# Patient Record
Sex: Female | Born: 1945 | Race: White | Hispanic: No | Marital: Married | Smoking: Never smoker
Health system: Southern US, Community
[De-identification: ages and names within clinical notes are randomized; demographics above are authoritative.]

## PROBLEM LIST (undated history)

## (undated) DIAGNOSIS — G8929 Other chronic pain: Secondary | ICD-10-CM

## (undated) DIAGNOSIS — E785 Hyperlipidemia, unspecified: Secondary | ICD-10-CM

## (undated) DIAGNOSIS — M549 Dorsalgia, unspecified: Secondary | ICD-10-CM

## (undated) DIAGNOSIS — E119 Type 2 diabetes mellitus without complications: Secondary | ICD-10-CM

## (undated) DIAGNOSIS — F319 Bipolar disorder, unspecified: Secondary | ICD-10-CM

## (undated) DIAGNOSIS — I10 Essential (primary) hypertension: Secondary | ICD-10-CM

## (undated) DIAGNOSIS — H269 Unspecified cataract: Secondary | ICD-10-CM

## (undated) DIAGNOSIS — M810 Age-related osteoporosis without current pathological fracture: Secondary | ICD-10-CM

## (undated) DIAGNOSIS — H409 Unspecified glaucoma: Secondary | ICD-10-CM

## (undated) DIAGNOSIS — E039 Hypothyroidism, unspecified: Secondary | ICD-10-CM

## (undated) DIAGNOSIS — D649 Anemia, unspecified: Secondary | ICD-10-CM

## (undated) HISTORY — DX: Essential (primary) hypertension: I10

## (undated) HISTORY — DX: Anemia, unspecified: D64.9

## (undated) HISTORY — DX: Type 2 diabetes mellitus without complications: E11.9

## (undated) HISTORY — DX: Unspecified cataract: H26.9

## (undated) HISTORY — DX: Hypothyroidism, unspecified: E03.9

## (undated) HISTORY — DX: Other chronic pain: G89.29

## (undated) HISTORY — DX: Hyperlipidemia, unspecified: E78.5

## (undated) HISTORY — DX: Dorsalgia, unspecified: M54.9

## (undated) HISTORY — PX: DILATION AND CURETTAGE OF UTERUS: SHX78

## (undated) HISTORY — PX: BREAST SURGERY: SHX581

## (undated) HISTORY — DX: Age-related osteoporosis without current pathological fracture: M81.0

## (undated) HISTORY — DX: Unspecified glaucoma: H40.9

## (undated) HISTORY — PX: KNEE ARTHROSCOPY: SUR90

## (undated) HISTORY — DX: Bipolar disorder, unspecified: F31.9

---

## 1997-12-30 ENCOUNTER — Other Ambulatory Visit: Admission: RE | Admit: 1997-12-30 | Discharge: 1997-12-30 | Payer: Self-pay

## 1998-07-18 ENCOUNTER — Inpatient Hospital Stay (HOSPITAL_COMMUNITY): Admission: AD | Admit: 1998-07-18 | Discharge: 1998-07-20 | Payer: Self-pay | Admitting: Internal Medicine

## 1998-07-19 ENCOUNTER — Encounter (HOSPITAL_BASED_OUTPATIENT_CLINIC_OR_DEPARTMENT_OTHER): Payer: Self-pay | Admitting: Internal Medicine

## 1998-07-20 ENCOUNTER — Encounter (HOSPITAL_BASED_OUTPATIENT_CLINIC_OR_DEPARTMENT_OTHER): Payer: Self-pay | Admitting: Internal Medicine

## 2000-06-18 ENCOUNTER — Other Ambulatory Visit: Admission: RE | Admit: 2000-06-18 | Discharge: 2000-06-18 | Payer: Self-pay | Admitting: *Deleted

## 2002-01-19 ENCOUNTER — Encounter: Payer: Self-pay | Admitting: Internal Medicine

## 2002-01-19 ENCOUNTER — Ambulatory Visit (HOSPITAL_COMMUNITY): Admission: RE | Admit: 2002-01-19 | Discharge: 2002-01-19 | Payer: Self-pay | Admitting: Internal Medicine

## 2002-01-26 ENCOUNTER — Ambulatory Visit (HOSPITAL_COMMUNITY): Admission: RE | Admit: 2002-01-26 | Discharge: 2002-01-26 | Payer: Self-pay | Admitting: Internal Medicine

## 2002-01-26 ENCOUNTER — Encounter: Payer: Self-pay | Admitting: Internal Medicine

## 2002-07-07 ENCOUNTER — Other Ambulatory Visit: Admission: RE | Admit: 2002-07-07 | Discharge: 2002-07-07 | Payer: Self-pay | Admitting: Pulmonary Disease

## 2002-07-07 ENCOUNTER — Ambulatory Visit (HOSPITAL_COMMUNITY): Admission: RE | Admit: 2002-07-07 | Discharge: 2002-07-07 | Payer: Self-pay | Admitting: Internal Medicine

## 2002-07-07 ENCOUNTER — Encounter: Payer: Self-pay | Admitting: Internal Medicine

## 2004-04-29 ENCOUNTER — Ambulatory Visit (HOSPITAL_COMMUNITY): Admission: RE | Admit: 2004-04-29 | Discharge: 2004-04-29 | Payer: Self-pay | Admitting: Orthopedic Surgery

## 2004-07-20 ENCOUNTER — Other Ambulatory Visit: Admission: RE | Admit: 2004-07-20 | Discharge: 2004-07-20 | Payer: Self-pay | Admitting: Internal Medicine

## 2005-07-23 HISTORY — PX: BACK SURGERY: SHX140

## 2005-07-31 ENCOUNTER — Other Ambulatory Visit: Admission: RE | Admit: 2005-07-31 | Discharge: 2005-07-31 | Payer: Self-pay | Admitting: Internal Medicine

## 2006-03-18 ENCOUNTER — Encounter: Admission: RE | Admit: 2006-03-18 | Discharge: 2006-03-18 | Payer: Self-pay | Admitting: Orthopaedic Surgery

## 2006-03-29 ENCOUNTER — Ambulatory Visit (HOSPITAL_COMMUNITY): Admission: RE | Admit: 2006-03-29 | Discharge: 2006-03-30 | Payer: Self-pay | Admitting: Orthopaedic Surgery

## 2006-11-13 ENCOUNTER — Encounter: Admission: RE | Admit: 2006-11-13 | Discharge: 2006-11-13 | Payer: Self-pay | Admitting: Orthopaedic Surgery

## 2006-11-20 ENCOUNTER — Encounter: Admission: RE | Admit: 2006-11-20 | Discharge: 2006-11-20 | Payer: Self-pay | Admitting: Orthopaedic Surgery

## 2006-12-17 ENCOUNTER — Encounter: Admission: RE | Admit: 2006-12-17 | Discharge: 2006-12-17 | Payer: Self-pay | Admitting: Orthopaedic Surgery

## 2007-01-29 ENCOUNTER — Encounter: Admission: RE | Admit: 2007-01-29 | Discharge: 2007-01-29 | Payer: Self-pay | Admitting: Orthopaedic Surgery

## 2007-07-24 HISTORY — PX: LUMBAR FUSION: SHX111

## 2007-08-11 ENCOUNTER — Encounter: Admission: RE | Admit: 2007-08-11 | Discharge: 2007-08-11 | Payer: Self-pay | Admitting: Internal Medicine

## 2007-11-25 ENCOUNTER — Ambulatory Visit: Payer: Self-pay

## 2007-11-26 ENCOUNTER — Ambulatory Visit: Payer: Self-pay

## 2007-12-05 ENCOUNTER — Inpatient Hospital Stay (HOSPITAL_COMMUNITY): Admission: RE | Admit: 2007-12-05 | Discharge: 2007-12-11 | Payer: Self-pay | Admitting: Neurosurgery

## 2010-08-13 ENCOUNTER — Encounter: Payer: Self-pay | Admitting: Orthopaedic Surgery

## 2010-12-05 NOTE — Discharge Summary (Signed)
[name], [name]                ACCOUNT NO.:  [id number]   MEDICAL RECORD NO.:  [id number]          PATIENT TYPE:  INP   LOCATION:  3011                         FACILITY:  [facility]   PHYSICIAN:  Hewitt Shorts, M.D.DATE OF BIRTH:  05/11/1946   DATE OF ADMISSION:  12/05/2007  DATE OF DISCHARGE:  12/11/2007                               DISCHARGE SUMMARY   ADMISSION HISTORY AND PHYSICAL EXAMINATION:  The patient is a 65-year-  old woman, patient of Dr. Trey Sailors, who had a right L4-L5 diskectomy in  September 2007.  She has had ongoing difficulty with pain and  discomfort, which is treated extensively nonsurgically.  He tried  epidural steroids which did not give her relief.  An MRI showed a  recurrent L4-L5 disk herniation and he admitted her for a lumbar fusion.  He notes a history of diabetes, hypertension, hypercholesteremia and  further details of her admission history are included in his admission  note.  Examination showed clear lungs.  Heart with a regular rate and  rhythm.  Neurological examination showed 5/5 strength.   HOSPITAL COURSE:  The patient was admitted by Dr. Channing Mutters.  He performed a  L4-L5 laminectomy, diskectomy, posterior lumbar interbody fusion with  fusion cages and posterolateral arthrodesis.   Postoperatively, she initially had quite a bit of pain and discomfort  with significant immobility.  Physical therapy and occupational therapy  consultations were obtained and the patient at times had excessive  sedation from the morphine TCA and we eventually changed her to Percocet  and she has done better with that and she has made steady progress with  the physical therapies and occupational therapies in terms of mobility  and ambulation as well as transfers.  The wound has had some mild serous  drainage and has required dressing changes at least every day and  occasionally more than once a day.  However, she has had no fever and  the drainage is nonpurulent and the  wound shows no erythema.  Her  sutures are intact.   The patient's husband had some concerns about her discharge to home, and  we discussed this strongly with the patient and her husband and  discussed the alternatives of a skilled nursing facility versus  discharge to home with home health PT/OT and nursing and the patient  clearly wishes to return home.  Therefore, we will request home health  PT/OT and nursing care for dressing changes.  The patient's husband has  been instructed in dressing changes and the patient and her husband have  been given instructions about wound care and activities.  We do not want  her ambulating outside in the fresh air each day.   We have arranged for an extra-wide rolling walker as well as a three-in-  one.  We have instructed them regarding showers and she is to return in  5-6 days for suture removal with Dr. Channing Mutters.   She has been given prescriptions for Percocet 1-2 tablets p.o. q.4-6 h.  p.r.n. pain, #60 tablets, no refills and gabapentin 300 mg capsules 2  b.i.d.  We have  prescribed 120 capsules and 3 refills.   DISCHARGE DIAGNOSES:  1. Recurrent lumbar disk herniation.  2. Lumbar radiculopathy.      Hewitt Shorts, M.D.  Electronically Signed     RWN/MEDQ  D:  12/11/2007  T:  12/12/2007  Job:  [id number]

## 2010-12-05 NOTE — H&P (Signed)
[name], Alyssa [name]                ACCOUNT NO.:  [id number]   MEDICAL RECORD NO.:  [id number]          PATIENT TYPE:  INP   LOCATION:  NA                           FACILITY:  [facility]   PHYSICIAN:  Payton Doughty, M.D.      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   BODY:  This is a 65 year old right-hand white girl underwent a  discectomy on the right side of L4-L5 in September 2007.  Postoperatively, she said she had pain very shortly thereafter had been  dealing with that past 18 months.  She takes hydrocodone, oxycodone,  Ultram, Lidoderm patches, Darvocet, fentanyl patches, or oral release  morphine.  She has very  poor tolerance to these and did not feel they  are helpful.  Epidural steroids did not help.  Her MRI she had a disk  recurrence at L4-L5 and she is admitted for lumbar fusion.   PAST MEDICAL HISTORY:  Remarkable for diabetes.   MEDICATIONS:  She takes insulin 60 units at night, Benicar 40 mg a day,  Norvasc 5 mg in the morning, Lasix, Crestor, Actonel, Neurontin,  potassium, multivitamins, and Tylenol Arthritis.   ALLERGIES:  None.   PAST SURGICAL HISTORY:  She has had knee operation, Dr. Thurston Hole in the  past, colonoscopy Dr. Juanda Chance.  She has had a back operation in the past  as noted.   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  She is 5 feet 5 and weighs 290 pounds.   REVIEW OF SYSTEMS:  Remarkable for hypertension, swelling of her hands  and feet.  Difficult with urination, arm weakness, back pain, arm pain,  leg pain, and joint pain.  Difficulty with memory, inability to  concentrate, and diabetes.   FAMILY HISTORY:  Unavailable.   PHYSICAL EXAMINATION:  HEENT:  Within normal limit.  NECK:  She has good range of motion in the neck.  CHEST:  Clear.  CARDIAC:  Regular rate  and rhythm.  ABDOMEN:  Large, but nontender with no hepatosplenomegaly.  EXTREMITIES:  Without clubbing or cyanosis.  GU:  Deferred.  Peripheral pulses are good.  NEUROLOGIC:  She is awake, alert, and oriented.  Cranial nerves are  intact.  Motor exam she has 5/5 strength throughout the upper and lower  extremities.  She has positive straight leg raise on the right.  She is  able to flex in lower extremities.   MRI shows laminectomy defect at L4-L5 with disk recurrence on the right  and effacement of the neuroforamen.   CLINICAL IMPRESSION:  Right L5 radiculopathy related disk.   PLAN:  Plan is for discectomy and fusion.  The risks and benefits have  been discussed with her and she wish to proceed.           ______________________________  Payton Doughty, M.D.     MWR/MEDQ  D:  12/05/2007  T:  12/05/2007  Job:  [id number]

## 2010-12-05 NOTE — Op Note (Signed)
NAMEMATTINGLY, FOUNTAINE                ACCOUNT NO.:  [id number]   MEDICAL RECORD NO.:  [id number]          PATIENT TYPE:  INP   LOCATION:  3011                         FACILITY:  [facility]   PHYSICIAN:  Payton Doughty, M.D.      DATE OF BIRTH:  [birth year]   DATE OF PROCEDURE:  12/05/2007  DATE OF DISCHARGE:  12/05/2007                               OPERATIVE REPORT   PREOPERATIVE DIAGNOSIS:  Recurrent herniated disk on the right side, L4-  L5.   POSTOPERATIVE DIAGNOSIS:  Recurrent herniated disk on the right side, L4-  L5.   OPERATIVE PROCEDURES:  1. L4-L5 laminectomy.  2. Discectomy.  3. Posterior lumbar fusion, right-sided fusion cages.  4. Posterolateral arthrodesis at L4.   SURGEON:  Payton Doughty, MD   ANESTHESIA:  General endotracheal.   PREPARATION:  Prepped and draped with alcohol wipe.   COMPLICATIONS:  None.   NURSE ASSISTANT:  [place].   DOCTOR ASSISTANT:  Hewitt Shorts, MD   BODY OF TEXT:  This 65 year old girl with recurrent disk on the right  side at L4-L5, taken to the operating room, smoothly anesthetized and  intubated, placed prone on the operating table.  Following shave, prep,  and drape in the usual sterile fashion, the skin was incised from the  bottom of L3 to the middle of L5 and the lamina of L4 and the transverse  process of L4-L5 were exposed bilaterally in subperiosteal plane.  Intraoperative x-ray confirmed correctness of the level.  The old  operative site was dissected free and there was found to be very little  laminectomy defect.  The pars reticularis, remaining lamina inferior  facet of L4 and the superior facet of L5 were removed using the high-  speed drill.  The nerve roots were dissected free.  On the right side,  there was some scarring and a fairly prominent herniated disk underneath  the right L5 root.  This was dissected without difficulty and disk  removed.  The left side was more normal anatomy.  Following complete  decompression  and __________ on both sides, right-sided fusion cages 14  x 21 mm were placed.  They were positioned to fit on the posterior  cortex of the vertebral bodies.  They were packed with bone graft  harvested from the facet joints.  Good purchase was obtained.  It was  not felt pedicle screws were necessary, and did not want to contribute  to development of adjacent segment disease.  The transverse processes of  L4-L5 were decorticated with the high-speed drill and packed with BMP on  the extender  matrix.  Intraoperative x-ray showed good placement of cages.  Successive layers of 0 Vicryl, 2-0 Vicryl, and 3-0 nylon were used to  close.  Betadine and Telfa dressing was applied and made occlusive with  OpSite.  The patient returned to the recovery room in good condition.           ______________________________  Payton Doughty, M.D.     MWR/MEDQ  D:  12/05/2007  T:  12/06/2007  Job:  [id number]

## 2010-12-08 NOTE — Op Note (Signed)
Alyssa [name], Alyssa [name]                ACCOUNT NO.:  [id number]   MEDICAL RECORD NO.:  [id number]          PATIENT TYPE:  OIB   LOCATION:  2550                         FACILITY:  [facility]   PHYSICIAN:  Mark C. Ophelia Charter, M.D.    DATE OF BIRTH:  May 18, 1946   DATE OF PROCEDURE:  03/29/2006  DATE OF DISCHARGE:                                 OPERATIVE REPORT   PREOPERATIVE DIAGNOSIS:  Right L4-5 microdiskectomy.   POSTOPERATIVE DIAGNOSIS:  Right L4-5 microdiskectomy.   PROCEDURES:  Right L4-5 microdiskectomy; L3-4 foraminotomy, right.   SURGEON:  Mark C. Ophelia Charter, M.D.   ASSISTANT:  Wende Neighbors, P.A.-C.   ANESTHESIA:  GOT.   ESTIMATED BLOOD LOSS:  Minimal.   SKIN CLOSURE:  Subcuticular.   INDICATIONS FOR PROCEDURE:  This 65 year old female, who weighs greater than  340 pounds, has had right paracentral L4-5 HNP with radiculopathy,  excruciating pain and difficulty ambulating.   DESCRIPTION OF PROCEDURE:  Because of her extremely large size, she was  placed on chest rolls, and there was difficulty keeping her on one single  table, since she tended to roll either to the right or to the left,  depending on where the panniculus was located.  After she was secured as  best as possible with extra straps, the back was prepped with DuraPrep and  the patient was reverse trendelenburged some to help with pulmonary  insufflation.  Under general anesthesia, preoperative antibiotics had been  given, and the back was prepped with DuraPrep, the area squared with towels,  and Betadine and Vi-Drape applied.  Laminectomy sheet.  Needle localization  was not used due to the patient's large size.  An incision was made at the  expected 4-5 level, and a Kocher was placed down on the lamina and confirmed  with a cross-table lateral x-ray.  The incision depth was somewhat between 8  to 12 inches, and the extra-extra-deep Taylor retractor was used.  The  Kocher appeared to be just above the disk space, and  the expected level was  exposed.  Laminotomy was performed.  Transverse ligament was removed.  Nerve  root was gently retracted on the right side and the disk did not look as  expected.  A stab incision was made.  Some passes were made.  Minimal disk  material.  A #4 Penfield was placed into the disk.  A cross-table lateral x-  ray was taken and showed this was the 3-4 level.  The Taylor retractor was  moved down 1 level.  Laminotomy was performed at the 4-5 level on the right.  The pedicle was identified with the nerve root sweeping out underneath the  pedicle, and then the central disk protrusion slightly eccentric to the  right was identified.  Annulus was incised.  Chunks of disk were removed and  fragments had to be pushed with an Epstein back down, and then grasped and  removed centrally.  The patient had narrow intra-articular distance, and  there was some hypertrophic ligament in the gutter due to the patient's  large size and degeneration of the disk.  Once the fragment was  decompressed, a hockey stick could be swept anteriorly 180 degrees in front  of the dura with no areas of compression, with the foramina enlarged and  some trimming back of the overhanging facets back out to the level of the  pedicle.  The disk space was irrigated.  The other level was irrigated as  well.  The expected operative findings at 4-5 were seen, as expected.  The  fascia was closed with 0 Vicryl, 2-0 Vicryl in the subcutaneous tissue, and  then skin closure, postoperative dressing.  The patient was transferred to  the recovery room, and due to her large size, once she arrived in the  recovery room, she had a brief anoxic period, or better described as a short  few seconds where she stopped breathing.  CRNA at the bedside, with  appropriate rapid treatment.  The patient was in stable condition.  Pulse ox  levels were good, and instrument count and needle count were correct.      Mark C. Ophelia Charter,  M.D.  Electronically Signed     MCY/MEDQ  D:  03/29/2006  T:  03/29/2006  Job:  [id number]

## 2011-04-18 LAB — GLUCOSE, RANDOM: Glucose, Bld: 359 — ABNORMAL HIGH

## 2011-04-18 LAB — POCT I-STAT 4, (NA,K, GLUC, HGB,HCT)
HCT: 31 — ABNORMAL LOW
Operator id: 147011
Potassium: 3.4 — ABNORMAL LOW
Sodium: 142

## 2011-04-18 LAB — BASIC METABOLIC PANEL
BUN: 31 — ABNORMAL HIGH
Calcium: 8.5
Calcium: 8.6
Chloride: 99
Creatinine, Ser: 0.99
Creatinine, Ser: 1.49 — ABNORMAL HIGH
GFR calc Af Amer: >60
GFR calc Af Amer: >60
GFR calc non Af Amer: 57 — ABNORMAL LOW
GFR calc non Af Amer: >60
Glucose, Bld: 146 — ABNORMAL HIGH
Glucose, Bld: 176 — ABNORMAL HIGH
Potassium: 3.9
Sodium: 138
Sodium: 141

## 2011-04-18 LAB — CBC
HCT: 29.7 — ABNORMAL LOW
MCV: 87.3
Platelets: 154
RDW: 14.2

## 2011-04-18 LAB — DIFFERENTIAL
Basophils Absolute: 0
Basophils Relative: 1
Eosinophils Absolute: 0.1
Eosinophils Relative: 1
Neutrophils Relative %: 72

## 2012-02-01 ENCOUNTER — Other Ambulatory Visit (HOSPITAL_COMMUNITY)
Admission: RE | Admit: 2012-02-01 | Discharge: 2012-02-01 | Disposition: A | Discharge status: Home / Self Care | Payer: Medicare Other | Source: Ambulatory Visit | Attending: Internal Medicine | Admitting: Internal Medicine

## 2012-02-01 DIAGNOSIS — Z124 Encounter for screening for malignant neoplasm of cervix: Secondary | ICD-10-CM | POA: Insufficient documentation

## 2012-05-07 ENCOUNTER — Other Ambulatory Visit: Payer: Self-pay | Admitting: Internal Medicine

## 2012-05-07 DIAGNOSIS — IMO0001 Reserved for inherently not codable concepts without codable children: Secondary | ICD-10-CM

## 2012-05-15 ENCOUNTER — Other Ambulatory Visit: Payer: Medicare Other

## 2012-06-10 ENCOUNTER — Ambulatory Visit
Admission: RE | Admit: 2012-06-10 | Discharge: 2012-06-10 | Disposition: A | Discharge status: Home / Self Care | Payer: Medicare Other | Source: Ambulatory Visit | Attending: Internal Medicine | Admitting: Internal Medicine

## 2012-09-25 ENCOUNTER — Encounter: Payer: Self-pay | Admitting: Internal Medicine

## 2012-11-26 ENCOUNTER — Other Ambulatory Visit: Payer: Self-pay | Admitting: Neurosurgery

## 2012-11-26 DIAGNOSIS — M47817 Spondylosis without myelopathy or radiculopathy, lumbosacral region: Secondary | ICD-10-CM

## 2012-11-27 ENCOUNTER — Ambulatory Visit
Admission: RE | Admit: 2012-11-27 | Discharge: 2012-11-27 | Disposition: A | Discharge status: Home / Self Care | Payer: Medicare Other | Source: Ambulatory Visit | Attending: Neurosurgery | Admitting: Neurosurgery

## 2012-11-27 DIAGNOSIS — M47817 Spondylosis without myelopathy or radiculopathy, lumbosacral region: Secondary | ICD-10-CM

## 2012-12-03 ENCOUNTER — Other Ambulatory Visit: Payer: Self-pay | Admitting: Neurosurgery

## 2012-12-03 DIAGNOSIS — M545 Low back pain: Secondary | ICD-10-CM

## 2012-12-03 DIAGNOSIS — M47816 Spondylosis without myelopathy or radiculopathy, lumbar region: Secondary | ICD-10-CM

## 2012-12-04 ENCOUNTER — Ambulatory Visit
Admission: RE | Admit: 2012-12-04 | Discharge: 2012-12-04 | Disposition: A | Discharge status: Home / Self Care | Payer: Medicare Other | Source: Ambulatory Visit | Attending: Neurosurgery | Admitting: Neurosurgery

## 2012-12-04 VITALS — BP 180/79 | HR 67

## 2012-12-04 DIAGNOSIS — M545 Low back pain: Secondary | ICD-10-CM

## 2012-12-04 DIAGNOSIS — M47816 Spondylosis without myelopathy or radiculopathy, lumbar region: Secondary | ICD-10-CM

## 2012-12-04 MED ORDER — IOHEXOL 180 MG/ML  SOLN
1.0000 mL | Freq: Once | INTRAMUSCULAR | Status: AC | PRN
  Administered 2012-12-04: 1 mL via EPIDURAL

## 2012-12-04 MED ORDER — METHYLPREDNISOLONE ACETATE 40 MG/ML INJ SUSP (RADIOLOG
120.0000 mg | Freq: Once | INTRAMUSCULAR | Status: AC
  Administered 2012-12-04: 120 mg via EPIDURAL

## 2012-12-10 ENCOUNTER — Other Ambulatory Visit: Payer: Self-pay | Admitting: Neurosurgery

## 2012-12-10 DIAGNOSIS — M545 Low back pain: Secondary | ICD-10-CM

## 2012-12-10 DIAGNOSIS — M47816 Spondylosis without myelopathy or radiculopathy, lumbar region: Secondary | ICD-10-CM

## 2012-12-19 ENCOUNTER — Ambulatory Visit
Admission: RE | Admit: 2012-12-19 | Discharge: 2012-12-19 | Disposition: A | Discharge status: Home / Self Care | Payer: Medicare Other | Source: Ambulatory Visit | Attending: Neurosurgery | Admitting: Neurosurgery

## 2012-12-19 VITALS — BP 180/77 | HR 64

## 2012-12-19 DIAGNOSIS — M47816 Spondylosis without myelopathy or radiculopathy, lumbar region: Secondary | ICD-10-CM

## 2012-12-19 DIAGNOSIS — M545 Low back pain: Secondary | ICD-10-CM

## 2012-12-19 MED ORDER — METHYLPREDNISOLONE ACETATE 40 MG/ML INJ SUSP (RADIOLOG
120.0000 mg | Freq: Once | INTRAMUSCULAR | Status: AC
  Administered 2012-12-19: 120 mg via EPIDURAL

## 2012-12-19 MED ORDER — IOHEXOL 180 MG/ML  SOLN
1.0000 mL | Freq: Once | INTRAMUSCULAR | Status: AC | PRN
  Administered 2012-12-19: 1 mL via EPIDURAL

## 2013-05-12 ENCOUNTER — Other Ambulatory Visit: Payer: Self-pay | Admitting: Internal Medicine

## 2013-05-12 DIAGNOSIS — R52 Pain, unspecified: Secondary | ICD-10-CM

## 2013-05-12 DIAGNOSIS — R109 Unspecified abdominal pain: Secondary | ICD-10-CM

## 2013-05-15 ENCOUNTER — Ambulatory Visit
Admission: RE | Admit: 2013-05-15 | Discharge: 2013-05-15 | Disposition: A | Discharge status: Home / Self Care | Payer: Medicare Other | Source: Ambulatory Visit | Attending: Internal Medicine | Admitting: Internal Medicine

## 2013-05-15 ENCOUNTER — Other Ambulatory Visit: Payer: Medicare Other

## 2013-05-15 DIAGNOSIS — R52 Pain, unspecified: Secondary | ICD-10-CM

## 2013-05-15 DIAGNOSIS — R109 Unspecified abdominal pain: Secondary | ICD-10-CM

## 2013-05-15 MED ORDER — IOHEXOL 300 MG/ML  SOLN
125.0000 mL | Freq: Once | INTRAMUSCULAR | Status: AC | PRN
  Administered 2013-05-15: 125 mL via INTRAVENOUS

## 2013-05-19 ENCOUNTER — Other Ambulatory Visit: Payer: Self-pay | Admitting: Internal Medicine

## 2013-05-19 DIAGNOSIS — R935 Abnormal findings on diagnostic imaging of other abdominal regions, including retroperitoneum: Secondary | ICD-10-CM

## 2013-05-27 ENCOUNTER — Other Ambulatory Visit: Payer: Medicare Other

## 2013-05-30 ENCOUNTER — Ambulatory Visit
Admission: RE | Admit: 2013-05-30 | Discharge: 2013-05-30 | Disposition: A | Discharge status: Home / Self Care | Payer: Medicare Other | Source: Ambulatory Visit | Attending: Internal Medicine | Admitting: Internal Medicine

## 2013-05-30 DIAGNOSIS — R935 Abnormal findings on diagnostic imaging of other abdominal regions, including retroperitoneum: Secondary | ICD-10-CM

## 2013-05-30 MED ORDER — GADOBENATE DIMEGLUMINE 529 MG/ML IV SOLN
20.0000 mL | Freq: Once | INTRAVENOUS | Status: AC | PRN
  Administered 2013-05-30: 20 mL via INTRAVENOUS

## 2013-06-16 ENCOUNTER — Other Ambulatory Visit: Payer: Self-pay | Admitting: Neurosurgery

## 2013-06-16 DIAGNOSIS — M47817 Spondylosis without myelopathy or radiculopathy, lumbosacral region: Secondary | ICD-10-CM

## 2013-06-19 ENCOUNTER — Encounter: Payer: Self-pay | Admitting: Internal Medicine

## 2013-06-19 DIAGNOSIS — F319 Bipolar disorder, unspecified: Secondary | ICD-10-CM | POA: Insufficient documentation

## 2013-06-19 DIAGNOSIS — M810 Age-related osteoporosis without current pathological fracture: Secondary | ICD-10-CM | POA: Insufficient documentation

## 2013-06-19 DIAGNOSIS — E1129 Type 2 diabetes mellitus with other diabetic kidney complication: Secondary | ICD-10-CM | POA: Insufficient documentation

## 2013-06-19 DIAGNOSIS — I10 Essential (primary) hypertension: Secondary | ICD-10-CM | POA: Insufficient documentation

## 2013-06-19 DIAGNOSIS — E785 Hyperlipidemia, unspecified: Secondary | ICD-10-CM | POA: Insufficient documentation

## 2013-06-23 ENCOUNTER — Ambulatory Visit
Admission: RE | Admit: 2013-06-23 | Discharge: 2013-06-23 | Disposition: A | Discharge status: Home / Self Care | Payer: Medicare Other | Source: Ambulatory Visit | Attending: Neurosurgery | Admitting: Neurosurgery

## 2013-06-23 ENCOUNTER — Encounter: Payer: Self-pay | Admitting: Emergency Medicine

## 2013-06-23 ENCOUNTER — Ambulatory Visit (INDEPENDENT_AMBULATORY_CARE_PROVIDER_SITE_OTHER): Payer: Medicare Other | Admitting: Emergency Medicine

## 2013-06-23 VITALS — BP 182/108 | HR 88 | Temp 97.8°F | Resp 16 | Ht 63.25 in | Wt 217.0 lb

## 2013-06-23 DIAGNOSIS — R4586 Emotional lability: Secondary | ICD-10-CM

## 2013-06-23 DIAGNOSIS — Z0000 Encounter for general adult medical examination without abnormal findings: Secondary | ICD-10-CM

## 2013-06-23 DIAGNOSIS — E559 Vitamin D deficiency, unspecified: Secondary | ICD-10-CM

## 2013-06-23 DIAGNOSIS — R5381 Other malaise: Secondary | ICD-10-CM

## 2013-06-23 DIAGNOSIS — I10 Essential (primary) hypertension: Secondary | ICD-10-CM

## 2013-06-23 DIAGNOSIS — R350 Frequency of micturition: Secondary | ICD-10-CM

## 2013-06-23 DIAGNOSIS — M47817 Spondylosis without myelopathy or radiculopathy, lumbosacral region: Secondary | ICD-10-CM

## 2013-06-23 DIAGNOSIS — E669 Obesity, unspecified: Secondary | ICD-10-CM

## 2013-06-23 DIAGNOSIS — E039 Hypothyroidism, unspecified: Secondary | ICD-10-CM

## 2013-06-23 DIAGNOSIS — E782 Mixed hyperlipidemia: Secondary | ICD-10-CM

## 2013-06-23 DIAGNOSIS — R413 Other amnesia: Secondary | ICD-10-CM

## 2013-06-23 DIAGNOSIS — E538 Deficiency of other specified B group vitamins: Secondary | ICD-10-CM

## 2013-06-23 DIAGNOSIS — E119 Type 2 diabetes mellitus without complications: Secondary | ICD-10-CM

## 2013-06-23 LAB — CBC WITH DIFFERENTIAL/PLATELET
Basophils Absolute: 0 10*3/uL (ref 0.0–0.1)
Eosinophils Absolute: 0.1 10*3/uL (ref 0.0–0.7)
Eosinophils Relative: 1 % (ref 0–5)
Lymphocytes Relative: 31 % (ref 12–46)
MCH: 27.2 pg (ref 26.0–34.0)
MCV: 84.9 fL (ref 78.0–100.0)
Neutrophils Relative %: 65 % (ref 43–77)
Platelets: 234 10*3/uL (ref 150–400)
RBC: 4.64 MIL/uL (ref 3.87–5.11)
RDW: 14.1 % (ref 11.5–15.5)
WBC: 6.3 10*3/uL (ref 4.0–10.5)

## 2013-06-23 LAB — HEPATIC FUNCTION PANEL
ALT: 11 U/L (ref 0–35)
AST: 16 U/L (ref 0–37)
Alkaline Phosphatase: 75 U/L (ref 39–117)
Bilirubin, Direct: 0.1 mg/dL (ref 0.0–0.3)
Indirect Bilirubin: 0.3 mg/dL (ref 0.0–0.9)
Total Protein: 6.4 g/dL (ref 6.0–8.3)

## 2013-06-23 LAB — BASIC METABOLIC PANEL WITH GFR
CO2: 29 mEq/L (ref 19–32)
Chloride: 101 mEq/L (ref 96–112)
Creat: 1 mg/dL (ref 0.50–1.10)
GFR, Est Non African American: 58 mL/min — ABNORMAL LOW
Sodium: 138 mEq/L (ref 135–145)

## 2013-06-23 LAB — LIPID PANEL
LDL Cholesterol: 57 mg/dL (ref 0–99)
Triglycerides: 218 mg/dL — ABNORMAL HIGH (ref <150)
VLDL: 44 mg/dL — ABNORMAL HIGH (ref 0–40)

## 2013-06-23 LAB — HEMOGLOBIN A1C: Hgb A1c MFr Bld: 10.9 % — ABNORMAL HIGH (ref <5.7)

## 2013-06-23 LAB — TSH: TSH: 1.749 u[IU]/mL (ref 0.350–4.500)

## 2013-06-23 NOTE — Patient Instructions (Signed)
Depression, Adult Depression is feeling sad, low, down in the dumps, blue, gloomy, or empty. In general, there are two kinds of depression:  Normal sadness or grief. This can happen after something upsetting. It often goes away on its own within 2 weeks. After losing a loved one (bereavement), normal sadness and grief may last longer than two weeks. It usually gets better with time.  Clinical depression. This kind lasts longer than normal sadness or grief. It keeps you from doing the things you normally do in life. It is often hard to function at home, work, or at school. It may affect your relationships with others. Treatment is often needed. GET HELP RIGHT AWAY IF:  You have thoughts about hurting yourself or others.  You lose touch with reality (psychotic symptoms). You may:  See or hear things that are not real.  Have untrue beliefs about your life or people around you.  Your medicine is giving you problems. MAKE SURE YOU:  Understand these instructions.  Will watch your condition.  Will get help right away if you are not doing well or get worse. Document Released: 08/11/2010 Document Revised: 04/02/2012 Document Reviewed: 11/08/2011 [facility] Patient Information 2014 [place], [place]. Diabetes and Exercise Exercising regularly is important. It is not just about losing weight. It has many health benefits, such as:  Improving your overall fitness, flexibility, and endurance.  Increasing your bone density.  Helping with weight control.  Decreasing your body fat.  Increasing your muscle strength.  Reducing stress and tension.  Improving your overall health. People with diabetes who exercise gain additional benefits because exercise:  Reduces appetite.  Improves the body's use of blood sugar (glucose).  Helps lower or control blood glucose.  Decreases blood pressure.  Helps control blood lipids (such as cholesterol and triglycerides).  Improves the body's use of  the hormone insulin by:  Increasing the body's insulin sensitivity.  Reducing the body's insulin needs.  Decreases the risk for heart disease because exercising:  Lowers cholesterol and triglycerides levels.  Increases the levels of good cholesterol (such as high-density lipoproteins [HDL]) in the body.  Lowers blood glucose levels. YOUR ACTIVITY PLAN  Choose an activity that you enjoy and set realistic goals. Your health care provider or diabetes educator can help you make an activity plan that works for you. You can break activities into 2 or 3 sessions throughout the day. Doing so is as good as one long session. Exercise ideas include:  Taking the dog for a walk.  Taking the stairs instead of the elevator.  Dancing to your favorite song.  Doing your favorite exercise with a friend. RECOMMENDATIONS FOR EXERCISING WITH TYPE 1 OR TYPE 2 DIABETES   Check your blood glucose before exercising. If blood glucose levels are greater than 240 mg/dL, check for urine ketones. Do not exercise if ketones are present.  Avoid injecting insulin into areas of the body that are going to be exercised. For example, avoid injecting insulin into:  The arms when playing tennis.  The legs when jogging.  Keep a record of:  Food intake before and after you exercise.  Expected peak times of insulin action.  Blood glucose levels before and after you exercise.  The type and amount of exercise you have done.  Review your records with your health care provider. Your health care provider will help you to develop guidelines for adjusting food intake and insulin amounts before and after exercising.  If you take insulin or oral hypoglycemic agents, watch  for signs and symptoms of hypoglycemia. They include:  Dizziness.  Shaking.  Sweating.  Chills.  Confusion.  Drink plenty of water while you exercise to prevent dehydration or heat stroke. Body water is lost during exercise and must be  replaced.  Talk to your health care provider before starting an exercise program to make sure it is safe for you. Remember, almost any type of activity is better than none. Document Released: 09/29/2003 Document Revised: 03/11/2013 Document Reviewed: 12/16/2012 [facility] Patient Information 2014 [place], [place]. Hypothyroidism The thyroid is a large gland located in the lower front of your neck. The thyroid gland helps control metabolism. Metabolism is how your body handles food. It controls metabolism with the hormone thyroxine. When this gland is underactive (hypothyroid), it produces too little hormone.  CAUSES These include:   Absence or destruction of thyroid tissue.  Goiter due to iodine deficiency.  Goiter due to medications.  Congenital defects (since birth).  Problems with the pituitary. This causes a lack of TSH (thyroid stimulating hormone). This hormone tells the thyroid to turn out more hormone. SYMPTOMS  Lethargy (feeling as though you have no energy)  Cold intolerance  Weight gain (in spite of normal food intake)  Dry skin  Coarse hair  Menstrual irregularity (if severe, may lead to infertility)  Slowing of thought processes Cardiac problems are also caused by insufficient amounts of thyroid hormone. Hypothyroidism in the newborn is cretinism, and is an extreme form. It is important that this form be treated adequately and immediately or it will lead rapidly to retarded physical and mental development. DIAGNOSIS  To prove hypothyroidism, your caregiver may do blood tests and ultrasound tests. Sometimes the signs are hidden. It may be necessary for your caregiver to watch this illness with blood tests either before or after diagnosis and treatment. TREATMENT  Low levels of thyroid hormone are increased by using synthetic thyroid hormone. This is a safe, effective treatment. It usually takes about four weeks to gain the full effects of the medication. After you have  the full effect of the medication, it will generally take another four weeks for problems to leave. Your caregiver may start you on low doses. If you have had heart problems the dose may be gradually increased. It is generally not an emergency to get rapidly to normal. HOME CARE INSTRUCTIONS   Take your medications as your caregiver suggests. Let your caregiver know of any medications you are taking or start taking. Your caregiver will help you with dosage schedules.  As your condition improves, your dosage needs may increase. It will be necessary to have continuing blood tests as suggested by your caregiver.  Report all suspected medication side effects to your caregiver. SEEK MEDICAL CARE IF: Seek medical care if you develop:  Sweating.  Tremulousness (tremors).  Anxiety.  Rapid weight loss.  Heat intolerance.  Emotional swings.  Diarrhea.  Weakness. SEEK IMMEDIATE MEDICAL CARE IF:  You develop chest pain, an irregular heart beat (palpitations), or a rapid heart beat. MAKE SURE YOU:   Understand these instructions.  Will watch your condition.  Will get help right away if you are not doing well or get worse. Document Released: 07/09/2005 Document Revised: 10/01/2011 Document Reviewed: 02/27/2008 [facility] Patient Information 2014 [place], [place].

## 2013-06-24 ENCOUNTER — Other Ambulatory Visit: Payer: Self-pay | Admitting: Internal Medicine

## 2013-06-24 ENCOUNTER — Other Ambulatory Visit: Payer: Self-pay

## 2013-06-24 ENCOUNTER — Encounter: Payer: Self-pay | Admitting: Internal Medicine

## 2013-06-24 DIAGNOSIS — N390 Urinary tract infection, site not specified: Secondary | ICD-10-CM

## 2013-06-24 DIAGNOSIS — I10 Essential (primary) hypertension: Secondary | ICD-10-CM

## 2013-06-24 LAB — INSULIN, FASTING: Insulin fasting, serum: 8 u[IU]/mL (ref 3–28)

## 2013-06-25 ENCOUNTER — Encounter: Payer: Self-pay | Admitting: Emergency Medicine

## 2013-06-25 ENCOUNTER — Other Ambulatory Visit: Payer: Self-pay | Admitting: Emergency Medicine

## 2013-06-25 ENCOUNTER — Telehealth: Payer: Self-pay | Admitting: Internal Medicine

## 2013-06-25 DIAGNOSIS — E559 Vitamin D deficiency, unspecified: Secondary | ICD-10-CM | POA: Insufficient documentation

## 2013-06-25 DIAGNOSIS — Z0000 Encounter for general adult medical examination without abnormal findings: Secondary | ICD-10-CM

## 2013-06-25 LAB — URINE CULTURE: Organism ID, Bacteria: NO GROWTH

## 2013-06-25 LAB — URINALYSIS, ROUTINE W REFLEX MICROSCOPIC
Bilirubin Urine: NEGATIVE
Glucose, UA: NEGATIVE mg/dL
Hgb urine dipstick: NEGATIVE
Specific Gravity, Urine: 1.011 (ref 1.005–1.030)
pH: 7.5 (ref 5.0–8.0)

## 2013-06-25 LAB — MICROALBUMIN / CREATININE URINE RATIO
Creatinine, Urine: 35.4 mg/dL
Microalb Creat Ratio: 3306.5 mg/g — ABNORMAL HIGH (ref 0.0–30.0)
Microalb, Ur: 117.05 mg/dL — ABNORMAL HIGH (ref 0.00–1.89)

## 2013-06-25 LAB — URINALYSIS, MICROSCOPIC ONLY: Squamous Epithelial / LPF: NONE SEEN

## 2013-06-25 NOTE — Telephone Encounter (Signed)
PT HAVING ISSUES ON THESE MEDS GIVEN AT CPE WITH YOU. PT NEEDS DIRECTIONS ON TAKING MEDS.  SYNTHROID   BRINTELLIX  PLEASE CALL CELL [phone]

## 2013-06-25 NOTE — Telephone Encounter (Signed)
Please call patient and tell her  Synthroid was supposed to remain the same. Glipizide 10 mg 1/2 BID Losartan 100 mg 1/2 BID Brintellix samples 1 QD Those were the only changes.

## 2013-06-25 NOTE — Progress Notes (Signed)
Subjective:    Patient ID: Alyssa [name], female    DOB: 06/21/1946, 67 y.o.   MRN: [id number]  HPI Comments: 67 yo obese WF CPE and presents for 3 month F/U for HTN, Cholesterol, Dm, D. Deficient. We had made significant changes to medications over the last several months due to low BP/ BS and fatigue. She has noticed her BP/ BS have both been higher over last month. She notes BS 200s and BPs 160-200/80-100. She denies CV symptoms except chronic right lower extremity edema since back surgery. She is overdue for her yearly eye evaluation. She is not eating healthy nor exercising due to her back.  She has noticed increased fatigue, agitation, and increased mental fogginess. She notes she is easily distracted, loses thought and is afraid she is getting dementia. She notes + stress with sick husband and chronic back pain issues. She also notes her hair is falling out excessively over last couple weeks. She denies any new exposures or stressors.  She has follow up scheduled with Dr.Roy to re-evaluate back/ leg pain. She had to discontinue PT per Dr. Channing Mutters in hopes the pain would improve with rest. She noted PT helped with flexibilty but did not improve strength or pain.    Current Outpatient Prescriptions on File Prior to Visit  Medication Sig Dispense Refill  . aspirin 81 MG tablet Take 81 mg by mouth daily.      . furosemide (LASIX) 40 MG tablet Take 40 mg by mouth. 1/2 daily      . gabapentin (NEURONTIN) 800 MG tablet Take 800 mg by mouth 2 (two) times daily.      Marland Kitchen levothyroxine (SYNTHROID, LEVOTHROID) 50 MCG tablet Take 50 mcg by mouth daily before breakfast.      . losartan (COZAAR) 100 MG tablet Take 100 mg by mouth daily. 1/2 daily = 50 mg      . metFORMIN (GLUCOPHAGE) 500 MG tablet Take 1,000 mg by mouth 2 (two) times daily with a meal.       . Probiotic Product (PROBIOTIC DAILY PO) Take by mouth daily.      . rosuvastatin (CRESTOR) 5 MG tablet Take 5 mg by mouth daily.      . traZODone  (DESYREL) 150 MG tablet Take by mouth at bedtime.      . meloxicam (MOBIC) 15 MG tablet Take 15 mg by mouth daily.       No current facility-administered medications on file prior to visit.   ALLERGIES Ciprofloxacin hcl; Codeine; Cymbalta; Lortab; and Oxycodone  Past Medical History  Diagnosis Date  . Osteopenia   . Osteoporosis   . Diabetes mellitus without complication   . Hypertension   . Bipolar 1 disorder   . Hyperlipidemia   . Anemia   . Cataract    Past Surgical History  Procedure Laterality Date  . Spine surgery    . Knee arthroscopy Bilateral   . Dilation and curettage of uterus    . Breast surgery      CYST REMOVAL    Patient is Adopted no [facility] available.  Review of Systems  Constitutional: Positive for fatigue.  Cardiovascular: Positive for leg swelling.  Endocrine:       Hair loss  Musculoskeletal: Positive for arthralgias and back pain.  Psychiatric/Behavioral: Positive for confusion and agitation. Negative for suicidal ideas.  All other systems reviewed and are negative.    BP 182/108  Pulse 88  Temp(Src) 97.8 F (36.6 C) (Temporal)  Resp 16  Ht 5' 3.25" (1.607 m)  Wt 217 lb (98.431 kg)  BMI 38.12 kg/m2  Recheck 179/103 155/98    Objective:   Physical Exam  Nursing note and vitals reviewed. Constitutional: She is oriented to person, place, and time. She appears well-developed and well-nourished. No distress.  Obese  HENT:  Head: Normocephalic and atraumatic.  Right Ear: External ear normal.  Left Ear: External ear normal.  Nose: Nose normal.  Mouth/Throat: Oropharynx is clear and moist.  Eyes: Conjunctivae and EOM are normal. Pupils are equal, round, and reactive to light. No scleral icterus.  Neck: Normal range of motion. Neck supple. No JVD present. No tracheal deviation present. No thyromegaly present.  Cardiovascular: Normal rate, regular rhythm, normal heart sounds and intact distal pulses.   Pulmonary/Chest: Effort normal and breath  sounds normal.  Abdominal: Soft. Bowel sounds are normal. She exhibits no distension and no mass. There is no tenderness. There is no rebound and no guarding.  Genitourinary:  Def to GYN  Musculoskeletal: Normal range of motion. She exhibits no edema and no tenderness.  Lymphadenopathy:    She has no cervical adenopathy.  Neurological: She is alert and oriented to person, place, and time. She has normal reflexes. No cranial nerve deficit. She exhibits normal muscle tone. Coordination normal.  Skin: Skin is warm and dry. No rash noted. No erythema. No pallor.  Psychiatric: She has a normal mood and affect. Her behavior is normal. Judgment and thought content normal.      EKG Unable to obtain proper tracing due to inability to lie flat and body habitus. Appears to have irbbb V1 V2    Assessment & Plan:  1. CPE and presents for 3 month F/U for HTN, Cholesterol, DM, D. Deficient, Obesity. Check labs. Needs better diet, cardio QD, and weight loss. Restart Glipizide 10 mg 1/2 BID. Increase losartan to 1/2 BID. Check BS and BP call with results 1 week if any CV complaints ER. Due for her female/ gyn eval with her size will send to GYN. Advised needs Eye eval yearly. 2. Fatigue/ mood changes/ memory decline vs ADD- Trial of Brintellix 10 md AD sx #28 given, Take with food, w/c if symptoms increase. If SI/ HI ER 3. Hair loss check labs, increase protein, decrease stress 4. Chronic back pain and Right LE Edema- keep Ortho/ Neuro follow up, try chair exercises or water walking. Elevate legs 10 min every 2-3 hours.

## 2013-06-26 ENCOUNTER — Telehealth: Payer: Self-pay | Admitting: *Deleted

## 2013-06-26 ENCOUNTER — Encounter: Payer: Self-pay | Admitting: *Deleted

## 2013-06-26 NOTE — Telephone Encounter (Signed)
Needs results asap & directions on how to start the new medication.

## 2013-06-26 NOTE — Telephone Encounter (Signed)
Spoke with patient about recent Rx instructions.. Advised patient I emailed her instructions as well.   Please call patient and tell her  Synthroid was supposed to remain the same.  Glipizide 10 mg 1/2 BID  Losartan 100 mg 1/2 BID  Brintellix samples 1 QD  Those were the only changes.

## 2013-06-28 ENCOUNTER — Other Ambulatory Visit: Payer: Self-pay | Admitting: Internal Medicine

## 2013-06-30 ENCOUNTER — Encounter: Payer: Self-pay | Admitting: Emergency Medicine

## 2013-06-30 ENCOUNTER — Encounter: Payer: Self-pay | Admitting: Internal Medicine

## 2013-07-03 ENCOUNTER — Encounter: Payer: Self-pay | Admitting: Emergency Medicine

## 2013-07-08 ENCOUNTER — Encounter: Payer: Self-pay | Admitting: Emergency Medicine

## 2013-07-13 ENCOUNTER — Other Ambulatory Visit: Payer: Self-pay | Admitting: Internal Medicine

## 2013-07-14 ENCOUNTER — Other Ambulatory Visit: Payer: Self-pay | Admitting: Emergency Medicine

## 2013-07-15 ENCOUNTER — Ambulatory Visit (INDEPENDENT_AMBULATORY_CARE_PROVIDER_SITE_OTHER): Payer: Medicare Other | Admitting: Internal Medicine

## 2013-07-15 ENCOUNTER — Ambulatory Visit: Payer: Self-pay | Admitting: Internal Medicine

## 2013-07-15 ENCOUNTER — Encounter: Payer: Self-pay | Admitting: Internal Medicine

## 2013-07-15 VITALS — BP 166/92 | HR 60 | Temp 97.9°F | Resp 16 | Wt 210.0 lb

## 2013-07-15 DIAGNOSIS — E039 Hypothyroidism, unspecified: Secondary | ICD-10-CM

## 2013-07-15 DIAGNOSIS — I10 Essential (primary) hypertension: Secondary | ICD-10-CM

## 2013-07-15 DIAGNOSIS — F329 Major depressive disorder, single episode, unspecified: Secondary | ICD-10-CM

## 2013-07-15 DIAGNOSIS — F324 Major depressive disorder, single episode, in partial remission: Secondary | ICD-10-CM | POA: Insufficient documentation

## 2013-07-15 MED ORDER — PHENTERMINE HCL 37.5 MG PO TABS: ORAL_TABLET | ORAL | Status: DC

## 2013-07-15 MED ORDER — LEVOTHYROXINE SODIUM 50 MCG PO TABS: 50.0000 ug | ORAL_TABLET | Freq: Every day | ORAL | Status: DC

## 2013-07-15 MED ORDER — MINOXIDIL 10 MG PO TABS: 10.0000 mg | ORAL_TABLET | Freq: Every day | ORAL | Status: DC

## 2013-07-15 NOTE — Patient Instructions (Signed)
Start Minoxidil 10 mg tablets at 1/4 tablet twice daily   If after 5 days Systolic BPs still over   Then  Increase to 1/2 tablet twice daily    Hypertension As your heart beats, it forces blood through your arteries. This force is your blood pressure. If the pressure is too high, it is called hypertension (HTN) or high blood pressure. HTN is dangerous because you may have it and not know it. High blood pressure may mean that your heart has to work harder to pump blood. Your arteries may be narrow or stiff. The extra work puts you at risk for heart disease, stroke, and other problems.  Blood pressure consists of two numbers, a higher number over a lower, 110/72, for example. It is stated as "110 over 72." The ideal is below 120 for the top number (systolic) and under 80 for the bottom (diastolic). Write down your blood pressure today. You should pay close attention to your blood pressure if you have certain conditions such as:  Heart failure.  Prior heart attack.  Diabetes  Chronic kidney disease.  Prior stroke.  Multiple risk factors for heart disease. To see if you have HTN, your blood pressure should be measured while you are seated with your arm held at the level of the heart. It should be measured at least twice. A one-time elevated blood pressure reading (especially in the Emergency Department) does not mean that you need treatment. There may be conditions in which the blood pressure is different between your right and left arms. It is important to see your caregiver soon for a recheck. Most people have essential hypertension which means that there is not a specific cause. This type of high blood pressure may be lowered by changing lifestyle factors such as:  Stress.  Smoking.  Lack of exercise.  Excessive weight.  Drug/tobacco/alcohol use.  Eating less salt. Most people do not have symptoms from high blood pressure until it has caused damage to the body. Effective  treatment can often prevent, delay or reduce that damage. TREATMENT  When a cause has been identified, treatment for high blood pressure is directed at the cause. There are a large number of medications to treat HTN. These fall into several categories, and your caregiver will help you select the medicines that are best for you. Medications may have side effects. You should review side effects with your caregiver. If your blood pressure stays high after you have made lifestyle changes or started on medicines,   Your medication(s) may need to be changed.  Other problems may need to be addressed.  Be certain you understand your prescriptions, and know how and when to take your medicine.  Be sure to follow up with your caregiver within the time frame advised (usually within two weeks) to have your blood pressure rechecked and to review your medications.  If you are taking more than one medicine to lower your blood pressure, make sure you know how and at what times they should be taken. Taking two medicines at the same time can result in blood pressure that is too low. SEEK IMMEDIATE MEDICAL CARE IF:  You develop a severe headache, blurred or changing vision, or confusion.  You have unusual weakness or numbness, or a faint feeling.  You have severe chest or abdominal pain, vomiting, or breathing problems. MAKE SURE YOU:   Understand these instructions.  Will watch your condition.  Will get help right away if you are not doing well or  get worse. Document Released: 07/09/2005 Document Revised: 10/01/2011 Document Reviewed: 02/27/2008 [facility] Patient Information 2014 [place], [place].  Type 1 Diabetes Mellitus, Adult Type 1 diabetes mellitus, often simply referred to as diabetes, is a long-term (chronic) disease. It occurs when the islet cells in the pancreas that make insulin (a hormone) are destroyed and can no longer make insulin. Insulin is needed to move sugars from food into the tissue  cells. The tissue cells use the sugars for energy. In people with type 1 diabetes, the sugars build up in the blood instead of going into the tissue cells. As a result, high blood sugar (hyperglycemia) develops. Without insulin, the body breaks down fat cells for the needed energy. This breakdown of fat cells produces acid chemicals (ketones), which increases the acid levels in the body. The effect of either high ketone or sugar (glucose) levels can be life-threatening.  Type 1 diabetes was also previously called juvenile diabetes. It most often occurs before the age of 58, but it can occur at any age. RISK FACTORS A person is predisposed to developing type 1 diabetes if someone in his or her family has the disease and is exposed to certain additional environmental triggers.  SYMPTOMS  Symptoms of type 1 diabetes may develop gradually over days to weeks or suddenly. The symptoms occur due to hyperglycemia. The symptoms can include:   Increased thirst (polydipsia).  Increased urination (polyuria).  Increased urination during the night (nocturia).  Weight loss. This weight loss may be rapid.  Frequent, recurring infections.  Tiredness (fatigue).  Weakness.  Vision changes, such as blurred vision.  Fruity smell to your breath.  Abdominal pain.  Nausea or vomiting. DIAGNOSIS  Type 1 diabetes is diagnosed when symptoms of diabetes are present and when blood glucose levels are increased. Your blood glucose level may be checked by one or more of the following blood tests:  A fasting blood glucose test. You will not be allowed to eat for at least 8 hours before a blood sample is taken.  A random blood glucose test. Your blood glucose is checked at any time of the day regardless of when you ate.  A hemoglobin A1c blood glucose test. A hemoglobin A1c test provides information about blood glucose control over the previous 3 months. TREATMENT  Although type 1 diabetes cannot be prevented, it  can be managed with insulin, diet, and exercise.  You will need to take insulin daily to keep blood glucose in the desired range.  You will need to match insulin dosing with exercise and healthy food choices. The treatment goal is to maintain the before-meal blood sugar (preprandial glucose) level at 70 130 mg/dL.  HOME CARE INSTRUCTIONS   Have your hemoglobin A1c level checked twice a year.  Perform daily blood glucose monitoring as directed by your caregiver.  Monitor urine ketones when you are ill and as directed by your caregiver.  Take your insulin as directed by your caregiver to maintain your blood glucose level in the desired range.  Never run out of insulin. It is needed every day.  Adjust insulin based on your intake of carbohydrates. Carbohydrates can raise blood glucose levels but need to be included in your diet. Carbohydrates provide vitamins, minerals, and fiber, which are an essential part of a healthy diet. Carbohydrates are found in fruits, vegetables, whole grains, dairy products, legumes, and foods containing added sugars.    Eat healthy foods. Alternate 3 meals with 3 snacks.  Maintain a healthy weight.  Carry  a medical alert card or wear your medical alert jewelry.  Carry a 15 gram carbohydrate snack with you at all times to treat low blood glucose (hypoglycemia). Some examples of 15 gram carbohydrate snacks include:  Glucose tablets, 3 or 4.   Glucose gel, 15 gram tube.  Raisins, 2 tablespoons (24 grams).  Jelly beans, 6.  Animal crackers, 8.  Fruit juice, regular soda, or low-fat milk, 4 ounces (120 mL).  Gummy treats, 9.    Recognize hypoglycemia. Hypoglycemia occurs with blood glucose levels of 70 mg/dL and below. The risk for hypoglycemia increases when fasting or skipping meals, during or after intense exercise, and during sleep. Hypoglycemia symptoms can include:  Tremors or shakes.  Decreased ability to  concentrate.  Sweating.  Increased heart rate.  Headache.  Dry mouth.  Hunger.  Irritability.  Anxiety.  Restless sleep.  Altered speech or coordination.  Confusion.  Treat hypoglycemia promptly. If you are alert and able to safely swallow, follow the 15:15 rule:  Take 15 20 grams of rapid-acting glucose or carbohydrate. Rapid-acting options include glucose gel, glucose tablets, or 4 ounces (120 mL) of fruit juice, regular soda, or low-fat milk.  Check your blood glucose level 15 minutes after taking the glucose.   Take 15 20 grams more of glucose if the repeat blood glucose level is still 70 mg/dL or below.  Eat a meal or snack within 1 hour once blood glucose levels return to normal.  Be alert to polyuria and polydipsia, which are early signs of hyperglycemia. An early awareness of hyperglycemia allows for prompt treatment. Treat hyperglycemia as directed by your caregiver.  Engage in at least 150 minutes of moderate-intensity physical activity a week, spread over at least 3 days of the week or as directed by your caregiver.  Adjust your insulin dosing and food intake as needed if you start a new exercise or sport.  Follow your sick day plan at any time you are unable to eat or drink as usual.   Avoid tobacco use.  Limit alcohol intake to no more than 1 drink per day for nonpregnant women and 2 drinks per day for men. You should drink alcohol only when you are also eating food. Talk with your caregiver about whether alcohol is safe for you. Tell your caregiver if you drink alcohol several times a week.  Follow up with your caregiver regularly.  Schedule an eye exam within 5 years of diagnosis and then annually.  Perform daily skin and foot care. Examine your skin and feet daily for cuts, bruises, redness, nail problems, bleeding, blisters, or sores. A foot exam by a caregiver should be done annually.  Brush your teeth and gums at least twice a day and floss at  least once a day. Follow up with your dentist regularly.  Share your diabetes management plan with your workplace or school.  Stay up-to-date with immunizations.  Learn to manage stress.  Obtain ongoing diabetes education and support as needed.  Participate or seek rehabilitation as needed to maintain or improve independence and quality of life. Request a physical or occupational therapy referral if you are having foot or hand numbness or difficulties with grooming, dressing, eating, or physical activity. SEEK MEDICAL CARE IF:   You are unable to eat food or drink fluids for more than 6 hours.  You have nausea and vomiting for more than 6 hours.  Your blood glucose level is over 240 mg/dL.  There is a change in mental status.  You develop an additional serious illness.  You have diarrhea for more than 6 hours.  You have been sick or have had a fever for a couple of days and are not getting better.  You have pain during any physical activity. SEEK IMMEDIATE MEDICAL CARE IF:  You have difficulty breathing.  You have moderate to large ketone levels. MAKE SURE YOU:  Understand these instructions.  Will watch your condition.  Will get help right away if you are not doing well or get worse. Document Released: 07/06/2000 Document Revised: 04/02/2012 Document Reviewed: 02/05/2012 [facility] Patient Information 2014 [place], [place].

## 2013-07-15 NOTE — Progress Notes (Signed)
Patient ID: Alyssa [name], female   DOB: February 18, 1946, 67 y.o.   MRN: [id number]   This very nice 67 y.o. MWF with Hypertension, Hyperlipidemia, T2 Diabetes and Vitamin D Deficiency who presents for 1 month F/u of poorly controlled Diabetes   And labile HTN and re-institution of glipizide.   She also had her Losartin dose increased at recent OV.Today's BP is BP: 166/92 mmHg and review of list of home BP's ranging 140-170's/70-90's. Patient denies any cardiac type chest pain, palpitations, dyspnea/orthopnea/PND, dizziness, claudication, or dependent edema.   Hyperlipidemia is controlled with diet & meds. Last Cholesterol was 214, Triglycerides were 148, HDL 56 and LDL 128 in Aug.. Patient denies myalgias or other med SE's.    Also, the patient has history of T2 NIDDM with last with recent A1c 10.9% in Dec. Patient has been covering glucoses wit glipizide 10 mg at 1/2 - 1 tab by sliding scale with am glucosse in the 100-140+ range and evening glucoses Post-prandial in the mid 200's range (patient was advised only to check 3 hour fasting glucoses!).   Patient denies any symptoms of reactive hypoglycemia, diabetic polys, paresthesias or visual blurring.   Further, Patient has history of Vitamin D Deficiency with last vitamin D of 81 in July. Patient supplements vitamin D without any suspected side-effects.    Medication List       amLODipine 5 MG tablet  Commonly known as:  NORVASC  Take 5 mg by mouth 2 (two) times daily.     aspirin 81 MG tablet  Take 81 mg by mouth daily.     atenolol 50 MG tablet  Commonly known as:  TENORMIN  Take 50 mg by mouth 2 (two) times daily.     BRINTELLIX 10 MG Tabs  Generic drug:  Vortioxetine HBr  Take 10 mg by mouth daily.     Fish Oil 1000 MG Caps  Take by mouth. Takes in pm     furosemide 40 MG tablet  Commonly known as:  LASIX  Take 40 mg by mouth. 1/2 daily     gabapentin 800 MG tablet  Commonly known as:  NEURONTIN  TAKE 1 TABLET BY MOUTH FOUR  TIMES DAILY FOR FIBROMYALGIA PAIN     glipiZIDE 10 MG tablet  Commonly known as:  GLUCOTROL  Takes 1/2 to 1 tab bid/tid prn sliding scale     HYDROcodone-acetaminophen 5-325 MG per tablet  Commonly known as:  NORCO/VICODIN  Take 1 tablet by mouth every 6 (six) hours as needed for moderate pain. States she takes rarely     levothyroxine 50 MCG tablet                                          (Refilled Generic)  Commonly known as:  SYNTHROID  Take 1 tablet (50 mcg total) by mouth daily before breakfast.     losartan 100 MG tablet  Commonly known as:  COZAAR  TAKE ONE TABLET BY MOUTH DAILY     magnesium oxide 400 MG tablet  Commonly known as:  MAG-OX  Take 400 mg by mouth 2 (two) times daily.     metFORMIN 500 MG tablet  Commonly known as:  GLUCOPHAGE  Take 1,000 mg by mouth 2 (two) times daily with a meal.     minocycline 100 MG capsule  Commonly known as:  MINOCIN,DYNACIN  Take 100 mg by  mouth daily.     minoxidil 10 MG tablet                                                           (NEW)  Commonly known as:  LONITEN  Take 1 tablet (10 mg total) by mouth daily.     multivitamin tablet  Take 1 tablet by mouth daily.     phentermine 37.5 MG tablet                                                 (Refilled)  Commonly known as:  ADIPEX-P  1/2 to 1 tablet every morning for weight loss     PROBIOTIC DAILY PO  Take by mouth daily.     rosuvastatin 5 MG tablet  Commonly known as:  CRESTOR  Take 5 mg by mouth daily.     traZODone 150 MG tablet  Commonly known as:  DESYREL  Take by mouth at bedtime.     vitamin B-12 1000 MCG tablet  Commonly known as:  CYANOCOBALAMIN  Take 1,000 mcg by mouth daily.     VITAMIN D-3 PO  Take 5,000 Units by mouth. Takes in PM          Allergies  Allergen Reactions  . Ciprofloxacin Hcl   . Codeine   . Cymbalta [Duloxetine Hcl]   . Lortab [Hydrocodone-Acetaminophen]   . Oxycodone     PMHx:   Past Medical History  Diagnosis  Date  . Osteopenia   . Osteoporosis   . Diabetes mellitus without complication   . Hypertension   . Bipolar 1 disorder   . Hyperlipidemia   . Anemia   . Cataract   . Unspecified vitamin D deficiency     FHx:    Reviewed / unchanged  SHx:    Reviewed / unchanged  Systems Review: Constitutional: Denies fever, chills, wt changes, headaches, insomnia, fatigue, night sweats, change in appetite. Eyes: Denies redness, blurred vision, diplopia, discharge, itchy, watery eyes.  ENT: Denies discharge, congestion, post nasal drip, epistaxis, sore throat, earache, hearing loss, dental pain, tinnitus, vertigo, sinus pain, snoring.  CV: Denies chest pain, palpitations, irregular heartbeat, syncope, dyspnea, diaphoresis, orthopnea, PND, claudication, edema. Respiratory: denies cough, dyspnea, DOE, pleurisy, hoarseness, laryngitis, wheezing.  Gastrointestinal: Denies dysphagia, odynophagia, heartburn, reflux, water brash, abdominal pain or cramps, nausea, vomiting, bloating, diarrhea, constipation, hematemesis, melena, hematochezia,  or hemorrhoids. Genitourinary: Denies dysuria, frequency, urgency, nocturia, hesitancy, discharge, hematuria, flank pain. Musculoskeletal: Denies arthralgias, myalgias, stiffness, jt. swelling, pain, limp, strain/sprain.  Skin: Denies pruritus, rash, hives, warts, acne, eczema, change in skin lesion(s). Neuro: No weakness, tremor, incoordination, spasms, paresthesia, or pain. Psychiatric: Denies confusion, memory loss, or sensory loss. Endo: Denies change in weight, skin, hair change.  Heme/Lymph: No excessive bleeding, bruising, orenlarged lymph nodes.  BP: 166/92  Pulse: 60  Temp: 97.9 F (36.6 C)  Resp: 16    Estimated body mass index is 36.89 kg/(m^2) as calculated from the following:   Height as of 06/23/13: 5' 3.25" (1.607 m).   Weight as of this encounter: 210 lb (95.255 kg).  On Exam: Appears well nourished - in no distress. Eyes:  PERRLA, EOMs,  conjunctiva no swelling or erythema. Sinuses: No frontal/maxillary tenderness ENT/Mouth: EAC's clear, TM's nl w/o erythema, bulging. Nares clear w/o erythema, swelling, exudates. Oropharynx clear without erythema or exudates. Oral hygiene is good. Tongue normal, non obstructing. Hearing intact.  Neck: Supple. Thyroid nl. Car 2+/2+ without bruits, nodes or JVD. Chest: Respirations nl with BS clear & equal w/o rales, rhonchi, wheezing or stridor.  Cor: Heart sounds normal w/ regular rate and rhythm without sig. murmurs, gallops, clicks, or rubs. Peripheral pulses normal and equal  With 1 + ankle  edema.  Abdomen: Soft & bowel sounds normal. Non-tender w/o guarding, rebound, hernias, masses, or organomegaly.  Lymphatics: Unremarkable.  Musculoskeletal: Full ROM  and walks with a walker.  Skin: Warm, dry without exposed rashes, lesions, ecchymosis apparent.  Neuro: Cranial nerves intact, reflexes equal bilaterally. Sensory-motor testing grossly intact. Tendon reflexes grossly intact.  Pysch: Alert & oriented x 3. Insight and judgement nl & appropriate. No ideations.  Assessment and Plan:  1. Hypertension - Continue monitor blood pressure at home.  Add Rx Minoxidil 10 mg at 1/4 tab bid and may increase to 1/2 tab bid if needed and hopefully will be able to taper other BP meds as time progresses.  2. Hyperlipidemia - Continue diet/meds, exercise,& lifestyle modifications. Continue monitor periodic cholesterol/liver & renal functions   3. T2 NIDDM - Continue same prn schedule for glipizide 10 mg, ie 1/2 tab for glu betw 140-200 and 1 whole tab>200 mg%. Otherwise, continue recommend prudent low glycemic diet, weight control (refilled phentermine as patient is losing weight), regular exercise(chair and stretching), diabetic monitoring and periodic eye exams. Dispensed Freestyle Promise lite glucometer and instructed in use and stat glucose was 50 mg% and patient was given fruit juice and a pack of cheese  crackers.  4. Vitamin D Deficiency - Continue supplementation.  5. Depression - disp 5 wk sx's Brintellix 10 mg as pt's ins plan will not cover it  6. Morbid Obesity  7. Hypothyroidism - continue with generic l-thyroxine 50 mcg qd (rx refilled) and recheck TSH at 1 mo follow to determine clinical status  approx 45 minutes face to face time spent with patient and her daughter in examination, counseling and answering questions  Recommended regular exercise, BP monitoring, weight control, and discussed med and SE's. Recommended labs to assess and monitor clinical status. Further disposition pending results of labs.

## 2013-07-29 ENCOUNTER — Other Ambulatory Visit: Payer: Self-pay | Admitting: Emergency Medicine

## 2013-08-03 ENCOUNTER — Ambulatory Visit: Payer: Self-pay | Admitting: Gynecology

## 2013-08-04 ENCOUNTER — Encounter: Payer: Self-pay | Admitting: Internal Medicine

## 2013-08-10 ENCOUNTER — Encounter: Payer: Self-pay | Admitting: Internal Medicine

## 2013-08-11 ENCOUNTER — Other Ambulatory Visit: Payer: Self-pay | Admitting: Emergency Medicine

## 2013-08-15 ENCOUNTER — Other Ambulatory Visit: Payer: Self-pay | Admitting: Internal Medicine

## 2013-08-20 ENCOUNTER — Ambulatory Visit (INDEPENDENT_AMBULATORY_CARE_PROVIDER_SITE_OTHER): Payer: Medicare Other | Admitting: Internal Medicine

## 2013-08-20 VITALS — BP 144/76 | HR 54 | Temp 97.7°F | Resp 18 | Wt 218.6 lb

## 2013-08-20 DIAGNOSIS — I10 Essential (primary) hypertension: Secondary | ICD-10-CM

## 2013-08-20 DIAGNOSIS — E1165 Type 2 diabetes mellitus with hyperglycemia: Secondary | ICD-10-CM

## 2013-08-20 DIAGNOSIS — IMO0001 Reserved for inherently not codable concepts without codable children: Secondary | ICD-10-CM

## 2013-08-20 MED ORDER — MINOXIDIL 10 MG PO TABS: ORAL_TABLET | ORAL | Status: DC

## 2013-08-20 MED ORDER — GLIPIZIDE 10 MG PO TABS: ORAL_TABLET | ORAL | Status: DC

## 2013-08-21 ENCOUNTER — Encounter: Payer: Self-pay | Admitting: Internal Medicine

## 2013-08-21 NOTE — Progress Notes (Signed)
Patient ID: Alyssa [name], female   DOB: Nov 07, 1945, 68 y.o.   MRN: [id number]   This very nice 68 y.o. MWF presents with her daughter for med review and for r 3 month follow up with Hypertension, Hyperlipidemia, Pre-Diabetes and Vitamin D Deficiency.    BP has been controlled at home. Today's BP: 144/76 mmHg . Patient denies any cardiac type chest pain, palpitations, dyspnea/orthopnea/PND, dizziness, claudication, or dependent edema.   Hyperlipidemia is controlled with diet & meds. Also, the patient has history  NIDDM and reports her  CBG's are ranging betw 100-200 mg% and she is covering with SS Glipizide at 1/2 tab prn. Patient denies any symptoms of reactive hypoglycemia, diabetic polys, paresthesias or visual blurring.   Also, she continues to report improvement in her depression on Brintellix Samples.    Medication List         aspirin 81 MG tablet  Take 81 mg by mouth daily.     atenolol 50 MG tablet  Commonly known as:  TENORMIN  Take 50 mg by mouth 2 (two) times daily.     BRINTELLIX 10 MG Tabs  Generic drug:  Vortioxetine HBr  Take 10 mg by mouth daily.     Fish Oil 1000 MG Caps  Take by mouth. Takes in pm     furosemide 40 MG tablet  Commonly known as:  LASIX  TAKE 1 TABLET BY MOUTH DAILY     gabapentin 800 MG tablet  Commonly known as:  NEURONTIN  TAKE 1 TABLET BY MOUTH FOUR TIMES DAILY FOR FIBROMYALGIA PAIN     glipiZIDE 10 MG tablet  Commonly known as:  GLUCOTROL  Take 1/2 tablet daily if glucose greater 200 mg %     HYDROcodone-acetaminophen 5-325 MG per tablet  Commonly known as:  NORCO/VICODIN  Take 1 tablet by mouth every 6 (six) hours as needed for moderate pain. States she takes rarely     levothyroxine 50 MCG tablet  Commonly known as:  SYNTHROID  Take 1 tablet (50 mcg total) by mouth daily before breakfast.     losartan 100 MG tablet  Commonly known as:  COZAAR  TAKE ONE TABLET BY MOUTH DAILY     magnesium oxide 400 MG tablet  Commonly known  as:  MAG-OX  Take 400 mg by mouth 2 (two) times daily.     metFORMIN 500 MG 24 hr tablet  Commonly known as:  GLUCOPHAGE-XR  TAKE 2 TABLETS BY MOUTH TWICE DAILY FOR DIABETES     minocycline 100 MG capsule  Commonly known as:  MINOCIN,DYNACIN  Take 100 mg by mouth daily.     minoxidil 10 MG tablet  Commonly known as:  LONITEN  1/2 to 1 tablet daily as directed for BP     multivitamin tablet  Take 1 tablet by mouth daily.     phentermine 37.5 MG tablet  Commonly known as:  ADIPEX-P  1/2 to 1 tablet every morning for weight loss     PROBIOTIC DAILY PO  Take by mouth daily.     rosuvastatin 5 MG tablet  Commonly known as:  CRESTOR  Take 5 mg by mouth daily.     traZODone 150 MG tablet  Commonly known as:  DESYREL  Take by mouth at bedtime.     vitamin B-12 1000 MCG tablet  Commonly known as:  CYANOCOBALAMIN  Take 1,000 mcg by mouth daily.     VITAMIN D-3 PO  Take 5,000 Units by mouth. Takes in  PM         Allergies  Allergen Reactions  . Ciprofloxacin Hcl   . Codeine   . Cymbalta [Duloxetine Hcl]   . Lortab [Hydrocodone-Acetaminophen]   . Oxycodone     PMHx:   Past Medical History  Diagnosis Date  . Osteopenia   . Osteoporosis   . Diabetes mellitus without complication   . Hypertension   . Bipolar 1 disorder   . Hyperlipidemia   . Anemia   . Cataract   . Unspecified vitamin D deficiency     FHx:    Reviewed / unchanged  SHx:    Reviewed / unchanged  Systems Review: Constitutional: Denies fever, chills, wt changes, headaches, insomnia, fatigue, night sweats, change in appetite. Eyes: Denies redness, blurred vision, diplopia, discharge, itchy, watery eyes.  ENT: Denies discharge, congestion, post nasal drip, epistaxis, sore throat, earache, hearing loss, dental pain, tinnitus, vertigo, sinus pain, snoring.  CV: Denies chest pain, palpitations, irregular heartbeat, syncope, dyspnea, diaphoresis, orthopnea, PND, claudication, edema. Respiratory:  denies cough, dyspnea, DOE, pleurisy, hoarseness, laryngitis, wheezing.  Gastrointestinal: Denies dysphagia, odynophagia, heartburn, reflux, water brash, abdominal pain or cramps, nausea, vomiting, bloating, diarrhea, constipation, hematemesis, melena, hematochezia,  or hemorrhoids. Genitourinary: Denies dysuria, frequency, urgency, nocturia, hesitancy, discharge, hematuria, flank pain. Musculoskeletal: Denies arthralgias, myalgias, stiffness, jt. swelling, pain, limp, strain/sprain.  Skin: Denies pruritus, rash, hives, warts, acne, eczema, change in skin lesion(s). Neuro: No weakness, tremor, incoordination, spasms, paresthesia, or pain. Psychiatric: Denies confusion, memory loss, or sensory loss. Endo: Denies change in weight, skin, hair change.  Heme/Lymph: No excessive bleeding, bruising, orenlarged lymph nodes.  BP: 144/76  Pulse: 54  Temp: 97.7 F (36.5 C)  Resp: 18    Estimated body mass index is 38.4 kg/(m^2) as calculated from the following:   Height as of 06/23/13: 5' 3.25" (1.607 m).   Weight as of this encounter: 218 lb 9.6 oz (99.156 kg).  On Exam: Appears well nourished - in no distress. HEENT - Neg. Neck: Supple. Thyroid nl. Car 2+/2+ without bruits, nodes or JVD. Chest: Respirations nl with BS clear & equal w/o rales, rhonchi, wheezing or stridor.  Cor: Heart sounds normal w/ regular rate and rhythm without sig. murmurs, gallops, clicks, or rubs. Peripheral pulses normal and equal  without edema.  Musculoskeletal: Full ROM all peripheral extremities, joint stability, 5/5 strength, and normal gait.  Skin: Warm, dry without exposed rashes, lesions, ecchymosis apparent.  Neuro: Cranial nerves intact, reflexes equal bilaterally. Sensory-motor testing grossly intact. Tendon reflexes grossly intact.  Pysch: Alert & oriented x 3. Insight and judgement nl & appropriate. No ideations.  Assessment and Plan:  1. Hypertension - Continue monitor blood pressure at home. Continue  diet/meds same.  2. Hyperlipidemia - Continue diet/meds, exercise,& lifestyle modifications. Continue monitor periodic cholesterol/liver & renal functions   3. T2 Diabetes - continue recommend prudent low glycemic diet, weight loss, regular exercise, diabetic monitoring and periodic eye exams.  4. Vitamin D Deficiency - Continue supplementation.  5. Morbid Obesity  6. Depression - Brintellix  Sx's today  Recommended regular exercise, BP monitoring, weight control, and discussed med and SE's. F/U in 3 months of prn

## 2013-09-04 ENCOUNTER — Other Ambulatory Visit: Payer: Self-pay | Admitting: *Deleted

## 2013-09-04 MED ORDER — GLIPIZIDE 10 MG PO TABS: ORAL_TABLET | ORAL | Status: DC

## 2013-09-17 ENCOUNTER — Other Ambulatory Visit: Payer: Self-pay | Admitting: Emergency Medicine

## 2013-09-17 ENCOUNTER — Encounter: Payer: Self-pay | Admitting: Emergency Medicine

## 2013-09-17 DIAGNOSIS — I10 Essential (primary) hypertension: Secondary | ICD-10-CM

## 2013-09-17 MED ORDER — MINOXIDIL 10 MG PO TABS: ORAL_TABLET | ORAL | Status: DC

## 2013-09-17 MED ORDER — MAGNESIUM OXIDE 400 MG PO TABS: 400.0000 mg | ORAL_TABLET | Freq: Two times a day (BID) | ORAL | Status: DC

## 2013-09-17 MED ORDER — ATENOLOL 50 MG PO TABS: 50.0000 mg | ORAL_TABLET | Freq: Two times a day (BID) | ORAL | Status: DC

## 2013-09-17 MED ORDER — LOSARTAN POTASSIUM 100 MG PO TABS: ORAL_TABLET | ORAL | Status: DC

## 2013-09-23 ENCOUNTER — Ambulatory Visit (AMBULATORY_SURGERY_CENTER): Payer: Medicare Other | Admitting: *Deleted

## 2013-09-23 VITALS — Ht 64.0 in | Wt 224.0 lb

## 2013-09-23 DIAGNOSIS — Z1211 Encounter for screening for malignant neoplasm of colon: Secondary | ICD-10-CM

## 2013-09-23 MED ORDER — MOVIPREP 100 G PO SOLR: ORAL | Status: DC

## 2013-09-23 NOTE — Progress Notes (Signed)
No allergies to eggs or soy. No problems with anesthesia.  

## 2013-09-25 ENCOUNTER — Ambulatory Visit: Payer: Self-pay | Admitting: Emergency Medicine

## 2013-09-28 ENCOUNTER — Encounter: Payer: Self-pay | Admitting: Internal Medicine

## 2013-09-29 ENCOUNTER — Ambulatory Visit: Payer: Self-pay | Admitting: Emergency Medicine

## 2013-09-30 ENCOUNTER — Telehealth: Payer: Self-pay | Admitting: Internal Medicine

## 2013-09-30 NOTE — Telephone Encounter (Signed)
Trazadone is noted on pt's med list

## 2013-10-01 ENCOUNTER — Ambulatory Visit (INDEPENDENT_AMBULATORY_CARE_PROVIDER_SITE_OTHER): Payer: Medicare Other | Admitting: Emergency Medicine

## 2013-10-01 ENCOUNTER — Encounter: Payer: Self-pay | Admitting: Emergency Medicine

## 2013-10-01 VITALS — BP 140/80 | HR 78 | Temp 98.6°F | Resp 16 | Ht 63.25 in | Wt 224.0 lb

## 2013-10-01 DIAGNOSIS — E1159 Type 2 diabetes mellitus with other circulatory complications: Secondary | ICD-10-CM

## 2013-10-01 DIAGNOSIS — E119 Type 2 diabetes mellitus without complications: Secondary | ICD-10-CM

## 2013-10-01 DIAGNOSIS — E039 Hypothyroidism, unspecified: Secondary | ICD-10-CM

## 2013-10-01 DIAGNOSIS — R350 Frequency of micturition: Secondary | ICD-10-CM

## 2013-10-01 DIAGNOSIS — F329 Major depressive disorder, single episode, unspecified: Secondary | ICD-10-CM

## 2013-10-01 DIAGNOSIS — F3289 Other specified depressive episodes: Secondary | ICD-10-CM

## 2013-10-01 DIAGNOSIS — F32A Depression, unspecified: Secondary | ICD-10-CM

## 2013-10-01 DIAGNOSIS — I10 Essential (primary) hypertension: Secondary | ICD-10-CM

## 2013-10-01 DIAGNOSIS — E782 Mixed hyperlipidemia: Secondary | ICD-10-CM

## 2013-10-01 DIAGNOSIS — E538 Deficiency of other specified B group vitamins: Secondary | ICD-10-CM

## 2013-10-01 NOTE — Progress Notes (Signed)
   Subjective:    Patient ID: Alyssa [name], female    DOB: 27-Sep-1945, 68 y.o.   MRN: [id number]  HPI Comments: 68 yo obese female presents for 3 month F/U for HTN, Cholesterol, DM, D. Deficient. She restarted water exercises and has seen some improvement with energy and mood.She has not been eating healthy, but is trying to improve diet and stop skipping meals. She notes BS and BP has been good at home. She checks feet routinely and denies skin break down or neuropathy increase. She has noticed mild edema increase in both feet without increase in sodium.  She has increased h2o but has also noticed increased urination frequency. She denies any other UTI symptoms.  She has been on Brintellix for several months now and has noticed improved mood, energy, clear thinking, and even improved ability to write. She notes her writing was illegible before starting medication. Daughter is concerned she is still edgy and occasionally more depressed. Patient notes that overall she feels improved.         Review of Systems  Constitutional: Positive for fatigue.  Cardiovascular: Positive for leg swelling.  Genitourinary: Positive for frequency.  All other systems reviewed and are negative.   BP 140/80  Pulse 78  Temp(Src) 98.6 F (37 C) (Temporal)  Resp 16  Ht 5' 3.25" (1.607 m)  Wt 224 lb (101.606 kg)  BMI 39.34 kg/m2     Objective:   Physical Exam  Nursing note and vitals reviewed. Constitutional: She is oriented to person, place, and time. She appears well-developed and well-nourished. No distress.  Obese  HENT:  Head: Normocephalic and atraumatic.  Right Ear: External ear normal.  Left Ear: External ear normal.  Nose: Nose normal.  Mouth/Throat: Oropharynx is clear and moist.  Eyes: Conjunctivae and EOM are normal.  Neck: Normal range of motion. Neck supple. No JVD present. No thyromegaly present.  Cardiovascular: Normal rate, regular rhythm, normal heart sounds and intact distal  pulses.   1 + LE edema   Pulmonary/Chest: Effort normal and breath sounds normal.  Abdominal: Soft. Bowel sounds are normal. She exhibits no distension and no mass. There is no tenderness. There is no rebound and no guarding.  Musculoskeletal: Normal range of motion. She exhibits no edema and no tenderness.  Slight fallen arch of both feet  Lymphadenopathy:    She has no cervical adenopathy.  Neurological: She is alert and oriented to person, place, and time. No cranial nerve deficit.  Skin: Skin is warm and dry. No rash noted. No erythema. No pallor.  No Breakdown of skin on feet.  Psychiatric: She has a normal mood and affect. Her behavior is normal. Judgment and thought content normal.  + improvement with depression but mildly sarcastic with responses     Fall/ Depression screen completed. Mild fall risk. + depression Screen     Assessment & Plan:  1.  3 month F/U for Obesity, HTN, Cholesterol,DM, D. Deficient. Needs healthy diet, cardio QD and obtain healthy weight. Check Labs, Check BP if >130/80 call office, Check BS if >200 call office  2. Edema-Atenolol decrease to 1/2 am 1 in pm to see if any improvement with edema.Elevate legs TID, increase activity, increase H2o, decrease sodium intake, Wear compression socks more routinely if available.  3 Frequency- Check labs, increase H20, bland diet  4.Depression- Advised counseling, increase activity, continue prescriptions AD. Brintellix 20 mg MWF 10 mg rest SX #[id number]

## 2013-10-01 NOTE — Patient Instructions (Signed)
We want weight loss that will last so you should lose 1-2 pounds a week.  THAT IS IT! Please pick THREE things a month to change. Once it is a habit check off the item. Then pick another three items off the list to become habits.  If you are already doing a habit on the list GREAT!  Cross that item off! o Don't drink your calories. Ie, alcohol, soda, fruit juice, and sweet tea.  o Drink more water. Drink a glass when you feel hungry or before each meal.  o Eat breakfast - Complex carb and protein (likeDannon light and fit yogurt, oatmeal, fruit, eggs, [place] bacon). o Measure your cereal.  Eat no more than one cup a day. (ie [place]) o Eat an apple a day. o Add a vegetable a day. o Try a new vegetable a month. o Use Pam! Stop using oil or butter to cook. o Don't finish your plate or use smaller plates. o Share your dessert. o Eat sugar free Jello for dessert or frozen grapes. o Don't eat 2-3 hours before bed. o Switch to whole wheat bread, pasta, and brown rice. o Make healthier choices when you eat out. No fries! o Pick baked chicken, NOT fried. o Don't forget to SLOW DOWN when you eat. It is not going anywhere.  o Take the stairs. o Park far away in the parking lot o [employer] (or weights) for 10 minutes while watching TV. o Walk at work for 10 minutes during break. o Walk outside 1 time a week with your friend, kids, dog, or significant other. o Start a walking group at [place] the mall as much as you can tolerate.  o Keep a food diary. o Weigh yourself daily. o Walk for 15 minutes 3 days per week. o Cook at home more often and eat out less.  If life happens and you go back to old habits, it is okay.  Just start over. You can do it!   If you experience chest pain, get short of breath, or tired during the exercise, please stop immediately and inform your doctor.    Bad carbs also include fruit juice, alcohol, and sweet tea. These are empty calories that do not signal to  your brain that you are full.   Please remember the good carbs are still carbs which convert into sugar. So please measure them out no more than 1/2-1 cup of rice, oatmeal, pasta, and beans.  Veggies are however free foods! Pile them on.   I like lean protein at every meal such as chicken, [place], pork chops, cottage cheese, etc. Just do not fry these meats and please center your meal around vegetable, the meats should be a side dish.   No all fruit is created equal. Please see the list below, the fruit at the bottom is higher in sugars than the fruit at the top   Depression, Adult Depression is feeling sad, low, down in the dumps, blue, gloomy, or empty. In general, there are two kinds of depression:  Normal sadness or grief. This can happen after something upsetting. It often goes away on its own within 2 weeks. After losing a loved one (bereavement), normal sadness and grief may last longer than two weeks. It usually gets better with time.  Clinical depression. This kind lasts longer than normal sadness or grief. It keeps you from doing the things you normally do in life. It is often hard to function at home,  work, or at school. It may affect your relationships with others. Treatment is often needed. GET HELP RIGHT AWAY IF:  You have thoughts about hurting yourself or others.  You lose touch with reality (psychotic symptoms). You may:  See or hear things that are not real.  Have untrue beliefs about your life or people around you.  Your medicine is giving you problems. MAKE SURE YOU:  Understand these instructions.  Will watch your condition.  Will get help right away if you are not doing well or get worse. Document Released: 08/11/2010 Document Revised: 04/02/2012 Document Reviewed: 11/08/2011 [facility] Patient Information 2014 [place], [place].

## 2013-10-02 LAB — CBC WITH DIFFERENTIAL/PLATELET
Basophils Absolute: 0.1 10*3/uL (ref 0.0–0.1)
Basophils Relative: 1 % (ref 0–1)
EOS ABS: 0.1 10*3/uL (ref 0.0–0.7)
EOS PCT: 2 % (ref 0–5)
HEMATOCRIT: 33.8 % — AB (ref 36.0–46.0)
Hemoglobin: 10.8 g/dL — ABNORMAL LOW (ref 12.0–15.0)
LYMPHS ABS: 1.8 10*3/uL (ref 0.7–4.0)
Lymphocytes Relative: 27 % (ref 12–46)
MCH: 28.1 pg (ref 26.0–34.0)
MCHC: 32 g/dL (ref 30.0–36.0)
MCV: 88 fL (ref 78.0–100.0)
MONOS PCT: 5 % (ref 3–12)
Monocytes Absolute: 0.3 10*3/uL (ref 0.1–1.0)
NEUTROS PCT: 65 % (ref 43–77)
Neutro Abs: 4.3 10*3/uL (ref 1.7–7.7)
Platelets: 288 10*3/uL (ref 150–400)
RBC: 3.84 MIL/uL — AB (ref 3.87–5.11)
RDW: 14.9 % (ref 11.5–15.5)
WBC: 6.6 10*3/uL (ref 4.0–10.5)

## 2013-10-02 LAB — URINALYSIS, MICROSCOPIC ONLY
Bacteria, UA: NONE SEEN
CRYSTALS: NONE SEEN
Casts: NONE SEEN
Squamous Epithelial / LPF: NONE SEEN

## 2013-10-02 LAB — HEMOGLOBIN A1C
Hgb A1c MFr Bld: 6.9 % — ABNORMAL HIGH (ref <5.7)
Mean Plasma Glucose: 151 mg/dL — ABNORMAL HIGH (ref <117)

## 2013-10-02 LAB — TSH: TSH: 4.004 u[IU]/mL (ref 0.350–4.500)

## 2013-10-02 LAB — HEPATIC FUNCTION PANEL
ALBUMIN: 3.7 g/dL (ref 3.5–5.2)
ALT: 15 U/L (ref 0–35)
AST: 17 U/L (ref 0–37)
Alkaline Phosphatase: 56 U/L (ref 39–117)
BILIRUBIN INDIRECT: 0.2 mg/dL (ref 0.2–1.2)
BILIRUBIN TOTAL: 0.3 mg/dL (ref 0.2–1.2)
Bilirubin, Direct: 0.1 mg/dL (ref 0.0–0.3)
Total Protein: 6 g/dL (ref 6.0–8.3)

## 2013-10-02 LAB — LIPID PANEL
Cholesterol: 160 mg/dL (ref 0–200)
HDL: 48 mg/dL (ref >39)
LDL Cholesterol: 78 mg/dL (ref 0–99)
TRIGLYCERIDES: 170 mg/dL — AB (ref <150)
Total CHOL/HDL Ratio: 3.3 Ratio
VLDL: 34 mg/dL (ref 0–40)

## 2013-10-02 LAB — BASIC METABOLIC PANEL WITH GFR
BUN: 25 mg/dL — AB (ref 6–23)
CO2: 32 mEq/L (ref 19–32)
CREATININE: 1.54 mg/dL — AB (ref 0.50–1.10)
Calcium: 9.7 mg/dL (ref 8.4–10.5)
Chloride: 101 mEq/L (ref 96–112)
GFR, Est African American: 40 mL/min — ABNORMAL LOW
GFR, Est Non African American: 34 mL/min — ABNORMAL LOW
GLUCOSE: 187 mg/dL — AB (ref 70–99)
Potassium: 3.9 mEq/L (ref 3.5–5.3)
Sodium: 144 mEq/L (ref 135–145)

## 2013-10-02 LAB — URINALYSIS, ROUTINE W REFLEX MICROSCOPIC
Bilirubin Urine: NEGATIVE
Glucose, UA: 100 mg/dL — AB
Hgb urine dipstick: NEGATIVE
Nitrite: NEGATIVE
Protein, ur: >300 mg/dL — AB
Specific Gravity, Urine: 1.019 (ref 1.005–1.030)
Urobilinogen, UA: 0.2 mg/dL (ref 0.0–1.0)
pH: 6 (ref 5.0–8.0)

## 2013-10-02 LAB — INSULIN, FASTING: Insulin fasting, serum: 29 u[IU]/mL — ABNORMAL HIGH (ref 3–28)

## 2013-10-02 LAB — VITAMIN B12: VITAMIN B 12: 981 pg/mL — AB (ref 211–911)

## 2013-10-03 LAB — URINE CULTURE
Colony Count: NO GROWTH
Organism ID, Bacteria: NO GROWTH

## 2013-10-07 ENCOUNTER — Encounter: Payer: Self-pay | Admitting: Internal Medicine

## 2013-10-07 ENCOUNTER — Ambulatory Visit (AMBULATORY_SURGERY_CENTER): Payer: Medicare Other | Admitting: Internal Medicine

## 2013-10-07 VITALS — BP 135/80 | HR 58 | Temp 97.1°F | Resp 13 | Ht 64.0 in | Wt 224.0 lb

## 2013-10-07 DIAGNOSIS — D126 Benign neoplasm of colon, unspecified: Secondary | ICD-10-CM

## 2013-10-07 DIAGNOSIS — Z1211 Encounter for screening for malignant neoplasm of colon: Secondary | ICD-10-CM

## 2013-10-07 LAB — GLUCOSE, CAPILLARY
Glucose-Capillary: 81 mg/dL (ref 70–99)
Glucose-Capillary: 140 mg/dL — ABNORMAL HIGH (ref 70–99)

## 2013-10-07 MED ORDER — SODIUM CHLORIDE 0.9 % IV SOLN: 500.0000 mL | INTRAVENOUS | Status: DC

## 2013-10-07 NOTE — Progress Notes (Signed)
Called to room to assist during endoscopic procedure.  Patient ID and intended procedure confirmed with present staff. Received instructions for my participation in the procedure from the performing physician.  

## 2013-10-07 NOTE — Progress Notes (Signed)
Patients blood sugar 81 upon arrival.  500cc of D5 started IV.

## 2013-10-07 NOTE — Op Note (Signed)
[name] [name], [address]   COLONOSCOPY PROCEDURE REPORT  PATIENT: Alyssa, [name].  MR#: [id number] BIRTHDATE: [birth year] , 67  yrs. old GENDER: Female ENDOSCOPIST: Lafayette Dragon, MD REFERRED RX:[id number] Melford Aase, M.D. PROCEDURE DATE:  10/07/2013 PROCEDURE:   Colonoscopy with cold biopsy polypectomy and Colonoscopy with snare polypectomy First Screening Colonoscopy - Avg.  risk and is 50 yrs.  old or older - No.  Prior Negative Screening - Now for repeat screening. 10 or more years since last screening  History of Adenoma - Now for follow-up colonoscopy & has been > or = to 3 yrs.  N/A  Polyps Removed Today? Yes. ASA CLASS:   Class III INDICATIONS:Average risk patient for colon cancer and last colonoscopy March 2004 showed diverticulosis. MEDICATIONS: MAC sedation, administered by CRNA and propofol (Diprivan) 250mg  IV  DESCRIPTION OF PROCEDURE:   After the risks benefits and alternatives of the procedure were thoroughly explained, informed consent was obtained.  A digital rectal exam revealed no abnormalities of the rectum.   The LB [id number]  endoscope was introduced through the anus and advanced to the cecum, which was identified by both the appendix and ileocecal valve. No adverse events experienced.   The quality of the prep was good, using MoviPrep  The instrument was then slowly withdrawn as the colon was fully examined.      COLON FINDINGS: Three smooth sessile polyps ranging between 5-50mm in size were found in the descending colon at 90 cm, 80 cm  and sigmoid colon.at 30 cm  A polypectomy was performed with cold forceps at 80 cm,x1 and with a cold snare.x2  The resection was complete and the polyp tissue was completely retrieved.   Moderate diverticulosis was noted in the sigmoid colon.  Retroflexed views revealed no abnormalities. The time to cecum=7 minutes 40 seconds. Withdrawal time=8 minutes 50 seconds.  The scope  was withdrawn and the procedure completed. COMPLICATIONS: There were no complications.  ENDOSCOPIC IMPRESSION: 1.   Three sessile polyps ranging between 5-107mm in size were found in the descending colon and sigmoid colon; polypectomy was performed with cold forceps and with a cold snare 2.   Moderate diverticulosis was noted in the sigmoid colon 3. small internal hemorrhoids  RECOMMENDATIONS: 1.  Await pathology results 2.  high fiber diet No aspirin or anti-inflammatory agents for 2 weeks Recall colonoscopy pending biopsies   eSigned:  Lafayette Dragon, MD 10/07/2013 10:26 AM   cc:   PATIENT NAME:  Alyssa [name]. MR#: [id number]

## 2013-10-07 NOTE — Progress Notes (Signed)
Lidocaine-40mg IV prior to Propofol InductionPropofol given over incremental dosages 

## 2013-10-07 NOTE — Patient Instructions (Signed)
YOU HAD AN ENDOSCOPIC PROCEDURE TODAY AT THE [facility]: Refer to the procedure report that was given to you for any specific questions about what was found during the examination.  If the procedure report does not answer your questions, please call your gastroenterologist to clarify.  If you requested that your care partner not be given the details of your procedure findings, then the procedure report has been included in a sealed envelope for you to review at your convenience later.  YOU SHOULD EXPECT: Some feelings of bloating in the abdomen. Passage of more gas than usual.  Walking can help get rid of the air that was put into your GI tract during the procedure and reduce the bloating. If you had a lower endoscopy (such as a colonoscopy or flexible sigmoidoscopy) you may notice spotting of blood in your stool or on the toilet paper. If you underwent a bowel prep for your procedure, then you may not have a normal bowel movement for a few days.  DIET: Your first meal following the procedure should be a light meal and then it is ok to progress to your normal diet.  A half-sandwich or bowl of soup is an example of a good first meal.  Heavy or fried foods are harder to digest and may make you feel nauseous or bloated.  Likewise meals heavy in dairy and vegetables can cause extra gas to form and this can also increase the bloating.  Drink plenty of fluids but you should avoid alcoholic beverages for 24 hours.  ACTIVITY: Your care partner should take you home directly after the procedure.  You should plan to take it easy, moving slowly for the rest of the day.  You can resume normal activity the day after the procedure however you should NOT DRIVE or use heavy machinery for 24 hours (because of the sedation medicines used during the test).    SYMPTOMS TO REPORT IMMEDIATELY: A gastroenterologist can be reached at any hour.  During normal business hours, 8:30 AM to 5:00 PM Monday through Friday,  call [phone].  After hours and on weekends, please call the GI answering service at [phone] who will take a message and have the physician on call contact you.   Following lower endoscopy (colonoscopy or flexible sigmoidoscopy):  Excessive amounts of blood in the stool  Significant tenderness or worsening of abdominal pains  Swelling of the abdomen that is new, acute  Fever of 100F or higher  FOLLOW UP: If any biopsies were taken you will be contacted by phone or by letter within the next 1-3 weeks.  Call your gastroenterologist if you have not heard about the biopsies in 3 weeks.  Our staff will call the home number listed on your records the next business day following your procedure to check on you and address any questions or concerns that you may have at that time regarding the information given to you following your procedure. This is a courtesy call and so if there is no answer at the home number and we have not heard from you through the emergency physician on call, we will assume that you have returned to your regular daily activities without incident.  SIGNATURES/CONFIDENTIALITY: You and/or your care partner have signed paperwork which will be entered into your electronic medical record.  These signatures attest to the fact that that the information above on your After Visit Summary has been reviewed and is understood.  Full responsibility of the confidentiality of this   discharge information lies with you and/or your care-partner.  NO ASPIRIN, ASPIRIN CONTAINING PRODUCTS OR NSAIDS (ADVIL, MOTRIN, IBUPROFEN, ALEVE) FOR 2 WEEKS  CONTINUE YOUR OTHER MEDICATIONS  AWAIT PATHOLOGY  PLEASE READ HANDOUTS ABOUT POLYPS, DIVERTICULOSIS, HEMORRHOIDS AND HIGH FIBER DIETS

## 2013-10-08 ENCOUNTER — Telehealth: Payer: Self-pay | Admitting: *Deleted

## 2013-10-08 NOTE — Telephone Encounter (Signed)
  Follow up Call-  Call back number 10/07/2013  Post procedure Call Back phone  # [phone]  Permission to leave phone message Yes    [facility]

## 2013-10-09 ENCOUNTER — Ambulatory Visit (INDEPENDENT_AMBULATORY_CARE_PROVIDER_SITE_OTHER): Payer: Medicare Other | Admitting: Physician Assistant

## 2013-10-09 ENCOUNTER — Ambulatory Visit (HOSPITAL_COMMUNITY)
Admission: RE | Admit: 2013-10-09 | Discharge: 2013-10-09 | Disposition: A | Discharge status: Home / Self Care | Payer: Medicare Other | Source: Ambulatory Visit | Attending: Physician Assistant | Admitting: Physician Assistant

## 2013-10-09 ENCOUNTER — Encounter: Payer: Self-pay | Admitting: Physician Assistant

## 2013-10-09 ENCOUNTER — Encounter: Payer: Self-pay | Admitting: Emergency Medicine

## 2013-10-09 VITALS — BP 142/80 | HR 64 | Temp 97.9°F | Resp 16 | Ht 63.0 in | Wt 236.0 lb

## 2013-10-09 DIAGNOSIS — J9600 Acute respiratory failure, unspecified whether with hypoxia or hypercapnia: Secondary | ICD-10-CM | POA: Diagnosis not present

## 2013-10-09 DIAGNOSIS — R0600 Dyspnea, unspecified: Secondary | ICD-10-CM

## 2013-10-09 DIAGNOSIS — R635 Abnormal weight gain: Secondary | ICD-10-CM

## 2013-10-09 DIAGNOSIS — N3000 Acute cystitis without hematuria: Secondary | ICD-10-CM

## 2013-10-09 DIAGNOSIS — R0989 Other specified symptoms and signs involving the circulatory and respiratory systems: Secondary | ICD-10-CM

## 2013-10-09 DIAGNOSIS — R0609 Other forms of dyspnea: Secondary | ICD-10-CM

## 2013-10-09 DIAGNOSIS — E669 Obesity, unspecified: Secondary | ICD-10-CM

## 2013-10-09 DIAGNOSIS — E119 Type 2 diabetes mellitus without complications: Secondary | ICD-10-CM | POA: Insufficient documentation

## 2013-10-09 DIAGNOSIS — J4489 Other specified chronic obstructive pulmonary disease: Secondary | ICD-10-CM | POA: Insufficient documentation

## 2013-10-09 DIAGNOSIS — J449 Chronic obstructive pulmonary disease, unspecified: Secondary | ICD-10-CM | POA: Insufficient documentation

## 2013-10-09 DIAGNOSIS — I10 Essential (primary) hypertension: Secondary | ICD-10-CM

## 2013-10-09 LAB — HEPATIC FUNCTION PANEL
ALBUMIN: 3.9 g/dL (ref 3.5–5.2)
ALT: 42 U/L — ABNORMAL HIGH (ref 0–35)
AST: 49 U/L — ABNORMAL HIGH (ref 0–37)
Alkaline Phosphatase: 65 U/L (ref 39–117)
BILIRUBIN INDIRECT: 0.4 mg/dL (ref 0.2–1.2)
Bilirubin, Direct: 0.1 mg/dL (ref 0.0–0.3)
Total Bilirubin: 0.5 mg/dL (ref 0.2–1.2)
Total Protein: 6.3 g/dL (ref 6.0–8.3)

## 2013-10-09 LAB — CBC WITH DIFFERENTIAL/PLATELET
BASOS ABS: 0 10*3/uL (ref 0.0–0.1)
Basophils Relative: 0 % (ref 0–1)
Eosinophils Absolute: 0.1 10*3/uL (ref 0.0–0.7)
Eosinophils Relative: 1 % (ref 0–5)
HEMATOCRIT: 32.8 % — AB (ref 36.0–46.0)
Hemoglobin: 10.7 g/dL — ABNORMAL LOW (ref 12.0–15.0)
Lymphocytes Relative: 24 % (ref 12–46)
Lymphs Abs: 1.7 10*3/uL (ref 0.7–4.0)
MCH: 28.2 pg (ref 26.0–34.0)
MCHC: 32.6 g/dL (ref 30.0–36.0)
MCV: 86.5 fL (ref 78.0–100.0)
MONO ABS: 0.3 10*3/uL (ref 0.1–1.0)
Monocytes Relative: 4 % (ref 3–12)
NEUTROS ABS: 5 10*3/uL (ref 1.7–7.7)
NEUTROS PCT: 71 % (ref 43–77)
Platelets: 311 10*3/uL (ref 150–400)
RBC: 3.79 MIL/uL — ABNORMAL LOW (ref 3.87–5.11)
RDW: 15 % (ref 11.5–15.5)
WBC: 7.1 10*3/uL (ref 4.0–10.5)

## 2013-10-09 LAB — BASIC METABOLIC PANEL WITH GFR
BUN: 19 mg/dL (ref 6–23)
CO2: 32 mEq/L (ref 19–32)
Calcium: 10 mg/dL (ref 8.4–10.5)
Chloride: 100 mEq/L (ref 96–112)
Creat: 1.28 mg/dL — ABNORMAL HIGH (ref 0.50–1.10)
GFR, Est African American: 50 mL/min — ABNORMAL LOW
GFR, Est Non African American: 43 mL/min — ABNORMAL LOW
Glucose, Bld: 145 mg/dL — ABNORMAL HIGH (ref 70–99)
POTASSIUM: 4.4 meq/L (ref 3.5–5.3)
Sodium: 142 mEq/L (ref 135–145)

## 2013-10-09 MED ORDER — AZITHROMYCIN 250 MG PO TABS: ORAL_TABLET | ORAL | Status: DC

## 2013-10-09 NOTE — Progress Notes (Addendum)
68 y.o. morbidly obese female with history of HTN, poorly controlled DM, CKD, hyperlipidemia presents with SOB for 2 days s/p colonoscopy.  Her blood pressure has not been controlled at home, today their BP is BP: 142/80 mmHg She does not workout. She denies chest pain, shortness of breath, dizziness.  She states she had a colonoscopy Wednesday, 3 polyps removed and she has not been feeling well since then with a headache and shortness of breath, states she can not get a deep breath. Last night she woke up with right ear/headache pain, and body aches, she also noticed she had SOB worse with lying down, trouble getting out of bed due to back. SOB worse with lying down. She also had one episode of incontinence yesterday.  She complains of bilateral edema, her abdomen feels swollen to her, and she has had a 12 lbs weight. Her O2 is 90%. She states she did not take a fluid pill Wednesday and did not take one yesterday either.   Wt Readings from Last 3 Encounters:  10/09/13 236 lb (107.049 kg)  10/07/13 224 lb (101.606 kg)  10/01/13 224 lb (101.606 kg)   Current Outpatient Prescriptions on File Prior to Visit  Medication Sig Dispense Refill  . aspirin 81 MG tablet Take 81 mg by mouth daily.      Marland Kitchen atenolol (TENORMIN) 50 MG tablet Take 1 tablet (50 mg total) by mouth 2 (two) times daily.  180 tablet  1  . brimonidine (ALPHAGAN P) 0.1 % SOLN 1 drop 2 (two) times daily.      . Cholecalciferol (VITAMIN D-3 PO) Take 5,000 Units by mouth. Takes in PM      . dorzolamide (TRUSOPT) 2 % ophthalmic solution Place 1 drop into both eyes 2 (two) times daily.      . furosemide (LASIX) 40 MG tablet TAKE 1 TABLET BY MOUTH DAILY  90 tablet  1  . gabapentin (NEURONTIN) 800 MG tablet take 1 tablet twice a day      . glipiZIDE (GLUCOTROL) 10 MG tablet Take 1/2 to 1 tab bid if glucose greater than 200 mg %  180 tablet  1  . latanoprost (XALATAN) 0.005 % ophthalmic solution Place 1 drop into the right eye at bedtime.       Marland Kitchen levothyroxine (SYNTHROID) 50 MCG tablet Take 1 tablet (50 mcg total) by mouth daily before breakfast.  90 tablet  99  . losartan (COZAAR) 100 MG tablet TAKE ONE TABLET BY MOUTH DAILY  90 tablet  1  . magnesium oxide (MAG-OX) 400 MG tablet Take 1 tablet (400 mg total) by mouth 2 (two) times daily.  180 tablet  1  . metFORMIN (GLUCOPHAGE-XR) 500 MG 24 hr tablet TAKE 2 TABLETS BY MOUTH TWICE DAILY FOR DIABETES  360 tablet  0  . minoxidil (LONITEN) 10 MG tablet Take 10 mg by mouth daily.      . Multiple Vitamin (MULTIVITAMIN) tablet Take 1 tablet by mouth daily.      . Omega-3 Fatty Acids (FISH OIL) 1000 MG CAPS Take by mouth. Takes in pm      . Probiotic Product (PROBIOTIC DAILY PO) Take by mouth daily.      . rosuvastatin (CRESTOR) 5 MG tablet Take 5 mg by mouth daily.      . traZODone (DESYREL) 150 MG tablet Take by mouth at bedtime.      . vitamin B-12 (CYANOCOBALAMIN) 1000 MCG tablet Take 1,000 mcg by mouth daily.      Marland Kitchen  Vortioxetine HBr (BRINTELLIX) 10 MG TABS Take 10 mg by mouth daily.       No current facility-administered medications on file prior to visit.   Past Medical History  Diagnosis Date  . Osteopenia   . Osteoporosis   . Diabetes mellitus without complication   . Hypertension   . Bipolar 1 disorder   . Hyperlipidemia   . Anemia   . Cataract   . Unspecified vitamin D deficiency   . Glaucoma   . Chronic back pain   . Hypothyroidism    BP 142/80  Pulse 64  Temp(Src) 97.9 F (36.6 C)  Resp 16  Ht 5\' 3"  (1.6 m)  Wt 236 lb (107.049 kg)  BMI 41.82 kg/m2 General appearance: cooperative, no distress and morbidly obese Lungs: diminished breath sounds bibasilar and rales base - right Heart: regular rate and rhythm, S1, S2 normal, S3 present and systolic murmur: late systolic 2/6, decrescendo at 2nd left intercostal space Abdomen: normal findings: bowel sounds normal, no masses palpable and soft, non-tender and abnormal findings:  distended and obese Extremities: edema  2+, varicose veins noted and venous stasis dermatitis noted Neurologic: Grossly normal  Assessment and Plan: Dyspnea: Weight gain, SOB, decrease breath sounds, O2 at 90% RA, last EF 52% in 2009, concern for CHF, MI, infection Denies CP,will get EKG rule out MI, get CXR, CBC,BMP, LFTs, Urine since incontinence and get BNP.  Long discussion about going to the ER with patient and her husband and she is refusing to go, understands the risk of death, will try to treat outpatient.  Currently she seems to be in no distress at 90% O2.  will go to ER if symptoms worsen.  Will increase lasix to 2 pills daily of the lasix and will come in on Monday Decrease salt/water.  EKG shows PRWP but no ST changes or Q waves.  Will schedule for echo and possible stress echo. Last cardiolite 2009 EF 52%,  normal EKG today, several risk factors for CHF/CAD including HTN, poorly controlled DM, morbidly obese, sedatary lifestyle, CKD, hyperlipidemia, unknown family history adopted.  Incontinence-  Dropped into toilet will take home and bring back monday  Obesity- weight loss advised  Hypertension- get on lasix  OVER 40 minutes of exam, counseling, chart review, referral performed   CXR shows mild CHF superimposed on COPD, sent in [place] also COPD exacerbation, will continue lasix BID and follow up Monday.

## 2013-10-09 NOTE — Addendum Note (Signed)
Addended by: Vicie Mutters R on: 10/09/2013 04:46 PM   Modules accepted: Orders

## 2013-10-09 NOTE — Patient Instructions (Signed)
Do the following things EVERYDAY: 1) Weigh yourself in the morning before breakfast. Write it down and keep it in a log. 2) Take your medicines as prescribed 3) Eat low salt foods-Limit salt (sodium) to 2000 mg per day.  4) Stay as active as you can everyday 5) Limit all fluids for the day to less than 2 liters   Heart Failure Heart failure means your heart has trouble pumping blood. This makes it hard for your body to work well. Heart failure is usually a long-term (chronic) condition. You must take good care of yourself and follow your doctor's treatment plan. HOME CARE  Take your heart medicine as told by your doctor.  Do not stop taking medicine unless your doctor tells you to.  Do not skip any dose of medicine.  Refill your medicines before they run out.  Take other medicines only as told by your doctor or pharmacist.  Stay active if told by your doctor. The elderly and people with severe heart failure should talk with a doctor about physical activity.  Eat heart healthy foods. Choose foods that are without trans fat and are low in saturated fat, cholesterol, and salt (sodium). This includes fresh or frozen fruits and vegetables, fish, lean meats, fat-free or low-fat dairy foods, whole grains, and high-fiber foods. Lentils and dried peas and beans (legumes) are also good choices.  Limit salt if told by your doctor.  Cook in a healthy way. Roast, grill, broil, bake, poach, steam, or stir-fry foods.  Limit fluids as told by your doctor.  Weigh yourself every morning. Do this after you pee (urinate) and before you eat breakfast. Write down your weight to give to your doctor.  Take your blood pressure and write it down if your doctor tell you to.  Ask your doctor how to check your pulse. Check your pulse as told.  Lose weight if told by your doctor.  Stop smoking or chewing tobacco. Do not use gum or patches that help you quit without your doctor's approval.  Schedule and  go to doctor visits as told.  Nonpregnant women should have no more than 1 drink a day. Men should have no more than 2 drinks a day. Talk to your doctor about drinking alcohol.  Stop illegal drug use.  Stay current with shots (immunizations).  Manage your health conditions as told by your doctor.  Learn to manage your stress.  Rest when you are tired.  If it is really hot outside:  Avoid intense activities.  Use air conditioning or fans, or get in a cooler place.  Avoid caffeine and alcohol.  Wear loose-fitting, lightweight, and light-colored clothing.  If it is really cold outside:  Avoid intense activities.  Layer your clothing.  Wear mittens or gloves, a hat, and a scarf when going outside.  Avoid alcohol.  Learn about heart failure and get support as needed.  Get help to maintain or improve your quality of life and your ability to care for yourself as needed. GET HELP IF:   You gain 03 lb/1.4 kg or more in 1 day or 05 lb/2.3 kg in a week.  You are more short of breath than usual.  You cannot do your normal activities.  You tire easily.  You cough more than normal, especially with activity.  You have any or more puffiness (swelling) in areas such as your hands, feet, ankles, or belly (abdomen).  You cannot sleep because it is hard to breathe.  You feel like  your heart is beating fast (palpitations).  You get dizzy or lightheaded when you stand up. GET HELP RIGHT AWAY IF:   You have trouble breathing.  There is a change in mental status, such as becoming less alert or not being able to focus.  You have chest pain or discomfort.  You faint. MAKE SURE YOU:   Understand these instructions.  Will watch your condition.  Will get help right away if you are not doing well or get worse. Document Released: 04/17/2008 Document Revised: 11/03/2012 Document Reviewed: 02/07/2012 [facility] Patient Information 2014 [place], [place].

## 2013-10-10 LAB — BRAIN NATRIURETIC PEPTIDE: Brain Natriuretic Peptide: 211.6 pg/mL — ABNORMAL HIGH (ref 0.0–100.0)

## 2013-10-12 ENCOUNTER — Emergency Department (HOSPITAL_COMMUNITY): Payer: Medicare Other

## 2013-10-12 ENCOUNTER — Encounter (HOSPITAL_COMMUNITY): Payer: Self-pay | Admitting: Emergency Medicine

## 2013-10-12 ENCOUNTER — Encounter: Payer: Self-pay | Admitting: Internal Medicine

## 2013-10-12 ENCOUNTER — Inpatient Hospital Stay (HOSPITAL_COMMUNITY): Payer: Medicare Other

## 2013-10-12 ENCOUNTER — Inpatient Hospital Stay (HOSPITAL_COMMUNITY)
Admission: EM | Admit: 2013-10-12 | Discharge: 2013-10-16 | DRG: 208 | Disposition: A | Discharge status: Home / Self Care | Payer: Medicare Other | Attending: Internal Medicine | Admitting: Internal Medicine

## 2013-10-12 ENCOUNTER — Encounter: Payer: Self-pay | Admitting: Physician Assistant

## 2013-10-12 ENCOUNTER — Ambulatory Visit (INDEPENDENT_AMBULATORY_CARE_PROVIDER_SITE_OTHER): Payer: Medicare Other | Admitting: Physician Assistant

## 2013-10-12 VITALS — BP 132/70 | HR 64 | Temp 97.9°F | Resp 16 | Wt 234.0 lb

## 2013-10-12 DIAGNOSIS — R031 Nonspecific low blood-pressure reading: Secondary | ICD-10-CM | POA: Diagnosis present

## 2013-10-12 DIAGNOSIS — Z6841 Body Mass Index (BMI) 40.0 and over, adult: Secondary | ICD-10-CM | POA: Diagnosis not present

## 2013-10-12 DIAGNOSIS — H5316 Psychophysical visual disturbances: Secondary | ICD-10-CM | POA: Diagnosis present

## 2013-10-12 DIAGNOSIS — M8580 Other specified disorders of bone density and structure, unspecified site: Secondary | ICD-10-CM

## 2013-10-12 DIAGNOSIS — R4182 Altered mental status, unspecified: Secondary | ICD-10-CM

## 2013-10-12 DIAGNOSIS — G9341 Metabolic encephalopathy: Secondary | ICD-10-CM | POA: Diagnosis present

## 2013-10-12 DIAGNOSIS — H269 Unspecified cataract: Secondary | ICD-10-CM

## 2013-10-12 DIAGNOSIS — M949 Disorder of cartilage, unspecified: Secondary | ICD-10-CM

## 2013-10-12 DIAGNOSIS — E559 Vitamin D deficiency, unspecified: Secondary | ICD-10-CM | POA: Diagnosis present

## 2013-10-12 DIAGNOSIS — M899 Disorder of bone, unspecified: Secondary | ICD-10-CM | POA: Diagnosis present

## 2013-10-12 DIAGNOSIS — M549 Dorsalgia, unspecified: Secondary | ICD-10-CM | POA: Diagnosis present

## 2013-10-12 DIAGNOSIS — D72829 Elevated white blood cell count, unspecified: Secondary | ICD-10-CM | POA: Diagnosis present

## 2013-10-12 DIAGNOSIS — M810 Age-related osteoporosis without current pathological fracture: Secondary | ICD-10-CM

## 2013-10-12 DIAGNOSIS — D649 Anemia, unspecified: Secondary | ICD-10-CM

## 2013-10-12 DIAGNOSIS — Z981 Arthrodesis status: Secondary | ICD-10-CM

## 2013-10-12 DIAGNOSIS — Z888 Allergy status to other drugs, medicaments and biological substances status: Secondary | ICD-10-CM | POA: Diagnosis not present

## 2013-10-12 DIAGNOSIS — R509 Fever, unspecified: Secondary | ICD-10-CM | POA: Diagnosis present

## 2013-10-12 DIAGNOSIS — E119 Type 2 diabetes mellitus without complications: Secondary | ICD-10-CM

## 2013-10-12 DIAGNOSIS — F32A Depression, unspecified: Secondary | ICD-10-CM

## 2013-10-12 DIAGNOSIS — E039 Hypothyroidism, unspecified: Secondary | ICD-10-CM | POA: Diagnosis present

## 2013-10-12 DIAGNOSIS — Z885 Allergy status to narcotic agent status: Secondary | ICD-10-CM | POA: Diagnosis not present

## 2013-10-12 DIAGNOSIS — Z7982 Long term (current) use of aspirin: Secondary | ICD-10-CM | POA: Diagnosis not present

## 2013-10-12 DIAGNOSIS — G894 Chronic pain syndrome: Secondary | ICD-10-CM | POA: Diagnosis present

## 2013-10-12 DIAGNOSIS — R9431 Abnormal electrocardiogram [ECG] [EKG]: Secondary | ICD-10-CM

## 2013-10-12 DIAGNOSIS — I499 Cardiac arrhythmia, unspecified: Secondary | ICD-10-CM | POA: Diagnosis present

## 2013-10-12 DIAGNOSIS — E662 Morbid (severe) obesity with alveolar hypoventilation: Secondary | ICD-10-CM | POA: Diagnosis present

## 2013-10-12 DIAGNOSIS — I509 Heart failure, unspecified: Secondary | ICD-10-CM | POA: Diagnosis present

## 2013-10-12 DIAGNOSIS — N179 Acute kidney failure, unspecified: Secondary | ICD-10-CM | POA: Diagnosis present

## 2013-10-12 DIAGNOSIS — J9601 Acute respiratory failure with hypoxia: Secondary | ICD-10-CM

## 2013-10-12 DIAGNOSIS — I2789 Other specified pulmonary heart diseases: Secondary | ICD-10-CM | POA: Diagnosis present

## 2013-10-12 DIAGNOSIS — J9600 Acute respiratory failure, unspecified whether with hypoxia or hypercapnia: Secondary | ICD-10-CM | POA: Diagnosis present

## 2013-10-12 DIAGNOSIS — E785 Hyperlipidemia, unspecified: Secondary | ICD-10-CM

## 2013-10-12 DIAGNOSIS — Z79899 Other long term (current) drug therapy: Secondary | ICD-10-CM | POA: Diagnosis not present

## 2013-10-12 DIAGNOSIS — F319 Bipolar disorder, unspecified: Secondary | ICD-10-CM

## 2013-10-12 DIAGNOSIS — J189 Pneumonia, unspecified organism: Secondary | ICD-10-CM

## 2013-10-12 DIAGNOSIS — J90 Pleural effusion, not elsewhere classified: Secondary | ICD-10-CM

## 2013-10-12 DIAGNOSIS — G9340 Encephalopathy, unspecified: Secondary | ICD-10-CM

## 2013-10-12 DIAGNOSIS — I10 Essential (primary) hypertension: Secondary | ICD-10-CM

## 2013-10-12 DIAGNOSIS — E1129 Type 2 diabetes mellitus with other diabetic kidney complication: Secondary | ICD-10-CM | POA: Diagnosis present

## 2013-10-12 DIAGNOSIS — I5031 Acute diastolic (congestive) heart failure: Secondary | ICD-10-CM | POA: Diagnosis present

## 2013-10-12 DIAGNOSIS — J811 Chronic pulmonary edema: Secondary | ICD-10-CM

## 2013-10-12 DIAGNOSIS — F329 Major depressive disorder, single episode, unspecified: Secondary | ICD-10-CM

## 2013-10-12 LAB — BLOOD GAS, ARTERIAL
Acid-Base Excess: 4.6 mmol/L — ABNORMAL HIGH (ref 0.0–2.0)
Acid-Base Excess: 5 mmol/L — ABNORMAL HIGH (ref 0.0–2.0)
Bicarbonate: 31.9 mEq/L — ABNORMAL HIGH (ref 20.0–24.0)
Bicarbonate: 31.5 mEq/L — ABNORMAL HIGH (ref 20.0–24.0)
Drawn by: 312761
Drawn by: 31101
Expiratory PAP: 6
FIO2: 0.5 %
Inspiratory PAP: 12
Mode: POSITIVE
O2 Content: 4 L/min
O2 Saturation: 95.5 %
O2 Saturation: 95.3 %
Patient temperature: 98.6
Patient temperature: 98.6
RATE: 15 resp/min
TCO2: 34.4 mmol/L (ref 0–100)
TCO2: 33.7 mmol/L (ref 0–100)
pCO2 arterial: 81 mmHg (ref 35.0–45.0)
pCO2 arterial: 71 mmHg (ref 35.0–45.0)
pH, Arterial: 7.22 — ABNORMAL LOW (ref 7.350–7.450)
pH, Arterial: 7.269 — ABNORMAL LOW (ref 7.350–7.450)
pO2, Arterial: 90.6 mmHg (ref 80.0–100.0)
pO2, Arterial: 84.7 mmHg (ref 80.0–100.0)

## 2013-10-12 LAB — CBC
HEMATOCRIT: 31.8 % — AB (ref 36.0–46.0)
Hemoglobin: 10.2 g/dL — ABNORMAL LOW (ref 12.0–15.0)
MCH: 29.6 pg (ref 26.0–34.0)
MCHC: 32.1 g/dL (ref 30.0–36.0)
MCV: 92.2 fL (ref 78.0–100.0)
Platelets: 283 10*3/uL (ref 150–400)
RBC: 3.45 MIL/uL — AB (ref 3.87–5.11)
RDW: 14.5 % (ref 11.5–15.5)
WBC: 4.9 10*3/uL (ref 4.0–10.5)

## 2013-10-12 LAB — CBC WITH DIFFERENTIAL/PLATELET
Basophils Absolute: 0 10*3/uL (ref 0.0–0.1)
Basophils Relative: 0 % (ref 0–1)
EOS ABS: 0 10*3/uL (ref 0.0–0.7)
Eosinophils Relative: 1 % (ref 0–5)
HCT: 33.6 % — ABNORMAL LOW (ref 36.0–46.0)
HEMOGLOBIN: 10.6 g/dL — AB (ref 12.0–15.0)
Lymphocytes Relative: 24 % (ref 12–46)
Lymphs Abs: 1.4 10*3/uL (ref 0.7–4.0)
MCH: 28.7 pg (ref 26.0–34.0)
MCHC: 31.5 g/dL (ref 30.0–36.0)
MCV: 91.1 fL (ref 78.0–100.0)
MONOS PCT: 7 % (ref 3–12)
Monocytes Absolute: 0.4 10*3/uL (ref 0.1–1.0)
Neutro Abs: 4.1 10*3/uL (ref 1.7–7.7)
Neutrophils Relative %: 68 % (ref 43–77)
PLATELETS: 297 10*3/uL (ref 150–400)
RBC: 3.69 MIL/uL — ABNORMAL LOW (ref 3.87–5.11)
RDW: 14.4 % (ref 11.5–15.5)
WBC: 5.9 10*3/uL (ref 4.0–10.5)

## 2013-10-12 LAB — COMPREHENSIVE METABOLIC PANEL
ALT: 53 U/L — ABNORMAL HIGH (ref 0–35)
AST: 53 U/L — AB (ref 0–37)
Albumin: 3.5 g/dL (ref 3.5–5.2)
Alkaline Phosphatase: 78 U/L (ref 39–117)
BUN: 32 mg/dL — AB (ref 6–23)
CO2: 32 mEq/L (ref 19–32)
CREATININE: 1.54 mg/dL — AB (ref 0.50–1.10)
Calcium: 10.2 mg/dL (ref 8.4–10.5)
Chloride: 100 mEq/L (ref 96–112)
GFR calc Af Amer: 39 mL/min — ABNORMAL LOW (ref >90)
GFR calc non Af Amer: 34 mL/min — ABNORMAL LOW (ref >90)
Glucose, Bld: 247 mg/dL — ABNORMAL HIGH (ref 70–99)
POTASSIUM: 4.4 meq/L (ref 3.7–5.3)
Sodium: 142 mEq/L (ref 137–147)
TOTAL PROTEIN: 6.6 g/dL (ref 6.0–8.3)
Total Bilirubin: 0.3 mg/dL (ref 0.3–1.2)

## 2013-10-12 LAB — PROTIME-INR
INR: 1.05 (ref 0.00–1.49)
PROTHROMBIN TIME: 13.5 s (ref 11.6–15.2)

## 2013-10-12 LAB — I-STAT CG4 LACTIC ACID, ED: LACTIC ACID, VENOUS: 1.88 mmol/L (ref 0.5–2.2)

## 2013-10-12 LAB — URINE MICROSCOPIC-ADD ON

## 2013-10-12 LAB — TROPONIN I: Troponin I: <0.3 ng/mL (ref <0.30)

## 2013-10-12 LAB — GRAM STAIN

## 2013-10-12 LAB — URINALYSIS, ROUTINE W REFLEX MICROSCOPIC
Bilirubin Urine: NEGATIVE
GLUCOSE, UA: NEGATIVE mg/dL
Hgb urine dipstick: NEGATIVE
Ketones, ur: NEGATIVE mg/dL
LEUKOCYTES UA: NEGATIVE
NITRITE: NEGATIVE
PH: 5 (ref 5.0–8.0)
Protein, ur: >300 mg/dL — AB
SPECIFIC GRAVITY, URINE: 1.021 (ref 1.005–1.030)
Urobilinogen, UA: 0.2 mg/dL (ref 0.0–1.0)

## 2013-10-12 LAB — CREATININE, SERUM
Creatinine, Ser: 1.6 mg/dL — ABNORMAL HIGH (ref 0.50–1.10)
GFR calc Af Amer: 37 mL/min — ABNORMAL LOW (ref >90)
GFR calc non Af Amer: 32 mL/min — ABNORMAL LOW (ref >90)

## 2013-10-12 LAB — GLUCOSE, CAPILLARY
GLUCOSE-CAPILLARY: 236 mg/dL — AB (ref 70–99)
Glucose-Capillary: 230 mg/dL — ABNORMAL HIGH (ref 70–99)
Glucose-Capillary: 187 mg/dL — ABNORMAL HIGH (ref 70–99)

## 2013-10-12 LAB — PRO B NATRIURETIC PEPTIDE: PRO B NATRI PEPTIDE: 9728 pg/mL — AB (ref 0–125)

## 2013-10-12 LAB — CBG MONITORING, ED: GLUCOSE-CAPILLARY: 235 mg/dL — AB (ref 70–99)

## 2013-10-12 MED ORDER — DEXTROSE 5 % IV SOLN
1.0000 g | Freq: Once | INTRAVENOUS | Status: AC
  Administered 2013-10-12: 1 g via INTRAVENOUS
  Filled 2013-10-12: qty 10

## 2013-10-12 MED ORDER — MIDAZOLAM HCL 2 MG/2ML IJ SOLN
INTRAMUSCULAR | Status: AC
  Administered 2013-10-12: 2 mg via INTRAVENOUS
  Filled 2013-10-12: qty 4

## 2013-10-12 MED ORDER — SODIUM CHLORIDE 0.9 % IJ SOLN
3.0000 mL | INTRAMUSCULAR | Status: DC | PRN
  Administered 2013-10-14: 3 mL via INTRAVENOUS

## 2013-10-12 MED ORDER — FENTANYL CITRATE 0.05 MG/ML IJ SOLN
INTRAMUSCULAR | Status: AC
  Filled 2013-10-12: qty 4

## 2013-10-12 MED ORDER — LATANOPROST 0.005 % OP SOLN
1.0000 [drp] | Freq: Every day | OPHTHALMIC | Status: DC
  Administered 2013-10-15: 1 [drp] via OPHTHALMIC
  Filled 2013-10-12 (×3): qty 2.5

## 2013-10-12 MED ORDER — PROPOFOL 10 MG/ML IV EMUL: 0.0000 ug/kg/min | INTRAVENOUS | Status: DC

## 2013-10-12 MED ORDER — MIDAZOLAM HCL 2 MG/2ML IJ SOLN
1.0000 mg | INTRAMUSCULAR | Status: DC | PRN
Start: 2013-10-12 — End: 2013-10-13
  Administered 2013-10-13: 1 mg via INTRAVENOUS
  Filled 2013-10-12: qty 2

## 2013-10-12 MED ORDER — FENTANYL CITRATE 0.05 MG/ML IJ SOLN: 50.0000 ug | INTRAMUSCULAR | Status: DC | PRN

## 2013-10-12 MED ORDER — ETOMIDATE 2 MG/ML IV SOLN
0.3000 mg/kg | Freq: Once | INTRAVENOUS | Status: AC
  Administered 2013-10-12: 20 mg via INTRAVENOUS

## 2013-10-12 MED ORDER — HEPARIN SODIUM (PORCINE) 5000 UNIT/ML IJ SOLN
5000.0000 [IU] | Freq: Three times a day (TID) | INTRAMUSCULAR | Status: DC
  Administered 2013-10-13: 5000 [IU] via SUBCUTANEOUS
  Filled 2013-10-12 (×6): qty 1

## 2013-10-12 MED ORDER — FENTANYL CITRATE 0.05 MG/ML IJ SOLN
100.0000 ug | Freq: Once | INTRAMUSCULAR | Status: AC
  Administered 2013-10-12: 100 ug via INTRAVENOUS

## 2013-10-12 MED ORDER — CARVEDILOL 3.125 MG PO TABS
3.1250 mg | ORAL_TABLET | Freq: Two times a day (BID) | ORAL | Status: DC
  Filled 2013-10-12 (×3): qty 1

## 2013-10-12 MED ORDER — ASPIRIN 81 MG PO CHEW
81.0000 mg | CHEWABLE_TABLET | Freq: Every day | ORAL | Status: DC
  Administered 2013-10-13 – 2013-10-16 (×4): 81 mg via ORAL
  Filled 2013-10-12 (×5): qty 1

## 2013-10-12 MED ORDER — VORTIOXETINE HBR 10 MG PO TABS
10.0000 mg | ORAL_TABLET | Freq: Every day | ORAL | Status: DC
  Administered 2013-10-13 – 2013-10-16 (×4): 10 mg via ORAL
  Filled 2013-10-12 (×5): qty 1

## 2013-10-12 MED ORDER — DORZOLAMIDE HCL 2 % OP SOLN
1.0000 [drp] | Freq: Two times a day (BID) | OPHTHALMIC | Status: DC
  Administered 2013-10-13 – 2013-10-16 (×7): 1 [drp] via OPHTHALMIC
  Filled 2013-10-12 (×2): qty 10

## 2013-10-12 MED ORDER — INSULIN ASPART 100 UNIT/ML ~~LOC~~ SOLN
0.0000 [IU] | SUBCUTANEOUS | Status: DC
  Administered 2013-10-12: 3 [IU] via SUBCUTANEOUS
  Administered 2013-10-13: 2 [IU] via SUBCUTANEOUS
  Administered 2013-10-13: 1 [IU] via SUBCUTANEOUS

## 2013-10-12 MED ORDER — LOSARTAN POTASSIUM 50 MG PO TABS
100.0000 mg | ORAL_TABLET | Freq: Every day | ORAL | Status: DC
  Filled 2013-10-12: qty 2

## 2013-10-12 MED ORDER — MIDAZOLAM HCL 2 MG/2ML IJ SOLN: 1.0000 mg | INTRAMUSCULAR | Status: DC | PRN

## 2013-10-12 MED ORDER — FUROSEMIDE 10 MG/ML IJ SOLN
20.0000 mg | Freq: Once | INTRAMUSCULAR | Status: AC
  Administered 2013-10-12: 20 mg via INTRAVENOUS
  Filled 2013-10-12: qty 2

## 2013-10-12 MED ORDER — MIDAZOLAM HCL 2 MG/2ML IJ SOLN
2.0000 mg | Freq: Once | INTRAMUSCULAR | Status: AC
  Administered 2013-10-12: 2 mg via INTRAVENOUS

## 2013-10-12 MED ORDER — FUROSEMIDE 10 MG/ML IJ SOLN
4.0000 mg/h | INTRAVENOUS | Status: DC
  Administered 2013-10-12: 4 mg/h via INTRAVENOUS
  Filled 2013-10-12: qty 25

## 2013-10-12 MED ORDER — PANTOPRAZOLE SODIUM 40 MG IV SOLR
40.0000 mg | INTRAVENOUS | Status: DC
  Administered 2013-10-13 – 2013-10-14 (×2): 40 mg via INTRAVENOUS
  Filled 2013-10-12 (×3): qty 40

## 2013-10-12 MED ORDER — FUROSEMIDE 10 MG/ML IJ SOLN: 60.0000 mg | Freq: Once | INTRAMUSCULAR | Status: DC

## 2013-10-12 MED ORDER — SODIUM CHLORIDE 0.9 % IJ SOLN
3.0000 mL | Freq: Two times a day (BID) | INTRAMUSCULAR | Status: DC
  Administered 2013-10-12 – 2013-10-16 (×8): 3 mL via INTRAVENOUS

## 2013-10-12 MED ORDER — AZITHROMYCIN 500 MG IV SOLR
500.0000 mg | Freq: Once | INTRAVENOUS | Status: AC
  Administered 2013-10-12: 500 mg via INTRAVENOUS

## 2013-10-12 MED ORDER — LEVOTHYROXINE SODIUM 50 MCG PO TABS
50.0000 ug | ORAL_TABLET | Freq: Every day | ORAL | Status: DC
  Administered 2013-10-13 – 2013-10-16 (×4): 50 ug via ORAL
  Filled 2013-10-12 (×5): qty 1

## 2013-10-12 MED ORDER — NOREPINEPHRINE BITARTRATE 1 MG/ML IJ SOLN
2.0000 ug/min | INTRAMUSCULAR | Status: DC
  Administered 2013-10-12: 5 ug/min via INTRAVENOUS
  Filled 2013-10-12: qty 16

## 2013-10-12 MED ORDER — SODIUM CHLORIDE 0.9 % IV SOLN: 250.0000 mL | INTRAVENOUS | Status: DC | PRN

## 2013-10-12 MED ORDER — BRIMONIDINE TARTRATE 0.2 % OP SOLN
1.0000 [drp] | Freq: Three times a day (TID) | OPHTHALMIC | Status: DC
  Administered 2013-10-13 – 2013-10-16 (×9): 1 [drp] via OPHTHALMIC
  Filled 2013-10-12 (×2): qty 5

## 2013-10-12 MED ORDER — MAGNESIUM OXIDE 400 MG PO TABS
400.0000 mg | ORAL_TABLET | Freq: Two times a day (BID) | ORAL | Status: DC
  Administered 2013-10-13 – 2013-10-16 (×7): 400 mg via ORAL
  Filled 2013-10-12 (×11): qty 1

## 2013-10-12 MED ORDER — PROPOFOL 10 MG/ML IV EMUL
INTRAVENOUS | Status: AC
  Administered 2013-10-13: 10 ug/kg/min via INTRAVENOUS
  Filled 2013-10-12: qty 100

## 2013-10-12 MED ORDER — ATROPINE SULFATE 0.1 MG/ML IJ SOLN
INTRAMUSCULAR | Status: AC
  Filled 2013-10-12: qty 10

## 2013-10-12 MED ORDER — ATORVASTATIN CALCIUM 10 MG PO TABS
10.0000 mg | ORAL_TABLET | Freq: Every day | ORAL | Status: DC
  Administered 2013-10-13 – 2013-10-15 (×3): 10 mg via ORAL
  Filled 2013-10-12 (×4): qty 1

## 2013-10-12 NOTE — Procedures (Signed)
Intubation Procedure Note Alyssa [name] [id number] 05-27-1946  Procedure: Intubation Indications: Respiratory insufficiency  Procedure Details Consent: Risks of procedure as well as the alternatives and risks of each were explained to the (patient/caregiver).  Consent for procedure obtained. Time Out: Verified patient identification, verified procedure, site/side was marked, verified correct patient position, special equipment/implants available, medications/allergies/relevent history reviewed, required imaging and test results available.  Performed  Maximum sterile technique was used including antiseptics, cap, gloves, gown, hand hygiene, mask and sheet.  MAC and 3    Evaluation Hemodynamic Status: Transient hypotension treated with fluid; O2 sats: stable throughout Patient's Current Condition: stable Complications: No apparent complications Patient did tolerate procedure well. Chest X-ray ordered to verify placement.  CXR: pending.   Procedure performed under direct supervision of Dr. Lake Bells.  Noe Gens, NP-C [facility] Pgr: [phone] or [phone]    10/12/2013  Attending:   Simonne Maffucci [facility] Pager: [phone] Cell: [phone] If no response, call [phone]

## 2013-10-12 NOTE — Progress Notes (Signed)
Called by bedside RN at [facility] regarding patient's respiratory status, MD had already been notified and orders received for ABG and MD would be to bedside shortly. Arrived at bedside, while RT obtaining ABG, patient arouses to name, but quickly falls back asleep. Fine crackles noted in all lobes, skin warm and dry. Sats 99% on 4L. ABG results called to nurse and NP. Assisted in obtaining SDU bed for patient, patient to transfer to [facility] for BIPAP.

## 2013-10-12 NOTE — Progress Notes (Signed)
Paged CCMD for pt's tele strip.

## 2013-10-12 NOTE — Consult Note (Signed)
PULMONARY / CRITICAL CARE MEDICINE   Name: Alyssa [name] MRN: [id number] DOB: 07/18/1946    ADMISSION DATE:  10/12/2013 CONSULTATION DATE:  10/12/13  REFERRING MD :  [facility] / Dr. Aileen Fass PRIMARY SERVICE: [facility] -->PCCM  CHIEF COMPLAINT:  AMS  BRIEF PATIENT DESCRIPTION:  68 y/o F admitted on 3/23 per [facility] with AMS & acute hypoxic respiratory failure thought related to CHF.  Patient progressively became more lethargic with pCO2 >80 & tx to ICU requiring intubation.    SIGNIFICANT EVENTS / STUDIES:  3/23 - admit with AMS & acute hypoxic respiratory failure thought related to CHF.  Patient progressively became more lethargic with pCO2 >80 & tx to ICU requiring intubation.     LINES / TUBES: OETT 3/23>>>  CULTURES: UA 3/23>>>neg nitrate, rare bacteria UC 3/23>>>  ANTIBIOTICS: Rocephin 3/23 x1 Azithro 3/23 x1   HISTORY OF PRESENT ILLNESS:  68 y/o F with PMH of morbid obesity, bipolar disorder, anemia, chronic back pain s/p fusion, DM, hypothyroidism, HTN, HLD who was seen by her PCP on 3/20 for increasing SOB & elevated blood pressure after recent colonoscopy (3/18 with 3 polyps removed).  Pt reported at that time that she was having difficulty with increasing SOB, especially with lying flat and swelling in her lower extremities up to her abdomen.  Office notes reflect a 12 lb weight gain.  She was treated with a zpak for possible COPD exacerbation,  an increase in lasix dosing and instructed to return on 3/23.  She returned to PCP on 3/23 and was found to be hypoxic with saturations of 81% on RA with decreased breath sounds, lethargy and confusion.  She was transported to [facility] ER and admitted per [facility].     ER evaluation noted the patient to be afebrile, negative WBC, cr 1.5, lactate of 1.8 and BNP of 9700.  She was treated with rocephin & azithromycin in ER.  Patient had progressive decline in respiratory status and was transferred to ICU per RRT.  Initial ABG noted hypercarbic respiratory  failure.  PCCM consulted.  Patient declined in ICU requiring intubation.    PAST MEDICAL HISTORY :  Past Medical History  Diagnosis Date  . Osteopenia   . Osteoporosis   . Diabetes mellitus without complication   . Hypertension   . Bipolar 1 disorder   . Hyperlipidemia   . Anemia   . Cataract   . Unspecified vitamin D deficiency   . Glaucoma   . Chronic back pain   . Hypothyroidism    Past Surgical History  Procedure Laterality Date  . Back surgery  2007  . Knee arthroscopy Bilateral   . Dilation and curettage of uterus    . Breast surgery Left     CYST REMOVAL  . Lumbar fusion  2009   Prior to Admission medications   Medication Sig Start Date End Date Taking? Authorizing Provider  aspirin 81 MG tablet Take 81 mg by mouth daily.   Yes Historical Provider, MD  atenolol (TENORMIN) 50 MG tablet Take 1 tablet (50 mg total) by mouth 2 (two) times daily. 09/17/13  Yes Melissa R Smith, PA-C  azithromycin (ZITHROMAX) 250 MG tablet Take 250 mg by mouth daily. Started 10/12/13, for 4 days,ending 10/15/13   Yes Historical Provider, MD  brimonidine (ALPHAGAN P) 0.1 % SOLN Place 1 drop into both eyes 2 (two) times daily.    Yes Historical Provider, MD  Cholecalciferol (VITAMIN D-3 PO) Take 5,000 Units by mouth every evening.  Yes Historical Provider, MD  dorzolamide (TRUSOPT) 2 % ophthalmic solution Place 1 drop into both eyes 2 (two) times daily.   Yes Historical Provider, MD  furosemide (LASIX) 40 MG tablet Take 40 mg by mouth daily.   Yes Historical Provider, MD  gabapentin (NEURONTIN) 800 MG tablet Take 800mg  by mouth twice daily 06/28/13  Yes Melissa R Smith, PA-C  glipiZIDE (GLUCOTROL) 10 MG tablet Take 5-10 mg by mouth 2 (two) times daily as needed (only takes if needed, depending on glucose level >200).    Yes Historical Provider, MD  latanoprost (XALATAN) 0.005 % ophthalmic solution Place 1 drop into the right eye at bedtime.   Yes Historical Provider, MD  levothyroxine (SYNTHROID)  50 MCG tablet Take 1 tablet (50 mcg total) by mouth daily before breakfast. 07/15/13  Yes Unk Pinto, MD  losartan (COZAAR) 100 MG tablet Take 100 mg by mouth daily.   Yes Historical Provider, MD  magnesium oxide (MAG-OX) 400 MG tablet Take 1 tablet (400 mg total) by mouth 2 (two) times daily. 09/17/13  Yes Melissa R Smith, PA-C  metFORMIN (GLUCOPHAGE-XR) 500 MG 24 hr tablet Take 1,000 mg by mouth 2 (two) times daily.   Yes Historical Provider, MD  minoxidil (LONITEN) 10 MG tablet Take 10 mg by mouth daily. 09/17/13  Yes Melissa R Smith, PA-C  Multiple Vitamin (MULTIVITAMIN) tablet Take 1 tablet by mouth daily.   Yes Historical Provider, MD  Omega-3 Fatty Acids (FISH OIL) 1000 MG CAPS Take 1,000 mg by mouth every evening.    Yes Historical Provider, MD  Probiotic Product (PROBIOTIC DAILY PO) Take 1 tablet by mouth daily.    Yes Historical Provider, MD  rosuvastatin (CRESTOR) 5 MG tablet Take 5 mg by mouth daily.   Yes Historical Provider, MD  traZODone (DESYREL) 150 MG tablet Take 150 mg by mouth at bedtime.    Yes Historical Provider, MD  vitamin B-12 (CYANOCOBALAMIN) 1000 MCG tablet Take 1,000 mcg by mouth daily.   Yes Historical Provider, MD  Vortioxetine HBr (BRINTELLIX) 10 MG TABS Take 10 mg by mouth daily.   Yes Historical Provider, MD   Allergies  Allergen Reactions  . Ciprofloxacin Hcl Diarrhea  . Cymbalta [Duloxetine Hcl] Other (See Comments)    Per pt: it did not work  . Codeine Other (See Comments)    Per pt: unknown  . Lortab [Hydrocodone-Acetaminophen] Other (See Comments)    Per pt: unknown  . Oxycodone Other (See Comments)    Per pt: unknown    FAMILY HISTORY:  Family History  Problem Relation Age of Onset  . Adopted: Yes   SOCIAL HISTORY:  reports that she has never smoked. She has never used smokeless tobacco. She reports that she does not drink alcohol or use illicit drugs.  REVIEW OF SYSTEMS:  Unable to complete as pt is altered on vent  SUBJECTIVE:   VITAL  SIGNS: Temp:  [97.3 F (36.3 C)-98.4 F (36.9 C)] 97.5 F (36.4 C) (03/23 2026) Pulse Rate:  [42-67] 43 (03/23 2247) Resp:  [14-22] 17 (03/23 2247) BP: (87-165)/(41-80) 87/41 mmHg (03/23 2247) SpO2:  [91 %-100 %] 100 % (03/23 2247) FiO2 (%):  [50 %] 50 % (03/23 2247) Weight:  [224 lb (101.606 kg)-234 lb (106.142 kg)] 224 lb (101.606 kg) (03/23 1503)  HEMODYNAMICS:    VENTILATOR SETTINGS: Vent Mode:  [-] PRVC FiO2 (%):  [50 %] 50 % Set Rate:  [14 bmp-15 bmp] 14 bmp Vt Set:  [500 mL] 500 mL PEEP:  [  [place] Pressure:  [25 cmH20] 25 cmH20  INTAKE / OUTPUT: Intake/Output     03/23 0701 - 03/24 0700   Urine (mL/kg/hr) 50   Total Output 50   Net -50         PHYSICAL EXAMINATION: General:  Morbidly obese female Neuro:  Obtunded on bipap, responds to pain HEENT:  Mm pink/moist, poor dentition, short / thick neck Cardiovascular:  s1s2 distant, brady Lungs:  resp's even/non-labored, lungs bilaterally with crackles Abdomen:  Obese, soft, bsx4 active Musculoskeletal:  No acute deformities Skin:  Warm/dry, 2+ pitting edema in BLE  LABS:  CBC  Recent Labs Lab 10/09/13 1117 10/12/13 1527 10/12/13 1925  WBC 7.1 5.9 4.9  HGB 10.7* 10.6* 10.2*  HCT 32.8* 33.6* 31.8*  PLT 311 297 283   Coag's  Recent Labs Lab 10/12/13 1925  INR 1.05   BMET  Recent Labs Lab 10/09/13 1117 10/12/13 1527 10/12/13 1925  NA 142 142  --   K 4.4 4.4  --   CL 100 100  --   CO2 32 32  --   BUN 19 32*  --   CREATININE 1.28* 1.54* 1.60*  GLUCOSE 145* 247*  --    Electrolytes  Recent Labs Lab 10/09/13 1117 10/12/13 1527  CALCIUM 10.0 10.2   Sepsis Markers  Recent Labs Lab 10/12/13 1602  LATICACIDVEN 1.88   ABG  Recent Labs Lab 10/12/13 2015 10/12/13 2204  PHART 7.220* 7.269*  PCO2ART 81.0* 71.0*  PO2ART 90.6 84.7   Liver Enzymes  Recent Labs Lab 10/09/13 1117 10/12/13 1527  AST 49* 53*  ALT 42* 53*  ALKPHOS 65 78  BILITOT 0.5 0.3   ALBUMIN 3.9 3.5   Cardiac Enzymes  Recent Labs Lab 10/12/13 1528 10/12/13 1925  TROPONINI  --  <0.30  PROBNP 9728.0*  --    Glucose  Recent Labs Lab 10/07/13 0921 10/07/13 1027 10/12/13 1534 10/12/13 1829 10/12/13 1951 10/12/13 2121  GLUCAP 81 140* 235* 236* 230* 187*    Imaging Dg Chest 2 View  10/12/2013   CLINICAL DATA:  Shortness of Breath  EXAM: CHEST  2 VIEW  COMPARISON:  10/09/2013  FINDINGS: Study is limited by poor inspiration. Cardiomediastinal silhouette is stable. There is small right pleural effusion with right basilar atelectasis or infiltrate. Trace left basilar atelectasis or infiltrate. Central vascular congestion without convincing pulmonary edema.  IMPRESSION: Small right pleural effusion with right basilar atelectasis or infiltrate. No pulmonary edema. Trace left basilar atelectasis or infiltrate. Limited study by poor inspiration.   Electronically Signed   By: Lahoma Crocker M.D.   On: 10/12/2013 16:43   Ct Head Wo Contrast  10/12/2013   CLINICAL DATA:  Altered mental status, lethargy, confusion  EXAM: CT HEAD WITHOUT CONTRAST  TECHNIQUE: Contiguous axial images were obtained from the base of the skull through the vertex without intravenous contrast.  COMPARISON:  None.  FINDINGS: No skull fracture is noted. Paranasal sinuses and mastoid air cells are unremarkable.  Moderate cerebral atrophy. Periventricular and patchy subcortical white matter decreased attenuation probable due to chronic small vessel ischemic changes. No intracranial hemorrhage, mass effect or midline shift. No acute cortical infarction. No mass lesion is noted on this unenhanced scan.  IMPRESSION: No acute intracranial abnormality. Moderate cerebral atrophy. Periventricular and patchy subcortical white matter decreased attenuation probable due to chronic small vessel ischemic changes.   Electronically Signed   By: Lahoma Crocker M.D.   On: 10/12/2013 17:04    ASSESSMENT /  PLAN:  PULMONARY A: Acute  Hypercarbic Respiratory Failure L Pleural Effusion  P:   -full vent support, rate 18 with f/u abg -consider rate reduction as pt wakes -wean PEEP / FiO2 for sats > 93% -daily SBT / WUA -PRN fentanyl  -trend cxr  CARDIOVASCULAR A:  Decompensated CHF - ECHO pending Bradycardia HTN HLD P:  -eval ECHO  -continue lasix gtt -assess EKG -cycle troponin -cozaar 100 mg QD, coreg, ASA, Lipitor  RENAL A:   Acute Kidney Injury - baseline sr cr ~ 1 P:   -trend BMP -replace lytes as indicated  GASTROINTESTINAL A:   Morbid Obesity P:   -NPO -PPI  HEMATOLOGIC A:   Anemia - no evidence of acute bleeding P:  -trend CBC -Heparin for DVT proph  INFECTIOUS A:   LLL Effusion - low suspicion for CAP (no fever, wbc or subjective hx from family to support) P:   -monitor fever curve / leukocytosis -hold abx for now, Rx for CAP in ER with azithro / rocephin  ENDOCRINE A:   DM   Hypothyroidism - most recent TSH 10/01/13 4.004 P:   -SSI -continue synthroid  NEUROLOGIC A:   Acute Metabolic Encephalopathy  Chronic Pain  Bipolar Disorder P:   -PRN fentanyl  -minimize sedation as able    GLOBAL: Family updated at bedside.    Noe Gens, NP-C [facility] Pgr: [phone] or [phone]  Attending:  I have seen and examined the patient with nurse practitioner/resident and agree with the note above.   Overall picture seems most consistent with decompensated CHF, less likely an infectious etiology.  Need echo.  She definitely has obesity hypoventilation.  Continue diuresis for respiratory failure and pleural effusions.  I have personally obtained a history, examined the patient, evaluated laboratory and imaging results, formulated the assessment and plan and placed orders.  CRITICAL CARE: The patient is critically ill with multiple organ systems failure and requires high complexity decision making for assessment and support, frequent evaluation and  titration of therapies, application of advanced monitoring technologies and extensive interpretation of multiple databases. Critical Care Time devoted to patient care services described in this note is 45 minutes.   Jillyn Hidden PCCM Pager: [phone] Cell: [phone] If no response, call [phone]   10/12/2013, 10:55 PM

## 2013-10-12 NOTE — ED Notes (Signed)
Attempt to ambulate pt. Pt total assist x2 pt able to bear weight. Spo2 drops to 77 percent. Pt recovered to 94% after approximately 3-4 minutes of resting.

## 2013-10-12 NOTE — ED Notes (Signed)
Alyssa [name] on 5W updated on plan of care.

## 2013-10-12 NOTE — ED Provider Notes (Signed)
CSN: [id number]     Arrival date & time 10/12/13  1454 History   None    Chief Complaint  Patient presents with  . Shortness of Breath   HPI 68 yo F with history of DM and HTN presents complaining of shortness of breath.  She has been feeling bad for about a week.  She has had increasing dsypnea.  Moderate severity.  Worse with minimal exertion.  She has had a cough, non-productive.  She has had fatigue and generalized weakness.  She went to her PCP's office on Friday and was given azithromycin for bronchitis.  She followed up today, and in his office, sats were about 80% and she was transferred to the ED for further management.    She has also been having visual hallucinations.  This just started last week.  Her PCP started her on Brintellix for this.  She has a listed DX in the medical record of Bipolar 1 disorder.  However, the patient has never been told she is bipolar.  Her family also deny having ever been told of this diagnosis.  She has never seen a psychiatrist or psychologist.  She has no history of any known mental health disorder.  She has never before been on an antidepressant or antipsychotic.  She has only been having the hallucinations since she became ill 1 week ago.  The hallucinations include seeing obtjects in the room, such as "strings hanging down from the ceiling above your head" and seeing people that she knows are not there.  She has good insight with regard to the hallucinations.  They wax and wane and seem to be worse at night and early in the morning.  Her level of consciousness has been waxing and waning the last few days per family.    On arrival to the ED, she is afebrile, HR 61, RR 19, BP 157/73, sats 99% on 3 L [place].  No distress noted.  Ill appearing.  Somnolent.   Past Medical History  Diagnosis Date  . Osteopenia   . Osteoporosis   . Diabetes mellitus without complication   . Hypertension   . Bipolar 1 disorder   . Hyperlipidemia   . Anemia   . Cataract   .  Unspecified vitamin D deficiency   . Glaucoma   . Chronic back pain   . Hypothyroidism    Past Surgical History  Procedure Laterality Date  . Back surgery  2007  . Knee arthroscopy Bilateral   . Dilation and curettage of uterus    . Breast surgery Left     CYST REMOVAL  . Lumbar fusion  2009   Family History  Problem Relation Age of Onset  . Adopted: Yes   History  Substance Use Topics  . Smoking status: Never Smoker   . Smokeless tobacco: Never Used  . Alcohol Use: No   OB History   Grav Para Term Preterm Abortions TAB SAB Ect Mult Living                 Review of Systems  Constitutional: Negative for fever, chills and diaphoresis.  HENT: Negative for congestion and rhinorrhea.   Respiratory: Positive for cough and shortness of breath. Negative for wheezing.   Cardiovascular: Negative for chest pain and leg swelling.  Gastrointestinal: Negative for nausea, vomiting, abdominal pain and diarrhea.  Genitourinary: Negative for dysuria, urgency, frequency, flank pain, vaginal bleeding, vaginal discharge and difficulty urinating.  Musculoskeletal: Negative for neck pain and neck stiffness.  Skin: Negative for rash.  Neurological: Negative for weakness, numbness and headaches.  Psychiatric/Behavioral: Positive for hallucinations, confusion, sleep disturbance and decreased concentration.  All other systems reviewed and are negative.      Allergies  Ciprofloxacin hcl; Cymbalta; Codeine; Lortab; and Oxycodone  Home Medications   No current outpatient prescriptions on file. BP 100/43  Pulse 46  Temp(Src) 98.5 F (36.9 C) (Oral)  Resp 18  Ht 5\' 4"  (1.626 m)  Wt 224 lb (101.606 kg)  BMI 38.43 kg/m2  SpO2 99% Physical Exam  Nursing note and vitals reviewed. Constitutional: She is oriented to person, place, and time. She appears well-developed and well-nourished.  Ill appearing, somnolent, increased work of breathing, sats 99% on 3 L , actively having visual  hallucinations, but with good insight  HENT:  Head: Normocephalic and atraumatic.  Mouth/Throat: Oropharynx is clear and moist.  Eyes: Conjunctivae and EOM are normal. Pupils are equal, round, and reactive to light.  Neck: Normal range of motion. Neck supple. No JVD present. No tracheal deviation present.  Cardiovascular: Normal rate, regular rhythm, normal heart sounds and intact distal pulses.  Exam reveals no gallop and no friction rub.   No murmur heard. Pulmonary/Chest: Accessory muscle usage present. No stridor. She has decreased breath sounds. She has wheezes. She has rhonchi. She has rales.  Abdominal: Soft. She exhibits no distension. There is no tenderness. There is no rebound and no guarding.  Musculoskeletal: She exhibits edema (pitting, bilateral lower extremities).  Neurological: She is oriented to person, place, and time. No cranial nerve deficit. She exhibits normal muscle tone. Coordination normal. GCS eye subscore is 4. GCS verbal subscore is 5. GCS motor subscore is 6.  Somnolent; having visual hallucinations with good insight, but oriented X 4.  5/5 strength in bilateral UE and LE.  Normal sensation to light touch throughout.  Skin: Skin is warm and dry. No rash noted. She is not diaphoretic. There is pallor.    ED Course  Procedures (including critical care time) Labs Review Labs Reviewed  CBC WITH DIFFERENTIAL - Abnormal; Notable for the following:    RBC 3.69 (*)    Hemoglobin 10.6 (*)    HCT 33.6 (*)    All other components within normal limits  COMPREHENSIVE METABOLIC PANEL - Abnormal; Notable for the following:    Glucose, Bld 247 (*)    BUN 32 (*)    Creatinine, Ser 1.54 (*)    AST 53 (*)    ALT 53 (*)    GFR calc non Af Amer 34 (*)    GFR calc Af Amer 39 (*)    All other components within normal limits  URINALYSIS, ROUTINE W REFLEX MICROSCOPIC - Abnormal; Notable for the following:    Protein, ur >300 (*)    All other components within normal limits   PRO B NATRIURETIC PEPTIDE - Abnormal; Notable for the following:    Pro B Natriuretic peptide (BNP) 9728.0 (*)    All other components within normal limits  URINE MICROSCOPIC-ADD ON - Abnormal; Notable for the following:    Casts HYALINE CASTS (*)    All other components within normal limits   All other components within normal limits  GRAM STAIN  URINE CULTURE  TSH  T4, FREE  TROPONIN I  TROPONIN I  I-STAT CG4 LACTIC ACID, ED    DG Chest 2 View (Final result)  Result time: 10/12/13 16:43:39    Final result by Rad Results In Interface (10/12/13 16:43:39)  Narrative:   CLINICAL DATA: Shortness of Breath  EXAM: CHEST 2 VIEW  COMPARISON: 10/09/2013  FINDINGS: Study is limited by poor inspiration. Cardiomediastinal silhouette is stable. There is small right pleural effusion with right basilar atelectasis or infiltrate. Trace left basilar atelectasis or infiltrate. Central vascular congestion without convincing pulmonary edema.  IMPRESSION: Small right pleural effusion with right basilar atelectasis or infiltrate. No pulmonary edema. Trace left basilar atelectasis or infiltrate. Limited study by poor inspiration.     MDM   68 yo F presents with cough, new onset hypoxic respiratory failure (now stable on 3 L [place]), and visual hallucinations. Afebrile, mild to moderate HTN, sats 99% on 3 L [place] (new requirement), HR 60s.  Ill appearing.  Increased work of breathing.  Wheezes, rales, rhonci.  Pitting edema of LE.  CXR demonstrates R sided pleural effusion and diffuse infiltrates.  Multifocal PNA vs pulmonary edema.  I think this is largely CHF, but likely exacerbated in part by infectious process.  Covered with Rocephin and Azithromycin for CAP.  Gave 20 mg IV lasix.  She has no history of heart failure.  No chest pain.  EKG non-ischemic.  Troponin negative.  BNP 9000.  Creatinine 1.54.  All labs and imaging reviewed.  Her psychotic features are organic in nature, delirium in  the setting of acute illness.  I do not attribute this to bipolar disorder or other primary psychiatric process and would question this diagnosis, but will defer to the patient's PCP who is more familiar with patient's history.  Admitted to medicine for further management.  Final diagnoses:  Acute respiratory failure with hypoxia  Acute kidney injury  Diabetes mellitus without complication  Heart failure of unknown type  Hyperlipidemia  Hypertension  Acute encephalopathy  Acute respiratory failure      Wendall Papa, MD 10/13/13 1652

## 2013-10-12 NOTE — Progress Notes (Signed)
Report received from ED RN for admission to [phone]

## 2013-10-12 NOTE — ED Notes (Signed)
Admitting physician at bedside

## 2013-10-12 NOTE — ED Notes (Signed)
Pt brought in by EMS from [place] she was having difficulty breathing this am.  Has been ongoing for the last week.  sts had a colonoscopy last week and hasnt felt good since.  Was seen at pcp for same sent home with abx.

## 2013-10-12 NOTE — Progress Notes (Signed)
Event: Notified by RN at approx 1915  that pt lethargic. Will awaken and answer questions but will easily fall back asleep. VSS. 02 sats 96-100% on 2L [place]. No overt respiratory distress. Concern that pt may need a higher level of care. ABG requested. RN ask to call when ABG results return. Notified at 2023 that ABG has resulted. PH-7.22, pC02-81, p02-84.7 w/ bicarb of 31.9. NP to bedside. Subjective: Pt unable to provide information d/t decreased LOC. Family at bedside state pt has been "sleepy" since admission but states she would wake up and talk to them. They feel she is "worse". Objective: Alyssa [name] is a 68 y/o female w/ known past medical h/o HTN, DM II and bipolar disorder. Pt's daughter took pt to PCP Friday d/t increasing SOB. She was placed on po Lasix and sent home. She return to PCP today for f/u and was noted to have 02 sats of 80% on R/A.  Daughter reported her SOB had worsened and she was hallucinating and confused. The pt has had no c/o CP.  PCP sent pt to ED where she was admitted by [facility] for acute hypoxic respiratory failure w/ some concern for CHF. She was placed on a Lasix drip and admitted to telemetry bed. EKG on admission revealed SR w/ rate of 66, LAFB and prolonged QT interval. Ct head revealed no acute abnormality, CXR findings on admission c/w small (R) pleural effusion, no pulmonary edema w/ trace basilar atelectasis vs infiltrate. First troponin negative, BNP 9728. Pt has no leukocytosis or fever, [place] 1.8 (slightly up from baseline of 1.0) and lactic acid was 1.8. At bedside pt noted very lethargic. Will grimace and attempt to pull away from sternal rub but will not open eyes. There is no obvious facial droop. PEARRL. Skin cool and dry. BBS w/ crackles at bilateral bases. VS BP-117/76, HR-50-60-'s, R-14 w/ 02 sats of 92% on 2L [place]. Pt placed on NRB and prepared for transfer. Assessment/Plan: 1. Acute hypercarbic respiratory failure: Felt related to CHF, PNA felt less likely, (R)  pleural effusion. Pt placed on NRB and transferred to ICU. Once in ICU pt was placed on BiPAP and at least initially appears to be tolerating. Discussed pt w/ Dr Joya Gaskins w/ [facility] who has agreed to have pt evaluated by [facility] provider as pt is high risk for intubation. Will repeat ABG in and hour.  I spoke at length with pt's family regarding her change in status and current plan. At this time the family wishes for pt to be intubated if needed. They were very appreciative of my efforts to explain pt's status and understand that CCM will take over pt's care for now while in ICU though our service will follow.  Appreciate CCM input. Will follow.  Jeryl Columbia, NP-C [facility] Pager [phone]

## 2013-10-12 NOTE — ED Notes (Signed)
Pt. Was on 1L O2 when this nurse walked into patient room, O2 sat 89%. Placed patient on 3L O2 and pt O2 sat rised to 96%. Pt. States "I'm not having a hard time breathing, but it doesn't feel normal".

## 2013-10-12 NOTE — Progress Notes (Signed)
Pt to be transferred to another floor. Family [name] (daughter)  Kamela Blansett (husband) at bedside updated to pt's condition and need for transfer to another floor. MD to come up to bedside

## 2013-10-12 NOTE — Progress Notes (Signed)
NURSING PROGRESS NOTE  NURY NEBERGALL [id number] Admission Data: 10/12/2013 7:39 PM Attending Provider: Charlynne Cousins, MD XBD:[id number] DAVID, MD Code Status: Full   Alyssa [name] is a 68 y.o. female patient admitted from ED:  -No acute distress noted.  -No complaints of shortness of breath.  -No complaints of chest pain.   Cardiac Monitoring:  Box # 17 in place.  Blood pressure 132/74, pulse 63, temperature 97.3 F (36.3 C), temperature source Oral, resp. rate 18, height 5\' 4"  (1.626 m), weight 101.606 kg (224 lb), SpO2 96.00%. on 4L [place]. Desats when talking and using accessory muscles when breathing. Patient lethargic with decreased LOC. Charge RN called in to assess patient.   IV in left hand no sighs of phlebitis or infection.  Allergies:  Ciprofloxacin hcl; Cymbalta; Codeine; Lortab; and Oxycodone  Past Medical History:   has a past medical history of Osteopenia; Osteoporosis; Diabetes mellitus without complication; Hypertension; Bipolar 1 disorder; Hyperlipidemia; Anemia; Cataract; Unspecified vitamin D deficiency; Glaucoma; Chronic back pain; and Hypothyroidism.  Past Surgical History:   has past surgical history that includes Back surgery (2007); Knee arthroscopy (Bilateral); Dilation and curettage of uterus; Breast surgery (Left); and Lumbar fusion (2009).  Skin: Pt has reddened areas in skin folds. Buttocks reddened and blanchable. All other skin areas intact.  Patient and family orientated to room. Information packet given to patient. Admission inpatient armband information verified with patient to include name and date of birth and placed on patient arm. Side rails up x 2, fall assessment and education completed with patient. Call light within reach.  Will continue to evaluate and treat per MD orders.  Wallie Renshaw, RN

## 2013-10-12 NOTE — H&P (Signed)
[facility] History and Physical  Alyssa [name] [id number] DOB: [birth year] DOA: 10/12/2013  Referring physician: Dr. Cherlynn June PCP: Alesia Richards, MD   Chief Complaint: SOB  HPI: Alyssa [name] is a 68 y.o. female  Past medical history of hypertension, diabetes (having trouble taking her medication at home), and bipolar disorder, that went to see her doctor 3 days prior to admission she was given oral Lasix and told to come back on Monday. Today she was seen in the office and was sent directly to the ED. As per daughter as per patient is confused and cannot give a good history she has been more short of breath to the point she can even walk to the bathroom without getting short of breath. Her legs have been swollen. She is unable a flat. She denies any chest pain. Denies productive cough. Denies fevers, sick contacts or travel history.  The ED: She has remained afebrile she has no leukocytosis on CBC, her basic metabolic panel shows a creatinine of 1.5 (baseline creatinine 1.0) pro BNP was 9700 her lactic acid was 1.8, she got Rocephin and azithromycin in the ED and we were consulted for further evaluation.  Review of Systems:  Constitutional:  No weight loss, night sweats, Fevers, chills, fatigue.  HEENT:  No headaches, Difficulty swallowing,Tooth/dental problems,Sore throat,  No sneezing, itching, ear ache, nasal congestion, post nasal drip,  Cardio-vascular:  No chest pain, Orthopnea, PND, swelling in lower extremities, anasarca, dizziness, palpitations  GI:  No heartburn, indigestion, abdominal pain, nausea, vomiting, diarrhea, change in bowel habits, loss of appetite  Resp:  No shortness of breath with exertion or at rest. No excess mucus, no productive cough, No non-productive cough, No coughing up of blood.No change in color of mucus.No wheezing.No chest wall deformity  Skin:  no rash or lesions.  GU:  no dysuria, change in color of urine, no urgency or  frequency. No flank pain.  Musculoskeletal:  No joint pain or swelling. No decreased range of motion. No back pain.  Psych:  No change in mood or affect. No depression or anxiety. No memory loss.   Past Medical History  Diagnosis Date  . Osteopenia   . Osteoporosis   . Diabetes mellitus without complication   . Hypertension   . Bipolar 1 disorder   . Hyperlipidemia   . Anemia   . Cataract   . Unspecified vitamin D deficiency   . Glaucoma   . Chronic back pain   . Hypothyroidism    Past Surgical History  Procedure Laterality Date  . Back surgery  2007  . Knee arthroscopy Bilateral   . Dilation and curettage of uterus    . Breast surgery Left     CYST REMOVAL  . Lumbar fusion  2009   Social History:  reports that she has never smoked. She has never used smokeless tobacco. She reports that she does not drink alcohol or use illicit drugs.  Allergies  Allergen Reactions  . Ciprofloxacin Hcl Diarrhea  . Cymbalta [Duloxetine Hcl] Other (See Comments)    Per pt: it did not work  . Codeine Other (See Comments)    Per pt: unknown  . Lortab [Hydrocodone-Acetaminophen] Other (See Comments)    Per pt: unknown  . Oxycodone Other (See Comments)    Per pt: unknown    Family History  Problem Relation Age of Onset  . Adopted: Yes     Prior to Admission medications   Medication Sig Start Date End Date Taking?  Authorizing Provider  aspirin 81 MG tablet Take 81 mg by mouth daily.   Yes Historical Provider, MD  atenolol (TENORMIN) 50 MG tablet Take 1 tablet (50 mg total) by mouth 2 (two) times daily. 09/17/13  Yes Melissa R Smith, PA-C  azithromycin (ZITHROMAX) 250 MG tablet Take 250 mg by mouth daily. Started 10/12/13, for 4 days,ending 10/15/13   Yes Historical Provider, MD  brimonidine (ALPHAGAN P) 0.1 % SOLN Place 1 drop into both eyes 2 (two) times daily.    Yes Historical Provider, MD  Cholecalciferol (VITAMIN D-3 PO) Take 5,000 Units by mouth every evening.    Yes Historical  Provider, MD  dorzolamide (TRUSOPT) 2 % ophthalmic solution Place 1 drop into both eyes 2 (two) times daily.   Yes Historical Provider, MD  furosemide (LASIX) 40 MG tablet Take 40 mg by mouth daily.   Yes Historical Provider, MD  gabapentin (NEURONTIN) 800 MG tablet Take 800mg  by mouth twice daily 06/28/13  Yes Melissa R Smith, PA-C  glipiZIDE (GLUCOTROL) 10 MG tablet Take 5-10 mg by mouth 2 (two) times daily as needed (only takes if needed, depending on glucose level >200).    Yes Historical Provider, MD  latanoprost (XALATAN) 0.005 % ophthalmic solution Place 1 drop into the right eye at bedtime.   Yes Historical Provider, MD  levothyroxine (SYNTHROID) 50 MCG tablet Take 1 tablet (50 mcg total) by mouth daily before breakfast. 07/15/13  Yes Unk Pinto, MD  losartan (COZAAR) 100 MG tablet Take 100 mg by mouth daily.   Yes Historical Provider, MD  magnesium oxide (MAG-OX) 400 MG tablet Take 1 tablet (400 mg total) by mouth 2 (two) times daily. 09/17/13  Yes Melissa R Smith, PA-C  metFORMIN (GLUCOPHAGE-XR) 500 MG 24 hr tablet Take 1,000 mg by mouth 2 (two) times daily.   Yes Historical Provider, MD  minoxidil (LONITEN) 10 MG tablet Take 10 mg by mouth daily. 09/17/13  Yes Melissa R Smith, PA-C  Multiple Vitamin (MULTIVITAMIN) tablet Take 1 tablet by mouth daily.   Yes Historical Provider, MD  Omega-3 Fatty Acids (FISH OIL) 1000 MG CAPS Take 1,000 mg by mouth every evening.    Yes Historical Provider, MD  Probiotic Product (PROBIOTIC DAILY PO) Take 1 tablet by mouth daily.    Yes Historical Provider, MD  rosuvastatin (CRESTOR) 5 MG tablet Take 5 mg by mouth daily.   Yes Historical Provider, MD  traZODone (DESYREL) 150 MG tablet Take 150 mg by mouth at bedtime.    Yes Historical Provider, MD  vitamin B-12 (CYANOCOBALAMIN) 1000 MCG tablet Take 1,000 mcg by mouth daily.   Yes Historical Provider, MD  Vortioxetine HBr (BRINTELLIX) 10 MG TABS Take 10 mg by mouth daily.   Yes Historical Provider, MD    Physical Exam: Filed Vitals:   10/12/13 1636  BP: 158/76  Pulse: 66  Temp:   Resp: 16    BP 158/76  Pulse 66  Temp(Src) 98.4 F (36.9 C) (Oral)  Resp 16  Ht 5\' 4"  (1.626 m)  Wt 101.606 kg (224 lb)  BMI 38.43 kg/m2  SpO2 91%  General:  Appears calm and comfortable Eyes: PERRL, normal lids, irises & conjunctiva ENT: grossly normal hearing, lips & tongue Neck: no LAD, JVD [place] centimeters of water, and positive hepatojugular reflux Cardiovascular: RRR, no m/r/g. 3+ LE edema. Telemetry: SR, no arrhythmias  Respiratory: CTA bilaterally, no w/r/r. Normal respiratory effort. Abdomen: soft, ntnd Skin: no rash or induration seen on limited exam Psychiatric: grossly normal mood  and affect, speech fluent and appropriate Neurologic: grossly non-focal.          Labs on Admission:  Basic Metabolic Panel:  Recent Labs Lab 10/09/13 1117 10/12/13 1527  NA 142 142  K 4.4 4.4  CL 100 100  CO2 32 32  GLUCOSE 145* 247*  BUN 19 32*  CREATININE 1.28* 1.54*  CALCIUM 10.0 10.2   Liver Function Tests:  Recent Labs Lab 10/09/13 1117 10/12/13 1527  AST 49* 53*  ALT 42* 53*  ALKPHOS 65 78  BILITOT 0.5 0.3  PROT 6.3 6.6  ALBUMIN 3.9 3.5   No results found for this basename: LIPASE, AMYLASE,  in the last 168 hours No results found for this basename: AMMONIA,  in the last 168 hours CBC:  Recent Labs Lab 10/09/13 1117 10/12/13 1527  WBC 7.1 5.9  NEUTROABS 5.0 4.1  HGB 10.7* 10.6*  HCT 32.8* 33.6*  MCV 86.5 91.1  PLT 311 297   Cardiac Enzymes: No results found for this basename: CKTOTAL, CKMB, CKMBINDEX, TROPONINI,  in the last 168 hours  BNP (last 3 results)  Recent Labs  10/12/13 1528  PROBNP 9728.0*   CBG:  Recent Labs Lab 10/07/13 0921 10/07/13 1027 10/12/13 1534  GLUCAP 81 140* 235*    Radiological Exams on Admission: Dg Chest 2 View  10/12/2013   CLINICAL DATA:  Shortness of Breath  EXAM: CHEST  2 VIEW  COMPARISON:  10/09/2013  FINDINGS:  Study is limited by poor inspiration. Cardiomediastinal silhouette is stable. There is small right pleural effusion with right basilar atelectasis or infiltrate. Trace left basilar atelectasis or infiltrate. Central vascular congestion without convincing pulmonary edema.  IMPRESSION: Small right pleural effusion with right basilar atelectasis or infiltrate. No pulmonary edema. Trace left basilar atelectasis or infiltrate. Limited study by poor inspiration.   Electronically Signed   By: Lahoma Crocker M.D.   On: 10/12/2013 16:43   Ct Head Wo Contrast  10/12/2013   CLINICAL DATA:  Altered mental status, lethargy, confusion  EXAM: CT HEAD WITHOUT CONTRAST  TECHNIQUE: Contiguous axial images were obtained from the base of the skull through the vertex without intravenous contrast.  COMPARISON:  None.  FINDINGS: No skull fracture is noted. Paranasal sinuses and mastoid air cells are unremarkable.  Moderate cerebral atrophy. Periventricular and patchy subcortical white matter decreased attenuation probable due to chronic small vessel ischemic changes. No intracranial hemorrhage, mass effect or midline shift. No acute cortical infarction. No mass lesion is noted on this unenhanced scan.  IMPRESSION: No acute intracranial abnormality. Moderate cerebral atrophy. Periventricular and patchy subcortical white matter decreased attenuation probable due to chronic small vessel ischemic changes.   Electronically Signed   By: Lahoma Crocker M.D.   On: 10/12/2013 17:04    EKG: Independently reviewed. Sinus rhythm prolonged QT T wave inversions in V1 through V3  Assessment/Plan Acute respiratory failure with hypoxia due to   Heart failure of unknown type: - She desats while she is talking to you. She has lower extremity edema positive JVD crackles on the right side of her lungs and a chest x-ray that showed right pleural effusion with an elevated BNP. I am concerned for acute congestive heart failure. She has remained afebrile no  white count cough, fever or chills. Which makes pneumonia unlikely. Elbow and stop the antibiotics. - Start her on IV Lasix drip, place and IV therapy monitor strict I.'s and O.'s Place n.p.o. until she is more awake. - Patient and family denies any chest  pain. I will go ahead and get a 2-D echo we'll cycle her cardiac enzymes. Check a TSH and a B12. - DC atenolol as it did not offers any mortality benefit started tomorrow morning on low-dose Coreg.  Diabetes mellitus without complication - Patient is currently in acute kidney injury, we'll go ahead and hold the metformin and glipizide. We'll start her on sliding scale insulin. - Uses a sliding scale and she is in acute renal failure.  Acute kidney injury: - This is likely secondary to acute decompensated heart failure. We'll start her on it the Lasix place a Foley monitor strict I.'s and O.'s.  Hypertension: - Blood pressure is high it is actually due to the volume overload. We'll start an ACE inhibitor.   Hyperlipidemia: -Continue statins.  Obesity, morbid: - Counseling.    Code Status: full Family Communication: daughter Disposition Plan: inpatient  Time spent: 30 minutes  Charlynne Cousins [facility] Pager [phone]

## 2013-10-12 NOTE — ED Notes (Signed)
Report to [place] resting with family at bedside.

## 2013-10-12 NOTE — ED Notes (Signed)
Lactic Acid I Stat results shown to Dr. Lenna Sciara. Alyssa [name]

## 2013-10-12 NOTE — Progress Notes (Signed)
[facility] Progress Note Patient Name: Alyssa [name] DOB: [birth year] MRN: [id number]  Date of Service  10/12/2013   HPI/Events of Note  Call from nurse reporting marked SB with HR in the 30s following intubation and administration of 100 mcg fentanyl and 2 versed.  HD stable.  EKG shows marked SB.  Suspect SB is related to fentanyl bolus.   eICU Interventions  Plan: Hold on additional fentanyl Use propofol for sedation but monitor closely for SB PRN versed  Trop are currently being trended   Intervention Category Intermediate Interventions: Arrhythmia - evaluation and management  DETERDING,ELIZABETH 10/12/2013, 11:50 PM

## 2013-10-12 NOTE — Progress Notes (Addendum)
At bedside, pt disoriented, lethargic, crackles to lungs, &  not looking good. Pt only responding to painful stimuli.  Paged rapid response [place] to come to bedside. ABGs being collected by respiratory therapist Janett Billow & being tubed down STAT. Explained to pt's daughter and husband at the bedside what's going on with the patient and the plan to be transferred to another floor. Paged K. Schorr back who stated she will come to bedside once results from ABGS came in. Vital signs: temp of 97.5 axillary, r-18, o2-92 on 4L [place], bp-123/42. MD called back and to come to bedside.

## 2013-10-12 NOTE — Progress Notes (Signed)
RN and Agricultural consultant concerned that patient may need higher level of care for more closely monitoring. Patient is arousauble, but very lethargic with decreased LOC. Destats when talking. Patient is currently on 4L [place] and MD note concern for acute respiratory failure with hypoxia related to acute CHF and acute kidney injury. MD paged. Expressed concerns and orders received. MD to see patient.

## 2013-10-12 NOTE — Progress Notes (Signed)
Subjective:    Patient ID: Alyssa [name], female    DOB: May 06, 1946, 68 y.o.   MRN: [id number]  HPI Daughter is with the patient. This past Friday she had SOB, CXR shows increased fluid- she was on lasix 40mg  BID and zpak for possible COPD. She has only lost 2 lbs and her daughter states she is hallucinating, SOB, sleeping. Her daughter could not convince her to go to the Er. Her O2 is found to be 81% here in the office, decreased breath sounds, and lethargic/confusion. ER called.   Wt Readings from Last 3 Encounters:  10/12/13 234 lb (106.142 kg)  10/09/13 236 lb (107.049 kg)  10/07/13 224 lb (101.606 kg)   Current Outpatient Prescriptions on File Prior to Visit  Medication Sig Dispense Refill  . aspirin 81 MG tablet Take 81 mg by mouth daily.      Marland Kitchen atenolol (TENORMIN) 50 MG tablet Take 1 tablet (50 mg total) by mouth 2 (two) times daily.  180 tablet  1  . azithromycin (ZITHROMAX) 250 MG tablet 2 tablets by mouth today then one tablet daily for 4 days.  6 tablet  1  . brimonidine (ALPHAGAN P) 0.1 % SOLN 1 drop 2 (two) times daily.      . Cholecalciferol (VITAMIN D-3 PO) Take 5,000 Units by mouth. Takes in PM      . dorzolamide (TRUSOPT) 2 % ophthalmic solution Place 1 drop into both eyes 2 (two) times daily.      . furosemide (LASIX) 40 MG tablet TAKE 1 TABLET BY MOUTH DAILY  90 tablet  1  . gabapentin (NEURONTIN) 800 MG tablet take 1 tablet twice a day      . glipiZIDE (GLUCOTROL) 10 MG tablet Take 1/2 to 1 tab bid if glucose greater than 200 mg %  180 tablet  1  . latanoprost (XALATAN) 0.005 % ophthalmic solution Place 1 drop into the right eye at bedtime.      Marland Kitchen levothyroxine (SYNTHROID) 50 MCG tablet Take 1 tablet (50 mcg total) by mouth daily before breakfast.  90 tablet  99  . losartan (COZAAR) 100 MG tablet TAKE ONE TABLET BY MOUTH DAILY  90 tablet  1  . magnesium oxide (MAG-OX) 400 MG tablet Take 1 tablet (400 mg total) by mouth 2 (two) times daily.  180 tablet  1  . metFORMIN  (GLUCOPHAGE-XR) 500 MG 24 hr tablet TAKE 2 TABLETS BY MOUTH TWICE DAILY FOR DIABETES  360 tablet  0  . minoxidil (LONITEN) 10 MG tablet Take 10 mg by mouth daily.      . Multiple Vitamin (MULTIVITAMIN) tablet Take 1 tablet by mouth daily.      . Omega-3 Fatty Acids (FISH OIL) 1000 MG CAPS Take by mouth. Takes in pm      . Probiotic Product (PROBIOTIC DAILY PO) Take by mouth daily.      . rosuvastatin (CRESTOR) 5 MG tablet Take 5 mg by mouth daily.      . traZODone (DESYREL) 150 MG tablet Take by mouth at bedtime.      . vitamin B-12 (CYANOCOBALAMIN) 1000 MCG tablet Take 1,000 mcg by mouth daily.      . Vortioxetine HBr (BRINTELLIX) 10 MG TABS Take 10 mg by mouth daily.       No current facility-administered medications on file prior to visit.   Past Medical History  Diagnosis Date  . Osteopenia   . Osteoporosis   . Diabetes mellitus without complication   .  Hypertension   . Bipolar 1 disorder   . Hyperlipidemia   . Anemia   . Cataract   . Unspecified vitamin D deficiency   . Glaucoma   . Chronic back pain   . Hypothyroidism      Review of Systems See above    Objective:   Physical Exam Blood pressure 132/70, pulse 64, temperature 97.9 F (36.6 C), resp. rate 16, weight 234 lb (106.142 kg). General appearance: lethargic, and morbidly obese Lungs: diminished breath sounds bibasilar and rales base - right Heart: regular rate and rhythm, S1, S2 normal, S3 present and systolic murmur: late systolic 2/6, decrescendo at 2nd left intercostal space Abdomen: normal findings: bowel sounds normal, no masses palpable and soft, non-tender and abnormal findings:  distended and obese Extremities: edema 2+, varicose veins noted and venous stasis dermatitis noted Neurologic: A&O x 2     Assessment & Plan:  AMS/ O2 81%/ SOB- ambulance called and patient sent to ER- ? CHF,COPD, infection

## 2013-10-12 NOTE — Progress Notes (Signed)
CRITICAL VALUE ALERT  Critical value received:  co2    71  Date of notification:  10/12/13  Time of notification:  2215  Critical value read back: yes  Nurse who received alert:  km  Responding MD:  Joya Gaskins  Time MD responded:  2215

## 2013-10-12 NOTE — Progress Notes (Signed)
Called by Charge RN at 2053 that NP requesting ICU bed instead of SDU. Patient's clinical assessment is the same, advised RN to notify patient placement for higher level of care.

## 2013-10-12 NOTE — Progress Notes (Signed)
CRITICAL VALUE ALERT  Critical value received:  PH-7.22, O2-90, BICARB-31, & O2-81  Date of notification: 10/12/13  Time of notification:  2023  Critical value read back:yes  Nurse who received alert:  Larose Kells, RN  MD notified (1st page):  Raliegh Ip Schorr  Time of first page: 2023  MD notified (2nd page):  Time of second page:  Responding MD:  Lamar Blinks  Time MD responded:  2032

## 2013-10-13 ENCOUNTER — Encounter: Payer: Self-pay | Admitting: *Deleted

## 2013-10-13 DIAGNOSIS — J811 Chronic pulmonary edema: Secondary | ICD-10-CM

## 2013-10-13 DIAGNOSIS — I5031 Acute diastolic (congestive) heart failure: Secondary | ICD-10-CM | POA: Diagnosis present

## 2013-10-13 DIAGNOSIS — I369 Nonrheumatic tricuspid valve disorder, unspecified: Secondary | ICD-10-CM

## 2013-10-13 LAB — POCT I-STAT 3, ART BLOOD GAS (G3+)
Acid-Base Excess: 7 mmol/L — ABNORMAL HIGH (ref 0.0–2.0)
Acid-Base Excess: 8 mmol/L — ABNORMAL HIGH (ref 0.0–2.0)
Bicarbonate: 31.1 mEq/L — ABNORMAL HIGH (ref 20.0–24.0)
Bicarbonate: 33.6 mEq/L — ABNORMAL HIGH (ref 20.0–24.0)
O2 Saturation: 93 %
O2 Saturation: 96 %
PCO2 ART: 53.6 mmHg — AB (ref 35.0–45.0)
PH ART: 7.405 (ref 7.350–7.450)
PH ART: 7.509 — AB (ref 7.350–7.450)
PO2 ART: 71 mmHg — AB (ref 80.0–100.0)
Patient temperature: 98.5
TCO2: 32 mmol/L (ref 0–100)
TCO2: 35 mmol/L (ref 0–100)
pCO2 arterial: 39 mmHg (ref 35.0–45.0)
pO2, Arterial: 70 mmHg — ABNORMAL LOW (ref 80.0–100.0)

## 2013-10-13 LAB — TRIGLYCERIDES: TRIGLYCERIDES: 68 mg/dL (ref <150)

## 2013-10-13 LAB — TSH
TSH: 2.693 u[IU]/mL (ref 0.350–4.500)
TSH: 2.481 u[IU]/mL (ref 0.350–4.500)

## 2013-10-13 LAB — BASIC METABOLIC PANEL
BUN: 35 mg/dL — ABNORMAL HIGH (ref 6–23)
CO2: 33 mEq/L — ABNORMAL HIGH (ref 19–32)
Calcium: 9.7 mg/dL (ref 8.4–10.5)
Chloride: 103 mEq/L (ref 96–112)
Creatinine, Ser: 1.79 mg/dL — ABNORMAL HIGH (ref 0.50–1.10)
GFR calc Af Amer: 32 mL/min — ABNORMAL LOW (ref >90)
GFR calc non Af Amer: 28 mL/min — ABNORMAL LOW (ref >90)
Glucose, Bld: 121 mg/dL — ABNORMAL HIGH (ref 70–99)
Potassium: 3.9 mEq/L (ref 3.7–5.3)
Sodium: 145 mEq/L (ref 137–147)

## 2013-10-13 LAB — MRSA PCR SCREENING: MRSA by PCR: NEGATIVE

## 2013-10-13 LAB — GLUCOSE, CAPILLARY
GLUCOSE-CAPILLARY: 115 mg/dL — AB (ref 70–99)
GLUCOSE-CAPILLARY: 160 mg/dL — AB (ref 70–99)
GLUCOSE-CAPILLARY: 186 mg/dL — AB (ref 70–99)
Glucose-Capillary: 139 mg/dL — ABNORMAL HIGH (ref 70–99)
Glucose-Capillary: 98 mg/dL (ref 70–99)
Glucose-Capillary: 118 mg/dL — ABNORMAL HIGH (ref 70–99)

## 2013-10-13 LAB — TROPONIN I
Troponin I: <0.3 ng/mL (ref <0.30)
Troponin I: <0.3 ng/mL (ref <0.30)

## 2013-10-13 LAB — URINE CULTURE
Colony Count: NO GROWTH
Culture: NO GROWTH

## 2013-10-13 LAB — T4, FREE: FREE T4: 1.15 ng/dL (ref 0.80–1.80)

## 2013-10-13 LAB — HEMOGLOBIN A1C
HEMOGLOBIN A1C: 7.4 % — AB (ref <5.7)
Mean Plasma Glucose: 166 mg/dL — ABNORMAL HIGH (ref <117)

## 2013-10-13 MED ORDER — CHLORHEXIDINE GLUCONATE 0.12 % MT SOLN
15.0000 mL | Freq: Two times a day (BID) | OROMUCOSAL | Status: DC
  Administered 2013-10-13 – 2013-10-16 (×6): 15 mL via OROMUCOSAL
  Filled 2013-10-13 (×9): qty 15

## 2013-10-13 MED ORDER — INSULIN ASPART 100 UNIT/ML ~~LOC~~ SOLN: 0.0000 [IU] | Freq: Every day | SUBCUTANEOUS | Status: DC

## 2013-10-13 MED ORDER — ENOXAPARIN SODIUM 30 MG/0.3ML ~~LOC~~ SOLN
30.0000 mg | SUBCUTANEOUS | Status: DC
  Administered 2013-10-13 – 2013-10-15 (×3): 30 mg via SUBCUTANEOUS
  Filled 2013-10-13 (×4): qty 0.3

## 2013-10-13 MED ORDER — GLIPIZIDE 5 MG PO TABS
5.0000 mg | ORAL_TABLET | Freq: Two times a day (BID) | ORAL | Status: DC
  Administered 2013-10-14 – 2013-10-16 (×4): 5 mg via ORAL
  Filled 2013-10-13 (×7): qty 1

## 2013-10-13 MED ORDER — BIOTENE DRY MOUTH MT LIQD
15.0000 mL | Freq: Two times a day (BID) | OROMUCOSAL | Status: DC
  Administered 2013-10-13 – 2013-10-15 (×6): 15 mL via OROMUCOSAL

## 2013-10-13 MED ORDER — FENTANYL CITRATE 0.05 MG/ML IJ SOLN: 12.5000 ug | INTRAMUSCULAR | Status: DC | PRN

## 2013-10-13 MED ORDER — MIDAZOLAM HCL 2 MG/2ML IJ SOLN
1.0000 mg | INTRAMUSCULAR | Status: DC | PRN
  Administered 2013-10-13 (×5): 1 mg via INTRAVENOUS
  Filled 2013-10-13 (×4): qty 2

## 2013-10-13 MED ORDER — PROPOFOL 10 MG/ML IV EMUL
0.0000 ug/kg/min | INTRAVENOUS | Status: DC
  Administered 2013-10-13: 10 ug/kg/min via INTRAVENOUS

## 2013-10-13 MED ORDER — INSULIN ASPART 100 UNIT/ML ~~LOC~~ SOLN
0.0000 [IU] | Freq: Three times a day (TID) | SUBCUTANEOUS | Status: DC
  Administered 2013-10-14 (×2): 3 [IU] via SUBCUTANEOUS
  Administered 2013-10-15 – 2013-10-16 (×4): 2 [IU] via SUBCUTANEOUS

## 2013-10-13 NOTE — Progress Notes (Signed)
Unable to get pt's  Tele strip for shift there was a glitch in the system

## 2013-10-13 NOTE — Progress Notes (Signed)
UR Completed.  [name] Jane [phone] 10/13/2013  

## 2013-10-13 NOTE — Progress Notes (Signed)
Patient requesting a cardiology  consult.

## 2013-10-13 NOTE — ED Provider Notes (Signed)
I performed a history and physical examination of  Shelton Silvas and discussed her management with the Resident Physician. I agree with the history, physical, assessment, and plan of care, with the following exceptions: None I was present for the following procedures: None  Time Spent in Critical Care of the patient: None  Time spent in discussions with the patient and family: 10 minutes  Ivory Maduro  Pt comes in with cc of shortness of breath. Pt has hx of HTN, DM. Pt is noted to be hypoxic. Pt denies any chest pain - + cough. Denies any primary lung dz. Pt has been hallucinating as of late as well, denies psych hx.  CXR shows multifocal PNA. Lung exam shows rhonchi - diffuse.  Suspect AMS is due to her hypoxia, and is organic in nature and not psych related. She is aox3, and her neuro exam, outside of cerebellar exam is normal. Labs are otherwise unremarkable.  Pt to be admitted for CAP and Hypoxic respiratory failure, Severe sepsis.     Varney Biles, MD 10/14/13 0002

## 2013-10-13 NOTE — Progress Notes (Signed)
Pt passed SBT and is tolerating well at this time. Pt is sitting up in the chair and is much more alert and is following commands. No complications noted. RT will monitor.

## 2013-10-13 NOTE — Procedures (Signed)
Extubation Procedure Note  Patient Details:   Name: Alyssa [name] DOB: [birth year] MRN: [id number]   Airway Documentation:     Evaluation  O2 sats: stable throughout Complications: No apparent complications Patient did tolerate procedure well. Bilateral Breath Sounds: Rhonchi;Diminished Suctioning: Airway Yes Pt extubated per MD order to 4L [place]. No stridor or other complications noted. RT will monitor.   Tamala Julian Arlene 10/13/2013, 11:57 AM

## 2013-10-13 NOTE — Progress Notes (Signed)
PULMONARY / CRITICAL CARE MEDICINE   Name: Alyssa [name] MRN: [id number] DOB: 11-Jan-1946    ADMISSION DATE:  10/12/2013 CONSULTATION DATE:  10/12/13  REFERRING MD :  [facility] / Dr. Aileen Fass PRIMARY SERVICE: [facility] -->PCCM  CHIEF COMPLAINT:  AMS  BRIEF PATIENT DESCRIPTION:  68 y/o F admitted on 3/23 per [facility] with AMS & acute hypoxic respiratory failure thought related to CHF.  Patient progressively became more lethargic with pCO2 >80 & tx to ICU requiring intubation.    SIGNIFICANT EVENTS / STUDIES:  3/23 admit with AMS & acute hypercarbic respiratory failure thought related to CHF. Requiring intubation after failed NPPV trial. Furosemide infusion initiated.  3/23 CT head: NAICP 3/24 Passed SBT. Comfortable. Extubated successfully 3/24 Echo:    LINES / TUBES: ETT 3/23 >> 3/24  CULTURES: Urine 3/23 >>   ANTIBIOTICS: Rocephin 3/23 x1 Azithro 3/23 x1    SUBJECTIVE:  Passed SBT comfortably. Extubated and looks good on post extubation check  VITAL SIGNS: Temp:  [97.3 F (36.3 C)-99.1 F (37.3 C)] 98.9 F (37.2 C) (03/24 1627) Pulse Rate:  [42-67] 52 (03/24 1500) Resp:  [12-22] 18 (03/24 1500) BP: (66-170)/(34-80) 111/52 mmHg (03/24 1500) SpO2:  [91 %-100 %] 96 % (03/24 1500) FiO2 (%):  [40 %-50 %] 40 % (03/24 1100) Weight:  [107.8 kg (237 lb 10.5 oz)] 107.8 kg (237 lb 10.5 oz) (03/24 0500)  HEMODYNAMICS:    VENTILATOR SETTINGS: Vent Mode:  [-] PSV;CPAP FiO2 (%):  [40 %-50 %] 40 % Set Rate:  [14 bmp-18 bmp] 14 bmp Vt Set:  [450 mL-500 mL] 450 mL PEEP:  [5 cmH20] 5 cmH20 Pressure Support:  [5 cmH20] 5 cmH20 Plateau Pressure:  [20 cmH20-37 cmH20] 21 cmH20  INTAKE / OUTPUT: Intake/Output     03/23 0701 - 03/24 0700 03/24 0701 - 03/25 0700   I.V. (mL/kg) 246.3 (2.3) 80 (0.7)   Total Intake(mL/kg) 246.3 (2.3) 80 (0.7)   Urine (mL/kg/hr) 350 500 (0.5)   Total Output 350 500   Net -103.7 -420          PHYSICAL EXAMINATION: General: obese female, NAD Neuro: RASS 0.  + F/C. No focal defcits HEENT: WNL Cardiovascular: bradycardic, regular, no M noted Lungs: Dependent crackles, no wheezes Abdomen:  Obese, soft, bsx4 active Ext: 2+ symmetric pitting edema  LABS: I have reviewed all of today's lab results. Relevant abnormalities are discussed in the A/P section  EKG: sinus brady. Ant-lat T wave inversion  CXR: NNF  ASSESSMENT / PLAN:  PULMONARY A: Acute Hypercarbic Respiratory Failure Pulmonary edema B pleural effusions P:   Monitor in ICU post extubation Supplemental O2 carefully titrated Cont furosemide gtt  CARDIOVASCULAR A:  Decompensated CHF  Elevated pro BNP Bradycardia Abnormal EKG worrisome for ischemia  Cardiac biomarkers negative HTN HLD P:  Monitor rhythm and BP F/U Echocardiogram Consider Cards eval 3/25  RENAL A:   Acute Kidney Injury (Baseline Cr approx 1) P:   Monitor BMET intermittently Monitor I/Os Correct electrolytes as indicated  GASTROINTESTINAL A:   Morbid Obesity P:   SUP: IV pantoprazole NPO post extubation  HEMATOLOGIC A:   Anemia - no evidence of acute bleeding P:  DVT px: SQ heparin Monitor CBC intermittently Transfuse per usual ICU guidelines  INFECTIOUS A:   No evidence of acute bacterial infection P:   Micro and abx as above  ENDOCRINE A:   DM   Hypothyroidism, TSH normal on repletion therapy P:   Cont SSI continue L thyroxine  NEUROLOGIC  A:   Acute hypercarbic encephalopathy, resolved Chronic Pain syndrome Bipolar Disorder P:   minimize sedation as able    GLOBAL: Family updated at bedside.    CCM X 35 mins  Merton Border, MD ; [facility] [phone].  After 5:30 PM or weekends, call [phone]

## 2013-10-13 NOTE — Progress Notes (Signed)
Pt failed SBT/wean attempt due to desaturations and low VT, Ve and pt just not quite awake enough yet. RT will retry SBT later this AM. RT will monitor.

## 2013-10-13 NOTE — Progress Notes (Signed)
  Echocardiogram 2D Echocardiogram has been performed.  Alyssa [name] 10/13/2013, 4:31 PM

## 2013-10-14 ENCOUNTER — Inpatient Hospital Stay (HOSPITAL_COMMUNITY): Payer: Medicare Other

## 2013-10-14 DIAGNOSIS — I509 Heart failure, unspecified: Secondary | ICD-10-CM

## 2013-10-14 DIAGNOSIS — I5031 Acute diastolic (congestive) heart failure: Secondary | ICD-10-CM

## 2013-10-14 DIAGNOSIS — J9600 Acute respiratory failure, unspecified whether with hypoxia or hypercapnia: Secondary | ICD-10-CM

## 2013-10-14 DIAGNOSIS — J90 Pleural effusion, not elsewhere classified: Secondary | ICD-10-CM

## 2013-10-14 DIAGNOSIS — R9431 Abnormal electrocardiogram [ECG] [EKG]: Secondary | ICD-10-CM

## 2013-10-14 LAB — GLUCOSE, CAPILLARY
GLUCOSE-CAPILLARY: 208 mg/dL — AB (ref 70–99)
Glucose-Capillary: 186 mg/dL — ABNORMAL HIGH (ref 70–99)
Glucose-Capillary: 196 mg/dL — ABNORMAL HIGH (ref 70–99)
Glucose-Capillary: 177 mg/dL — ABNORMAL HIGH (ref 70–99)
Glucose-Capillary: 90 mg/dL (ref 70–99)
Glucose-Capillary: 111 mg/dL — ABNORMAL HIGH (ref 70–99)

## 2013-10-14 LAB — BASIC METABOLIC PANEL
BUN: 32 mg/dL — ABNORMAL HIGH (ref 6–23)
CO2: 33 mEq/L — ABNORMAL HIGH (ref 19–32)
Calcium: 9.8 mg/dL (ref 8.4–10.5)
Chloride: 99 mEq/L (ref 96–112)
Creatinine, Ser: 1.62 mg/dL — ABNORMAL HIGH (ref 0.50–1.10)
GFR calc Af Amer: 37 mL/min — ABNORMAL LOW (ref >90)
GFR calc non Af Amer: 32 mL/min — ABNORMAL LOW (ref >90)
Glucose, Bld: 217 mg/dL — ABNORMAL HIGH (ref 70–99)
Potassium: 3.8 mEq/L (ref 3.7–5.3)
Sodium: 143 mEq/L (ref 137–147)

## 2013-10-14 LAB — BODY FLUID CELL COUNT WITH DIFFERENTIAL
Eos, Fluid: 0 %
Lymphs, Fluid: 84 %
Monocyte-Macrophage-Serous Fluid: 9 % — ABNORMAL LOW (ref 50–90)
Neutrophil Count, Fluid: 7 % (ref 0–25)
Total Nucleated Cell Count, Fluid: 422 cu mm (ref 0–1000)

## 2013-10-14 LAB — PROTEIN, BODY FLUID: TOTAL PROTEIN, FLUID: 1.8 g/dL

## 2013-10-14 LAB — LACTATE DEHYDROGENASE, PLEURAL OR PERITONEAL FLUID: LD, Fluid: 43 U/L — ABNORMAL HIGH (ref 3–23)

## 2013-10-14 MED ORDER — PANTOPRAZOLE SODIUM 40 MG PO TBEC: 40.0000 mg | DELAYED_RELEASE_TABLET | Freq: Every day | ORAL | Status: DC

## 2013-10-14 MED ORDER — ACETAMINOPHEN 325 MG PO TABS
650.0000 mg | ORAL_TABLET | Freq: Four times a day (QID) | ORAL | Status: DC | PRN
  Administered 2013-10-14: 650 mg via ORAL
  Filled 2013-10-14: qty 2

## 2013-10-14 MED ORDER — FUROSEMIDE 40 MG PO TABS
40.0000 mg | ORAL_TABLET | Freq: Two times a day (BID) | ORAL | Status: DC
  Administered 2013-10-14 – 2013-10-16 (×4): 40 mg via ORAL
  Filled 2013-10-14 (×6): qty 1

## 2013-10-14 MED ORDER — HYDRALAZINE HCL 20 MG/ML IJ SOLN
10.0000 mg | INTRAMUSCULAR | Status: DC | PRN
  Administered 2013-10-14 – 2013-10-15 (×2): 10 mg via INTRAVENOUS
  Filled 2013-10-14 (×2): qty 1

## 2013-10-14 NOTE — Progress Notes (Signed)
  Alyssa [name] [id number]  Transfer Data: 10/14/2013 6:44 PM  Attending Provider: Juanito Doom, MD  GBT:[id number] DAVID, MD  Code Status: Full  Alyssa [name] is a 68 y.o. female patient transferred from 69M  -No acute distress noted.  -No complaints of shortness of breath.  -No complaints of chest pain.  Cardiac Monitoring:  Box # 09 in place.  Cardiac monitor yields:normal sinus rhythm.  Blood pressure 131/58, pulse 54, temperature 98.1 F (36.7 C), temperature source Oral, resp. rate 15, height 5\' 4"  (1.626 m), weight 104 kg (229 lb 4.5 oz), SpO2 100.00%.  ?  IV Fluids: IV in place, occlusive dsg intact without redness, IV cath forearm left, condition patent and no redness  none.  Allergies: Ciprofloxacin hcl; Cymbalta; Codeine; Lortab; and Oxycodone  Past Medical History:  has a past medical history of Osteopenia; Osteoporosis; Diabetes mellitus without complication; Hypertension; Bipolar 1 disorder; Hyperlipidemia; Anemia; Cataract; Unspecified vitamin D deficiency; Glaucoma; Chronic back pain; and Hypothyroidism.  Past Surgical History:  has past surgical history that includes Back surgery (2007); Knee arthroscopy (Bilateral); Dilation and curettage of uterus; Breast surgery (Left); and Lumbar fusion (2009).  Social History:  reports that she has never smoked. She has never used smokeless tobacco. She reports that she does not drink alcohol or use illicit drugs.  Skin: intact  Patient/Family orientated to room. Information packet given to patient/family. Admission inpatient armband information verified with patient/family to include name and date of birth and placed on patient arm. Side rails up x 2, fall assessment and education completed with patient/family. Patient/family able to verbalize understanding of risk associated with falls and verbalized understanding to call for assistance before getting out of bed. Call light within reach. Patient/family able to voice and  demonstrate understanding of unit orientation instructions.  Will continue to evaluate and treat per MD orders.

## 2013-10-14 NOTE — Procedures (Signed)
Thoracentesis Procedure Note  Pre-operative Diagnosis: Right sided pleural effusion  Post-operative Diagnosis: same  Indications: Pleural effusion  Procedure Details  Consent: Informed consent was obtained. Risks of the procedure were discussed including: infection, bleeding, pain, pneumothorax.  Under sterile conditions the patient was positioned. Betadine solution and sterile drapes were utilized.  1% plain lidocaine was used to anesthetize the 6 rib space. Fluid was obtained without any difficulties and minimal blood loss.  A dressing was applied to the wound and wound care instructions were provided.   Findings 1100 ml of cloudy pleural fluid was obtained. A sample was sent to Pathology for cytogenetics, flow, and cell counts, as well as for infection analysis.  Complications:  None; patient tolerated the procedure well.          Condition: stable  Plan A follow up chest x-ray was ordered. Bed Rest for 0 hours. Tylenol 650 mg. for pain.  Attending Attestation: I was present and scrubbed for the entire procedure.  U/S used in procedure.  Rush Farmer, M.D. [facility] Pulmonary/Critical Care Medicine. Pager: [phone]. After hours pager: [phone].

## 2013-10-14 NOTE — Progress Notes (Signed)
Pt has preventative foam on buttocks. Site assessment shows some redness but blanchable. Also pt has bruising on  RAC and on right arm

## 2013-10-14 NOTE — Consult Note (Signed)
Reason for Consult: Abnormal EKG  Requesting Physician: CCM  HPI: This is a 68 y.o. female with a past medical history significant for morbid obesity, NIDDM, HTN,  And dyslipidemia, followed by Dr Melford Aase. She denies any prior history of CAD, MI, or CHF. She thinks she has had a previous stress test but I could find no records of that. She was admitted 10/12/13 with acute respiratory failure from acute diastolic CHF. Echo showed an EF of 60-65% with grade 1 diastolic dysfunction and PA pressure of 56 mmHg. She has diuresed but today went to XR and had a Rt pleural effusion tapped- 1100cc. We are asked to see secondary to an abnormal EKG showing new septal TWI. The pt denies chest pain or any history of an abnormal EKG. She is much improved from admission and is transferring to 5W.   PMHx:  Past Medical History  Diagnosis Date  . Osteopenia   . Osteoporosis   . Diabetes mellitus without complication   . Hypertension   . Bipolar 1 disorder   . Hyperlipidemia   . Anemia   . Cataract   . Unspecified vitamin D deficiency   . Glaucoma   . Chronic back pain   . Hypothyroidism    Past Surgical History  Procedure Laterality Date  . Back surgery  2007  . Knee arthroscopy Bilateral   . Dilation and curettage of uterus    . Breast surgery Left     CYST REMOVAL  . Lumbar fusion  2009    FAMHx: Adopted   SOCHx:  reports that she has never smoked. She has never used smokeless tobacco. She reports that she does not drink alcohol or use illicit drugs.  ALLERGIES: Allergies  Allergen Reactions  . Ciprofloxacin Hcl Diarrhea  . Cymbalta [Duloxetine Hcl] Other (See Comments)    Per pt: it did not work  . Codeine Other (See Comments)    Per pt: unknown  . Lortab [Hydrocodone-Acetaminophen] Other (See Comments)    Per pt: unknown  . Oxycodone Other (See Comments)    Per pt: unknown    ROS: A comprehensive review of systems was negative. She denies ever having been told she has sleep  apnea but her husband says she snores.   HOME MEDICATIONS: Prescriptions prior to admission  Medication Sig Dispense Refill  . aspirin 81 MG tablet Take 81 mg by mouth daily.      Marland Kitchen atenolol (TENORMIN) 50 MG tablet Take 1 tablet (50 mg total) by mouth 2 (two) times daily.  180 tablet  1  . azithromycin (ZITHROMAX) 250 MG tablet Take 250 mg by mouth daily. Started 10/12/13, for 4 days,ending 10/15/13      . brimonidine (ALPHAGAN P) 0.1 % SOLN Place 1 drop into both eyes 2 (two) times daily.       . Cholecalciferol (VITAMIN D-3 PO) Take 5,000 Units by mouth every evening.       . dorzolamide (TRUSOPT) 2 % ophthalmic solution Place 1 drop into both eyes 2 (two) times daily.      . furosemide (LASIX) 40 MG tablet Take 40 mg by mouth daily.      Marland Kitchen gabapentin (NEURONTIN) 800 MG tablet Take $RemoveBef'800mg'iVzhoIXYyk$  by mouth twice daily      . glipiZIDE (GLUCOTROL) 10 MG tablet Take 5-10 mg by mouth 2 (two) times daily as needed (only takes if needed, depending on glucose level >200).       Marland Kitchen latanoprost (XALATAN) 0.005 % ophthalmic solution Place 1  drop into the right eye at bedtime.      Marland Kitchen levothyroxine (SYNTHROID) 50 MCG tablet Take 1 tablet (50 mcg total) by mouth daily before breakfast.  90 tablet  99  . losartan (COZAAR) 100 MG tablet Take 100 mg by mouth daily.      . magnesium oxide (MAG-OX) 400 MG tablet Take 1 tablet (400 mg total) by mouth 2 (two) times daily.  180 tablet  1  . metFORMIN (GLUCOPHAGE-XR) 500 MG 24 hr tablet Take 1,000 mg by mouth 2 (two) times daily.      . minoxidil (LONITEN) 10 MG tablet Take 10 mg by mouth daily.      . Multiple Vitamin (MULTIVITAMIN) tablet Take 1 tablet by mouth daily.      . Omega-3 Fatty Acids (FISH OIL) 1000 MG CAPS Take 1,000 mg by mouth every evening.       . Probiotic Product (PROBIOTIC DAILY PO) Take 1 tablet by mouth daily.       . rosuvastatin (CRESTOR) 5 MG tablet Take 5 mg by mouth daily.      . traZODone (DESYREL) 150 MG tablet Take 150 mg by mouth at bedtime.        . vitamin B-12 (CYANOCOBALAMIN) 1000 MCG tablet Take 1,000 mcg by mouth daily.      . Vortioxetine HBr (BRINTELLIX) 10 MG TABS Take 10 mg by mouth daily.        HOSPITAL MEDICATIONS: I have reviewed the patient's current medications.  VITALS: Blood pressure 131/55, pulse 54, temperature 98.1 F (36.7 C), temperature source Oral, resp. rate 22, height 5\' 4"  (1.626 m), weight 229 lb 4.5 oz (104 kg), SpO2 97.00%.  PHYSICAL EXAM: General appearance: alert, cooperative, no distress and morbidly obese Neck: no carotid bruit and no JVD Lungs: decreased breath sounds Lt base and crackls Rt base Heart: regular rate and rhythm Abdomen: obese, non tender Extremities: extremities normal, atraumatic, no cyanosis or edema Pulses: 2+ and symmetric Skin: Skin color, texture, turgor normal. No rashes or lesions Neurologic: Grossly normal  LABS: Results for orders placed during the hospital encounter of 10/12/13 (from the past 48 hour(s))  URINALYSIS, ROUTINE W REFLEX MICROSCOPIC     Status: Abnormal   Collection Time    10/12/13  4:45 PM      Result Value Ref Range   Color, Urine YELLOW  YELLOW   APPearance CLEAR  CLEAR   Specific Gravity, Urine 1.021  1.005 - 1.030   pH 5.0  5.0 - 8.0   Glucose, UA NEGATIVE  NEGATIVE mg/dL   Hgb urine dipstick NEGATIVE  NEGATIVE   Bilirubin Urine NEGATIVE  NEGATIVE   Ketones, ur NEGATIVE  NEGATIVE mg/dL   Protein, ur 10/14/13 (*) NEGATIVE mg/dL   Urobilinogen, UA 0.2  0.0 - 1.0 mg/dL   Nitrite NEGATIVE  NEGATIVE   Leukocytes, UA NEGATIVE  NEGATIVE  GRAM STAIN     Status: None   Collection Time    10/12/13  4:45 PM      Result Value Ref Range   Specimen Description URINE, CATHETERIZED     Special Requests NONE     Gram Stain       Value: [id number]     WBC PRESENT,BOTH PMN AND MONONUCLEAR     NO ORGANISMS SEEN   Report Status 10/12/2013 FINAL    URINE CULTURE     Status: None   Collection Time    10/12/13  4:45 PM      Result Value  Ref Range    Specimen Description URINE, CATHETERIZED     Special Requests NONE     Culture  Setup Time       Value: 10/12/2013 22:11     Performed at [facility] Count       Value: NO GROWTH     Performed at [facility]   Culture       Value: NO GROWTH     Performed at [facility]   Report Status 10/13/2013 FINAL    URINE MICROSCOPIC-ADD ON     Status: Abnormal   Collection Time    10/12/13  4:45 PM      Result Value Ref Range   Squamous Epithelial / LPF RARE  RARE   Bacteria, UA RARE  RARE   Casts HYALINE CASTS (*) NEGATIVE  GLUCOSE, CAPILLARY     Status: Abnormal   Collection Time    10/12/13  6:29 PM      Result Value Ref Range   Glucose-Capillary 236 (*) 70 - 99 mg/dL  PROTIME-INR     Status: None   Collection Time    10/12/13  7:25 PM      Result Value Ref Range   Prothrombin Time 13.5  11.6 - 15.2 seconds   INR 1.05  0.00 - 1.49  TSH     Status: None   Collection Time    10/12/13  7:25 PM      Result Value Ref Range   TSH 2.481  0.350 - 4.500 uIU/mL   Comment: Performed at [facility]  TROPONIN I     Status: None   Collection Time    10/12/13  7:25 PM      Result Value Ref Range   Troponin I <0.30  <0.30 ng/mL   Comment:            Due to the release kinetics of cTnI,     a negative result within the first hours     of the onset of symptoms does not rule out     myocardial infarction with certainty.     If myocardial infarction is still suspected,     repeat the test at appropriate intervals.  CBC     Status: Abnormal   Collection Time    10/12/13  7:25 PM      Result Value Ref Range   WBC 4.9  4.0 - 10.5 K/uL   RBC 3.45 (*) 3.87 - 5.11 MIL/uL   Hemoglobin 10.2 (*) 12.0 - 15.0 g/dL   HCT 31.8 (*) 36.0 - 46.0 %   MCV 92.2  78.0 - 100.0 fL   MCH 29.6  26.0 - 34.0 pg   MCHC 32.1  30.0 - 36.0 g/dL   RDW 14.5  11.5 - 15.5 %   Platelets 283  150 - 400 K/uL  CREATININE, SERUM     Status: Abnormal   Collection Time     10/12/13  7:25 PM      Result Value Ref Range   Creatinine, Ser 1.60 (*) 0.50 - 1.10 mg/dL   GFR calc non Af Amer 32 (*) >90 mL/min   GFR calc Af Amer 37 (*) >90 mL/min   Comment: (NOTE)     The eGFR has been calculated using the CKD EPI equation.     This calculation has not been validated in all clinical situations.     eGFR's persistently <90 mL/min signify possible Chronic Kidney  Disease.  HEMOGLOBIN A1C     Status: Abnormal   Collection Time    10/12/13  7:25 PM      Result Value Ref Range   Hemoglobin A1C 7.4 (*) <5.7 %   Comment: (NOTE)                                                                               According to the ADA Clinical Practice Recommendations for 2011, when     HbA1c is used as a screening test:      >=6.5%   Diagnostic of Diabetes Mellitus               (if abnormal result is confirmed)     5.7-6.4%   Increased risk of developing Diabetes Mellitus     References:Diagnosis and Classification of Diabetes Mellitus,Diabetes     ONGE,9528,41(LKGMW 1):S62-S69 and Standards of Medical Care in             Diabetes - 2011,Diabetes Care,2011,34 (Suppl 1):S11-S61.   Mean Plasma Glucose 166 (*) <117 mg/dL   Comment: Performed at [place], CAPILLARY     Status: Abnormal   Collection Time    10/12/13  7:51 PM      Result Value Ref Range   Glucose-Capillary 230 (*) 70 - 99 mg/dL  BLOOD GAS, ARTERIAL     Status: Abnormal   Collection Time    10/12/13  8:15 PM      Result Value Ref Range   O2 Content 4.0     Delivery systems NASAL CANNULA     pH, Arterial 7.220 (*) 7.350 - 7.450   pCO2 arterial 81.0 (*) 35.0 - 45.0 mmHg   Comment: CRITICAL RESULT CALLED TO, READ BACK BY AND VERIFIED WITH:     Larose Kells, RN AT 2022, BY JESSICA MARSHBURN,RRT,RCP ON 10/12/2013   pO2, Arterial 90.6  80.0 - 100.0 mmHg   Bicarbonate 31.9 (*) 20.0 - 24.0 mEq/L   TCO2 34.4  0 - 100 mmol/L   Acid-Base Excess 4.6 (*) 0.0 - 2.0 mmol/L   O2 Saturation  95.5     Patient temperature 98.6     Collection site LEFT RADIAL     Drawn by [id number]     Sample type ARTERIAL DRAW     Allens test (pass/fail) PASS  PASS  GLUCOSE, CAPILLARY     Status: Abnormal   Collection Time    10/12/13  9:21 PM      Result Value Ref Range   Glucose-Capillary 187 (*) 70 - 99 mg/dL  BLOOD GAS, ARTERIAL     Status: Abnormal   Collection Time    10/12/13 10:04 PM      Result Value Ref Range   FIO2 0.50     Delivery systems VENTILATOR     Mode BILEVEL POSITIVE AIRWAY PRESSURE     Rate 15     Inspiratory PAP 12     Expiratory PAP 6     pH, Arterial 7.269 (*) 7.350 - 7.450   pCO2 arterial 71.0 (*) 35.0 - 45.0 mmHg   Comment: CRITICAL RESULT CALLED TO, READ BACK BY AND VERIFIED WITH:  Darral Dash, RN AT 2212, BY Alfred Levins RRT, RCP ON 10/12/2013   pO2, Arterial 84.7  80.0 - 100.0 mmHg   Bicarbonate 31.5 (*) 20.0 - 24.0 mEq/L   TCO2 33.7  0 - 100 mmol/L   Acid-Base Excess 5.0 (*) 0.0 - 2.0 mmol/L   O2 Saturation 95.3     Patient temperature 98.6     Collection site RIGHT RADIAL     Drawn by [id number]     Sample type ARTERIAL DRAW     Allens test (pass/fail) PASS  PASS  MRSA PCR SCREENING     Status: None   Collection Time    10/12/13 10:09 PM      Result Value Ref Range   MRSA by PCR NEGATIVE  NEGATIVE   Comment:            The GeneXpert MRSA Assay (FDA     approved for NASAL specimens     only), is one component of a     comprehensive MRSA colonization     surveillance program. It is not     intended to diagnose MRSA     infection nor to guide or     monitor treatment for     MRSA infections.  GLUCOSE, CAPILLARY     Status: Abnormal   Collection Time    10/13/13 12:19 AM      Result Value Ref Range   Glucose-Capillary 160 (*) 70 - 99 mg/dL   Comment 1 Documented in Chart     Comment 2 Notify RN    POCT I-STAT 3, BLOOD GAS (G3+)     Status: Abnormal   Collection Time    10/13/13 12:30 AM      Result Value Ref Range   pH, Arterial 7.509 (*)  7.350 - 7.450   pCO2 arterial 39.0  35.0 - 45.0 mmHg   pO2, Arterial 71.0 (*) 80.0 - 100.0 mmHg   Bicarbonate 31.1 (*) 20.0 - 24.0 mEq/L   TCO2 32  0 - 100 mmol/L   O2 Saturation 96.0     Acid-Base Excess 7.0 (*) 0.0 - 2.0 mmol/L   Patient temperature 98.5 F     Collection site RADIAL, ALLEN'S TEST ACCEPTABLE     Drawn by RT     Sample type ARTERIAL    TROPONIN I     Status: None   Collection Time    10/13/13 12:53 AM      Result Value Ref Range   Troponin I <0.30  <0.30 ng/mL   Comment:            Due to the release kinetics of cTnI,     a negative result within the first hours     of the onset of symptoms does not rule out     myocardial infarction with certainty.     If myocardial infarction is still suspected,     repeat the test at appropriate intervals.  TRIGLYCERIDES     Status: None   Collection Time    10/13/13 12:53 AM      Result Value Ref Range   Triglycerides 68  <150 mg/dL  POCT I-STAT 3, BLOOD GAS (G3+)     Status: Abnormal   Collection Time    10/13/13  2:59 AM      Result Value Ref Range   pH, Arterial 7.405  7.350 - 7.450   pCO2 arterial 53.6 (*) 35.0 - 45.0 mmHg   pO2, Arterial 70.0 (*) 80.0 -  100.0 mmHg   Bicarbonate 33.6 (*) 20.0 - 24.0 mEq/L   TCO2 35  0 - 100 mmol/L   O2 Saturation 93.0     Acid-Base Excess 8.0 (*) 0.0 - 2.0 mmol/L   Patient temperature 98.5 F     Collection site RADIAL, ALLEN'S TEST ACCEPTABLE     Drawn by RT     Sample type ARTERIAL    GLUCOSE, CAPILLARY     Status: Abnormal   Collection Time    10/13/13  3:40 AM      Result Value Ref Range   Glucose-Capillary 139 (*) 70 - 99 mg/dL   Comment 1 Documented in Chart     Comment 2 Notify RN    BASIC METABOLIC PANEL     Status: Abnormal   Collection Time    10/13/13  6:51 AM      Result Value Ref Range   Sodium 145  137 - 147 mEq/L   Potassium 3.9  3.7 - 5.3 mEq/L   Chloride 103  96 - 112 mEq/L   CO2 33 (*) 19 - 32 mEq/L   Glucose, Bld 121 (*) 70 - 99 mg/dL   BUN 35 (*) 6  - 23 mg/dL   Creatinine, Ser 1.79 (*) 0.50 - 1.10 mg/dL   Calcium 9.7  8.4 - 10.5 mg/dL   GFR calc non Af Amer 28 (*) >90 mL/min   GFR calc Af Amer 32 (*) >90 mL/min   Comment: (NOTE)     The eGFR has been calculated using the CKD EPI equation.     This calculation has not been validated in all clinical situations.     eGFR's persistently <90 mL/min signify possible Chronic Kidney     Disease.  TROPONIN I     Status: None   Collection Time    10/13/13  6:51 AM      Result Value Ref Range   Troponin I <0.30  <0.30 ng/mL   Comment:            Due to the release kinetics of cTnI,     a negative result within the first hours     of the onset of symptoms does not rule out     myocardial infarction with certainty.     If myocardial infarction is still suspected,     repeat the test at appropriate intervals.  GLUCOSE, CAPILLARY     Status: None   Collection Time    10/13/13  8:13 AM      Result Value Ref Range   Glucose-Capillary 98  70 - 99 mg/dL  GLUCOSE, CAPILLARY     Status: Abnormal   Collection Time    10/13/13 11:10 AM      Result Value Ref Range   Glucose-Capillary 118 (*) 70 - 99 mg/dL  GLUCOSE, CAPILLARY     Status: Abnormal   Collection Time    10/13/13  3:49 PM      Result Value Ref Range   Glucose-Capillary 115 (*) 70 - 99 mg/dL  GLUCOSE, CAPILLARY     Status: Abnormal   Collection Time    10/13/13  9:48 PM      Result Value Ref Range   Glucose-Capillary 186 (*) 70 - 99 mg/dL  GLUCOSE, CAPILLARY     Status: Abnormal   Collection Time    10/13/13 11:41 PM      Result Value Ref Range   Glucose-Capillary 208 (*) 70 - 99 mg/dL   Comment  1 Notify RN    GLUCOSE, CAPILLARY     Status: Abnormal   Collection Time    10/14/13  4:17 AM      Result Value Ref Range   Glucose-Capillary 186 (*) 70 - 99 mg/dL   Comment 1 Notify RN    BASIC METABOLIC PANEL     Status: Abnormal   Collection Time    10/14/13  4:55 AM      Result Value Ref Range   Sodium 143  137 - 147  mEq/L   Potassium 3.8  3.7 - 5.3 mEq/L   Chloride 99  96 - 112 mEq/L   CO2 33 (*) 19 - 32 mEq/L   Glucose, Bld 217 (*) 70 - 99 mg/dL   BUN 32 (*) 6 - 23 mg/dL   Creatinine, Ser 1.62 (*) 0.50 - 1.10 mg/dL   Calcium 9.8  8.4 - 10.5 mg/dL   GFR calc non Af Amer 32 (*) >90 mL/min   GFR calc Af Amer 37 (*) >90 mL/min   Comment: (NOTE)     The eGFR has been calculated using the CKD EPI equation.     This calculation has not been validated in all clinical situations.     eGFR's persistently <90 mL/min signify possible Chronic Kidney     Disease.  GLUCOSE, CAPILLARY     Status: Abnormal   Collection Time    10/14/13  7:44 AM      Result Value Ref Range   Glucose-Capillary 196 (*) 70 - 99 mg/dL  GLUCOSE, CAPILLARY     Status: Abnormal   Collection Time    10/14/13 11:40 AM      Result Value Ref Range   Glucose-Capillary 177 (*) 70 - 99 mg/dL  LACTATE DEHYDROGENASE, BODY FLUID     Status: Abnormal   Collection Time    10/14/13 12:20 PM      Result Value Ref Range   LD, Fluid 43 (*) 3 - 23 U/L   Fluid Type-FLDH PLEURAL     Comment: FLUID     RIGHT     CORRECTED ON 03/25 AT 1318: PREVIOUSLY REPORTED AS Pleural Fld  PROTEIN, BODY FLUID     Status: None   Collection Time    10/14/13 12:20 PM      Result Value Ref Range   Total protein, fluid 1.8     Comment: NO NORMAL RANGE ESTABLISHED FOR THIS TEST   Fluid Type-FTP PLEURAL     Comment: FLUID     FLUID     CORRECTED ON 03/25 AT 1318: PREVIOUSLY REPORTED AS Pleural Fld  BODY FLUID CELL COUNT WITH DIFFERENTIAL     Status: Abnormal   Collection Time    10/14/13 12:20 PM      Result Value Ref Range   Fluid Type-FCT PLEURAL     Comment: FLUID     RIGHT     CORRECTED ON 03/25 AT 1317: PREVIOUSLY REPORTED AS Body Fluid   Color, Fluid AMBER (*) YELLOW   Appearance, Fluid CLOUDY (*) CLEAR   WBC, Fluid 422  0 - 1000 cu mm   Neutrophil Count, Fluid 7  0 - 25 %   Lymphs, Fluid 84     Monocyte-Macrophage-Serous Fluid 9 (*) 50 - 90 %    Eos, Fluid 0    GLUCOSE, CAPILLARY     Status: None   Collection Time    10/14/13  3:33 PM      Result Value Ref Range  Glucose-Capillary 90  70 - 99 mg/dL    EKG: NSR, SB, TWI V2 V3 with poor ant RW  IMAGING: Dg Chest Portable 1 View  10/14/2013   CLINICAL DATA:  Status post right thoracentesis.  EXAM: PORTABLE CHEST - 1 VIEW  COMPARISON:  10/12/2013  FINDINGS: No significant residual right pleural fluid is seen. There is no pneumothorax. Mild atelectasis is noted at the right medial lung base. Right lung is otherwise clear.  There is opacity that persists at the left lung base obscuring the left heart border and left hemidiaphragm. This is consistent with a left fusion and atelectasis.  Cardiac silhouette is mildly enlarged.  Normal mediastinal contour.  IMPRESSION: 1. Right pleural effusion has been essentially evacuated. No pneumothorax. There is residual right medial lung base atelectasis.   Electronically Signed   By: Lajean Manes M.D.   On: 10/14/2013 13:10   Dg Chest Port 1 View  10/12/2013   CLINICAL DATA:  Endotracheal tube placement.  EXAM: PORTABLE CHEST - 1 VIEW  COMPARISON:  DG CHEST 2 VIEW dated 10/12/2013 at 1618 hr  FINDINGS: Endotracheal tube tip projects 10 mm above the carina. Nasogastric tube tip not identified but appears distal to the gastroesophageal junction region.  The cardiac silhouette appears moderately enlarged, mediastinal silhouette is nonsuspicious. Diffuse interstitial prominence with bibasilar airspace opacities, elevated right hemidiaphragm and small pleural effusions. No pneumothorax. Multiple EKG lines overlie the patient and may obscure subtle underlying pathology.  IMPRESSION: Endotracheal tube tip projects 10 mm above the carina, recommend 1 cm of retraction. Nasogastric tube tip not imaged though, appears at least past the gastroesophageal junction.  Stable cardiomegaly, interstitial prominence with bibasilar airspace opacities which favor confluent edema  with small pleural effusions.   Electronically Signed   By: Elon Alas   On: 10/12/2013 23:23   Dg Abd Portable 1v  10/12/2013   CLINICAL DATA:  OGT placement.  EXAM: PORTABLE ABDOMEN - 1 VIEW  COMPARISON:  No priors.  FINDINGS: Orogastric tube is seen coiled in the stomach with tip likely in the proximal duodenum and side port in the antral pre-pyloric region of the stomach. Visualized bowel gas pattern is nonobstructive. Coarse calcifications in the central pelvis compatible with known fibroids. Orthopedic fixation hardware in the lower lumbar spine.  IMPRESSION: 1. Tip of orogastric tube appears likely within the proximal duodenum.   Electronically Signed   By: Vinnie Langton M.D.   On: 10/12/2013 23:22    IMPRESSION: Principal Problem:   Acute respiratory failure with hypoxia Active Problems:   Acute diastolic congestive heart failure   Diabetes mellitus without complication   Hypertension   Obesity, morbid   Acute kidney injury   Pleural effusion- tapped   Abnormal EKG   Osteoporosis   Bipolar 1 disorder   Hyperlipidemia   Acute encephalopathy- resolved   RECOMMENDATION: MD to see. New abnormal EKG with septal TWI (compared to 12/14) in the setting of acute diastolic CHF. There was no WMA on echo. Her Troponin are negative x 3. She may be too obese for a reliable Myoview reading.  She had transient bradycardia down to 40 - (? Secondary to Sleep apnea) would avoid beta blocker. OP sleep study in the future.   Time Spent Directly with Patient: 45 minutes  Erlene Quan [phone] beeper 10/14/2013, 4:41 PM    KD:[id number] 14-Oct-2013 11:46:47 [facility] ROUTINE RECORD Sinus bradycardia T wave abnormality, consider anterior ischemia Prolonged QT Abnormal ECG 34mm/s 100mm/mV $RemoveBeforeD'100Hz'HUBPDblwdDcilj$  8.0.1 12SL  241 HD CID: [id number] Referred by: D SIMONDS Unconfirmed Vent. rate 59 BPM PR interval 174 ms QRS duration 94 ms QT/QTc 488/483 ms P-R-T axes 25 1 -62  Echo Study  Conclusions  - Left ventricle: The cavity size was normal. Wall thickness was increased in a pattern of moderate LVH. Systolic function was normal. The estimated ejection fraction was in the range of 60% to 65%. Wall motion was normal; there were no regional wall motion abnormalities. Doppler parameters are consistent with abnormal left ventricular relaxation (grade 1 diastolic dysfunction). - Left atrium: The atrium was mildly dilated. - Right atrium: The atrium was mildly dilated. - Pulmonary arteries: Systolic pressure was moderately increased. PA peak pressure: 25mm Hg (S). - Pericardium, extracardiac: A trivial, free-flowing pericardial effusion was identified posterior to the heart.    Patient seen and examined. Agree with assessment and plan. Very pleasant 68 yo obese WF with a cardiac risk profile notable for HTN, hyperlipidemia, DM. She is s/p pleurocentesis with 1100 cc fluid removed. She has document normal systolic fxn on echo with Gr 1 diastolic dysfunction and moderate pulmonary HTN. Hx is also suggestive of probable OSA. Her ECG's have shown difuse T wave inversion inferiorly and anterolaterally raising potential ischemic concerns. QTc is increased. She cannot walk well and denies chest pain but admits to dyspnea with mild activity. Will check serial cardiac enzymes. Would favor pharmacological stress test for further evaluation on Friday if schedule allows.  Troy Sine, MD, [facility] 10/14/2013 6:51 PM

## 2013-10-14 NOTE — Progress Notes (Signed)
PULMONARY / CRITICAL CARE MEDICINE   Name: Alyssa [name] MRN: [id number] DOB: Aug 29, 1945    ADMISSION DATE:  10/12/2013 CONSULTATION DATE:  10/12/13  REFERRING MD :  [facility] / Dr. Aileen Fass PRIMARY SERVICE: [facility] -->PCCM  CHIEF COMPLAINT:  AMS  BRIEF PATIENT DESCRIPTION:  68 y/o F admitted on 3/23 per [facility] with AMS & acute hypoxic respiratory failure thought related to CHF.  Patient progressively became more lethargic with pCO2 >80 & tx to ICU requiring intubation.    SIGNIFICANT EVENTS / STUDIES:  3/23 admit with AMS & acute hypercarbic respiratory failure thought related to CHF. Requiring intubation after failed NPPV trial. Furosemide infusion initiated.  3/23 CT head: NAICP 3/24 Passed SBT. Comfortable. Extubated successfully 3/24 Echo: LVEF 60-65%. LA mildly dilated 3/25 Furosemide infusion stopped. Oral furosemide resumed.  3/25 R thoracentesis: 1100 cc slightly cloudy, slightly pink fluid removed without complications. LDH 43, protein 1.8 c/w transudate   LINES / TUBES: ETT 3/23 >> 3/24  CULTURES: Urine 3/23 >> NEG Pleural fluid 3/25 >>   ANTIBIOTICS: Rocephin 3/23 x1 Azithro 3/23 x1    SUBJECTIVE:  Comf on [place] O2  VITAL SIGNS: Temp:  [97.7 F (36.5 C)-98.9 F (37.2 C)] 98.4 F (36.9 C) (03/25 1236) Pulse Rate:  [51-74] 54 (03/25 1400) Resp:  [14-24] 22 (03/25 1400) BP: (85-183)/(36-74) 131/55 mmHg (03/25 1400) SpO2:  [93 %-100 %] 97 % (03/25 1400) Weight:  [104 kg (229 lb 4.5 oz)] 104 kg (229 lb 4.5 oz) (03/25 0500)  HEMODYNAMICS:    VENTILATOR SETTINGS:    INTAKE / OUTPUT: Intake/Output     03/24 0701 - 03/25 0700 03/25 0701 - 03/26 0700   P.O.  240   I.V. (mL/kg) 240 (2.3) 46 (0.4)   Total Intake(mL/kg) 240 (2.3) 286 (2.8)   Urine (mL/kg/hr) 2550 (1) 450 (0.6)   Other  1100 (1.4)   Total Output 2550 1550   Net -2310 -1264          PHYSICAL EXAMINATION: General: obese female, NAD Neuro: RASS 0. + F/C. No focal defcits HEENT:  WNL Cardiovascular: bradycardic, regular, no M noted Lungs: bronchial BS and dullness to percussion in B bases (prior to thora) Abdomen:  Obese, soft, bsx4 active Ext: 1-2+ symmetric pitting edema  LABS: I have reviewed all of today's lab results. Relevant abnormalities are discussed in the A/P section  EKG: sinus brady. Ant-lat T wave inversion  CXR: Post thora film reveals much improved aeration of R hemithorax  ASSESSMENT / PLAN:  PULMONARY A: Acute Hypercarbic Respiratory Failure Pulmonary edema pattern on CXR B pleural effusions - transudates. Likely due to pulmonary edema P:   Cont supplemental O2 as needed   CARDIOVASCULAR A:  Decompensated CHF  Preserved LVEF Elevated pro BNP Bradycardia, resolved Abnormal EKG worrisome for ischemia Cardiac biomarkers negative HTN HLD P:  Cont tele monitoring Cards Consult req 3/25  RENAL A:   Acute Kidney Injury (Baseline Cr approx 1) - Cr cont to improve Hypervolemia - improving P:   Monitor BMET intermittently Monitor I/Os Correct electrolytes as indicated Cont diuresis with PO Lasix  GASTROINTESTINAL A:   Morbid Obesity P:   SUP: N/I Advance diet  HEMATOLOGIC A:   Anemia - no evidence of acute bleeding P:  DVT px: LMWH Monitor CBC intermittently  INFECTIOUS A:   No evidence of acute bacterial infection P:   Micro and abx as above  ENDOCRINE A:   DM   Hypothyroidism, TSH normal P:   Cont SSI  continue L thyroxine  NEUROLOGIC A:   Acute hypercarbic encephalopathy, resolved Chronic Pain syndrome Bipolar Disorder P:   minimize sedation as able    GLOBAL: Family and pt updated at bedside. Transfer to Tele. [facility] to resume primary duties as of AM 3/26 and [facility] to sign off. Cards consult requested 3/25 to eval persistent abnormalities on EKG   Merton Border, MD ; [facility] [phone].  After 5:30 PM or weekends, call [phone]

## 2013-10-15 ENCOUNTER — Other Ambulatory Visit: Payer: Medicare Other

## 2013-10-15 LAB — FUNGAL STAIN: Fungal Smear: NONE SEEN

## 2013-10-15 LAB — GLUCOSE, CAPILLARY
GLUCOSE-CAPILLARY: 121 mg/dL — AB (ref 70–99)
Glucose-Capillary: 146 mg/dL — ABNORMAL HIGH (ref 70–99)
Glucose-Capillary: 130 mg/dL — ABNORMAL HIGH (ref 70–99)
Glucose-Capillary: 177 mg/dL — ABNORMAL HIGH (ref 70–99)

## 2013-10-15 LAB — BASIC METABOLIC PANEL
BUN: 28 mg/dL — ABNORMAL HIGH (ref 6–23)
CO2: 31 meq/L (ref 19–32)
Calcium: 9.8 mg/dL (ref 8.4–10.5)
Chloride: 98 mEq/L (ref 96–112)
Creatinine, Ser: 1.31 mg/dL — ABNORMAL HIGH (ref 0.50–1.10)
GFR calc Af Amer: 47 mL/min — ABNORMAL LOW (ref >90)
GFR calc non Af Amer: 41 mL/min — ABNORMAL LOW (ref >90)
GLUCOSE: 139 mg/dL — AB (ref 70–99)
POTASSIUM: 3.9 meq/L (ref 3.7–5.3)
SODIUM: 144 meq/L (ref 137–147)

## 2013-10-15 LAB — TROPONIN I

## 2013-10-15 LAB — CBC
HCT: 34.9 % — ABNORMAL LOW (ref 36.0–46.0)
HEMOGLOBIN: 11 g/dL — AB (ref 12.0–15.0)
MCH: 28.4 pg (ref 26.0–34.0)
MCHC: 31.5 g/dL (ref 30.0–36.0)
MCV: 90.2 fL (ref 78.0–100.0)
Platelets: 280 10*3/uL (ref 150–400)
RBC: 3.87 MIL/uL (ref 3.87–5.11)
RDW: 14.1 % (ref 11.5–15.5)
WBC: 6.4 10*3/uL (ref 4.0–10.5)

## 2013-10-15 MED ORDER — LOSARTAN POTASSIUM 50 MG PO TABS
100.0000 mg | ORAL_TABLET | Freq: Every day | ORAL | Status: DC
  Administered 2013-10-15 – 2013-10-16 (×2): 100 mg via ORAL
  Filled 2013-10-15 (×2): qty 2

## 2013-10-15 MED ORDER — ATENOLOL 50 MG PO TABS
50.0000 mg | ORAL_TABLET | Freq: Two times a day (BID) | ORAL | Status: DC
  Filled 2013-10-15: qty 1

## 2013-10-15 MED ORDER — HYDROXYZINE HCL 50 MG/ML IM SOLN: 25.0000 mg | Freq: Four times a day (QID) | INTRAMUSCULAR | Status: DC | PRN

## 2013-10-15 MED ORDER — GLIPIZIDE 5 MG PO TABS
5.0000 mg | ORAL_TABLET | Freq: Two times a day (BID) | ORAL | Status: DC | PRN
  Administered 2013-10-16: 5 mg via ORAL
  Filled 2013-10-15: qty 1

## 2013-10-15 MED ORDER — MINOXIDIL 10 MG PO TABS
10.0000 mg | ORAL_TABLET | Freq: Every day | ORAL | Status: DC
  Administered 2013-10-15 – 2013-10-16 (×2): 10 mg via ORAL
  Filled 2013-10-15 (×2): qty 1

## 2013-10-15 MED ORDER — GABAPENTIN 600 MG PO TABS
600.0000 mg | ORAL_TABLET | Freq: Two times a day (BID) | ORAL | Status: DC
  Administered 2013-10-15 – 2013-10-16 (×2): 600 mg via ORAL
  Filled 2013-10-15 (×3): qty 1

## 2013-10-15 MED ORDER — HYDROXYZINE HCL 25 MG PO TABS: 50.0000 mg | ORAL_TABLET | Freq: Four times a day (QID) | ORAL | Status: DC | PRN

## 2013-10-15 NOTE — Progress Notes (Signed)
[facility] Progress Note Alyssa [name] TEAM 1 - Stepdown/ICU TEAM   Alyssa [name] WUJ:[id number] DOB: 1945/10/14 DOA: 10/12/2013 PCP: Alesia Richards, MD  Brief narrative: Alyssa [name] is a 68 y.o. female presenting on 10/12/2013 with  has a past medical history of Osteopenia; Osteoporosis; Diabetes mellitus without complication; Hypertension; Bipolar 1 disorder; Hyperlipidemia; Anemia;  vitamin D deficiency; Glaucoma; Chronic back pain; and Hypothyroidism who presents with AMS and hypoxic resp failure on 3/23. On the same day, due to increasing lethargy and pCO2> 80, she was intubated. She was successfully extubated the following day.   Resp failure is suspected to be due to CHF and noted to be in mod pulm HTN. She underwent a R thoracentesis on 3/25.  SIGNIFICANT EVENTS / STUDIES:  3/23 admit with AMS & acute hypercarbic respiratory failure thought related to CHF. Requiring intubation after failed NPPV trial. Furosemide infusion initiated.  3/23 CT head: NAICP  3/24 Passed SBT. Comfortable. Extubated successfully  3/24 Echo: LVEF 60-65%. LA mildly dilated  3/25 Furosemide infusion stopped. Oral furosemide resumed.  3/25 R thoracentesis: 1100 cc slightly cloudy, slightly pink fluid removed without complications. LDH 43, protein 1.8 c/w transudate  Subjective: No complaints.   Assessment/Plan: Principal Problem:   Acute respiratory failure with hypoxia/ hypercarbic Pulmonary edema with b/l effusions--  Acute diastolic congestive heart failure - s/p lasix infusion and then thoracentsis yesterday  Active Problems:  Acute kidney injury - follow with diuresis- baseline Cr 1.00    Diabetes mellitus without complication - pm glucotrol at home- cont sliding scale    Hypertension - resume all home meds except Atenolol (bradycardia)  as BP elevated    Abnormal EKG - myoview in AM    Bipolar 1 disorder - holding Trazodone    Hyperlipidemia - statin    Obesity,  morbid    Acute encephalopathy- resolved   Code Status: Full code Family Communication: husband Disposition Plan: none  Consultants: Cardiology  Procedures: ETT 3/23 >> 3/24 3/25- R thoracentesis  Antibiotics: ANTIBIOTICS:  Rocephin 3/23 x1  Azithro 3/23 x1   DVT prophylaxis: Lovenox  Objective: Filed Weights   10/13/13 0500 10/14/13 0500 10/15/13 0500  Weight: 107.8 kg (237 lb 10.5 oz) 104 kg (229 lb 4.5 oz) 104.5 kg (230 lb 6.1 oz)   Blood pressure 160/58, pulse 72, temperature 97.8 F (36.6 C), temperature source Oral, resp. rate 22, height 5\' 4"  (1.626 m), weight 104.5 kg (230 lb 6.1 oz), SpO2 95.00%.  Intake/Output Summary (Last 24 hours) at 10/15/13 1056 Last data filed at 10/14/13 1800  Gross per 24 hour  Intake     66 ml  Output   1900 ml  Net  -1834 ml     Exam: General: No acute respiratory distress Lungs: Clear to auscultation bilaterally without wheezes or crackles Cardiovascular: Regular rate and rhythm without murmur gallop or rub normal S1 and S2 Abdomen: Nontender, nondistended, soft, bowel sounds positive, no rebound, no ascites, no appreciable mass Extremities: No significant cyanosis, clubbing  + edema bilateral lower extremities  Data Reviewed: Basic Metabolic Panel:  Recent Labs Lab 10/09/13 1117 10/12/13 1527 10/12/13 1925 10/13/13 0651 10/14/13 0455 10/15/13 0745  NA 142 142  --  145 143 144  K 4.4 4.4  --  3.9 3.8 3.9  CL 100 100  --  103 99 98  CO2 32 32  --  33* 33* 31  GLUCOSE 145* 247*  --  121* 217* 139*  BUN 19 32*  --  35*  32* 28*  CREATININE 1.28* 1.54* 1.60* 1.79* 1.62* 1.31*  CALCIUM 10.0 10.2  --  9.7 9.8 9.8   Liver Function Tests:  Recent Labs Lab 10/09/13 1117 10/12/13 1527  AST 49* 53*  ALT 42* 53*  ALKPHOS 65 78  BILITOT 0.5 0.3  PROT 6.3 6.6  ALBUMIN 3.9 3.5   No results found for this basename: LIPASE, AMYLASE,  in the last 168 hours No results found for this basename: AMMONIA,  in the last 168  hours CBC:  Recent Labs Lab 10/09/13 1117 10/12/13 1527 10/12/13 1925 10/15/13 0745  WBC 7.1 5.9 4.9 6.4  NEUTROABS 5.0 4.1  --   --   HGB 10.7* 10.6* 10.2* 11.0*  HCT 32.8* 33.6* 31.8* 34.9*  MCV 86.5 91.1 92.2 90.2  PLT 311 297 283 280   Cardiac Enzymes:  Recent Labs Lab 10/12/13 1925 10/13/13 0053 10/13/13 0651 10/15/13 0745  TROPONINI <0.30 <0.30 <0.30 <0.30   BNP (last 3 results)  Recent Labs  10/12/13 1528  PROBNP 9728.0*   CBG:  Recent Labs Lab 10/14/13 0744 10/14/13 1140 10/14/13 1533 10/14/13 2223 10/15/13 0747  GLUCAP 196* 177* 90 111* 121*    Recent Results (from the past 240 hour(s))  GRAM STAIN     Status: None   Collection Time    10/12/13  4:45 PM      Result Value Ref Range Status   Specimen Description URINE, CATHETERIZED   Final   Special Requests NONE   Final   Gram Stain     Final   Value: CYTOSPUN     WBC PRESENT,BOTH PMN AND MONONUCLEAR     NO ORGANISMS SEEN   Report Status 10/12/2013 FINAL   Final  URINE CULTURE     Status: None   Collection Time    10/12/13  4:45 PM      Result Value Ref Range Status   Specimen Description URINE, CATHETERIZED   Final   Special Requests NONE   Final   Culture  Setup Time     Final   Value: 10/12/2013 22:11     Performed at [place]     Final   Value: NO GROWTH     Performed at [facility]   Culture     Final   Value: NO GROWTH     Performed at [facility]   Report Status 10/13/2013 FINAL   Final  MRSA PCR SCREENING     Status: None   Collection Time    10/12/13 10:09 PM      Result Value Ref Range Status   MRSA by PCR NEGATIVE  NEGATIVE Final   Comment:            The GeneXpert MRSA Assay (FDA     approved for NASAL specimens     only), is one component of a     comprehensive MRSA colonization     surveillance program. It is not     intended to diagnose MRSA     infection nor to guide or     monitor treatment for     MRSA  infections.  BODY FLUID CULTURE     Status: None   Collection Time    10/14/13 12:20 PM      Result Value Ref Range Status   Specimen Description PLEURAL FLUID RIGHT   Final   Special Requests 5.0ML FLUID   Final   Gram Stain  Final   Value: WBC PRESENT,BOTH PMN AND MONONUCLEAR     NO ORGANISMS SEEN     Performed at [facility]   Culture PENDING   Incomplete   Report Status PENDING   Incomplete     Studies:  Recent x-ray studies have been reviewed in detail by the Attending Physician  Scheduled Meds:  Scheduled Meds: . antiseptic oral rinse  15 mL Mouth Rinse q12n4p  . aspirin  81 mg Oral Daily  . atorvastatin  10 mg Oral q1800  . brimonidine  1 drop Both Eyes TID  . chlorhexidine  15 mL Mouth Rinse BID  . dorzolamide  1 drop Both Eyes BID  . enoxaparin (LOVENOX) injection  30 mg Subcutaneous Q24H  . furosemide  40 mg Oral BID  . glipiZIDE  5 mg Oral BID AC  . insulin aspart  0-15 Units Subcutaneous TID WC  . insulin aspart  0-5 Units Subcutaneous QHS  . latanoprost  1 drop Right Eye QHS  . levothyroxine  50 mcg Oral QAC breakfast  . magnesium oxide  400 mg Oral BID  . sodium chloride  3 mL Intravenous Q12H  . Vortioxetine HBr  10 mg Oral Daily   Continuous Infusions:   Time spent on care of this patient: 90 min   Debbe Odea, MD  [facility] Office  [phone] Pager - Text Page per Shea Evans as per below:  On-Call/Text Page:      [email or web address]  If 7PM-7AM, please contact night-coverage [email or web address] 10/15/2013, 10:56 AM   LOS: 3 days

## 2013-10-15 NOTE — Progress Notes (Signed)
     SUBJECTIVE: No chest pain. Breathing has improved.   BP 160/58  Pulse 72  Temp(Src) 97.8 F (36.6 C) (Oral)  Resp 22  Ht 5\' 4"  (1.626 m)  Wt 230 lb 6.1 oz (104.5 kg)  BMI 39.53 kg/m2  SpO2 95%  Intake/Output Summary (Last 24 hours) at 10/15/13 1111 Last data filed at 10/14/13 1800  Gross per 24 hour  Intake     60 ml  Output   1900 ml  Net  -1840 ml    PHYSICAL EXAM General: Well developed, well nourished, in no acute distress. Alert and oriented x 3.  Psych:  Good affect, responds appropriately Neck: No JVD. No masses noted.  Lungs: Clear bilaterally with no wheezes or rhonci noted.  Heart: RRR with no murmurs noted. Abdomen: Bowel sounds are present. Soft, non-tender.  Extremities: No lower extremity edema.   LABS: Basic Metabolic Panel:  Recent Labs  10/14/13 0455 10/15/13 0745  NA 143 144  K 3.8 3.9  CL 99 98  CO2 33* 31  GLUCOSE 217* 139*  BUN 32* 28*  CREATININE 1.62* 1.31*  CALCIUM 9.8 9.8   CBC:  Recent Labs  10/12/13 1527 10/12/13 1925 10/15/13 0745  WBC 5.9 4.9 6.4  NEUTROABS 4.1  --   --   HGB 10.6* 10.2* 11.0*  HCT 33.6* 31.8* 34.9*  MCV 91.1 92.2 90.2  PLT 297 283 280   Cardiac Enzymes:  Recent Labs  10/13/13 0053 10/13/13 0651 10/15/13 0745  TROPONINI <0.30 <0.30 <0.30   Fasting Lipid Panel:  Recent Labs  10/13/13 0053  TRIG 68    Current Meds: . antiseptic oral rinse  15 mL Mouth Rinse q12n4p  . aspirin  81 mg Oral Daily  . atorvastatin  10 mg Oral q1800  . brimonidine  1 drop Both Eyes TID  . chlorhexidine  15 mL Mouth Rinse BID  . dorzolamide  1 drop Both Eyes BID  . enoxaparin (LOVENOX) injection  30 mg Subcutaneous Q24H  . furosemide  40 mg Oral BID  . glipiZIDE  5 mg Oral BID AC  . insulin aspart  0-15 Units Subcutaneous TID WC  . insulin aspart  0-5 Units Subcutaneous QHS  . latanoprost  1 drop Right Eye QHS  . levothyroxine  50 mcg Oral QAC breakfast  . magnesium oxide  400 mg Oral BID  . sodium  chloride  3 mL Intravenous Q12H  . Vortioxetine HBr  10 mg Oral Daily     ASSESSMENT AND PLAN: 68 y.o. female with a past medical history significant for morbid obesity, NIDDM, HTN, and dyslipidemia admitted with SOB. She denies any prior history of CAD, MI, or CHF. She was admitted 10/12/13 with acute respiratory failure from acute diastolic CHF. Echo showed an EF of 60-65% with grade 1 diastolic dysfunction and PA pressure of 56 mmHg. She has diuresed and had thoracentesis of a right pleural effusion. EKG with anterior T wave inversions. She was seen by Dr. Claiborne Billings as a new consult 10/14/13.   1. Abnormal EKG: Per Dr. Claiborne Billings, would plan stress myoview Friday 10/16/13. She has risk factors for CAD including DM, HTN, HLD. NPO at [place]. Lexiscan stress myoview in am.   2. Acute diastolic CHF: SOB improved. Net negative 1800 cc last 24 hours. She is on po Lasix.   3. Right pleural effusion: s/p thoracentesis with 1100 cc fluid removed.     MCALHANY,CHRISTOPHER  3/26/201511:11 AM

## 2013-10-16 ENCOUNTER — Inpatient Hospital Stay (HOSPITAL_COMMUNITY): Payer: Medicare Other

## 2013-10-16 DIAGNOSIS — R9431 Abnormal electrocardiogram [ECG] [EKG]: Secondary | ICD-10-CM

## 2013-10-16 LAB — GLUCOSE, CAPILLARY
Glucose-Capillary: 94 mg/dL (ref 70–99)
Glucose-Capillary: 126 mg/dL — ABNORMAL HIGH (ref 70–99)

## 2013-10-16 MED ORDER — REGADENOSON 0.4 MG/5ML IV SOLN
0.4000 mg | Freq: Once | INTRAVENOUS | Status: DC
  Filled 2013-10-16: qty 5

## 2013-10-16 MED ORDER — TECHNETIUM TC 99M SESTAMIBI GENERIC - CARDIOLITE
10.0000 | Freq: Once | INTRAVENOUS | Status: AC | PRN
  Administered 2013-10-16: 10 via INTRAVENOUS

## 2013-10-16 MED ORDER — REGADENOSON 0.4 MG/5ML IV SOLN
INTRAVENOUS | Status: AC
  Administered 2013-10-16: 0.4 mg
  Filled 2013-10-16: qty 5

## 2013-10-16 MED ORDER — FUROSEMIDE 40 MG PO TABS: 40.0000 mg | ORAL_TABLET | Freq: Two times a day (BID) | ORAL | Status: DC

## 2013-10-16 MED ORDER — TRAZODONE HCL 150 MG PO TABS
150.0000 mg | ORAL_TABLET | Freq: Once | ORAL | Status: AC
  Administered 2013-10-16: 150 mg via ORAL
  Filled 2013-10-16: qty 1

## 2013-10-16 MED ORDER — TECHNETIUM TC 99M SESTAMIBI GENERIC - CARDIOLITE
30.0000 | Freq: Once | INTRAVENOUS | Status: AC | PRN
Start: 2013-10-16 — End: 2013-10-16
  Administered 2013-10-16: 30 via INTRAVENOUS

## 2013-10-16 MED ORDER — REGADENOSON 0.4 MG/5ML IV SOLN
INTRAVENOUS | Status: AC
  Filled 2013-10-16: qty 5

## 2013-10-16 NOTE — Care Management Note (Signed)
    Page 1 of 1   10/16/2013     3:50:27 PM   CARE MANAGEMENT NOTE 10/16/2013  Patient:  JENI, DULING   Account Number:  [id number]  Date Initiated:  10/13/2013  Documentation initiated by:  [facility]  Subjective/Objective Assessment:   Admitted with resp failure - intubated.     Action/Plan:   3/27 stress test- negative   Anticipated DC Date:  10/16/2013   Anticipated DC Plan:  HOME/SELF CARE  In-house referral  Clinical Social Worker      DC Planning Services  CM consult      Choice offered to / List presented to:             Status of service:  Completed, signed off Medicare Important Message given?   (If response is "NO", the following Medicare IM given date fields will be blank) Date Medicare IM given:   Date Additional Medicare IM given:    Discharge Disposition:  HOME/SELF CARE  Per UR Regulation:  Reviewed for med. necessity/level of care/duration of stay  If discussed at [place] of Stay Meetings, dates discussed:    Comments:  ContactRachel Moulds Daughter [phone]                 Lute,Michael Spouse [phone]  10-13-13 [place], RNBSN [phone] From home - ?? need for higher level.  SW consult placed.

## 2013-10-16 NOTE — Discharge Summary (Signed)
PATIENT DETAILS Name: Alyssa [name] Age: 68 y.o. Sex: female Date of Birth: 21-Aug-1945 MRN: [id number]. Admit Date: 10/12/2013 Admitting Physician: Charlynne Cousins, MD [id number] DAVID, MD  Recommendations for Outpatient Follow-up:  1. Please monitor chemistries closely, her dose of Lasix has been increased.  PRIMARY DISCHARGE DIAGNOSIS:  Principal Problem:   Acute respiratory failure with hypoxia Active Problems:   Osteoporosis   Diabetes mellitus without complication   Hypertension   Bipolar 1 disorder   Hyperlipidemia   Obesity, morbid   Acute encephalopathy- resolved   Acute kidney injury   Acute diastolic congestive heart failure   Pleural effusion- tapped   Abnormal EKG      PAST MEDICAL HISTORY: Past Medical History  Diagnosis Date  . Osteopenia   . Osteoporosis   . Diabetes mellitus without complication   . Hypertension   . Bipolar 1 disorder   . Hyperlipidemia   . Anemia   . Cataract   . Unspecified vitamin D deficiency   . Glaucoma   . Chronic back pain   . Hypothyroidism     DISCHARGE MEDICATIONS:   Medication List    STOP taking these medications       atenolol 50 MG tablet  Commonly known as:  TENORMIN     azithromycin 250 MG tablet  Commonly known as:  ZITHROMAX      TAKE these medications       aspirin 81 MG tablet  Take 81 mg by mouth daily.     brimonidine 0.1 % Soln  Commonly known as:  ALPHAGAN P  Place 1 drop into both eyes 2 (two) times daily.     BRINTELLIX 10 MG Tabs  Generic drug:  Vortioxetine HBr  Take 10 mg by mouth daily.     dorzolamide 2 % ophthalmic solution  Commonly known as:  TRUSOPT  Place 1 drop into both eyes 2 (two) times daily.     Fish Oil 1000 MG Caps  Take 1,000 mg by mouth every evening.     furosemide 40 MG tablet  Commonly known as:  LASIX  Take 1 tablet (40 mg total) by mouth 2 (two) times daily.     gabapentin 800 MG tablet  Commonly known as:  NEURONTIN  Take 800mg  by  mouth twice daily     glipiZIDE 10 MG tablet  Commonly known as:  GLUCOTROL  Take 5-10 mg by mouth 2 (two) times daily as needed (only takes if needed, depending on glucose level >200).     latanoprost 0.005 % ophthalmic solution  Commonly known as:  XALATAN  Place 1 drop into the right eye at bedtime.     levothyroxine 50 MCG tablet  Commonly known as:  SYNTHROID  Take 1 tablet (50 mcg total) by mouth daily before breakfast.     losartan 100 MG tablet  Commonly known as:  COZAAR  Take 100 mg by mouth daily.     magnesium oxide 400 MG tablet  Commonly known as:  MAG-OX  Take 1 tablet (400 mg total) by mouth 2 (two) times daily.     metFORMIN 500 MG 24 hr tablet  Commonly known as:  GLUCOPHAGE-XR  Take 1,000 mg by mouth 2 (two) times daily.     minoxidil 10 MG tablet  Commonly known as:  LONITEN  Take 10 mg by mouth daily.     multivitamin tablet  Take 1 tablet by mouth daily.     PROBIOTIC DAILY PO  Take  1 tablet by mouth daily.     rosuvastatin 5 MG tablet  Commonly known as:  CRESTOR  Take 5 mg by mouth daily.     traZODone 150 MG tablet  Commonly known as:  DESYREL  Take 150 mg by mouth at bedtime.     vitamin B-12 1000 MCG tablet  Commonly known as:  CYANOCOBALAMIN  Take 1,000 mcg by mouth daily.     VITAMIN D-3 PO  Take 5,000 Units by mouth every evening.        ALLERGIES:   Allergies  Allergen Reactions  . Ciprofloxacin Hcl Diarrhea  . Cymbalta [Duloxetine Hcl] Other (See Comments)    Per pt: it did not work  . Codeine Other (See Comments)    Per pt: unknown  . Lortab [Hydrocodone-Acetaminophen] Other (See Comments)    Per pt: unknown  . Oxycodone Other (See Comments)    Per pt: unknown    BRIEF HPI:  See H&P, Labs, Consult and Test reports for all details in brief, is a 68 y.o. female presenting on 10/12/2013 with has a past medical history of Osteopenia; Osteoporosis; Diabetes mellitus without complication; Hypertension; Bipolar 1 disorder;  Hyperlipidemia; Anemia; vitamin D deficiency; Glaucoma; Chronic back pain; and Hypothyroidism who presents with AMS and hypoxic resp failure on 3/23. On the same day, due to increasing lethargy and pCO2> 80, she was intubated.   CONSULTATIONS:   cardiology and pulmonary/intensive care  PERTINENT RADIOLOGIC STUDIES: Dg Chest 2 View  10/12/2013   CLINICAL DATA:  Shortness of Breath  EXAM: CHEST  2 VIEW  COMPARISON:  10/09/2013  FINDINGS: Study is limited by poor inspiration. Cardiomediastinal silhouette is stable. There is small right pleural effusion with right basilar atelectasis or infiltrate. Trace left basilar atelectasis or infiltrate. Central vascular congestion without convincing pulmonary edema.  IMPRESSION: Small right pleural effusion with right basilar atelectasis or infiltrate. No pulmonary edema. Trace left basilar atelectasis or infiltrate. Limited study by poor inspiration.   Electronically Signed   By: Lahoma Crocker M.D.   On: 10/12/2013 16:43   Dg Chest 2 View  10/09/2013   CLINICAL DATA:  History of diabetes now with weight gain and dyspnea  EXAM: CHEST  2 VIEW  COMPARISON:  PA and lateral chest x-ray of January 12, 2013 and lung windows from a CT scan of the abdomen and pelvis dated May 15, 2013.  FINDINGS: The cardiopericardial silhouette is enlarged. The pulmonary vascularity is mildly engorged. The pulmonary interstitial markings are mildly increased bilaterally. There is blunting of the right lateral and both posterior costophrenic angles. The mediastinum is normal in width. The trachea is midline. The observed portions of the bony thorax exhibit no acute abnormalities.  IMPRESSION: The findings are consistent with low-grade CHF superimposed upon COPD or reactive airway disease. No alveolar pneumonia is demonstrated.   Electronically Signed   By: David  Martinique   On: 10/09/2013 13:23   Ct Head Wo Contrast  10/12/2013   CLINICAL DATA:  Altered mental status, lethargy, confusion  EXAM:  CT HEAD WITHOUT CONTRAST  TECHNIQUE: Contiguous axial images were obtained from the base of the skull through the vertex without intravenous contrast.  COMPARISON:  None.  FINDINGS: No skull fracture is noted. Paranasal sinuses and mastoid air cells are unremarkable.  Moderate cerebral atrophy. Periventricular and patchy subcortical white matter decreased attenuation probable due to chronic small vessel ischemic changes. No intracranial hemorrhage, mass effect or midline shift. No acute cortical infarction. No mass lesion is noted on  this unenhanced scan.  IMPRESSION: No acute intracranial abnormality. Moderate cerebral atrophy. Periventricular and patchy subcortical white matter decreased attenuation probable due to chronic small vessel ischemic changes.   Electronically Signed   By: Lahoma Crocker M.D.   On: 10/12/2013 17:04   Nm Myocar Multi W/spect W/wall Motion / Ef  10/16/2013   CLINICAL DATA:  The patient has an abnormal EKG. This study is done for further evaluation.  EXAM: MYOCARDIAL IMAGING WITH SPECT (REST AND PHARMACOLOGIC-STRESS)  GATED LEFT VENTRICULAR WALL MOTION STUDY  LEFT VENTRICULAR EJECTION FRACTION  TECHNIQUE: Standard myocardial SPECT imaging was performed after resting intravenous injection of 10 mCi Tc-59m sestamibi. Subsequently, intravenous infusion of Lexiscan was performed under the supervision of the Cardiology staff. At peak effect of the drug, 30 mCi Tc-66m sestamibi was injected intravenously and standard myocardial SPECT imaging was performed. Quantitative gated imaging was also performed to evaluate left ventricular wall motion, and estimate left ventricular ejection fraction.  COMPARISON:  None.  FINDINGS: The patient was stressed with Lexiscan. There were no diagnostic EKG changes. The raw data reveals no excess motion. Tomographic images at rest revealed normal uptake in all segments. Tomographic images with stress revealed normal uptake in all segments. Wall motion analysis  reveals normal motion. The ejection fraction is 65%. There is no scar or ischemia.  IMPRESSION: Normal pharmacologic stress nuclear scan. No scar or ischemia. Normal wall motion.   Electronically Signed   By: Dola Argyle   On: 10/16/2013 12:26   Dg Chest Portable 1 View  10/14/2013   CLINICAL DATA:  Status post right thoracentesis.  EXAM: PORTABLE CHEST - 1 VIEW  COMPARISON:  10/12/2013  FINDINGS: No significant residual right pleural fluid is seen. There is no pneumothorax. Mild atelectasis is noted at the right medial lung base. Right lung is otherwise clear.  There is opacity that persists at the left lung base obscuring the left heart border and left hemidiaphragm. This is consistent with a left fusion and atelectasis.  Cardiac silhouette is mildly enlarged.  Normal mediastinal contour.  IMPRESSION: 1. Right pleural effusion has been essentially evacuated. No pneumothorax. There is residual right medial lung base atelectasis.   Electronically Signed   By: Lajean Manes M.D.   On: 10/14/2013 13:10   Dg Chest Port 1 View  10/12/2013   CLINICAL DATA:  Endotracheal tube placement.  EXAM: PORTABLE CHEST - 1 VIEW  COMPARISON:  DG CHEST 2 VIEW dated 10/12/2013 at 1618 hr  FINDINGS: Endotracheal tube tip projects 10 mm above the carina. Nasogastric tube tip not identified but appears distal to the gastroesophageal junction region.  The cardiac silhouette appears moderately enlarged, mediastinal silhouette is nonsuspicious. Diffuse interstitial prominence with bibasilar airspace opacities, elevated right hemidiaphragm and small pleural effusions. No pneumothorax. Multiple EKG lines overlie the patient and may obscure subtle underlying pathology.  IMPRESSION: Endotracheal tube tip projects 10 mm above the carina, recommend 1 cm of retraction. Nasogastric tube tip not imaged though, appears at least past the gastroesophageal junction.  Stable cardiomegaly, interstitial prominence with bibasilar airspace opacities  which favor confluent edema with small pleural effusions.   Electronically Signed   By: Elon Alas   On: 10/12/2013 23:23   Dg Abd Portable 1v  10/12/2013   CLINICAL DATA:  OGT placement.  EXAM: PORTABLE ABDOMEN - 1 VIEW  COMPARISON:  No priors.  FINDINGS: Orogastric tube is seen coiled in the stomach with tip likely in the proximal duodenum and side port in the antral pre-pyloric  region of the stomach. Visualized bowel gas pattern is nonobstructive. Coarse calcifications in the central pelvis compatible with known fibroids. Orthopedic fixation hardware in the lower lumbar spine.  IMPRESSION: 1. Tip of orogastric tube appears likely within the proximal duodenum.   Electronically Signed   By: Trudie Reed M.D.   On: 10/12/2013 23:22     PERTINENT LAB RESULTS: CBC:  Recent Labs  10/15/13 0745  WBC 6.4  HGB 11.0*  HCT 34.9*  PLT 280   CMET CMP     Component Value Date/Time   NA 144 10/15/2013 0745   K 3.9 10/15/2013 0745   CL 98 10/15/2013 0745   CO2 31 10/15/2013 0745   GLUCOSE 139* 10/15/2013 0745   BUN 28* 10/15/2013 0745   CREATININE 1.31* 10/15/2013 0745   CREATININE 1.28* 10/09/2013 1117   CALCIUM 9.8 10/15/2013 0745   PROT 6.6 10/12/2013 1527   ALBUMIN 3.5 10/12/2013 1527   AST 53* 10/12/2013 1527   ALT 53* 10/12/2013 1527   ALKPHOS 78 10/12/2013 1527   BILITOT 0.3 10/12/2013 1527   GFRNONAA 41* 10/15/2013 0745   GFRNONAA 43* 10/09/2013 1117   GFRAA 47* 10/15/2013 0745   GFRAA 50* 10/09/2013 1117    GFR Estimated Creatinine Clearance: 47.7 ml/min (by C-G formula based on Cr of 1.31). No results found for this basename: LIPASE, AMYLASE,  in the last 72 hours  Recent Labs  10/15/13 0745  TROPONINI <0.30   No components found with this basename: POCBNP,  No results found for this basename: DDIMER,  in the last 72 hours No results found for this basename: HGBA1C,  in the last 72 hours No results found for this basename: CHOL, HDL, LDLCALC, TRIG, CHOLHDL, LDLDIRECT,  in  the last 72 hours No results found for this basename: TSH, T4TOTAL, FREET3, T3FREE, THYROIDAB,  in the last 72 hours No results found for this basename: VITAMINB12, FOLATE, FERRITIN, TIBC, IRON, RETICCTPCT,  in the last 72 hours Coags: No results found for this basename: PT, INR,  in the last 72 hours Microbiology: Recent Results (from the past 240 hour(s))  GRAM STAIN     Status: None   Collection Time    10/12/13  4:45 PM      Result Value Ref Range Status   Specimen Description URINE, CATHETERIZED   Final   Special Requests NONE   Final   Gram Stain     Final   Value: CYTOSPUN     WBC PRESENT,BOTH PMN AND MONONUCLEAR     NO ORGANISMS SEEN   Report Status 10/12/2013 FINAL   Final  URINE CULTURE     Status: None   Collection Time    10/12/13  4:45 PM      Result Value Ref Range Status   Specimen Description URINE, CATHETERIZED   Final   Special Requests NONE   Final   Culture  Setup Time     Final   Value: 10/12/2013 22:11     Performed at [facility] Count     Final   Value: NO GROWTH     Performed at [facility]   Culture     Final   Value: NO GROWTH     Performed at [facility]   Report Status 10/13/2013 FINAL   Final  MRSA PCR SCREENING     Status: None   Collection Time    10/12/13 10:09 PM      Result Value Ref  Range Status   MRSA by PCR NEGATIVE  NEGATIVE Final   Comment:            The GeneXpert MRSA Assay (FDA     approved for NASAL specimens     only), is one component of a     comprehensive MRSA colonization     surveillance program. It is not     intended to diagnose MRSA     infection nor to guide or     monitor treatment for     MRSA infections.  FUNGAL STAIN     Status: None   Collection Time    10/14/13 12:20 PM      Result Value Ref Range Status   Specimen Description PLEURAL FLUID RIGHT   Final   Special Requests 5.0ML FLUID   Final   Fungal Smear     Final   Value: NO YEAST OR FUNGAL ELEMENTS SEEN      Performed at [facility]   Report Status 10/15/2013 FINAL   Final  BODY FLUID CULTURE     Status: None   Collection Time    10/14/13 12:20 PM      Result Value Ref Range Status   Specimen Description PLEURAL FLUID RIGHT   Final   Special Requests 5.0ML FLUID   Final   Gram Stain     Final   Value: WBC PRESENT,BOTH PMN AND MONONUCLEAR     NO ORGANISMS SEEN     Performed at [facility]   Culture     Final   Value: NO GROWTH 2 DAYS     Performed at [place] COURSE:   SIGNIFICANT EVENTS / STUDIES:  3/23 admit with AMS & acute hypercarbic respiratory failure thought related to CHF. Requiring intubation after failed NPPV trial. Furosemide infusion initiated.  3/23 CT head: NAICP  3/24 Passed SBT. Comfortable. Extubated successfully  3/24 Echo: LVEF 60-65%. LA mildly dilated  3/25 Furosemide infusion stopped. Oral furosemide resumed.  3/25 R thoracentesis: 1100 cc slightly cloudy, slightly pink fluid removed without complications. LDH 43, protein 1.8 c/w transudate   Acute respiratory failure with hypoxia/ hypercarbic - Secondary to acute diastolic heart failure, as noted above, on admission patient worsened significantly and needed to be intubated. She was diuresed, subsequently extubated. Cardiology was consulted. She also underwent thoracocentesis for right-sided pleural effusion. With diuresis, she has improved significantly, and is clinically compensated.Echo showed an EF of 60-65% with grade 1 diastolic dysfunction and PA pressure of 56 mmHg. she is now being transitioned to oral Lasix, and on discharge she will be continued on oral Lasix. Her EKG did show anterior T wave inversions, patient underwent a nuclear stress test on 3/27 which was negative. I have spoken with cardiology-Dr. Angelena Form, no further recommendations by cardiology at this time. Since he is clinically euvolemic and well compensated,  she is being discharged home in a stable manner.  Acute diastolic heart failure - Significantly improved. Please see above for further details. Negative balance of 3974 since admission, weight down to 224 pounds by the day of discharge. - Followup appointment with cardiology has been made. Extensive education has been done on the day of discharge by this M.D. with the patient and spouse at bedside.  Acute encephalopathy - resolved, likely secondary to acute respiratory failure. CT head on 3/23 negative for acute intracranial abnormality.  Right-sided pleural effusion - Status  post R thoracentesis: 1100 cc slightly cloudy, slightly pink fluid removed without complications. LDH 43, protein 1.8 c/w transudate  Hypertension - Patient will be resumed on all of her prior medications with the exception of atenolol. Atenolol is being held in the hospital because of bradycardia. Blood pressure is moderately well controlled. Further optimization can be done in the outpatient setting.  Abnormal EKG - Underwent stress Myoview on 3/27 negative for ischemia. No further workup being recommended by cardiology at this time.  Diabetes - Continue with Glucotrol on discharge.  Hyperlipidemia - Continue with statins on discharge  History of bipolar disorder - Resume prior medications. Stable this admission.  TODAY-DAY OF DISCHARGE:  Subjective:   Alyssa [name] today has no headache,no chest abdominal pain,no new weakness tingling or numbness, feels much better wants to go home today.   Objective:   Blood pressure 106/57, pulse 80, temperature 98 F (36.7 C), temperature source Oral, resp. rate 18, height 5\' 4"  (1.626 m), weight 101.7 kg (224 lb 3.3 oz), SpO2 94.00%.  Intake/Output Summary (Last 24 hours) at 10/16/13 1510 Last data filed at 10/16/13 1300  Gross per 24 hour  Intake      3 ml  Output      0 ml  Net      3 ml   Filed Weights   10/14/13 0500 10/15/13 0500 10/16/13 0421  Weight:  104 kg (229 lb 4.5 oz) 104.5 kg (230 lb 6.1 oz) 101.7 kg (224 lb 3.3 oz)    Exam Awake Alert, Oriented *3, No new F.N deficits, Normal affect [place].AT,PERRAL Supple Neck,No JVD, No cervical lymphadenopathy appriciated.  Symmetrical Chest wall movement, Good air movement bilaterally, CTAB RRR,No Gallops,Rubs or new Murmurs, No Parasternal Heave +ve B.Sounds, Abd Soft, Non tender, No organomegaly appriciated, No rebound -guarding or rigidity. No Cyanosis, Clubbing or edema, No new Rash or bruise  DISCHARGE CONDITION: Stable  DISPOSITION: Home  DISCHARGE INSTRUCTIONS:    Activity:  As tolerated with Full fall precautions use walker/cane & assistance as needed  Diet recommendation: Diabetic Diet Heart Healthy diet Fluid restriction 1.5 lit/day   Discharge Orders   Future Appointments Provider Department Dept Phone   10/30/2013 9:30 AM Pixie Casino, MD [facility] [phone]   11/19/2013 4:30 PM Unk Pinto, MD [facility] [phone]   06/23/2014 10:00 AM Ardis Hughs, PA-C [facility] [phone]   Future Orders Complete By Expires   (HEART FAILURE PATIENTS) Call MD:  Anytime you have any of the following symptoms: 1) 3 pound weight gain in 24 hours or 5 pounds in 1 week 2) shortness of breath, with or without a dry hacking cough 3) swelling in the hands, feet or stomach 4) if you have to sleep on extra pillows at night in order to breathe.  As directed    Diet - low sodium heart healthy  As directed    Diet Carb Modified  As directed    Increase activity slowly  As directed       Follow-up Information   Follow up with Pixie Casino, MD On 10/30/2013. (appt @ 9:30 am)    Specialty:  Cardiology   Contact information:   [place] [address] [phone]       Follow up with Unk Pinto DAVID, MD. Schedule an appointment as soon as possible for a  visit in 2 weeks.   Specialty:  Internal Medicine   Contact information:   [phone]  [address] [place] [address] [phone]         Total Time spent on discharge equals 45 minutes.  SignedOren Binet 10/16/2013 3:10 PM

## 2013-10-16 NOTE — Progress Notes (Signed)
Subjective:  No increased SOB overnight  Objective:  Vital Signs in the last 24 hours: Temp:  [97.6 F (36.4 C)-98.8 F (37.1 C)] 98.6 F (37 C) (03/27 0450) Pulse Rate:  [56-71] 71 (03/27 1032) Resp:  [20] 20 (03/27 0450) BP: (164-189)/(63-91) 179/84 mmHg (03/27 1032) SpO2:  [95 %-96 %] 96 % (03/27 0450) Weight:  [224 lb 3.3 oz (101.7 kg)] 224 lb 3.3 oz (101.7 kg) (03/27 0421)  Intake/Output from previous day:  Intake/Output Summary (Last 24 hours) at 10/16/13 1045 Last data filed at 10/15/13 2206  Gross per 24 hour  Intake      3 ml  Output      0 ml  Net      3 ml    Physical Exam: General appearance: alert, cooperative, no distress and morbidly obese Lungs: decreased breath sounds Lt 1/3, crackles Rt base Heart: regular rate and rhythm   Rate: 72  Rhythm: normal sinus rhythm  Lab Results:  Recent Labs  10/15/13 0745  WBC 6.4  HGB 11.0*  PLT 280    Recent Labs  10/14/13 0455 10/15/13 0745  NA 143 144  K 3.8 3.9  CL 99 98  CO2 33* 31  GLUCOSE 217* 139*  BUN 32* 28*  CREATININE 1.62* 1.31*    Recent Labs  10/15/13 0745  TROPONINI <0.30   No results found for this basename: INR,  in the last 72 hours  Imaging: Dg Chest Portable 1 View  10/14/2013   CLINICAL DATA:  Status post right thoracentesis.  EXAM: PORTABLE CHEST - 1 VIEW  COMPARISON:  10/12/2013  FINDINGS: No significant residual right pleural fluid is seen. There is no pneumothorax. Mild atelectasis is noted at the right medial lung base. Right lung is otherwise clear.  There is opacity that persists at the left lung base obscuring the left heart border and left hemidiaphragm. This is consistent with a left fusion and atelectasis.  Cardiac silhouette is mildly enlarged.  Normal mediastinal contour.  IMPRESSION: 1. Right pleural effusion has been essentially evacuated. No pneumothorax. There is residual right medial lung base atelectasis.   Electronically Signed   By: Lajean Manes M.D.    On: 10/14/2013 13:10    Cardiac Studies:  Assessment/Plan:  68 yo obese WF with a cardiac risk profile notable for HTN, hyperlipidemia, DM. She is s/p pleurocentesis with 1100 cc fluid removed. She has documented normal systolic fxn on echo with Gr 1 diastolic dysfunction and moderate pulmonary HTN. Hx is also suggestive of probable OSA. Her ECG's have shown difuse T wave inversion inferiorly and anterolaterally raising potential ischemic concerns   Principal Problem:   Acute respiratory failure with hypoxia Active Problems:   Acute diastolic congestive heart failure   Diabetes mellitus without complication   Hypertension   Obesity, morbid   Acute kidney injury   Pleural effusion- tapped   Abnormal EKG   Osteoporosis   Bipolar 1 disorder   Hyperlipidemia   Acute encephalopathy- resolved    PLAN: Lexiscan Myoview today.  Kerin Ransom PA-C [facility] [phone] 10/16/2013, 10:45 AM   I have personally seen and examined this patient with Kerin Ransom, PA-C. I agree with the assessment and plan as outlined above. 68 y.o. female with a past medical history significant for morbid obesity, NIDDM, HTN, and dyslipidemia admitted with SOB. She denies any prior history of CAD, MI, or CHF. She was admitted 10/12/13 with acute respiratory failure from acute diastolic CHF. Echo showed an EF of  60-65% with grade 1 diastolic dysfunction and PA pressure of 56 mmHg. She has diuresed and had thoracentesis of a right pleural effusion. EKG with anterior T wave inversions. She was seen by Dr. Claiborne Billings as a new consult 10/14/13.  1. Abnormal EKG: Stress myoview without ischemia. No further cardiac workup.  2. Acute diastolic CHF: SOB improved. After diuresis with Lasix and thoracentesis.   3. Right pleural effusion: s/p thoracentesis with 1100 cc fluid removed.   No further cardiac workup. She can follow up with Dr. Ellouise Newer in the [facility] office for management of her diastolic CHF.    MCALHANY,CHRISTOPHER 10/16/2013 1:44 PM

## 2013-10-16 NOTE — Discharge Instructions (Signed)
Heart Failure °Heart failure is a condition in which the heart has trouble pumping blood. This means your heart does not pump blood efficiently for your body to work well. In some cases of heart failure, fluid may back up into your lungs or you may have swelling (edema) in your lower legs. Heart failure is usually a long-term (chronic) condition. It is important for you to take good care of yourself and follow your caregiver's treatment plan. °CAUSES  °Some health conditions can cause heart failure. Those health conditions include: °· High blood pressure (hypertension) causes the heart muscle to work harder than normal. When pressure in the blood vessels is high, the heart needs to pump (contract) with more force in order to circulate blood throughout the body. High blood pressure eventually causes the heart to become stiff and weak. °· Coronary artery disease (CAD) is the buildup of cholesterol and fat (plaque) in the arteries of the heart. The blockage in the arteries deprives the heart muscle of oxygen and blood. This can cause chest pain and may lead to a heart attack. High blood pressure can also contribute to CAD. °· Heart attack (myocardial infarction) occurs when 1 or more arteries in the heart become blocked. The loss of oxygen damages the muscle tissue of the heart. When this happens, part of the heart muscle dies. The injured tissue does not contract as well and weakens the heart's ability to pump blood. °· Abnormal heart valves can cause heart failure when the heart valves do not open and close properly. This makes the heart muscle pump harder to keep the blood flowing. °· Heart muscle disease (cardiomyopathy or myocarditis) is damage to the heart muscle from a variety of causes. These can include drug or alcohol abuse, infections, or unknown reasons. These can increase the risk of heart failure. °· Lung disease makes the heart work harder because the lungs do not work properly. This can cause a strain  on the heart, leading it to fail. °· Diabetes increases the risk of heart failure. High blood sugar contributes to high fat (lipid) levels in the blood. Diabetes can also cause slow damage to tiny blood vessels that carry important nutrients to the heart muscle. When the heart does not get enough oxygen and food, it can cause the heart to become weak and stiff. This leads to a heart that does not contract efficiently. °· Other conditions can contribute to heart failure. These include abnormal heart rhythms, thyroid problems, and low blood counts (anemia). °Certain unhealthy behaviors can increase the risk of heart failure. Those unhealthy behaviors include: °· Being overweight. °· Smoking or chewing tobacco. °· Eating foods high in fat and cholesterol. °· Abusing illicit drugs or alcohol. °· Lacking physical activity. °SYMPTOMS  °Heart failure symptoms may vary and can be hard to detect. Symptoms may include: °· Shortness of breath with activity, such as climbing stairs. °· Persistent cough. °· Swelling of the feet, ankles, legs, or abdomen. °· Unexplained weight gain. °· Difficulty breathing when lying flat (orthopnea). °· Waking from sleep because of the need to sit up and get more air. °· Rapid heartbeat. °· Fatigue and loss of energy. °· Feeling lightheaded, dizzy, or close to fainting. °· Loss of appetite. °· Nausea. °· Increased urination during the night (nocturia). °DIAGNOSIS  °A diagnosis of heart failure is based on your history, symptoms, physical examination, and diagnostic tests. °Diagnostic tests for heart failure may include: °· Echocardiography. °· Electrocardiography. °· Chest X-ray. °· Blood tests. °· Exercise   stress test. °· Cardiac angiography. °· Radionuclide scans. °TREATMENT  °Treatment is aimed at managing the symptoms of heart failure. Medicines, behavioral changes, or surgical intervention may be necessary to treat heart failure. °· Medicines to help treat heart failure may  include: °· Angiotensin-converting enzyme (ACE) inhibitors. This type of medicine blocks the effects of a blood protein called angiotensin-converting enzyme. ACE inhibitors relax (dilate) the blood vessels and help lower blood pressure. °· Angiotensin receptor blockers. This type of medicine blocks the actions of a blood protein called angiotensin. Angiotensin receptor blockers dilate the blood vessels and help lower blood pressure. °· Water pills (diuretics). Diuretics cause the kidneys to remove salt and water from the blood. The extra fluid is removed through urination. This loss of extra fluid lowers the volume of blood the heart pumps. °· Beta blockers. These prevent the heart from beating too fast and improve heart muscle strength. °· Digitalis. This increases the force of the heartbeat. °· Healthy behavior changes include: °· Obtaining and maintaining a healthy weight. °· Stopping smoking or chewing tobacco. °· Eating heart healthy foods. °· Limiting or avoiding alcohol. °· Stopping illicit drug use. °· Physical activity as directed by your caregiver. °· Surgical treatment for heart failure may include: °· A procedure to open blocked arteries, repair damaged heart valves, or remove damaged heart muscle tissue. °· A pacemaker to improve heart muscle function and control certain abnormal heart rhythms. °· An internal cardioverter defibrillator to treat certain serious abnormal heart rhythms. °· A left ventricular assist device to assist the pumping ability of the heart. °HOME CARE INSTRUCTIONS  °· Take your medicine as directed by your caregiver. Medicines are important in reducing the workload of your heart, slowing the progression of heart failure, and improving your symptoms. °· Do not stop taking your medicine unless directed by your caregiver. °· Do not skip any dose of medicine. °· Refill your prescriptions before you run out of medicine. Your medicines are needed every day. °· Take over-the-counter  medicine only as directed by your caregiver or pharmacist. °· Engage in moderate physical activity if directed by your caregiver. Moderate physical activity can benefit some people. The elderly and people with severe heart failure should consult with a caregiver for physical activity recommendations. °· Eat heart healthy foods. Food choices should be free of trans fat and low in saturated fat, cholesterol, and salt (sodium). Healthy choices include fresh or frozen fruits and vegetables, fish, lean meats, legumes, fat-free or low-fat dairy products, and whole grain or high fiber foods. Talk to a dietitian to learn more about heart healthy foods. °· Limit sodium if directed by your caregiver. Sodium restriction may reduce symptoms of heart failure in some people. Talk to a dietitian to learn more about heart healthy seasonings. °· Use healthy cooking methods. Healthy cooking methods include roasting, grilling, broiling, baking, poaching, steaming, or stir-frying. Talk to a dietitian to learn more about healthy cooking methods. °· Limit fluids if directed by your caregiver. Fluid restriction may reduce symptoms of heart failure in some people. °· Weigh yourself every day. Daily weights are important in the early recognition of excess fluid. You should weigh yourself every morning after you urinate and before you eat breakfast. Wear the same amount of clothing each time you weigh yourself. Record your daily weight. Provide your caregiver with your weight record. °· Monitor and record your blood pressure if directed by your caregiver. °· Check your pulse if directed by your caregiver. °· Lose weight if directed   by your caregiver. Weight loss may reduce symptoms of heart failure in some people. °· Stop smoking or chewing tobacco. Nicotine makes your heart work harder by causing your blood vessels to constrict. Do not use nicotine gum or patches before talking to your caregiver. °· Schedule and attend follow-up visits as  directed by your caregiver. It is important to keep all your appointments. °· Limit alcohol intake to no more than 1 drink per day for nonpregnant women and 2 drinks per day for men. Drinking more than that is harmful to your heart. Tell your caregiver if you drink alcohol several times a week. Talk with your caregiver about whether alcohol is safe for you. If your heart has already been damaged by alcohol or you have severe heart failure, drinking alcohol should be stopped completely. °· Stop illicit drug use. °· Stay up-to-date with immunizations. It is especially important to prevent respiratory infections through current pneumococcal and influenza immunizations. °· Manage other health conditions such as hypertension, diabetes, thyroid disease, or abnormal heart rhythms as directed by your caregiver. °· Learn to manage stress. °· Plan rest periods when fatigued. °· Learn strategies to manage high temperatures. If the weather is extremely hot: °· Avoid vigorous physical activity. °· Use air conditioning or fans or seek a cooler location. °· Avoid caffeine and alcohol. °· Wear loose-fitting, lightweight, and light-colored clothing. °· Learn strategies to manage cold temperatures. If the weather is extremely cold: °· Avoid vigorous physical activity. °· Layer clothes. °· Wear mittens or gloves, a hat, and a scarf when going outside. °· Avoid alcohol. °· Obtain ongoing education and support as needed. °· Participate or seek rehabilitation as needed to maintain or improve independence and quality of life. °SEEK MEDICAL CARE IF:  °· Your weight increases by 03 lb/1.4 kg in 1 day or 05 lb/2.3 kg in a week. °· You have increasing shortness of breath that is unusual for you. °· You are unable to participate in your usual physical activities. °· You tire easily. °· You cough more than normal, especially with physical activity. °· You have any or more swelling in areas such as your hands, feet, ankles, or abdomen. °· You  are unable to sleep because it is hard to breathe. °· You feel like your heart is beating fast (palpitations). °· You become dizzy or lightheaded upon standing up. °SEEK IMMEDIATE MEDICAL CARE IF:  °· You have difficulty breathing. °· There is a change in mental status such as decreased alertness or difficulty with concentration. °· You have a pain or discomfort in your chest. °· You have an episode of fainting (syncope). °MAKE SURE YOU:  °· Understand these instructions. °· Will watch your condition. °· Will get help right away if you are not doing well or get worse. °Document Released: 07/09/2005 Document Revised: 11/03/2012 Document Reviewed: 07/31/2012 °[employer]® Patient Information ©2014 [employer]. ° °

## 2013-10-16 NOTE — Progress Notes (Signed)
Nsg Discharge Note  Admit Date:  10/12/2013 Discharge date: 10/16/2013   Alyssa [name] to be D/C'd Home per MD order.  AVS completed.  Copy for chart, and copy for patient signed, and dated. Patient/caregiver able to verbalize understanding.  Discharge Medication:   Medication List    STOP taking these medications       atenolol 50 MG tablet  Commonly known as:  TENORMIN     azithromycin 250 MG tablet  Commonly known as:  ZITHROMAX      TAKE these medications       aspirin 81 MG tablet  Take 81 mg by mouth daily.     brimonidine 0.1 % Soln  Commonly known as:  ALPHAGAN P  Place 1 drop into both eyes 2 (two) times daily.     BRINTELLIX 10 MG Tabs  Generic drug:  Vortioxetine HBr  Take 10 mg by mouth daily.     dorzolamide 2 % ophthalmic solution  Commonly known as:  TRUSOPT  Place 1 drop into both eyes 2 (two) times daily.     Fish Oil 1000 MG Caps  Take 1,000 mg by mouth every evening.     furosemide 40 MG tablet  Commonly known as:  LASIX  Take 1 tablet (40 mg total) by mouth 2 (two) times daily.     gabapentin 800 MG tablet  Commonly known as:  NEURONTIN  Take 800mg  by mouth twice daily     glipiZIDE 10 MG tablet  Commonly known as:  GLUCOTROL  Take 5-10 mg by mouth 2 (two) times daily as needed (only takes if needed, depending on glucose level >200).     latanoprost 0.005 % ophthalmic solution  Commonly known as:  XALATAN  Place 1 drop into the right eye at bedtime.     levothyroxine 50 MCG tablet  Commonly known as:  SYNTHROID  Take 1 tablet (50 mcg total) by mouth daily before breakfast.     losartan 100 MG tablet  Commonly known as:  COZAAR  Take 100 mg by mouth daily.     magnesium oxide 400 MG tablet  Commonly known as:  MAG-OX  Take 1 tablet (400 mg total) by mouth 2 (two) times daily.     metFORMIN 500 MG 24 hr tablet  Commonly known as:  GLUCOPHAGE-XR  Take 1,000 mg by mouth 2 (two) times daily.     minoxidil 10 MG tablet  Commonly  known as:  LONITEN  Take 10 mg by mouth daily.     multivitamin tablet  Take 1 tablet by mouth daily.     PROBIOTIC DAILY PO  Take 1 tablet by mouth daily.     rosuvastatin 5 MG tablet  Commonly known as:  CRESTOR  Take 5 mg by mouth daily.     traZODone 150 MG tablet  Commonly known as:  DESYREL  Take 150 mg by mouth at bedtime.     vitamin B-12 1000 MCG tablet  Commonly known as:  CYANOCOBALAMIN  Take 1,000 mcg by mouth daily.     VITAMIN D-3 PO  Take 5,000 Units by mouth every evening.        Discharge Assessment: Filed Vitals:   10/16/13 1420  BP: 106/57  Pulse: 80  Temp: 98 F (36.7 C)  Resp: 18   Skin clean, dry and intact with exception of stage 1 pressure ulcer on sacrum. IV catheter discontinued intact. Site without signs and symptoms of complications - no redness or  edema noted at insertion site, patient denies c/o pain - only slight tenderness at site.  Dressing with slight pressure applied.  D/c Instructions-Education: Discharge instructions given to patient/family with verbalized understanding. D/c education completed with patient/family including follow up instructions, medication list, d/c activities limitations if indicated, with other d/c instructions as indicated by MD - patient able to verbalize understanding, all questions fully answered. Patient instructed to return to ED, call 911, or call MD for any changes in condition.  Patient escorted via [place], and D/C home via private auto.  Alyssa [name], Alyssa Hefty, RN 10/16/2013 3:19 PM

## 2013-10-16 NOTE — Progress Notes (Signed)
Notified Schorr, NP that pt requesting trazodone that pt takes at night at home. Schorr, NP ordered Trazodone. Will continue to monitor pt. Ranelle Oyster, RN

## 2013-10-17 LAB — BODY FLUID CULTURE: Culture: NO GROWTH

## 2013-10-20 ENCOUNTER — Telehealth: Payer: Self-pay | Admitting: Cardiovascular Disease

## 2013-10-29 ENCOUNTER — Encounter: Payer: Self-pay | Admitting: Internal Medicine

## 2013-10-29 ENCOUNTER — Ambulatory Visit (INDEPENDENT_AMBULATORY_CARE_PROVIDER_SITE_OTHER): Payer: Medicare Other | Admitting: Internal Medicine

## 2013-10-29 VITALS — BP 136/84 | HR 84 | Temp 97.9°F | Resp 16 | Ht 63.25 in | Wt 226.4 lb

## 2013-10-29 DIAGNOSIS — Z79899 Other long term (current) drug therapy: Secondary | ICD-10-CM

## 2013-10-29 DIAGNOSIS — I5031 Acute diastolic (congestive) heart failure: Secondary | ICD-10-CM

## 2013-10-29 DIAGNOSIS — I10 Essential (primary) hypertension: Secondary | ICD-10-CM

## 2013-10-29 DIAGNOSIS — R0602 Shortness of breath: Secondary | ICD-10-CM

## 2013-10-29 DIAGNOSIS — E119 Type 2 diabetes mellitus without complications: Secondary | ICD-10-CM

## 2013-10-29 NOTE — Patient Instructions (Signed)
Heart Failure °Heart failure is a condition in which the heart has trouble pumping blood. This means your heart does not pump blood efficiently for your body to work well. In some cases of heart failure, fluid may back up into your lungs or you may have swelling (edema) in your lower legs. Heart failure is usually a long-term (chronic) condition. It is important for you to take good care of yourself and follow your caregiver's treatment plan. °CAUSES  °Some health conditions can cause heart failure. Those health conditions include: °· High blood pressure (hypertension) causes the heart muscle to work harder than normal. When pressure in the blood vessels is high, the heart needs to pump (contract) with more force in order to circulate blood throughout the body. High blood pressure eventually causes the heart to become stiff and weak. °· Coronary artery disease (CAD) is the buildup of cholesterol and fat (plaque) in the arteries of the heart. The blockage in the arteries deprives the heart muscle of oxygen and blood. This can cause chest pain and may lead to a heart attack. High blood pressure can also contribute to CAD. °· Heart attack (myocardial infarction) occurs when 1 or more arteries in the heart become blocked. The loss of oxygen damages the muscle tissue of the heart. When this happens, part of the heart muscle dies. The injured tissue does not contract as well and weakens the heart's ability to pump blood. °· Abnormal heart valves can cause heart failure when the heart valves do not open and close properly. This makes the heart muscle pump harder to keep the blood flowing. °· Heart muscle disease (cardiomyopathy or myocarditis) is damage to the heart muscle from a variety of causes. These can include drug or alcohol abuse, infections, or unknown reasons. These can increase the risk of heart failure. °· Lung disease makes the heart work harder because the lungs do not work properly. This can cause a strain  on the heart, leading it to fail. °· Diabetes increases the risk of heart failure. High blood sugar contributes to high fat (lipid) levels in the blood. Diabetes can also cause slow damage to tiny blood vessels that carry important nutrients to the heart muscle. When the heart does not get enough oxygen and food, it can cause the heart to become weak and stiff. This leads to a heart that does not contract efficiently. °· Other conditions can contribute to heart failure. These include abnormal heart rhythms, thyroid problems, and low blood counts (anemia). °Certain unhealthy behaviors can increase the risk of heart failure. Those unhealthy behaviors include: °· Being overweight. °· Smoking or chewing tobacco. °· Eating foods high in fat and cholesterol. °· Abusing illicit drugs or alcohol. °· Lacking physical activity. °SYMPTOMS  °Heart failure symptoms may vary and can be hard to detect. Symptoms may include: °· Shortness of breath with activity, such as climbing stairs. °· Persistent cough. °· Swelling of the feet, ankles, legs, or abdomen. °· Unexplained weight gain. °· Difficulty breathing when lying flat (orthopnea). °· Waking from sleep because of the need to sit up and get more air. °· Rapid heartbeat. °· Fatigue and loss of energy. °· Feeling lightheaded, dizzy, or close to fainting. °· Loss of appetite. °· Nausea. °· Increased urination during the night (nocturia). °DIAGNOSIS  °A diagnosis of heart failure is based on your history, symptoms, physical examination, and diagnostic tests. °Diagnostic tests for heart failure may include: °· Echocardiography. °· Electrocardiography. °· Chest X-ray. °· Blood tests. °· Exercise   stress test. °· Cardiac angiography. °· Radionuclide scans. °TREATMENT  °Treatment is aimed at managing the symptoms of heart failure. Medicines, behavioral changes, or surgical intervention may be necessary to treat heart failure. °· Medicines to help treat heart failure may  include: °· Angiotensin-converting enzyme (ACE) inhibitors. This type of medicine blocks the effects of a blood protein called angiotensin-converting enzyme. ACE inhibitors relax (dilate) the blood vessels and help lower blood pressure. °· Angiotensin receptor blockers. This type of medicine blocks the actions of a blood protein called angiotensin. Angiotensin receptor blockers dilate the blood vessels and help lower blood pressure. °· Water pills (diuretics). Diuretics cause the kidneys to remove salt and water from the blood. The extra fluid is removed through urination. This loss of extra fluid lowers the volume of blood the heart pumps. °· Beta blockers. These prevent the heart from beating too fast and improve heart muscle strength. °· Digitalis. This increases the force of the heartbeat. °· Healthy behavior changes include: °· Obtaining and maintaining a healthy weight. °· Stopping smoking or chewing tobacco. °· Eating heart healthy foods. °· Limiting or avoiding alcohol. °· Stopping illicit drug use. °· Physical activity as directed by your caregiver. °· Surgical treatment for heart failure may include: °· A procedure to open blocked arteries, repair damaged heart valves, or remove damaged heart muscle tissue. °· A pacemaker to improve heart muscle function and control certain abnormal heart rhythms. °· An internal cardioverter defibrillator to treat certain serious abnormal heart rhythms. °· A left ventricular assist device to assist the pumping ability of the heart. °HOME CARE INSTRUCTIONS  °· Take your medicine as directed by your caregiver. Medicines are important in reducing the workload of your heart, slowing the progression of heart failure, and improving your symptoms. °· Do not stop taking your medicine unless directed by your caregiver. °· Do not skip any dose of medicine. °· Refill your prescriptions before you run out of medicine. Your medicines are needed every day. °· Take over-the-counter  medicine only as directed by your caregiver or pharmacist. °· Engage in moderate physical activity if directed by your caregiver. Moderate physical activity can benefit some people. The elderly and people with severe heart failure should consult with a caregiver for physical activity recommendations. °· Eat heart healthy foods. Food choices should be free of trans fat and low in saturated fat, cholesterol, and salt (sodium). Healthy choices include fresh or frozen fruits and vegetables, fish, lean meats, legumes, fat-free or low-fat dairy products, and whole grain or high fiber foods. Talk to a dietitian to learn more about heart healthy foods. °· Limit sodium if directed by your caregiver. Sodium restriction may reduce symptoms of heart failure in some people. Talk to a dietitian to learn more about heart healthy seasonings. °· Use healthy cooking methods. Healthy cooking methods include roasting, grilling, broiling, baking, poaching, steaming, or stir-frying. Talk to a dietitian to learn more about healthy cooking methods. °· Limit fluids if directed by your caregiver. Fluid restriction may reduce symptoms of heart failure in some people. °· Weigh yourself every day. Daily weights are important in the early recognition of excess fluid. You should weigh yourself every morning after you urinate and before you eat breakfast. Wear the same amount of clothing each time you weigh yourself. Record your daily weight. Provide your caregiver with your weight record. °· Monitor and record your blood pressure if directed by your caregiver. °· Check your pulse if directed by your caregiver. °· Lose weight if directed   by your caregiver. Weight loss may reduce symptoms of heart failure in some people. °· Stop smoking or chewing tobacco. Nicotine makes your heart work harder by causing your blood vessels to constrict. Do not use nicotine gum or patches before talking to your caregiver. °· Schedule and attend follow-up visits as  directed by your caregiver. It is important to keep all your appointments. °· Limit alcohol intake to no more than 1 drink per day for nonpregnant women and 2 drinks per day for men. Drinking more than that is harmful to your heart. Tell your caregiver if you drink alcohol several times a week. Talk with your caregiver about whether alcohol is safe for you. If your heart has already been damaged by alcohol or you have severe heart failure, drinking alcohol should be stopped completely. °· Stop illicit drug use. °· Stay up-to-date with immunizations. It is especially important to prevent respiratory infections through current pneumococcal and influenza immunizations. °· Manage other health conditions such as hypertension, diabetes, thyroid disease, or abnormal heart rhythms as directed by your caregiver. °· Learn to manage stress. °· Plan rest periods when fatigued. °· Learn strategies to manage high temperatures. If the weather is extremely hot: °· Avoid vigorous physical activity. °· Use air conditioning or fans or seek a cooler location. °· Avoid caffeine and alcohol. °· Wear loose-fitting, lightweight, and light-colored clothing. °· Learn strategies to manage cold temperatures. If the weather is extremely cold: °· Avoid vigorous physical activity. °· Layer clothes. °· Wear mittens or gloves, a hat, and a scarf when going outside. °· Avoid alcohol. °· Obtain ongoing education and support as needed. °· Participate or seek rehabilitation as needed to maintain or improve independence and quality of life. °SEEK MEDICAL CARE IF:  °· Your weight increases by 03 lb/1.4 kg in 1 day or 05 lb/2.3 kg in a week. °· You have increasing shortness of breath that is unusual for you. °· You are unable to participate in your usual physical activities. °· You tire easily. °· You cough more than normal, especially with physical activity. °· You have any or more swelling in areas such as your hands, feet, ankles, or abdomen. °· You  are unable to sleep because it is hard to breathe. °· You feel like your heart is beating fast (palpitations). °· You become dizzy or lightheaded upon standing up. °SEEK IMMEDIATE MEDICAL CARE IF:  °· You have difficulty breathing. °· There is a change in mental status such as decreased alertness or difficulty with concentration. °· You have a pain or discomfort in your chest. °· You have an episode of fainting (syncope). °MAKE SURE YOU:  °· Understand these instructions. °· Will watch your condition. °· Will get help right away if you are not doing well or get worse. °Document Released: 07/09/2005 Document Revised: 11/03/2012 Document Reviewed: 07/31/2012 °[employer]® Patient Information ©2014 [employer]. ° °

## 2013-10-29 NOTE — Progress Notes (Signed)
Patient ID: Alyssa [name], female   DOB: January 12, 1946, 68 y.o.   MRN: [id number]    This very nice 68 y.o. MWF presents for hospital follow up with Hypertension, Hyperlipidemia, T2 NIDDM and Vitamin D Deficiency who was recently hospitalized 10/12/2013- 10/16/2013 w/ AMS changes attributed to hypoxic encephalopathy ultimately attributed to Acute diastolic CHF with patient intubated and ventilated overnight with CO2 > 80 and after diuresis w/Lasix and an 1100 cc Rt thoracentesis, patient improved and was extubated the next day. Cardiac enzymes were reported negative and Lexiscan was negative for ischemia. Since home patient admits some dyspnea with activity, but denies any exertional CP or discomfort or orthopnea or PND. Etiology of patient's Ht Failure is not clear. Patient does have upcoming Cardiology F/U at [facility] in a few days.     Today's BP: 136/84 mmHg . Patient denies any cardiac type chest pain, palpitations, dyspnea/orthopnea/PND, dizziness, claudication, or dependent edema.   Also, the patient has history of T2 NIDDM and admits poor dietary compliance. Patient denies any symptoms of reactive hypoglycemia, diabetic polys, paresthesias or visual blurring.   Medication Sig  . aspirin 81 MG tablet Take 81 mg by mouth daily.  . brimonidine (ALPHAGAN P) 0.1 % SOLN Place 1 drop into both eyes 2  times daily.   . Cholecalciferol (VITAMIN D-3 PO) Take 5,000 Units by mouth every evening.   . dorzolamide (TRUSOPT) 2 % ophthalmic solution Place 1 drop into both eyes 2  times daily.  . furosemide (LASIX) 40 MG tablet Take 1 tablet  2  times daily.  Marland Kitchen gabapentin (NEURONTIN) 800 MG tablet Take 800 mg by mouth twice daily  . glipiZIDE (GLUCOTROL) 10 MG tablet Take 5-10 mg by mouth  2 times daily as needed   glucose >200).   Marland Kitchen latanoprost (XALATAN) 0.005 % ophthalmic solution Place 1 drop into the right eye at bedtime.  Marland Kitchen levothyroxine (SYNTHROID) 50 MCG tablet Take 1 tablet by mouth daily before  breakfast.  . losartan (COZAAR) 100 MG tablet Take 100 mg by mouth daily.  . magnesium oxide (MAG-OX) 400 MG tablet Take 1 tablet 2  times daily.  . metFORMIN (GLUCOPHAGE-XR) 500 MG 24 hr tablet Take 1,000 mg by mouth 2  times daily.  . minoxidil (LONITEN) 10 MG tablet Take 10 mg by mouth daily.  . Multiple Vitamin (MULTIVITAMIN) tablet Take 1 tablet by mouth daily.  . Omega-3 Fatty Acids (FISH OIL) 1000 MG CAPS Take 1,000 mg by mouth every evening.   . Probiotic Product (PROBIOTIC DAILY PO) Take 1 tablet by mouth daily.   . rosuvastatin (CRESTOR) 5 MG tablet Take 5 mg by mouth daily.  . traZODone (DESYREL) 150 MG tablet Take 150 mg by mouth at bedtime.   . vitamin B-12 (CYANOCOBALAMIN) 1000 MCG tablet Take 1,000 mcg by mouth daily.  . Vortioxetine HBr (BRINTELLIX) 10 MG TABS Take 10 mg by mouth daily.      Allergies  Allergen Reactions  . Ciprofloxacin Hcl Diarrhea  . Cymbalta [Duloxetine Hcl] Other (See Comments)    Per pt: it did not work  . Codeine Other (See Comments)    Per pt: unknown  . Lortab [Hydrocodone-Acetaminophen] Other (See Comments)    Per pt: unknown  . Oxycodone Other (See Comments)    Per pt: unknown    PMHx:   Past Medical History  Diagnosis Date  . Osteopenia   . Osteoporosis   . Diabetes mellitus without complication   . Hypertension   . Bipolar  1 disorder   . Hyperlipidemia   . Anemia   . Cataract   . Unspecified vitamin D deficiency   . Glaucoma   . Chronic back pain   . Hypothyroidism     FHx:    Reviewed / unchanged  SHx:    Reviewed / unchanged   Systems Review: Constitutional: Denies fever, chills, wt changes, headaches, insomnia, fatigue, night sweats, change in appetite. Eyes: Denies redness, blurred vision, diplopia, discharge, itchy, watery eyes.  ENT: Denies discharge, congestion, post nasal drip, epistaxis, sore throat, earache, hearing loss, dental pain, tinnitus, vertigo, sinus pain, snoring.  CV: Denies chest pain,  palpitations, irregular heartbeat, syncope, diaphoresis, orthopnea, PND, claudication, edema. Respiratory: (+) DOE. Dry cough / no sputum.  Gastrointestinal: Denies dysphagia, odynophagia, heartburn, reflux, water brash, abdominal pain or cramps, nausea, vomiting, bloating, diarrhea, constipation, hematemesis, melena, hematochezia,  or hemorrhoids. Genitourinary: Denies dysuria, frequency, urgency, nocturia, hesitancy, discharge, hematuria, flank pain. Musculoskeletal: Denies arthralgias, myalgias, stiffness, jt. swelling, pain, limp, strain/sprain.  Skin: Denies pruritus, rash, hives, warts, acne, eczema, change in skin lesion(s). Neuro: No weakness, tremor, incoordination, spasms, paresthesia, or pain. Psychiatric: Denies confusion, memory loss, or sensory loss. Endo: Denies change in weight, skin, hair change.  Heme/Lymph: No excessive bleeding, bruising, orenlarged lymph nodes.   Exam:    BP 136/84  Pulse 84  Temp 97.9 F   Resp 16  Ht 5' 3.25"   Wt 226 lb 6.4 oz   BMI 39.77 kg/m2  Appears over nourished  And in no distress. Eyes: PERRLA, EOMs, conjunctiva no swelling or erythema. ENT/Mouth: EAC's clear, TM's nl w/o erythema, bulging. Nares clear w/o erythema, swelling, exudates. Oropharynx clear without erythema or exudates. Oral hygiene is good. Tongue normal, non obstructing. Hearing intact.  Neck: Supple. Thyroid nl. Car 2+/2+ without bruits, nodes or JVD. Chest: Respirations nl with BS clear & equal w/o rales, rhonchi, wheezing or stridor.  Cor: Heart sounds normal w/ regular rate and rhythm without sig. murmurs, gallops, clicks, or rubs. Peripheral pulses normal and equal  With 1 (+) pretibial edema.  Abdomen: Soft & bowel sounds normal. Non-tender w/o guarding, rebound, hernias, masses, or organomegaly.  Musculoskeletal: Full ROM all peripheral extremities, joint stability, 5/5 strength, and normal gait.  Skin: Warm, dry without exposed rashes, lesions, ecchymosis apparent.   Neuro: Cranial nerves intact, reflexes equal bilaterally. Sensory-motor testing grossly intact. Tendon reflexes grossly intact.  Pysch: Alert & oriented x 3. Insight and judgement very limited insight to the etiology of her obesity contributing to her health issues.   Baseline O2 sat was 90-92-94 % @ rest and promptly dropped to 85 % with walking about 30 feet.  Assessment and Plan:  1. Recent Acute Diastolic Ht Failure  2. T2 NIDDM - long discussion with Patient & her daughter Alyssa [name] - Re: better eating habits for weight loss and patient became very defensive and angry  3. Morbid Obesity - (BMI 40)  4. Hypertension - Continue monitor blood pressure at home. Continue diet/meds same.  5. Hyperlipidemia - Continue diet/meds, exercise,& lifestyle modifications. Continue monitor periodic cholesterol/liver & renal functions   6. Vitamin D Deficiency - Continue supplementation  7. Hypoxia - with limited reserves - Recommended Home O2 and patient refused - Patient's daughter was advised to discuss further with her mother and call back if she is agreeable to home O2.  Also recommended a Pulmonary consultation to better delineate her limited reserve as to whether her Morbid obesity is a major contributory factor. Rechecking labs  today to establish a new post hospital baseline and asked patient to return in 10 days to confirm stability. In her present state of defensive mind she is not agreeable to anything.   Recommended regular exercise, BP monitoring, weight control, and discussed med and SE's. Recommended labs to assess and monitor clinical status. Further disposition pending results of labs.

## 2013-10-30 ENCOUNTER — Ambulatory Visit: Payer: Medicare Other | Admitting: Internal Medicine

## 2013-10-30 LAB — CBC WITH DIFFERENTIAL/PLATELET
Basophils Absolute: 0 10*3/uL (ref 0.0–0.1)
Basophils Relative: 0 % (ref 0–1)
EOS ABS: 0.2 10*3/uL (ref 0.0–0.7)
Eosinophils Relative: 3 % (ref 0–5)
HEMATOCRIT: 33.5 % — AB (ref 36.0–46.0)
HEMOGLOBIN: 10.4 g/dL — AB (ref 12.0–15.0)
Lymphocytes Relative: 29 % (ref 12–46)
Lymphs Abs: 2.1 10*3/uL (ref 0.7–4.0)
MCH: 27.6 pg (ref 26.0–34.0)
MCHC: 31 g/dL (ref 30.0–36.0)
MCV: 88.9 fL (ref 78.0–100.0)
MONO ABS: 0.4 10*3/uL (ref 0.1–1.0)
MONOS PCT: 5 % (ref 3–12)
NEUTROS PCT: 63 % (ref 43–77)
Neutro Abs: 4.6 10*3/uL (ref 1.7–7.7)
Platelets: 322 10*3/uL (ref 150–400)
RBC: 3.77 MIL/uL — AB (ref 3.87–5.11)
RDW: 15.3 % (ref 11.5–15.5)
WBC: 7.3 10*3/uL (ref 4.0–10.5)

## 2013-10-30 LAB — HEMOGLOBIN A1C
Hgb A1c MFr Bld: 7.2 % — ABNORMAL HIGH (ref <5.7)
Mean Plasma Glucose: 160 mg/dL — ABNORMAL HIGH (ref <117)

## 2013-10-30 LAB — BASIC METABOLIC PANEL WITH GFR
BUN: 29 mg/dL — ABNORMAL HIGH (ref 6–23)
CALCIUM: 10.3 mg/dL (ref 8.4–10.5)
CO2: 37 meq/L — AB (ref 19–32)
Chloride: 99 mEq/L (ref 96–112)
Creat: 1.44 mg/dL — ABNORMAL HIGH (ref 0.50–1.10)
GFR, Est African American: 43 mL/min — ABNORMAL LOW
GFR, Est Non African American: 37 mL/min — ABNORMAL LOW
GLUCOSE: 108 mg/dL — AB (ref 70–99)
Potassium: 4.4 mEq/L (ref 3.5–5.3)
SODIUM: 143 meq/L (ref 135–145)

## 2013-10-30 LAB — HEPATIC FUNCTION PANEL
ALT: 12 U/L (ref 0–35)
AST: 19 U/L (ref 0–37)
Albumin: 3.8 g/dL (ref 3.5–5.2)
Alkaline Phosphatase: 54 U/L (ref 39–117)
Bilirubin, Direct: 0.1 mg/dL (ref 0.0–0.3)
Indirect Bilirubin: 0.2 mg/dL (ref 0.2–1.2)
Total Bilirubin: 0.3 mg/dL (ref 0.2–1.2)
Total Protein: 6.3 g/dL (ref 6.0–8.3)

## 2013-10-30 LAB — INSULIN, FASTING: Insulin fasting, serum: 4 u[IU]/mL (ref 3–28)

## 2013-10-30 LAB — MAGNESIUM: MAGNESIUM: 2 mg/dL (ref 1.5–2.5)

## 2013-10-30 LAB — TSH: TSH: 1.89 u[IU]/mL (ref 0.350–4.500)

## 2013-10-30 LAB — BRAIN NATRIURETIC PEPTIDE: BRAIN NATRIURETIC PEPTIDE: 36.6 pg/mL (ref 0.0–100.0)

## 2013-11-04 ENCOUNTER — Encounter: Payer: Self-pay | Admitting: Cardiology

## 2013-11-04 ENCOUNTER — Ambulatory Visit (INDEPENDENT_AMBULATORY_CARE_PROVIDER_SITE_OTHER): Payer: Medicare Other | Admitting: Cardiology

## 2013-11-04 VITALS — BP 100/60 | HR 80 | Ht 63.0 in | Wt 222.0 lb

## 2013-11-04 DIAGNOSIS — I5031 Acute diastolic (congestive) heart failure: Secondary | ICD-10-CM

## 2013-11-04 DIAGNOSIS — I10 Essential (primary) hypertension: Secondary | ICD-10-CM

## 2013-11-04 DIAGNOSIS — N183 Chronic kidney disease, stage 3 unspecified: Secondary | ICD-10-CM

## 2013-11-04 DIAGNOSIS — I509 Heart failure, unspecified: Secondary | ICD-10-CM

## 2013-11-04 NOTE — Assessment & Plan Note (Signed)
Hospitalized 3/23-3/27/15 with acute diastolic respiratory failure requiring intubation

## 2013-11-04 NOTE — Progress Notes (Signed)
11/04/2013 Alyssa [name]   January 18, 1946  [id number]  Primary Physicia [name],Alyssa DAVID, MD Primary Cardiologist: Dr Claiborne Billings  HPI:  This is a 68 y.o. female with a past medical history significant for morbid obesity, NIDDM, HTN, and dyslipidemia. She has been followed by Dr Melford Aase. She denies any prior history of CAD, MI, or CHF. She thinks she has had a previous stress test but I could find no records of that. She was admitted 10/12/13 with acute respiratory failure requiring intubation. This was presumed to be from acute diastolic CHF. Echo showed an EF of 60-65% with grade 1 diastolic dysfunction and PA pressure of 56 mmHg. She has diuresed  and had a Rt pleural effusion that was tapped- 1100cc. We are asked to see secondary to an abnormal EKG showing new septal TWI. Myoview was low risk. Dr Claiborne Billings felt she probably had sleep apnea.             She is in the office today for follow up. She has been doing better but admits she is "depressed". She seems a little overwhelmed about her recent illness. I discussed diastolic heart failure with her and explained the interaction with DM, HTN, obesity, and sleep apnea. Her wgt at discharge was 226, wgt today 222. She still has some LE edema but overall feels less SOB and wants to go back to pool exercises which I encouraged her to do.     Current Outpatient Prescriptions  Medication Sig Dispense Refill  . aspirin 81 MG tablet Take 81 mg by mouth daily.      . brimonidine (ALPHAGAN P) 0.1 % SOLN Place 1 drop into both eyes 2 (two) times daily.       . Cholecalciferol (VITAMIN D-3 PO) Take 5,000 Units by mouth every evening.       . dorzolamide (TRUSOPT) 2 % ophthalmic solution Place 1 drop into both eyes 2 (two) times daily.      . dorzolamide-timolol (COSOPT) 22.3-6.8 MG/ML ophthalmic solution       . furosemide (LASIX) 40 MG tablet Take 1 tablet (40 mg total) by mouth 2 (two) times daily.  60 tablet  0  . gabapentin (NEURONTIN) 800 MG tablet Take  800mg  by mouth twice daily      . glipiZIDE (GLUCOTROL) 10 MG tablet Take 5-10 mg by mouth 2 (two) times daily as needed (only takes if needed, depending on glucose level >200).       Marland Kitchen latanoprost (XALATAN) 0.005 % ophthalmic solution Place 1 drop into the right eye at bedtime.      Marland Kitchen levothyroxine (SYNTHROID) 50 MCG tablet Take 1 tablet (50 mcg total) by mouth daily before breakfast.  90 tablet  99  . losartan (COZAAR) 100 MG tablet Take 100 mg by mouth daily.      . magnesium oxide (MAG-OX) 400 MG tablet Take 1 tablet (400 mg total) by mouth 2 (two) times daily.  180 tablet  1  . metFORMIN (GLUCOPHAGE-XR) 500 MG 24 hr tablet Take 1,000 mg by mouth 2 (two) times daily.      . minoxidil (LONITEN) 10 MG tablet Take 10 mg by mouth daily.      . Multiple Vitamin (MULTIVITAMIN) tablet Take 1 tablet by mouth daily.      . Omega-3 Fatty Acids (FISH OIL) 1000 MG CAPS Take 1,000 mg by mouth every evening.       . Probiotic Product (PROBIOTIC DAILY PO) Take 1 tablet by mouth daily.       Marland Kitchen  rosuvastatin (CRESTOR) 5 MG tablet Take 5 mg by mouth daily.      . traZODone (DESYREL) 150 MG tablet Take 150 mg by mouth at bedtime.       . vitamin B-12 (CYANOCOBALAMIN) 1000 MCG tablet Take 1,000 mcg by mouth daily.      . Vortioxetine HBr (BRINTELLIX) 10 MG TABS Take 10 mg by mouth daily.       No current facility-administered medications for this visit.    Allergies  Allergen Reactions  . Ciprofloxacin Hcl Diarrhea  . Cymbalta [Duloxetine Hcl] Other (See Comments)    Per pt: it did not work  . Codeine Other (See Comments)    Per pt: unknown  . Lortab [Hydrocodone-Acetaminophen] Other (See Comments)    Per pt: unknown  . Oxycodone Other (See Comments)    Per pt: unknown    History   Social History  . Marital Status: Married    Spouse Name: N/A    Number of Children: N/A  . Years of Education: N/A   Occupational History  . Not on file.   Social History Main Topics  . Smoking status: Never  Smoker   . Smokeless tobacco: Never Used  . Alcohol Use: No  . Drug Use: No  . Sexual Activity: No   Other Topics Concern  . Not on file   Social History Narrative  . No narrative on file     Review of Systems: General: negative for chills, fever, night sweats or weight changes.  Cardiovascular: negative for orthopnea, palpitations, paroxysmal nocturnal dyspnea  Dermatological: negative for rash Respiratory: negative for cough or wheezing Urologic: negative for hematuria Abdominal: negative for nausea, vomiting, diarrhea, bright red blood per rectum, melena, or hematemesis Neurologic: negative for visual changes, syncope, or dizziness All other systems reviewed and are otherwise negative except as noted above.    Blood pressure 100/60, pulse 80, height 5\' 3"  (1.6 m), weight 222 lb (100.699 kg).  General appearance: alert, cooperative, no distress and morbidly obese Neck: no carotid bruit and no JVD Lungs: clear to auscultation bilaterally and decreased breath sounds Rt base Heart: regular rate and rhythm Extremities: 1-2+ edema on Rt LE, 1+ on Lt    ASSESSMENT AND PLAN:   Acute diastolic congestive heart failure Hospitalized 3/23-3/27/15 with acute diastolic respiratory failure requiring intubation  Hypertension Controlled  Obesity, morbid Suspected sleep apnea  Chronic renal insufficiency, stage III (moderate) F/U BMP 2 weeks   PLAN  Same Rx for now but we will probably need to decrease her Lasix if her wgt get to 210 or there is a significant increase in her SCr. Check BMP 2 weeks, I ordered a sleep study. She should see Dr Claiborne Billings after this.   Doreene Burke [facility] 11/04/2013 1:02 PM

## 2013-11-04 NOTE — Assessment & Plan Note (Signed)
Controlled.  

## 2013-11-04 NOTE — Patient Instructions (Signed)
Your physician recommends that you return for lab work in: 2 weeks See Dr Claiborne Billings after your sleep study

## 2013-11-04 NOTE — Assessment & Plan Note (Signed)
Suspected sleep apnea

## 2013-11-04 NOTE — Assessment & Plan Note (Signed)
F/U BMP 2 weeks

## 2013-11-12 NOTE — Telephone Encounter (Signed)
Closed encounter °

## 2013-11-19 ENCOUNTER — Ambulatory Visit: Payer: Self-pay | Admitting: Internal Medicine

## 2013-11-19 ENCOUNTER — Ambulatory Visit: Payer: Self-pay | Admitting: Emergency Medicine

## 2013-11-23 ENCOUNTER — Other Ambulatory Visit: Payer: Self-pay | Admitting: Emergency Medicine

## 2013-11-27 ENCOUNTER — Telehealth: Payer: Self-pay | Admitting: *Deleted

## 2013-11-27 MED ORDER — FUROSEMIDE 40 MG PO TABS: 40.0000 mg | ORAL_TABLET | Freq: Two times a day (BID) | ORAL | Status: DC

## 2013-11-27 NOTE — Telephone Encounter (Signed)
GENERIC LASIX 40MG  REFILL

## 2013-12-16 ENCOUNTER — Ambulatory Visit: Payer: Self-pay | Admitting: Emergency Medicine

## 2013-12-28 ENCOUNTER — Encounter: Payer: Self-pay | Admitting: Emergency Medicine

## 2013-12-28 ENCOUNTER — Ambulatory Visit (INDEPENDENT_AMBULATORY_CARE_PROVIDER_SITE_OTHER): Payer: Medicare Other | Admitting: Emergency Medicine

## 2013-12-28 VITALS — BP 144/84 | HR 82 | Temp 98.4°F | Resp 16 | Ht 63.25 in | Wt 214.0 lb

## 2013-12-28 DIAGNOSIS — D649 Anemia, unspecified: Secondary | ICD-10-CM

## 2013-12-28 DIAGNOSIS — R32 Unspecified urinary incontinence: Secondary | ICD-10-CM

## 2013-12-28 DIAGNOSIS — E538 Deficiency of other specified B group vitamins: Secondary | ICD-10-CM

## 2013-12-28 DIAGNOSIS — R5383 Other fatigue: Secondary | ICD-10-CM

## 2013-12-28 DIAGNOSIS — R6889 Other general symptoms and signs: Secondary | ICD-10-CM

## 2013-12-28 DIAGNOSIS — R5381 Other malaise: Secondary | ICD-10-CM

## 2013-12-28 DIAGNOSIS — E039 Hypothyroidism, unspecified: Secondary | ICD-10-CM

## 2013-12-28 DIAGNOSIS — I10 Essential (primary) hypertension: Secondary | ICD-10-CM

## 2013-12-28 LAB — CBC WITH DIFFERENTIAL/PLATELET
BASOS PCT: 1 % (ref 0–1)
Basophils Absolute: 0.1 10*3/uL (ref 0.0–0.1)
EOS ABS: 0.1 10*3/uL (ref 0.0–0.7)
Eosinophils Relative: 2 % (ref 0–5)
HEMATOCRIT: 34.9 % — AB (ref 36.0–46.0)
Hemoglobin: 11.5 g/dL — ABNORMAL LOW (ref 12.0–15.0)
Lymphocytes Relative: 37 % (ref 12–46)
Lymphs Abs: 2.2 10*3/uL (ref 0.7–4.0)
MCH: 27.4 pg (ref 26.0–34.0)
MCHC: 33 g/dL (ref 30.0–36.0)
MCV: 83.3 fL (ref 78.0–100.0)
MONO ABS: 0.2 10*3/uL (ref 0.1–1.0)
MONOS PCT: 4 % (ref 3–12)
Neutro Abs: 3.3 10*3/uL (ref 1.7–7.7)
Neutrophils Relative %: 56 % (ref 43–77)
Platelets: 246 10*3/uL (ref 150–400)
RBC: 4.19 MIL/uL (ref 3.87–5.11)
RDW: 15.2 % (ref 11.5–15.5)
WBC: 5.9 10*3/uL (ref 4.0–10.5)

## 2013-12-28 MED ORDER — ROSUVASTATIN CALCIUM 5 MG PO TABS: 5.0000 mg | ORAL_TABLET | Freq: Every day | ORAL | Status: DC

## 2013-12-28 MED ORDER — VORTIOXETINE HBR 10 MG PO TABS: 10.0000 mg | ORAL_TABLET | Freq: Every day | ORAL | Status: DC

## 2013-12-28 NOTE — Progress Notes (Signed)
Subjective:    Patient ID: Alyssa [name], female    DOB: [birth year], 68 y.o.   MRN: [id number]  HPI Comments: 68 yo WF with close f/u for abnormal labs. She notes BP was too low with minoxidil 1 tab qd so she has started to do 1 qod and 1/2 qod which seems to keep BP stable. She notes LE edema has improved but still present. She is trying to lose weight and become more active. She feels much more stable mentally on Brintellix and is trying not to skip meals. She is still with fatigue. She notes hair is improving but nails are still breaking. WBC             7.3   10/29/2013 HGB            10.4   10/29/2013 HCT            33.5   10/29/2013 PLT             322   10/29/2013 GLUCOSE         108   10/29/2013 CHOL            160   10/01/2013 TRIG             68   10/13/2013 HDL              48   10/01/2013 LDLCALC          78   10/01/2013 ALT              12   10/29/2013 AST              19   10/29/2013 NA              143   10/29/2013 K               4.4   10/29/2013 CL               99   10/29/2013 CREATININE     1.44   10/29/2013 BUN              29   10/29/2013 CO2              37   10/29/2013 TSH           1.890   10/29/2013 INR            1.05   10/12/2013 HGBA1C          7.2   10/29/2013 MICROALBUR   117.05   06/24/2013   Hyperlipidemia  Hypertension      Review of Systems  Constitutional: Positive for fatigue.  Cardiovascular: Positive for leg swelling.  All other systems reviewed and are negative.  BP 144/84  Pulse 82  Temp(Src) 98.4 F (36.9 C) (Temporal)  Resp 16  Ht 5' 3.25" (1.607 m)  Wt 214 lb (97.07 kg)  BMI 37.59 kg/m2     Objective:   Physical Exam  Nursing note and vitals reviewed. Constitutional: She is oriented to person, place, and time. She appears well-developed and well-nourished. No distress.  HENT:  Head: Normocephalic and atraumatic.  Right Ear: External ear normal.  Left Ear: External ear normal.  Nose: Nose normal.  Mouth/Throat: Oropharynx is clear and moist.   Eyes: Conjunctivae and EOM are normal.  Neck: Normal range of motion. Neck supple. No JVD present. No thyromegaly present.  Cardiovascular: Normal rate, regular rhythm, normal heart sounds  and intact distal pulses.   1 + nonpitting bilateral LE edema  Pulmonary/Chest: Effort normal and breath sounds normal.  Abdominal: Soft. Bowel sounds are normal. She exhibits no distension and no mass. There is no tenderness. There is no rebound and no guarding.  Musculoskeletal: Normal range of motion. She exhibits no edema and no tenderness.  Lymphadenopathy:    She has no cervical adenopathy.  Neurological: She is alert and oriented to person, place, and time. No cranial nerve deficit.  Skin: Skin is warm and dry. No rash noted. No erythema. No pallor.  Nails are thin  Psychiatric: She has a normal mood and affect. Her behavior is normal. Judgment and thought content normal.          Assessment & Plan:  1. HTN- Check BP call if >130/80, increase cardio, continue Minoxidil alternating dose AD, call with results  2. Fatigue vs anemia vs thyroid- check labs, increase activity and H2O   3. Recheck abnormal labs with recent hospitalization and recurrent LE edema- Elevate legs/ compression socks, decease sodium call with any concerns

## 2013-12-29 LAB — IRON AND TIBC
%SAT: 19 % — ABNORMAL LOW (ref 20–55)
IRON: 64 ug/dL (ref 42–145)
TIBC: 339 ug/dL (ref 250–470)
UIBC: 275 ug/dL (ref 125–400)

## 2013-12-29 LAB — FOLATE RBC: RBC Folate: 548 ng/mL (ref >280)

## 2013-12-29 LAB — URINALYSIS, ROUTINE W REFLEX MICROSCOPIC
Bilirubin Urine: NEGATIVE
Glucose, UA: NEGATIVE mg/dL
HGB URINE DIPSTICK: NEGATIVE
Ketones, ur: NEGATIVE mg/dL
Nitrite: NEGATIVE
PH: 6.5 (ref 5.0–8.0)
Protein, ur: 100 mg/dL — AB
Specific Gravity, Urine: 1.012 (ref 1.005–1.030)
Urobilinogen, UA: 0.2 mg/dL (ref 0.0–1.0)

## 2013-12-29 LAB — URINALYSIS, MICROSCOPIC ONLY
Bacteria, UA: NONE SEEN
CASTS: NONE SEEN
CRYSTALS: NONE SEEN
Squamous Epithelial / LPF: NONE SEEN

## 2013-12-29 LAB — BASIC METABOLIC PANEL WITH GFR
BUN: 34 mg/dL — AB (ref 6–23)
CHLORIDE: 99 meq/L (ref 96–112)
CO2: 34 meq/L — AB (ref 19–32)
Calcium: 10.3 mg/dL (ref 8.4–10.5)
Creat: 1.42 mg/dL — ABNORMAL HIGH (ref 0.50–1.10)
GFR, Est African American: 43 mL/min — ABNORMAL LOW
GFR, Est Non African American: 38 mL/min — ABNORMAL LOW
GLUCOSE: 133 mg/dL — AB (ref 70–99)
Potassium: 3.8 mEq/L (ref 3.5–5.3)
Sodium: 141 mEq/L (ref 135–145)

## 2013-12-29 LAB — URINE CULTURE
Colony Count: NO GROWTH
Organism ID, Bacteria: NO GROWTH

## 2013-12-29 LAB — HEPATIC FUNCTION PANEL
ALT: 9 U/L (ref 0–35)
AST: 17 U/L (ref 0–37)
Albumin: 4 g/dL (ref 3.5–5.2)
Alkaline Phosphatase: 48 U/L (ref 39–117)
BILIRUBIN DIRECT: 0.1 mg/dL (ref 0.0–0.3)
BILIRUBIN INDIRECT: 0.5 mg/dL (ref 0.2–1.2)
Total Bilirubin: 0.6 mg/dL (ref 0.2–1.2)
Total Protein: 6.5 g/dL (ref 6.0–8.3)

## 2013-12-29 LAB — VITAMIN B12

## 2013-12-29 LAB — MICROALBUMIN / CREATININE URINE RATIO
Creatinine, Urine: 120.1 mg/dL
MICROALB/CREAT RATIO: 900 mg/g — AB (ref 0.0–30.0)
Microalb, Ur: 108.09 mg/dL — ABNORMAL HIGH (ref 0.00–1.89)

## 2013-12-29 LAB — TSH: TSH: 0.853 u[IU]/mL (ref 0.350–4.500)

## 2013-12-31 ENCOUNTER — Ambulatory Visit: Payer: Self-pay | Admitting: Emergency Medicine

## 2014-01-06 ENCOUNTER — Encounter (HOSPITAL_COMMUNITY): Payer: Self-pay | Admitting: Emergency Medicine

## 2014-01-06 DIAGNOSIS — E119 Type 2 diabetes mellitus without complications: Secondary | ICD-10-CM | POA: Insufficient documentation

## 2014-01-06 DIAGNOSIS — E039 Hypothyroidism, unspecified: Secondary | ICD-10-CM | POA: Insufficient documentation

## 2014-01-06 DIAGNOSIS — R259 Unspecified abnormal involuntary movements: Secondary | ICD-10-CM | POA: Insufficient documentation

## 2014-01-06 DIAGNOSIS — Z8739 Personal history of other diseases of the musculoskeletal system and connective tissue: Secondary | ICD-10-CM | POA: Insufficient documentation

## 2014-01-06 DIAGNOSIS — E785 Hyperlipidemia, unspecified: Secondary | ICD-10-CM | POA: Insufficient documentation

## 2014-01-06 DIAGNOSIS — Z7982 Long term (current) use of aspirin: Secondary | ICD-10-CM | POA: Insufficient documentation

## 2014-01-06 DIAGNOSIS — F29 Unspecified psychosis not due to a substance or known physiological condition: Secondary | ICD-10-CM | POA: Insufficient documentation

## 2014-01-06 DIAGNOSIS — I10 Essential (primary) hypertension: Secondary | ICD-10-CM | POA: Insufficient documentation

## 2014-01-06 DIAGNOSIS — Z79899 Other long term (current) drug therapy: Secondary | ICD-10-CM | POA: Insufficient documentation

## 2014-01-06 DIAGNOSIS — F319 Bipolar disorder, unspecified: Secondary | ICD-10-CM | POA: Insufficient documentation

## 2014-01-06 DIAGNOSIS — Z862 Personal history of diseases of the blood and blood-forming organs and certain disorders involving the immune mechanism: Secondary | ICD-10-CM | POA: Insufficient documentation

## 2014-01-06 DIAGNOSIS — N179 Acute kidney failure, unspecified: Secondary | ICD-10-CM | POA: Insufficient documentation

## 2014-01-06 DIAGNOSIS — Z8669 Personal history of other diseases of the nervous system and sense organs: Secondary | ICD-10-CM | POA: Insufficient documentation

## 2014-01-06 DIAGNOSIS — G8929 Other chronic pain: Secondary | ICD-10-CM | POA: Insufficient documentation

## 2014-01-06 NOTE — ED Notes (Signed)
Pt. was seen at [facility] ER today reports intermittent tremors onset yesterday , CT scan /blood tests done , pt. requesting MRI and neurology consult. Alert and oriented , speech clear / no facial asymmetry , equal grips with no arm drift. Results of blood tests / CT scan with pt. in CD.

## 2014-01-07 ENCOUNTER — Emergency Department (HOSPITAL_COMMUNITY)
Admission: EM | Admit: 2014-01-07 | Discharge: 2014-01-07 | Disposition: A | Discharge status: Home / Self Care | Payer: Medicare Other | Attending: Emergency Medicine | Admitting: Emergency Medicine

## 2014-01-07 DIAGNOSIS — N179 Acute kidney failure, unspecified: Secondary | ICD-10-CM

## 2014-01-07 DIAGNOSIS — R251 Tremor, unspecified: Secondary | ICD-10-CM

## 2014-01-07 MED ORDER — SODIUM CHLORIDE 0.9 % IV SOLN: Freq: Once | INTRAVENOUS | Status: DC

## 2014-01-07 NOTE — Discharge Instructions (Signed)
Kidney Disease, Adult The kidneys are two organs that lie on either side of the spine between the middle of the back and the front of the abdomen. The kidneys:   Remove wastes and extra water from the blood.   Produce important hormones. These regulate blood pressure, help keep bones strong, and help create red blood cells.   Balance the fluids and chemicals in the blood and tissues. Kidney disease occurs when the kidneys are damaged. Kidney damage may be sudden (acute) or develop over a long period (chronic). A small amount of damage may not cause problems, but a large amount of damage may make it difficult or impossible for the kidneys to work the way they should. Early detection and treatment of kidney disease may prevent kidney damage from becoming permanent or getting worse. Some kidney diseases are curable, but most are not. Many people with kidney disease are able to control the disease and live a normal life.  TYPES OF KIDNEY DISEASE  Acute kidney injury.Acute kidney injury occurs when there is sudden damage to the kidneys.  Chronic kidney disease. Chronic kidney disease occurs when the kidneys are damaged over a long period.  End-stage kidney disease. End-stage kidney disease occurs when the kidneys are so damaged that they stop working. In end-stage kidney disease, the kidneys cannot get better. CAUSES Any condition, disease, or event that damages the kidneys may cause kidney disease. Acute kidney injury.  A problem with blood flow to the kidneys. This may be caused by:   Blood loss.   Heart disease.   Severe burns.   Liver disease.  Direct damage to the kidneys. This may be caused by:  Some medicines.   A kidney infection.   Poisoning or consuming toxic substances.   A surgical wound.   A blow to the kidney area.   A problem with urine flow. This may be caused by:   Cancer.   Kidney stones.   An enlarged prostate. Chronic kidney disease. The  most common causes of chronic kidney disease are diabetes and high blood pressure (hypertension). Chronic kidney disease may also be caused by:   Diseases that cause the filtering units of the kidneys to become inflamed.   Diseases that affect the immune system.   Genetic diseases.   Medicines that damage the kidneys, such as anti-inflammatory medicines.  Poisoning or exposure to toxic substances.   A reoccurring kidney or urinary infection.   A problem with urine flow. This may be caused by:  Cancer.   Kidney stones.   An enlarged prostate in males. End-stage kidney disease. This kidney disease usually occurs when a chronic kidney disease gets worse. It may also occur after acute kidney injury.  SYMPTOMS   Swelling (edema) of the legs, ankles, or feet.   Tiredness (lethargy).   Nausea or vomiting.   Confusion.   Problems with urination, such as:   Painful or burning feeling during urination.   Decreased urine production.  Bloody urine.   Frequent urination, especially at night.  Hypertension.  Muscle twitches and cramps.   Shortness of breath.   Persistent itchiness.   Loss of appetite.  Metallic taste in the mouth.   Weakness.   Seizures.   Chest pain or pressure.   Trouble sleeping.   Headaches.   Abnormally dark or light skin.   Numbness in the hands or feet.   Easy bruising.   Frequent hiccups.   Menstruation stops. Sometimes, no symptoms are present. DIAGNOSIS  Kidney  disease may be detected and diagnosed by tests, including blood, urine, imaging, or kidney biopsy tests.  TREATMENT  Acute kidney injury. Treatment of acute kidney injury varies depending on the cause and severity of the kidney damage. In mild cases, no treatment may be needed. The kidneys may heal on their own. If acute kidney injury is more severe, your caregiver will treat the cause of the kidney damage, help the kidneys heal, and  prevent complications from occurring. Severe cases may require a procedure to remove toxic wastes from the body (dialysis) or surgery to repair kidney damage. Surgery may involve:   Repair of a torn kidney.   Removal of an obstruction.  Most of the time, you will need to stay overnight at the hospital.  Chronic kidney disease. Most chronic kidney diseases cannot be cured. Treatment usually involves relieving symptoms and preventing or slowing the progression of the disease. Treatment may include:   A special diet. You may need to avoid alcohol and foods that:   Have added salt.   Are high in potassium.   Are high in protein.   Medicines. These may:   Lower blood pressure.   Relieve anemia.   Relieve swelling.   Protect the bones.  End-stage kidney disease. End-stage kidney disease is life-threatening and must be treated immediately. There are two treatments for end-stage kidney disease:   Dialysis.   Receiving a new kidney (kidney transplant). Both of these treatments have serious risks and consequences. In addition to having dialysis or a kidney transplant, you may need to take medicines to control hypertension and cholesterol and to decrease phosphorus levels in your blood. LENGTH OF ILLNESS  Acute kidney injury.The length of this disease varies greatly from person to person. Exactly how long it lasts depends on the cause of the kidney damage. Acute kidney injury may develop into chronic kidney disease or end-stage kidney disease.  Chronic kidney disease. This disease usually lasts a lifetime. Chronic kidney disease may worsen over time to become end-stage kidney disease. The time it takes for end-stage kidney disease to develop varies from person to person.  End-stage kidney disease. This disease lasts until a kidney transplant is performed. PREVENTION  Kidney disease can sometimes be prevented. If you have diabetes, hypertension, or any other condition that may  lead to kidney disease, you should try to prevent kidney disease with:   An appropriate diet.  Medicine.  Lifestyle changes. FOR MORE INFORMATION  American Association of Kidney Patients: BombTimer.gl  National Kidney Foundation: www.kidney.[place]: https://mathis.com/  [facility]: www.lifeoptions.org and www.kidneyschool.org  Document Released: 07/09/2005 Document Revised: 06/25/2012 Document Reviewed: 03/07/2012 [facility] Patient Information 2015 [place], [place]. This information is not intended to replace advice given to you by your health care provider. Make sure you discuss any questions you have with your health care provider.  Tremor Tremor is a rhythmic, involuntary muscular contraction characterized by oscillations (to-and-fro movements) of a part of the body. The most common of all involuntary movements, tremor can affect various body parts such as the hands, head, facial structures, vocal cords, trunk, and legs; most tremors, however, occur in the hands. Tremor often accompanies neurological disorders associated with aging. Although the disorder is not life-threatening, it can be responsible for functional disability and social embarrassment. TREATMENT  There are many types of tremor and several ways in which tremor is classified. The most common classification is by behavioral context or position. There are five categories of tremor within this  classification: resting, postural, kinetic, task-specific, and psychogenic. Resting or static tremor occurs when the muscle is at rest, for example when the hands are lying on the lap. This type of tremor is often seen in patients with Parkinson's disease. Postural tremor occurs when a patient attempts to maintain posture, such as holding the hands outstretched. Postural tremors include physiological tremor, essential tremor, tremor with basal ganglia disease (also seen in patients with Parkinson's disease),  cerebellar postural tremor, tremor with peripheral neuropathy, post-traumatic tremor, and alcoholic tremor. Kinetic or intention (action) tremor occurs during purposeful movement, for example during finger-to-nose testing. Task-specific tremor appears when performing goal-oriented tasks such as handwriting, speaking, or standing. This group consists of primary writing tremor, vocal tremor, and orthostatic tremor. Psychogenic tremor occurs in both older and younger patients. The key feature of this tremor is that it dramatically lessens or disappears when the patient is distracted. PROGNOSIS There are some treatment options available for tremor; the appropriate treatment depends on accurate diagnosis of the cause. Some tremors respond to treatment of the underlying condition, for example in some cases of psychogenic tremor treating the patient's underlying mental problem may cause the tremor to disappear. Also, patients with tremor due to Parkinson's disease may be treated with Levodopa drug therapy. Symptomatic drug therapy is available for several other tremors as well. For those cases of tremor in which there is no effective drug treatment, physical measures such as teaching the patient to brace the affected limb during the tremor are sometimes useful. Surgical intervention such as thalamotomy or deep brain stimulation may be useful in certain cases. Document Released: 06/29/2002 Document Revised: 10/01/2011 Document Reviewed: 07/09/2005 [facility] Patient Information 2015 [place], [place]. This information is not intended to replace advice given to you by your health care provider. Make sure you discuss any questions you have with your health care provider.

## 2014-01-07 NOTE — ED Notes (Signed)
This RN spoke with pt and pt's husband, they do not wish to have an IV, blood drawn, normal saline fluid, or an EKG performed until they know if the pt needs to be admitted to the hospital or not. If she is going to be admitted then the pt will comply, until now pt refuses orders placed. Pt informed that this will delay admission to the hospital if she happens to be admitted, pt understands.

## 2014-01-07 NOTE — ED Notes (Signed)
Family upset at time of discharge because of overall day of events today, pt's had a long day. Pt's husband states we need more help, upset with amount of time waiting to see MD.

## 2014-01-07 NOTE — ED Provider Notes (Signed)
CSN: [id number]     Arrival date & time 01/06/14  2208 History   First MD Initiated Contact with Patient 01/07/14 0242     Chief Complaint  Patient presents with  . Tremors     (Consider location/radiation/quality/duration/timing/severity/associated sxs/prior Treatment) HPI Comments: Pt comes in with cc of tremors. Tremors started y'day, and resolved. Sx came again today, so they went to [facility] and a CT scan was normal - pt was asked to go to another hospital for MRI - and they decided to come to [facility] instead as they live here. Pt states that she has been having intermittent tremors today, and she has been having trouble gripping things. No numbness, tingling, weakness, slurred speech. Husband thinks patient was a bit confused on a couple of occasions. No recent infection.    The history is provided by the patient.    Past Medical History  Diagnosis Date  . Osteopenia   . Osteoporosis   . Diabetes mellitus without complication   . Hypertension   . Bipolar 1 disorder   . Hyperlipidemia   . Anemia   . Cataract   . Unspecified vitamin D deficiency   . Glaucoma   . Chronic back pain   . Hypothyroidism    Past Surgical History  Procedure Laterality Date  . Back surgery  2007  . Knee arthroscopy Bilateral   . Dilation and curettage of uterus    . Breast surgery Left     CYST REMOVAL  . Lumbar fusion  2009   Family History  Problem Relation Age of Onset  . Adopted: Yes   History  Substance Use Topics  . Smoking status: Never Smoker   . Smokeless tobacco: Never Used  . Alcohol Use: No   OB History   Grav Para Term Preterm Abortions TAB SAB Ect Mult Living                 Review of Systems  Constitutional: Positive for activity change.  Respiratory: Negative for shortness of breath.   Cardiovascular: Negative for chest pain.  Gastrointestinal: Negative for nausea, vomiting and abdominal pain.  Genitourinary: Negative for dysuria.  Musculoskeletal:  Negative for neck pain.  Neurological: Positive for tremors. Negative for dizziness, speech difficulty, weakness, numbness and headaches.  Psychiatric/Behavioral: Positive for confusion.      Allergies  Ciprofloxacin hcl; Cymbalta; Codeine; Lortab; and Oxycodone  Home Medications   Prior to Admission medications   Medication Sig Start Date End Date Taking? Authorizing Provider  aspirin 81 MG tablet Take 81 mg by mouth daily.   Yes Historical Provider, MD  brimonidine (ALPHAGAN P) 0.1 % SOLN Place 1 drop into both eyes 2 (two) times daily.   Yes Historical Provider, MD  Cholecalciferol (VITAMIN D-3 PO) Take 5,000 Units by mouth every evening.    Yes Historical Provider, MD  dorzolamide (TRUSOPT) 2 % ophthalmic solution Place 1 drop into both eyes 2 (two) times daily.   Yes Historical Provider, MD  dorzolamide-timolol (COSOPT) 22.3-6.8 MG/ML ophthalmic solution Place 1 drop into both eyes 2 (two) times daily.  09/22/13  Yes Historical Provider, MD  furosemide (LASIX) 40 MG tablet Take 1 tablet (40 mg total) by mouth 2 (two) times daily. 11/27/13  Yes Vicie Mutters, PA-C  gabapentin (NEURONTIN) 800 MG tablet Take 800 mg by mouth 4 (four) times daily.   Yes Historical Provider, MD  glipiZIDE (GLUCOTROL) 10 MG tablet Take 5-10 mg by mouth 2 (two) times daily as needed (only  takes if needed, depending on glucose level >200).    Yes Historical Provider, MD  latanoprost (XALATAN) 0.005 % ophthalmic solution Place 1 drop into the right eye at bedtime.   Yes Historical Provider, MD  levothyroxine (SYNTHROID) 50 MCG tablet Take 1 tablet (50 mcg total) by mouth daily before breakfast. 07/15/13  Yes Unk Pinto, MD  losartan (COZAAR) 100 MG tablet Take 100 mg by mouth 2 (two) times daily.    Yes Historical Provider, MD  magnesium oxide (MAG-OX) 400 MG tablet Take 1 tablet (400 mg total) by mouth 2 (two) times daily. 09/17/13  Yes Melissa R Smith, PA-C  metFORMIN (GLUMETZA) 500 MG (MOD) 24 hr tablet Take  1,000 mg by mouth 2 (two) times daily with a meal.   Yes Historical Provider, MD  minoxidil (LONITEN) 10 MG tablet Take 5-10 mg by mouth daily. Alternating between 5mg  and 10mg  everyday. 09/17/13  Yes Melissa R Smith, PA-C  Multiple Vitamin (MULTIVITAMIN) tablet Take 1 tablet by mouth daily.   Yes Historical Provider, MD  Omega-3 Fatty Acids (FISH OIL) 1000 MG CAPS Take 1,000 mg by mouth every evening.    Yes Historical Provider, MD  Probiotic Product (PROBIOTIC DAILY PO) Take 1 tablet by mouth every morning.    Yes Historical Provider, MD  rosuvastatin (CRESTOR) 5 MG tablet Take 1 tablet (5 mg total) by mouth daily. 12/28/13  Yes Melissa R Smith, PA-C  traZODone (DESYREL) 150 MG tablet Take 150 mg by mouth at bedtime.    Yes Historical Provider, MD  vitamin B-12 (CYANOCOBALAMIN) 1000 MCG tablet Take 1,000 mcg by mouth daily.   Yes Historical Provider, MD  Vortioxetine HBr (BRINTELLIX) 10 MG TABS Take 1 tablet (10 mg total) by mouth daily. 12/28/13  Yes Melissa R Smith, PA-C   BP 132/57  Pulse 70  Temp(Src) 98.5 F (36.9 C) (Oral)  Resp 15  SpO2 91% Physical Exam  Nursing note and vitals reviewed. Constitutional: She is oriented to person, place, and time. She appears well-developed and well-nourished.  HENT:  Head: Normocephalic and atraumatic.  Eyes: EOM are normal. Pupils are equal, round, and reactive to light.  Neck: Neck supple.  Cardiovascular: Normal rate, regular rhythm and normal heart sounds.   No murmur heard. Pulmonary/Chest: Effort normal. No respiratory distress.  Abdominal: Soft. She exhibits no distension. There is no tenderness. There is no rebound and no guarding.  Neurological: She is alert and oriented to person, place, and time. No cranial nerve deficit. Coordination normal.  No resting tremors or essential tremors.  Skin: Skin is warm and dry.    ED Course  Procedures (including critical care time) Labs Review Labs Reviewed - No data to display  Imaging  Review No results found.   EKG Interpretation None      MDM   Final diagnoses:  AKI (acute kidney injury)  Occasional tremors   Pt comes in w/ cc of tremors. Intermittent tremors started y'day. Was seen at outside hospital, and had CT head and labs done - noted to have AKI and some cortical atrophy - more pronounced than expected. She was advised to get MRI at another hospital, but rather then getting transferred, they decided to come to [facility] - [facility] local hospital.  There is no concerns for strokes or spinal cord compression/infection - and so we will not be getting emergent MRI. 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 Could be medication related as well. I advised to the patient and family that the wisest approach  would be formal Neurology evaluation rather than prematurely ordering tests - and they are in agreement with that.  I called [facility] this AM -  And requested them to set up an appt with the patient at their earliest convenience.  Pt advised to return if sx get worse.    Varney Biles, MD 01/07/14 [phone]

## 2014-01-07 NOTE — ED Notes (Signed)
MD at bedside. 

## 2014-01-08 ENCOUNTER — Encounter: Payer: Self-pay | Admitting: Neurology

## 2014-01-08 ENCOUNTER — Ambulatory Visit (INDEPENDENT_AMBULATORY_CARE_PROVIDER_SITE_OTHER): Payer: Medicare Other | Admitting: Neurology

## 2014-01-08 VITALS — BP 143/75 | HR 87 | Ht 63.0 in | Wt 227.0 lb

## 2014-01-08 DIAGNOSIS — I509 Heart failure, unspecified: Secondary | ICD-10-CM

## 2014-01-08 DIAGNOSIS — I5031 Acute diastolic (congestive) heart failure: Secondary | ICD-10-CM

## 2014-01-08 DIAGNOSIS — N179 Acute kidney failure, unspecified: Secondary | ICD-10-CM

## 2014-01-08 DIAGNOSIS — F319 Bipolar disorder, unspecified: Secondary | ICD-10-CM

## 2014-01-08 DIAGNOSIS — E785 Hyperlipidemia, unspecified: Secondary | ICD-10-CM

## 2014-01-08 DIAGNOSIS — G9340 Encephalopathy, unspecified: Secondary | ICD-10-CM

## 2014-01-08 DIAGNOSIS — E119 Type 2 diabetes mellitus without complications: Secondary | ICD-10-CM

## 2014-01-08 NOTE — Progress Notes (Signed)
PATIENT: Alyssa [name] DOB: 05/18/1946  HISTORICAL  Alyssa [name] is a 68 years old female, accompanied by her husband, and daughter, referred by emergency room, and primary care physician Dr. Melford Aase for evaluation of bilateral hands tremor.  She had a history of diabetes, hypertension, mood disorder, chronic low back pain, gait difficulty since 2013, has been using walker, history of lumbar degenerative disc disease, multiple lumbar decompression surgery in the past  In January 06 2014, she was cooking, under some stress, husband noticed that she was confused, bilateral hands shaking, she was taken to local emergency room, CAT scan of the brain showed no acute lesions, other than generalized atrophy, MRI was suggested Ever since the event, she continued to complain generalized weakness, fatigue, bilateral hands intermittent shaking, confusion  REVIEW OF SYSTEMS: Full 14 system review of systems performed and notable only for fatigue, blurry vision, sweating lax, spinning sensation, snoring, joint pain, achy muscles, skin sensitivity, memory loss, confusion, weakness, dizziness, tremor, snoring depression anxiety decreased energy  ALLERGIES: Allergies  Allergen Reactions  . Ciprofloxacin Hcl Diarrhea  . Cymbalta [Duloxetine Hcl] Other (See Comments)    Per pt: it did not work  . Codeine Other (See Comments)    Per pt: unknown  . Lortab [Hydrocodone-Acetaminophen] Other (See Comments)    Per pt: unknown  . Oxycodone Other (See Comments)    Per pt: unknown    HOME MEDICATIONS: Current Outpatient Prescriptions on File Prior to Visit  Medication Sig Dispense Refill  . aspirin 81 MG tablet Take 81 mg by mouth daily.      . brimonidine (ALPHAGAN P) 0.1 % SOLN Place 1 drop into both eyes 2 (two) times daily.      . Cholecalciferol (VITAMIN D-3 PO) Take 5,000 Units by mouth every evening.       . dorzolamide (TRUSOPT) 2 % ophthalmic solution Place 1 drop into both eyes 2 (two) times  daily.      . dorzolamide-timolol (COSOPT) 22.3-6.8 MG/ML ophthalmic solution Place 1 drop into both eyes 2 (two) times daily.       . furosemide (LASIX) 40 MG tablet Take 1 tablet (40 mg total) by mouth 2 (two) times daily.  60 tablet  2  . gabapentin (NEURONTIN) 800 MG tablet Take 800 mg by mouth 4 (four) times daily.      Marland Kitchen glipiZIDE (GLUCOTROL) 10 MG tablet Take 5-10 mg by mouth 2 (two) times daily as needed (only takes if needed, depending on glucose level >200).       Marland Kitchen latanoprost (XALATAN) 0.005 % ophthalmic solution Place 1 drop into the right eye at bedtime.      Marland Kitchen levothyroxine (SYNTHROID) 50 MCG tablet Take 1 tablet (50 mcg total) by mouth daily before breakfast.  90 tablet  99  . losartan (COZAAR) 100 MG tablet Take 100 mg by mouth 2 (two) times daily.       . magnesium oxide (MAG-OX) 400 MG tablet Take 1 tablet (400 mg total) by mouth 2 (two) times daily.  180 tablet  1  . metFORMIN (GLUMETZA) 500 MG (MOD) 24 hr tablet Take 1,000 mg by mouth 2 (two) times daily with a meal.      . minoxidil (LONITEN) 10 MG tablet Take 5-10 mg by mouth daily. Alternating between 5mg  and 10mg  everyday.      . Multiple Vitamin (MULTIVITAMIN) tablet Take 1 tablet by mouth daily.      . Omega-3 Fatty Acids (FISH OIL) 1000  MG CAPS Take 1,000 mg by mouth every evening.       . Probiotic Product (PROBIOTIC DAILY PO) Take 1 tablet by mouth every morning.       . rosuvastatin (CRESTOR) 5 MG tablet Take 1 tablet (5 mg total) by mouth daily.  28 tablet  0  . traZODone (DESYREL) 150 MG tablet Take 150 mg by mouth at bedtime.       . vitamin B-12 (CYANOCOBALAMIN) 1000 MCG tablet Take 1,000 mcg by mouth daily.      . Vortioxetine HBr (BRINTELLIX) 10 MG TABS Take 1 tablet (10 mg total) by mouth daily.  42 tablet  0   No current facility-administered medications on file prior to visit.    PAST MEDICAL HISTORY: Past Medical History  Diagnosis Date  . Osteopenia   . Osteoporosis   . Diabetes mellitus without  complication   . Hypertension   . Bipolar 1 disorder   . Hyperlipidemia   . Anemia   . Cataract   . Unspecified vitamin D deficiency   . Glaucoma   . Chronic back pain   . Hypothyroidism     PAST SURGICAL HISTORY: Past Surgical History  Procedure Laterality Date  . Back surgery  2007  . Knee arthroscopy Bilateral   . Dilation and curettage of uterus    . Breast surgery Left     CYST REMOVAL  . Lumbar fusion  2009    FAMILY HISTORY: Family History  Problem Relation Age of Onset  . Adopted: Yes    SOCIAL HISTORY:  History   Social History  . Marital Status: Married    Spouse Name: N/A    Child 1  . Years of Education: N/A   Occupational History  . Retired Clinical cytogeneticist   Social History Main Topics  . Smoking status: Never Smoker   . Smokeless tobacco: Never Used  . Alcohol Use: No  . Drug Use: No  . Sexual Activity: No   Other Topics Concern  . Not on file   Social History Narrative  . No narrative on file    PHYSICAL EXAM   Filed Vitals:   01/08/14 0819  BP: 143/75  Pulse: 87  Height: 5\' 3"  (1.6 m)  Weight: 227 lb (102.967 kg)   Body mass index is 40.22 kg/(m^2).   Generalized: In no acute distress  Neck: Supple, no carotid bruits   Cardiac: Regular rate rhythm  Pulmonary: Clear to auscultation bilaterally  Musculoskeletal: No deformity  Neurological examination  Mentation: Alert oriented to time, place, history taking, and causual conversation  Cranial nerve II-XII: Pupils were equal round reactive to light. Extraocular movements were full.  Visual field were full on confrontational test. Bilateral fundi were sharp.  Facial sensation and strength were normal. Hearing was intact to finger rubbing bilaterally. Uvula tongue midline.  Head turning and shoulder shrug and were normal and symmetric.Tongue protrusion into cheek strength was normal.  Motor: Normal tone, bulk and strength.  Sensory: Intact to fine touch, pinprick, preserved  vibratory sensation, and proprioception at toes.  Coordination: Normal finger to nose, heel-to-shin bilaterally there was no truncal ataxia  Gait: rely on her walker, unsteady, wide based  Romberg signs: Negative  Deep tendon reflexes: Brachioradialis 2/2, biceps 2/2, triceps 2/2, patellar 2/2, Achilles 2/2, plantar responses were flexor bilaterally.   DIAGNOSTIC DATA (LABS, IMAGING, TESTING) - I reviewed patient records, labs, notes, testing and imaging myself where available.  Lab Results  Component Value Date  WBC 5.9 12/28/2013   HGB 11.5* 12/28/2013   HCT 34.9* 12/28/2013   MCV 83.3 12/28/2013   PLT 246 12/28/2013      Component Value Date/Time   NA 141 12/28/2013 1707   K 3.8 12/28/2013 1707   CL 99 12/28/2013 1707   CO2 34* 12/28/2013 1707   GLUCOSE 133* 12/28/2013 1707   BUN 34* 12/28/2013 1707   CREATININE 1.42* 12/28/2013 1707   CREATININE 1.31* 10/15/2013 0745   CALCIUM 10.3 12/28/2013 1707   PROT 6.5 12/28/2013 1707   ALBUMIN 4.0 12/28/2013 1707   AST 17 12/28/2013 1707   ALT 9 12/28/2013 1707   ALKPHOS 48 12/28/2013 1707   BILITOT 0.6 12/28/2013 1707   GFRNONAA 38* 12/28/2013 1707   GFRNONAA 41* 10/15/2013 0745   GFRAA 43* 12/28/2013 1707   GFRAA 47* 10/15/2013 0745   Lab Results  Component Value Date   CHOL 160 10/01/2013   HDL 48 10/01/2013   LDLCALC 78 10/01/2013   TRIG 68 10/13/2013   CHOLHDL 3.3 10/01/2013   Lab Results  Component Value Date   HGBA1C 7.2* 10/29/2013   Lab Results  Component Value Date   VITAMINB12 >2000* 12/28/2013   Lab Results  Component Value Date   TSH 0.853 12/28/2013      ASSESSMENT AND PLAN  MAURIAH MCMILLEN is a 68 y.o. female complains of new-onset bilaterally his tremor, worsening gait difficulty, history of lumbar degenerative disc disease, multiple lumbar decompression surgery in the past, gait difficulty, there was no rigidity, mild postural tremor,  Differentiation diagnosis including mood disorder related, CAT scan did show significant atrophy, small  vessel disease, we will do MRI of the brain,  Marcial Pacas, M.D. Ph.D.  [facility] [address], [place], [place] [address] [phone]

## 2014-01-10 ENCOUNTER — Other Ambulatory Visit: Payer: Self-pay | Admitting: Physician Assistant

## 2014-01-13 ENCOUNTER — Ambulatory Visit
Admission: RE | Admit: 2014-01-13 | Discharge: 2014-01-13 | Disposition: A | Discharge status: Home / Self Care | Payer: Medicare Other | Source: Ambulatory Visit | Attending: Neurology | Admitting: Neurology

## 2014-01-13 ENCOUNTER — Inpatient Hospital Stay
Admission: RE | Admit: 2014-01-13 | Discharge: 2014-01-13 | Disposition: A | Discharge status: Home / Self Care | Payer: Self-pay | Source: Ambulatory Visit | Attending: Internal Medicine | Admitting: Internal Medicine

## 2014-01-13 ENCOUNTER — Other Ambulatory Visit: Payer: Self-pay | Admitting: Internal Medicine

## 2014-01-13 DIAGNOSIS — R251 Tremor, unspecified: Secondary | ICD-10-CM

## 2014-01-13 DIAGNOSIS — N179 Acute kidney failure, unspecified: Secondary | ICD-10-CM

## 2014-01-13 DIAGNOSIS — E119 Type 2 diabetes mellitus without complications: Secondary | ICD-10-CM

## 2014-01-13 DIAGNOSIS — I5031 Acute diastolic (congestive) heart failure: Secondary | ICD-10-CM

## 2014-01-13 DIAGNOSIS — E785 Hyperlipidemia, unspecified: Secondary | ICD-10-CM

## 2014-01-13 DIAGNOSIS — G9340 Encephalopathy, unspecified: Secondary | ICD-10-CM

## 2014-01-13 DIAGNOSIS — F319 Bipolar disorder, unspecified: Secondary | ICD-10-CM

## 2014-01-13 DIAGNOSIS — R259 Unspecified abnormal involuntary movements: Secondary | ICD-10-CM

## 2014-01-15 ENCOUNTER — Telehealth: Payer: Self-pay | Admitting: Neurology

## 2014-01-15 NOTE — Telephone Encounter (Signed)
Avaiah: please call patient, MRI brain showed age related changes, small vessel disease, will go over detail at her follow up visit

## 2014-01-18 NOTE — Telephone Encounter (Signed)
Spoke to patient and relayed MRI brain results showing age related changes, per Dr. Krista Blue.   Patient would like to know if she can travel, she wants to know what is causing her symptoms and if there are any other tests to be done.

## 2014-01-19 NOTE — Telephone Encounter (Signed)
I have called her, re-explained mri findings again, there is evidence of atrophy, moderate small vessel disease.  She should continue aspirin, keep her hydration, continue to address vascular risk factors,

## 2014-01-20 HISTORY — PX: EYE SURGERY: SHX253

## 2014-01-28 ENCOUNTER — Ambulatory Visit: Payer: Self-pay | Admitting: Emergency Medicine

## 2014-02-04 ENCOUNTER — Encounter: Payer: Self-pay | Admitting: Emergency Medicine

## 2014-02-04 ENCOUNTER — Ambulatory Visit (INDEPENDENT_AMBULATORY_CARE_PROVIDER_SITE_OTHER): Payer: Medicare Other | Admitting: Emergency Medicine

## 2014-02-04 VITALS — BP 138/86 | HR 74 | Temp 98.6°F | Resp 18 | Ht 63.25 in | Wt 225.0 lb

## 2014-02-04 DIAGNOSIS — E039 Hypothyroidism, unspecified: Secondary | ICD-10-CM

## 2014-02-04 DIAGNOSIS — R6889 Other general symptoms and signs: Secondary | ICD-10-CM

## 2014-02-04 LAB — BASIC METABOLIC PANEL WITH GFR
BUN: 35 mg/dL — ABNORMAL HIGH (ref 6–23)
CALCIUM: 9.9 mg/dL (ref 8.4–10.5)
CHLORIDE: 102 meq/L (ref 96–112)
CO2: 32 mEq/L (ref 19–32)
Creat: 1.61 mg/dL — ABNORMAL HIGH (ref 0.50–1.10)
GFR, EST NON AFRICAN AMERICAN: 32 mL/min — AB
GFR, Est African American: 37 mL/min — ABNORMAL LOW
Glucose, Bld: 114 mg/dL — ABNORMAL HIGH (ref 70–99)
Potassium: 4.2 mEq/L (ref 3.5–5.3)
Sodium: 141 mEq/L (ref 135–145)

## 2014-02-04 MED ORDER — LEVOTHYROXINE SODIUM 50 MCG PO TABS: 50.0000 ug | ORAL_TABLET | Freq: Every day | ORAL | Status: DC

## 2014-02-04 NOTE — Patient Instructions (Signed)
FYI  Chronic Kidney Disease Chronic kidney disease occurs when the kidneys are damaged over a long period. The kidneys are two organs that lie on either side of the spine between the middle of the back and the front of the abdomen. The kidneys:   Remove wastes and extra water from the blood.   Produce important hormones. These help keep bones strong, regulate blood pressure, and help create red blood cells.   Balance the fluids and chemicals in the blood and tissues. A small amount of kidney damage may not cause problems, but a large amount of damage may make it difficult or impossible for the kidneys to work the way they should. If steps are not taken to slow down the kidney damage or stop it from getting worse, the kidneys may stop working permanently. Most of the time, chronic kidney disease does not go away. However, it can often be controlled, and those with the disease can usually live normal lives. CAUSES  The most common causes of chronic kidney disease are diabetes and high blood pressure (hypertension). Chronic kidney disease may also be caused by:   Diseases that cause kidneys' filters to become inflamed.   Diseases that affect the immune system.   Genetic diseases.   Medicines that damage the kidneys, such as anti-inflammatory medicines.  Poisoning or exposure to toxic substances.   A reoccurring kidney or urinary infection.   A problem with urine flow. This may be caused by:   Cancer.   Kidney stones.   An enlarged prostate in males. SYMPTOMS  Because the kidney damage in chronic kidney disease occurs slowly, symptoms develop slowly and may not be obvious until the kidney damage becomes severe. A person may have a kidney disease for years without showing any symptoms. Symptoms can include:   Swelling (edema) of the legs, ankles, or feet.   Tiredness (lethargy).   Nausea or vomiting.   Confusion.   Problems with urination, such as:    Decreased urine production.   Frequent urination, especially at night.   Frequent accidents in children who are potty trained.   Muscle twitches and cramps.   Shortness of breath.  Weakness.   Persistent itchiness.   Loss of appetite.  Metallic taste in the mouth.  Trouble sleeping.  Slowed development in children.  Short stature in children. DIAGNOSIS  Chronic kidney disease may be detected and diagnosed by tests, including blood, urine, imaging, or kidney biopsy tests.  TREATMENT  Most chronic kidney diseases cannot be cured. Treatment usually involves relieving symptoms and preventing or slowing the progression of the disease. Treatment may include:   A special diet. You may need to avoid alcohol and foods thatare salty and high in potassium.   Medicines. These may:   Lower blood pressure.   Relieve anemia.   Relieve swelling.   Protect the bones. HOME CARE INSTRUCTIONS   Follow your prescribed diet.   Only take over-the-counter or prescription medicines as directed by your caregiver.  Do not take any new medicines (prescription, over-the-counter, or nutritional supplements) unless approved by your caregiver. Many medicines can worsen your kidney damage or need to have the dose adjusted.   Quit smoking if you are a smoker. Talk to your caregiver about a smoking cessation program.   Keep all follow-up appointments as directed by your caregiver. SEEK IMMEDIATE MEDICAL CARE IF:  Your symptoms get worse or you develop new symptoms.   You develop symptoms of end-stage kidney disease. These include:  Headaches.   Abnormally dark or light skin.   Numbness in the hands or feet.   Easy bruising.   Frequent hiccups.   Menstruation stops.   You have a fever.   You have decreased urine production.   You havepain or bleeding when urinating. MAKE SURE YOU:  Understand these instructions.  Will watch your  condition.  Will get help right away if you are not doing well or get worse. FOR MORE INFORMATION  American Association of Kidney Patients: BombTimer.gl National Kidney Foundation: www.kidney.[place]: https://mathis.com/ [facility]: www.lifeoptions.org and www.kidneyschool.org Document Released: 04/17/2008 Document Revised: 06/25/2012 Document Reviewed: 03/07/2012 [facility] Patient Information 2015 [place], [place]. This information is not intended to replace advice given to you by your health care provider. Make sure you discuss any questions you have with your health care provider.

## 2014-02-04 NOTE — Progress Notes (Signed)
Subjective:    Patient ID: Alyssa [name], female    DOB: Nov 06, 1945, 68 y.o.   MRN: [id number]  HPI Comments: 68 yo WF needs paper work filled out for cataracts surgery on right eye.  SHe has had mild increase in right side chest wall pain and thinks it is costochondritis. She denies any recent illness or strain.   She notes she still has hand tremor but notes Brintellix has helped. SHe notes Neurologist did not have any comment on hand tremor but she does not want further workup at this time. She notes Brintellix has helped control moods.   WBC             5.9   12/28/2013 HGB            11.5   12/28/2013 HCT            34.9   12/28/2013 PLT             246   12/28/2013 GLUCOSE         133   12/28/2013 CHOL            160   10/01/2013 TRIG             68   10/13/2013 HDL              48   10/01/2013 LDLCALC          78   10/01/2013 ALT               9   12/28/2013 AST              17   12/28/2013 NA              141   12/28/2013 K               3.8   12/28/2013 CL               99   12/28/2013 CREATININE     1.42   12/28/2013 BUN              34   12/28/2013 CO2              34   12/28/2013 TSH           0.853   12/28/2013 INR            1.05   10/12/2013 HGBA1C          7.2   10/29/2013 MICROALBUR   108.09   12/28/2013     Medication List       This list is accurate as of: 02/04/14  2:17 PM.  Always use your most recent med list.               aspirin 81 MG tablet  Take 81 mg by mouth daily.     brimonidine 0.1 % Soln  Commonly known as:  ALPHAGAN P  Place 1 drop into both eyes 2 (two) times daily.     dorzolamide 2 % ophthalmic solution  Commonly known as:  TRUSOPT  Place 1 drop into both eyes 2 (two) times daily.     dorzolamide-timolol 22.3-6.8 MG/ML ophthalmic solution  Commonly known as:  COSOPT  Place 1 drop into both eyes 2 (two) times daily.     Fish Oil 1000 MG Caps  Take 1,000 mg by mouth every evening.     furosemide 40 MG tablet  Commonly known as:  LASIX  Take 1 tablet (40  mg total) by mouth 2 (two) times daily.     gabapentin 800 MG tablet  Commonly known as:  NEURONTIN  TAKE 1 TABLET BY MOUTH FOUR TIMES DAILY FOR FIBROMYALGIA     glipiZIDE 10 MG tablet  Commonly known as:  GLUCOTROL  Take 5-10 mg by mouth 2 (two) times daily as needed (only takes if needed, depending on glucose level >200).     latanoprost 0.005 % ophthalmic solution  Commonly known as:  XALATAN  Place 1 drop into the right eye at bedtime.     levothyroxine 50 MCG tablet  Commonly known as:  SYNTHROID  Take 1 tablet (50 mcg total) by mouth daily before breakfast.     losartan 100 MG tablet  Commonly known as:  COZAAR  Take 100 mg by mouth 2 (two) times daily.     magnesium oxide 400 MG tablet  Commonly known as:  MAG-OX  Take 1 tablet (400 mg total) by mouth 2 (two) times daily.     metFORMIN 500 MG (MOD) 24 hr tablet  Commonly known as:  GLUMETZA  Take 1,000 mg by mouth 2 (two) times daily with a meal.     minoxidil 10 MG tablet  Commonly known as:  LONITEN  Take 5-10 mg by mouth daily. Alternating between 5mg  and 10mg  everyday.     multivitamin tablet  Take 1 tablet by mouth daily.     PROBIOTIC DAILY PO  Take 1 tablet by mouth every morning.     rosuvastatin 5 MG tablet  Commonly known as:  CRESTOR  Take 1 tablet (5 mg total) by mouth daily.     traZODone 150 MG tablet  Commonly known as:  DESYREL  Take 150 mg by mouth at bedtime.     vitamin B-12 1000 MCG tablet  Commonly known as:  CYANOCOBALAMIN  Take 1,000 mcg by mouth daily.     VITAMIN D-3 PO  Take 5,000 Units by mouth every evening.     Vortioxetine HBr 10 MG Tabs  Commonly known as:  BRINTELLIX  Take 1 tablet (10 mg total) by mouth daily.       Allergies  Allergen Reactions  . Ciprofloxacin Hcl Diarrhea  . Cymbalta [Duloxetine Hcl] Other (See Comments)    Per pt: it did not work  . Codeine Other (See Comments)    Per pt: unknown  . Lortab [Hydrocodone-Acetaminophen] Other (See Comments)     Per pt: unknown  . Oxycodone Other (See Comments)    Per pt: unknown   Past Medical History  Diagnosis Date  . Osteopenia   . Osteoporosis   . Diabetes mellitus without complication   . Hypertension   . Bipolar 1 disorder   . Hyperlipidemia   . Anemia   . Cataract   . Unspecified vitamin D deficiency   . Glaucoma   . Chronic back pain   . Hypothyroidism    Review of Systems  Cardiovascular: Positive for chest pain.       Right side wall pain  All other systems reviewed and are negative.  BP 138/86  Pulse 74  Temp(Src) 98.6 F (37 C) (Temporal)  Resp 18  Ht 5' 3.25" (1.607 m)  Wt 225 lb (102.059 kg)  BMI 39.52 kg/m2     Objective:   Physical Exam  Nursing note and vitals reviewed. Constitutional: She is oriented to person, place, and time. She appears well-developed and  well-nourished. No distress.  obese  HENT:  Head: Normocephalic and atraumatic.  Right Ear: External ear normal.  Left Ear: External ear normal.  Nose: Nose normal.  Mouth/Throat: Oropharynx is clear and moist.  Eyes: Conjunctivae and EOM are normal.  Neck: Normal range of motion. Neck supple. No JVD present. No thyromegaly present.  Cardiovascular: Normal rate, regular rhythm, normal heart sounds and intact distal pulses.   Pulmonary/Chest: Effort normal and breath sounds normal. No respiratory distress. She has no wheezes. She has no rales. She exhibits no tenderness.  Abdominal: Soft. Bowel sounds are normal. She exhibits no distension. There is no tenderness.  Musculoskeletal: Normal range of motion. She exhibits no edema and no tenderness.  walker  Lymphadenopathy:    She has no cervical adenopathy.  Neurological: She is alert and oriented to person, place, and time. No cranial nerve deficit.  Skin: Skin is warm and dry. No rash noted. No erythema. No pallor.  Psychiatric: She has a normal mood and affect. Her behavior is normal. Judgment and thought content normal.           Assessment & Plan:  1. Abnormal labs- Recheck labs  2. Depression/ mood disorder- Continue RX same, advise QD exercise to help with symptoms SX #10 noxes of Brintellix continue AD.  3. ? Chest wall strain- continue to monitor and try to increase activity level, call if symptoms increase for XRAY vs possibility of Gallbladder disease advised of signs to monitor for. w/c if SX increase or ER.   SX Crestor 5 mg #28 use AD

## 2014-02-05 ENCOUNTER — Other Ambulatory Visit: Payer: Self-pay | Admitting: Emergency Medicine

## 2014-02-05 DIAGNOSIS — R6889 Other general symptoms and signs: Secondary | ICD-10-CM

## 2014-02-05 LAB — MICROALBUMIN / CREATININE URINE RATIO
Creatinine, Urine: 48.2 mg/dL
MICROALB UR: 51.89 mg/dL — AB (ref 0.00–1.89)
Microalb Creat Ratio: 1076.6 mg/g — ABNORMAL HIGH (ref 0.0–30.0)

## 2014-02-14 ENCOUNTER — Other Ambulatory Visit: Payer: Self-pay | Admitting: Physician Assistant

## 2014-02-16 ENCOUNTER — Ambulatory Visit: Payer: Medicare Other | Admitting: Neurology

## 2014-02-23 ENCOUNTER — Other Ambulatory Visit: Payer: Self-pay | Admitting: Physician Assistant

## 2014-03-19 ENCOUNTER — Other Ambulatory Visit: Payer: Self-pay | Admitting: Internal Medicine

## 2014-03-19 ENCOUNTER — Other Ambulatory Visit: Payer: Self-pay | Admitting: Emergency Medicine

## 2014-04-01 ENCOUNTER — Encounter: Payer: Self-pay | Admitting: Physician Assistant

## 2014-04-01 ENCOUNTER — Ambulatory Visit (INDEPENDENT_AMBULATORY_CARE_PROVIDER_SITE_OTHER): Payer: Medicare Other | Admitting: Physician Assistant

## 2014-04-01 ENCOUNTER — Telehealth: Payer: Self-pay

## 2014-04-01 VITALS — BP 138/80 | HR 64 | Temp 97.7°F | Resp 16 | Ht 63.25 in | Wt 218.0 lb

## 2014-04-01 DIAGNOSIS — M791 Myalgia, unspecified site: Secondary | ICD-10-CM

## 2014-04-01 DIAGNOSIS — I10 Essential (primary) hypertension: Secondary | ICD-10-CM

## 2014-04-01 DIAGNOSIS — E785 Hyperlipidemia, unspecified: Secondary | ICD-10-CM

## 2014-04-01 DIAGNOSIS — F319 Bipolar disorder, unspecified: Secondary | ICD-10-CM

## 2014-04-01 DIAGNOSIS — N390 Urinary tract infection, site not specified: Secondary | ICD-10-CM

## 2014-04-01 DIAGNOSIS — F3289 Other specified depressive episodes: Secondary | ICD-10-CM

## 2014-04-01 DIAGNOSIS — E559 Vitamin D deficiency, unspecified: Secondary | ICD-10-CM

## 2014-04-01 DIAGNOSIS — Z79899 Other long term (current) drug therapy: Secondary | ICD-10-CM

## 2014-04-01 DIAGNOSIS — I5031 Acute diastolic (congestive) heart failure: Secondary | ICD-10-CM

## 2014-04-01 DIAGNOSIS — M810 Age-related osteoporosis without current pathological fracture: Secondary | ICD-10-CM

## 2014-04-01 DIAGNOSIS — M7071 Other bursitis of hip, right hip: Secondary | ICD-10-CM

## 2014-04-01 DIAGNOSIS — I509 Heart failure, unspecified: Secondary | ICD-10-CM

## 2014-04-01 DIAGNOSIS — F32A Depression, unspecified: Secondary | ICD-10-CM

## 2014-04-01 DIAGNOSIS — IMO0001 Reserved for inherently not codable concepts without codable children: Secondary | ICD-10-CM

## 2014-04-01 DIAGNOSIS — M76899 Other specified enthesopathies of unspecified lower limb, excluding foot: Secondary | ICD-10-CM

## 2014-04-01 DIAGNOSIS — E1129 Type 2 diabetes mellitus with other diabetic kidney complication: Secondary | ICD-10-CM

## 2014-04-01 DIAGNOSIS — N183 Chronic kidney disease, stage 3 unspecified: Secondary | ICD-10-CM

## 2014-04-01 DIAGNOSIS — Z23 Encounter for immunization: Secondary | ICD-10-CM

## 2014-04-01 DIAGNOSIS — F329 Major depressive disorder, single episode, unspecified: Secondary | ICD-10-CM

## 2014-04-01 DIAGNOSIS — D649 Anemia, unspecified: Secondary | ICD-10-CM

## 2014-04-01 DIAGNOSIS — Z0000 Encounter for general adult medical examination without abnormal findings: Secondary | ICD-10-CM

## 2014-04-01 LAB — CBC WITH DIFFERENTIAL/PLATELET
Basophils Absolute: 0 10*3/uL (ref 0.0–0.1)
Basophils Relative: 0 % (ref 0–1)
EOS ABS: 0.1 10*3/uL (ref 0.0–0.7)
EOS PCT: 2 % (ref 0–5)
HCT: 34.6 % — ABNORMAL LOW (ref 36.0–46.0)
Hemoglobin: 11.3 g/dL — ABNORMAL LOW (ref 12.0–15.0)
Lymphocytes Relative: 24 % (ref 12–46)
Lymphs Abs: 1.5 10*3/uL (ref 0.7–4.0)
MCH: 28.3 pg (ref 26.0–34.0)
MCHC: 32.7 g/dL (ref 30.0–36.0)
MCV: 86.5 fL (ref 78.0–100.0)
Monocytes Absolute: 0.2 10*3/uL (ref 0.1–1.0)
Monocytes Relative: 4 % (ref 3–12)
Neutro Abs: 4.3 10*3/uL (ref 1.7–7.7)
Neutrophils Relative %: 70 % (ref 43–77)
PLATELETS: 242 10*3/uL (ref 150–400)
RBC: 4 MIL/uL (ref 3.87–5.11)
RDW: 15.8 % — AB (ref 11.5–15.5)
WBC: 6.2 10*3/uL (ref 4.0–10.5)

## 2014-04-01 LAB — HEMOGLOBIN A1C
HEMOGLOBIN A1C: 7.1 % — AB (ref <5.7)
Mean Plasma Glucose: 157 mg/dL — ABNORMAL HIGH (ref <117)

## 2014-04-01 MED ORDER — DEXAMETHASONE SODIUM PHOSPHATE 100 MG/10ML IJ SOLN
10.0000 mg | Freq: Once | INTRAMUSCULAR | Status: AC
  Administered 2014-04-01: 10 mg via INTRAMUSCULAR

## 2014-04-01 NOTE — Telephone Encounter (Signed)
Left message for patient to return call about referral to Nephrology.

## 2014-04-01 NOTE — Progress Notes (Signed)
MEDICARE ANNUAL WELLNESS VISIT AND FOLLOW UP  Assessment:   1. Acute diastolic congestive heart failure Weight stable  2. Essential hypertension - CBC with Differential - BASIC METABOLIC PANEL WITH GFR - Hepatic function panel - TSH - Microalbumin / creatinine urine ratio  3. Osteoporosis Up to date on Dexa, better  4. Chronic renal insufficiency, stage III (moderate) Due to DM, check BMP, wants referral to kidney doctor  5. Bipolar 1 disorder Continue follow up/medications  6. Anemia, unspecified anemia type Check CBC  7. Depression Continue follow up, continue medications  8. Hyperlipidemia -continue medications, check lipids, decrease fatty foods, increase activity.  - Lipid panel  9. Obesity, morbid Obesity with co morbidities- long discussion about weight loss, diet, and exercise  10. Unspecified vitamin D deficiency - Vit D  25 hydroxy (rtn osteoporosis monitoring)  11. Type II or unspecified type diabetes mellitus with renal manifestations, not stated as uncontrolled Discussed general issues about diabetes pathophysiology and management., Educational material distributed., Suggested low cholesterol diet., Encouraged aerobic exercise., Discussed foot care., Reminded to get yearly retinal exam. - Hemoglobin A1c - HM DIABETES FOOT EXAM  12. Myalgia Patient has diffuse pain, worse in hands/feet, she would like an autoimmune screen.  - Anti-DNA antibody, double-stranded - ANA - Sedimentation rate - Rheumatoid factor - CK - Uric acid  13. Need for prophylactic vaccination and inoculation against influenza - Flu vaccine HIGH DOSE PF  14. Encounter for long-term (current) use of other medications - Magnesium  15. Urinary tract infection, site not specified - Urinalysis, Routine w reflex microscopic - Urine culture  16. Ischial bursitis- A trigger point injection was performed at the site of maximal tenderness using 1% plain Lidocaine and dexamethasone.  This was well tolerated, and followed by immediate relief of pain.    Plan:   During the course of the visit the patient was educated and counseled about appropriate screening and preventive services including:    Pneumococcal vaccine   Influenza vaccine  Td vaccine  Screening electrocardiogram  Screening mammography  Bone densitometry screening  Colorectal cancer screening  Diabetes screening  Glaucoma screening  Nutrition counseling   Advanced directives: given info/requested  Screening recommendations, referrals:  Vaccinations: Tdap vaccine declined Influenza vaccine ordered Pneumococcal vaccine prevnar next visit Shingles vaccine not indicated Hep B vaccine not indicated  Nutrition assessed and recommended  Colonoscopy up to date Mammogram up to date Pap smear not indicated Pelvic exam not indicated Recommended yearly ophthalmology/optometry visit for glaucoma screening and checkup Recommended yearly dental visit for hygiene and checkup Advanced directives - declined  Conditions/risks identified: BMI: Discussed weight loss, diet, and increase physical activity.  Increase physical activity: AHA recommends 150 minutes of physical activity a week.  Medications reviewed DEXA- up to date Diabetes is not at goal, ACE/ARB therapy: Yes. Urinary Incontinence is an issue: discussed non pharmacology and pharmacology options.  Fall risk: high- discussed PT, home fall assessment, medications. She states she can not afford PT at this time, if she decides to get it she will call   Subjective:   Alyssa [name] is a 68 y.o. female who presents for Medicare Annual Wellness Visit and 3 month follow up on hypertension, diabetes, hyperlipidemia, vitamin D def.  Date of last medicare wellness visit is unknown.   Her blood pressure has been controlled at home, today their BP is BP: 138/80 mmHg She does not workout due to pain. She denies chest pain, shortness of breath,  dizziness.  She is on  cholesterol medication and denies myalgias. Her cholesterol is at goal. The cholesterol last visit was:   Lab Results  Component Value Date   CHOL 160 10/01/2013   HDL 48 10/01/2013   LDLCALC 78 10/01/2013   TRIG 68 10/13/2013   CHOLHDL 3.3 10/01/2013   She has not been working on diet and exercise for diabetes, she has CKD stage III from DM, she is on metformin/glipizide and ARB, denies hypoglycemia, and denies polydipsia and polyuria. Last A1C in the office was:  Lab Results  Component Value Date   HGBA1C 7.2* 10/29/2013   Patient is on Vitamin D supplement. Lab Results  Component Value Date   VD25OH 50 06/23/2013     Patient had diastolic heart failure and was in the hospital for right pleural effusion, COPD and respiratory failure. This has resolved.  She has been seeing Dr. Krista Blue for worsening memory and had MRI that showed vascular dementia. She is on Brintellix for depression and cognition which is helping her and it has helped her tremor. .  She states she has pain everywhere, in her hands, hips, and right buttock. She can not walk by herself, walks with walker, she was doing water aerobics which helped but she was unable to continue do to cost.  She is on thyroid medication. Her medication was not changed last visit. Patient denies nervousness and palpitations.  Lab Results  Component Value Date   TSH 0.853 12/28/2013  .   Names of Other Physician/Practitioners you currently use: 1. [facility]- here for primary care 2. none, dentist, last visit years Patient Care Team: Unk Pinto, MD as PCP - General (Internal Medicine) Lafayette Dragon, MD as Consulting Physician (Gastroenterology) Marcial Pacas, MD as Consulting Physician (Neurology) Estevan Ryder, MD as Referring Physician (Cardiology) Roselie Awkward, MD as Consulting Physician (Ophthalmology)- yesterday Jari Pigg, MD as Consulting Physician  (Dermatology)  Medication Review Current Outpatient Prescriptions on File Prior to Visit  Medication Sig Dispense Refill  . aspirin 81 MG tablet Take 81 mg by mouth daily.      . brimonidine (ALPHAGAN P) 0.1 % SOLN Place 1 drop into both eyes 2 (two) times daily.      . Cholecalciferol (VITAMIN D-3 PO) Take 5,000 Units by mouth every evening.       . dorzolamide (TRUSOPT) 2 % ophthalmic solution Place 1 drop into both eyes 2 (two) times daily.      . dorzolamide-timolol (COSOPT) 22.3-6.8 MG/ML ophthalmic solution Place 1 drop into both eyes 2 (two) times daily.       . furosemide (LASIX) 40 MG tablet TAKE 1 TABLET BY MOUTH TWICE DAILY  60 tablet  2  . gabapentin (NEURONTIN) 800 MG tablet TAKE 1 TABLET BY MOUTH FOUR TIMES DAILY FOR FIBROMYALGIA  120 tablet  3  . glipiZIDE (GLUCOTROL) 10 MG tablet Take 5-10 mg by mouth 2 (two) times daily as needed (only takes if needed, depending on glucose level >200).       Marland Kitchen latanoprost (XALATAN) 0.005 % ophthalmic solution Place 1 drop into the right eye at bedtime.      Marland Kitchen levothyroxine (SYNTHROID) 50 MCG tablet Take 1 tablet (50 mcg total) by mouth daily before breakfast.  90 tablet  2  . losartan (COZAAR) 100 MG tablet Take 100 mg by mouth 2 (two) times daily.       Marland Kitchen MAGNESIUM-OXIDE 400 (241.3 MG) MG tablet TAKE 1 TABLET BY MOUTH TWICE DAILY  180  tablet  99  . metFORMIN (GLUCOPHAGE-XR) 500 MG 24 hr tablet TAKE 2 TABLETS BY MOUTH TWICE DAILY FOR DIABETES  360 tablet  2  . metFORMIN (GLUMETZA) 500 MG (MOD) 24 hr tablet Take 1,000 mg by mouth 2 (two) times daily with a meal.      . minoxidil (LONITEN) 10 MG tablet Take 5-10 mg by mouth daily. Alternating between 5mg  and 10mg  everyday.      . Multiple Vitamin (MULTIVITAMIN) tablet Take 1 tablet by mouth daily.      . Omega-3 Fatty Acids (FISH OIL) 1000 MG CAPS Take 1,000 mg by mouth every evening.       . Probiotic Product (PROBIOTIC DAILY PO) Take 1 tablet by mouth every morning.       . rosuvastatin  (CRESTOR) 5 MG tablet Take 1 tablet (5 mg total) by mouth daily.  28 tablet  0  . traZODone (DESYREL) 150 MG tablet TAKE 1/3 TO 1/2 TO 1 TABLET BY MOUTH EVERY NIGHT AT BEDTIME AS NEEDED FOR SLEEP  90 tablet  0  . vitamin B-12 (CYANOCOBALAMIN) 1000 MCG tablet Take 1,000 mcg by mouth daily.      . Vortioxetine HBr (BRINTELLIX) 10 MG TABS Take 1 tablet (10 mg total) by mouth daily.  42 tablet  0   No current facility-administered medications on file prior to visit.    Current Problems (verified) Patient Active Problem List   Diagnosis Date Noted  . Tremor 01/12/2014  . Abnormality of gait 01/12/2014  . Chronic renal insufficiency, stage III (moderate) 11/04/2013  . Pleural effusion- tapped 10/14/2013  . Abnormal EKG 10/14/2013  . Acute diastolic congestive heart failure 10/13/2013  . Community acquired pneumonia 10/12/2013  . Acute respiratory failure with hypoxia 10/12/2013  . Acute encephalopathy- resolved 10/12/2013  . Acute kidney injury 10/12/2013  . Obesity, morbid 07/15/2013  . Depression 07/15/2013  . Unspecified vitamin D deficiency   . Osteopenia   . Osteoporosis   . Type II or unspecified type diabetes mellitus with renal manifestations, not stated as uncontrolled   . Hypertension   . Bipolar 1 disorder   . Hyperlipidemia   . Anemia   . Cataract     Screening Tests Health Maintenance  Topic Date Due  . Ophthalmology Exam  12/04/1955  . Tetanus/tdap  07/23/2012  . Influenza Vaccine  02/20/2014  . Hemoglobin A1c  04/30/2014  . Foot Exam  10/05/2014  . Urine Microalbumin  02/05/2015  . Mammogram  12/26/2015  . Colonoscopy  10/08/2018  . Pneumococcal Polysaccharide Vaccine Age 13 And Over  Completed  . Zostavax  Completed     Immunization History  Administered Date(s) Administered  . Influenza Split 04/22/2013  . Pneumococcal Polysaccharide-23 07/23/2001, 07/23/2010  . Td 07/23/2002  . Zoster 07/23/2006    Preventative care: Last colonoscopy:  09/2013 Last mammogram: 12/2013 Last pap smear/pelvic exam: 2007  DEXA:12/2013 Myoview stress normal 09/2013, EF 65% Renal US normal 09/2013  Prior vaccinations: TD or Tdap: 2004- needs Influenza: 2014 DUE Pneumococcal: 2012  Prevnar 13: Needs Shingles/Zostavax: 2008  History reviewed: allergies, current medications, past family history, past medical history, past social history, past surgical history and problem list  Risk Factors: Osteoporosis: postmenopausal estrogen deficiency and dietary calcium and/or vitamin D deficiency History of fracture in the past year: no  Tobacco History  Substance Use Topics  . Smoking status: Never Smoker   . Smokeless tobacco: Never Used  . Alcohol Use: No   She does not smoke.  Patient is a former smoker. Are there smokers in your home (other than you)?  No  Alcohol Current alcohol use: none  Caffeine Current caffeine use: coffee 1 /day  Exercise Current exercise: none  Nutrition/Diet Current diet: in general, an "unhealthy" diet  Cardiac risk factors: advanced age (older than 44 for men, 64 for women), diabetes mellitus, dyslipidemia, hypertension, obesity (BMI >= 30 kg/m2) and sedentary lifestyle.  Depression Screen (Note: if answer to either of the following is "Yes", a more complete depression screening is indicated)   Q1: Over the past two weeks, have you felt down, depressed or hopeless? Yes  Q2: Over the past two weeks, have you felt little interest or pleasure in doing things? Yes  Have you lost interest or pleasure in daily life? Yes  Do you often feel hopeless? No  Do you cry easily over simple problems? No  Activities of Daily Living In your present state of health, do you have any difficulty performing the following activities?:  Driving? Yes Managing money?  No Feeding yourself? No Getting from bed to chair? Yes Climbing a flight of stairs? Yes Preparing food and eating?: No Bathing or showering? Yes Getting  dressed: No Getting to the toilet? Yes Using the toilet:Yes Moving around from place to place: Yes In the past year have you fallen or had a near fall?:Yes   Are you sexually active?  No  Do you have more than one partner?  No  Vision Difficulties: No  Hearing Difficulties: Yes Do you often ask people to speak up or repeat themselves? Yes Do you experience ringing or noises in your ears? Yes Do you have difficulty understanding soft or whispered voices? Yes  Cognition  Do you feel that you have a problem with memory?Yes  Do you often misplace items? Yes  Do you feel safe at home?  Yes  Advanced directives Does patient have a [place]? No Does patient have a Living Will? No   Objective:   Blood pressure 138/80, pulse 64, temperature 97.7 F (36.5 C), resp. rate 16, weight 218 lb (98.884 kg). Body mass index is 38.29 kg/(m^2).  General appearance: alert, no distress, WD/WN,  female Cognitive Testing  Alert? Yes  Normal Appearance?Yes  Oriented to person? Yes  Place? Yes   Time? Yes  Recall of three objects?  No  Can perform simple calculations? No  Displays appropriate judgment?Yes  Can read the correct time from a watch face?Yes  HEENT: normocephalic, sclerae anicteric, TMs pearly, nares patent, no discharge or erythema, pharynx normal Oral cavity: MMM, no lesions Neck: supple, no lymphadenopathy, no thyromegaly, no masses Heart: RRR, normal S1, S2, no murmurs Lungs: CTA bilaterally, no wheezes, rhonchi, or rales Abdomen: +bs, soft, obese non tender, non distended, no masses, no hepatomegaly, no splenomegaly Musculoskeletal: diffuse tenderness, pin point tenderness at ischial bursa.  Extremities: no edema, no cyanosis, no clubbing Pulses: 2+ symmetric, upper and lower extremities, normal cap refill Neurological: alert, oriented x 3, CN2-12 intact, strength normal upper extremities and lower extremities, sensation normal throughout, DTRs 2+  throughout, no cerebellar signs,walks with walker, unsteady Psychiatric: normal affect, behavior normal, pleasant  Breast: defer Gyn: defer Rectal: defer  Medicare Attestation I have personally reviewed: The patient's medical and social history Their use of alcohol, tobacco or illicit drugs Their current medications and supplements The patient's functional ability including ADLs,fall risks, home safety risks, cognitive, and hearing and visual impairment Diet and physical activities Evidence for depression or mood  disorders  The patient's weight, height, BMI, and visual acuity have been recorded in the chart.  I have made referrals, counseling, and provided education to the patient based on review of the above and I have provided the patient with a written personalized care plan for preventive services.     Vicie Mutters, PA-C   04/01/2014

## 2014-04-01 NOTE — Patient Instructions (Signed)

## 2014-04-02 LAB — BASIC METABOLIC PANEL WITH GFR
BUN: 32 mg/dL — ABNORMAL HIGH (ref 6–23)
CALCIUM: 10.9 mg/dL — AB (ref 8.4–10.5)
CO2: 31 mEq/L (ref 19–32)
CREATININE: 1.6 mg/dL — AB (ref 0.50–1.10)
Chloride: 98 mEq/L (ref 96–112)
GFR, EST NON AFRICAN AMERICAN: 33 mL/min — AB
GFR, Est African American: 38 mL/min — ABNORMAL LOW
Glucose, Bld: 109 mg/dL — ABNORMAL HIGH (ref 70–99)
Potassium: 4 mEq/L (ref 3.5–5.3)
Sodium: 141 mEq/L (ref 135–145)

## 2014-04-02 LAB — LIPID PANEL
CHOLESTEROL: 147 mg/dL (ref 0–200)
HDL: 58 mg/dL (ref >39)
LDL Cholesterol: 68 mg/dL (ref 0–99)
Total CHOL/HDL Ratio: 2.5 Ratio
Triglycerides: 105 mg/dL (ref <150)
VLDL: 21 mg/dL (ref 0–40)

## 2014-04-02 LAB — URINALYSIS, MICROSCOPIC ONLY
Bacteria, UA: NONE SEEN
CASTS: NONE SEEN
CRYSTALS: NONE SEEN
SQUAMOUS EPITHELIAL / LPF: NONE SEEN

## 2014-04-02 LAB — URINALYSIS, ROUTINE W REFLEX MICROSCOPIC
BILIRUBIN URINE: NEGATIVE
GLUCOSE, UA: NEGATIVE mg/dL
Hgb urine dipstick: NEGATIVE
Ketones, ur: NEGATIVE mg/dL
Leukocytes, UA: NEGATIVE
Nitrite: NEGATIVE
PH: 7.5 (ref 5.0–8.0)
Protein, ur: 30 mg/dL — AB
Specific Gravity, Urine: 1.009 (ref 1.005–1.030)
Urobilinogen, UA: 0.2 mg/dL (ref 0.0–1.0)

## 2014-04-02 LAB — URINE CULTURE: Colony Count: 15000

## 2014-04-02 LAB — TSH: TSH: 2.35 u[IU]/mL (ref 0.350–4.500)

## 2014-04-02 LAB — HEPATIC FUNCTION PANEL
ALBUMIN: 4.3 g/dL (ref 3.5–5.2)
ALT: 9 U/L (ref 0–35)
AST: 15 U/L (ref 0–37)
Alkaline Phosphatase: 39 U/L (ref 39–117)
BILIRUBIN TOTAL: 0.5 mg/dL (ref 0.2–1.2)
Bilirubin, Direct: 0.1 mg/dL (ref 0.0–0.3)
Indirect Bilirubin: 0.4 mg/dL (ref 0.2–1.2)
Total Protein: 6.6 g/dL (ref 6.0–8.3)

## 2014-04-02 LAB — CK: Total CK: 31 U/L (ref 7–177)

## 2014-04-02 LAB — RHEUMATOID FACTOR

## 2014-04-02 LAB — URIC ACID: Uric Acid, Serum: 7.9 mg/dL — ABNORMAL HIGH (ref 2.4–7.0)

## 2014-04-02 LAB — ANA: Anti Nuclear Antibody(ANA): NEGATIVE

## 2014-04-02 LAB — MAGNESIUM: MAGNESIUM: 2.1 mg/dL (ref 1.5–2.5)

## 2014-04-02 LAB — SEDIMENTATION RATE: Sed Rate: 5 mm/hr (ref 0–22)

## 2014-04-02 LAB — ANTI-DNA ANTIBODY, DOUBLE-STRANDED

## 2014-04-02 LAB — MICROALBUMIN / CREATININE URINE RATIO
Creatinine, Urine: 38.9 mg/dL
MICROALB UR: 26.2 mg/dL — AB (ref 0.00–1.89)
Microalb Creat Ratio: 673.5 mg/g — ABNORMAL HIGH (ref 0.0–30.0)

## 2014-04-02 LAB — VITAMIN D 25 HYDROXY (VIT D DEFICIENCY, FRACTURES): VIT D 25 HYDROXY: 74 ng/mL (ref 30–89)

## 2014-04-04 ENCOUNTER — Other Ambulatory Visit: Payer: Self-pay | Admitting: Emergency Medicine

## 2014-04-06 ENCOUNTER — Other Ambulatory Visit: Payer: Self-pay | Admitting: Emergency Medicine

## 2014-04-06 MED ORDER — FEBUXOSTAT 40 MG PO TABS: 40.0000 mg | ORAL_TABLET | Freq: Every day | ORAL | Status: DC

## 2014-04-07 ENCOUNTER — Telehealth: Payer: Self-pay

## 2014-04-07 ENCOUNTER — Encounter: Payer: Self-pay | Admitting: Physician Assistant

## 2014-04-07 NOTE — Telephone Encounter (Signed)
Spoke with patient and went over lab results 

## 2014-04-07 NOTE — Telephone Encounter (Signed)
Per daughters request  (Amy-via mychart message) called patient to discuss labs and instructions, spoke with patient regarding her Nephrology referral to [facility], patient expressed concern that she has not heard anything regarding her referral and it has been since July. I advised her that it does appear that Ria Comment has called and left her a message to return call to office regarding referral but patient states she has not received any messages from [place]. I called [facility] for her at [phone] and spoke with Beatrix Shipper, she did advise that she did receive patients referral on January 30, 2014, she was received as a category 4 referral and that she was just now scheduling appointments on June referrals so it could quite possibly be November before patient was scheduled. I did relay all this information to the patient and offered to try to schedule in [place] at [facility] but she declined and stated that she would wait. She has a follow up scheduled 05-18-14 with Dr Melford Aase.

## 2014-04-29 ENCOUNTER — Other Ambulatory Visit: Payer: Self-pay | Admitting: Emergency Medicine

## 2014-05-04 ENCOUNTER — Other Ambulatory Visit: Payer: Self-pay | Admitting: *Deleted

## 2014-05-04 MED ORDER — LOSARTAN POTASSIUM 100 MG PO TABS: 100.0000 mg | ORAL_TABLET | Freq: Two times a day (BID) | ORAL | Status: DC

## 2014-05-06 ENCOUNTER — Other Ambulatory Visit: Payer: Self-pay | Admitting: Internal Medicine

## 2014-05-06 MED ORDER — LOSARTAN POTASSIUM 100 MG PO TABS: 100.0000 mg | ORAL_TABLET | Freq: Every day | ORAL | Status: DC

## 2014-05-07 ENCOUNTER — Telehealth: Payer: Self-pay | Admitting: *Deleted

## 2014-05-07 NOTE — Telephone Encounter (Signed)
[place] requires an authorization for Losartan 100 mg twice daily.  Per Dr Melford Aase,  Patient needs to take 1 daily and monitor her BP.  If her BP starts being too high, he can switch to a different BP med.  Patient aware.

## 2014-05-18 ENCOUNTER — Ambulatory Visit (INDEPENDENT_AMBULATORY_CARE_PROVIDER_SITE_OTHER): Payer: Medicare Other | Admitting: Internal Medicine

## 2014-05-18 VITALS — BP 130/82 | HR 76 | Temp 97.3°F | Resp 16 | Ht 63.25 in | Wt 219.6 lb

## 2014-05-18 DIAGNOSIS — Z23 Encounter for immunization: Secondary | ICD-10-CM

## 2014-05-18 DIAGNOSIS — E790 Hyperuricemia without signs of inflammatory arthritis and tophaceous disease: Secondary | ICD-10-CM

## 2014-05-18 DIAGNOSIS — Z79899 Other long term (current) drug therapy: Secondary | ICD-10-CM

## 2014-05-18 DIAGNOSIS — I10 Essential (primary) hypertension: Secondary | ICD-10-CM

## 2014-05-18 MED ORDER — VORTIOXETINE HBR 10 MG PO TABS: 10.0000 mg | ORAL_TABLET | Freq: Every day | ORAL | Status: DC

## 2014-05-18 MED ORDER — PHENTERMINE HCL 37.5 MG PO TABS: ORAL_TABLET | ORAL | Status: DC

## 2014-05-18 MED ORDER — LOSARTAN POTASSIUM 100 MG PO TABS: 100.0000 mg | ORAL_TABLET | Freq: Every day | ORAL | Status: DC

## 2014-05-19 ENCOUNTER — Encounter: Payer: Self-pay | Admitting: Internal Medicine

## 2014-05-19 LAB — HEPATIC FUNCTION PANEL
ALBUMIN: 4.7 g/dL (ref 3.5–5.2)
ALT: 9 U/L (ref 0–35)
AST: 16 U/L (ref 0–37)
Alkaline Phosphatase: 43 U/L (ref 39–117)
BILIRUBIN INDIRECT: 0.5 mg/dL (ref 0.2–1.2)
Bilirubin, Direct: 0.1 mg/dL (ref 0.0–0.3)
TOTAL PROTEIN: 7.3 g/dL (ref 6.0–8.3)
Total Bilirubin: 0.6 mg/dL (ref 0.2–1.2)

## 2014-05-19 LAB — URIC ACID: Uric Acid, Serum: 8.6 mg/dL — ABNORMAL HIGH (ref 2.4–7.0)

## 2014-05-19 NOTE — Progress Notes (Signed)
Patient ID: Alyssa [name], female   DOB: 1946/03/28, 68 y.o.   MRN: [id number]   This very nice 68 y.o.MWF presents for  follow up with Hypertension, Hyperlipidemia, Pre-Diabetes and Vitamin D Deficiency.    Patient is treated for HTN & BP has been controlled at home. Today's BP: 130/82 mmHg. Patient has had no complaints of any cardiac type chest pain, palpitations, dyspnea/orthopnea/PND, dizziness, claudication, or dependent edema. In April patient was hospitalized and had a negative cardiac w/u.   Hyperlipidemia is controlled with diet & meds. Patient denies myalgias or other med SE's. Last Lipids were at goal with Total Chol  147; HDL 58; LDL  68; Trig 105 on 04/01/2014.   Also, the patient has history of T2_NIDDM w/CKD3 (BUN/Creat 32/1.60)  and has had no symptoms of reactive hypoglycemia, diabetic polys, paresthesias or visual blurring.  Last A1c was 7.1% on  04/01/2014.Patient has recently seen Nephrologist - Dr Joelyn Oms for management of her Diabetic CKD3.    Further, the patient also has history of Vitamin D Deficiency and supplements vitamin D without any suspected side-effects. Last vitamin D was 74 on 04/01/2014.   Medication List   aspirin 81 MG tablet  Take 81 mg by mouth daily.     dorzolamide-timolol 22.3-6.8 MG/ML ophthalmic solution  Commonly known as:  COSOPT  Place 1 drop into both eyes 2 (two) times daily.     febuxostat 40 MG tablet  Commonly known as:  ULORIC  Take 1 tablet (40 mg total) by mouth daily.     Fish Oil 1000 MG Caps  Take 1,000 mg by mouth every evening.     furosemide 40 MG tablet  Commonly known as:  LASIX  TAKE 1 TABLET BY MOUTH TWICE DAILY     gabapentin 800 MG tablet  Commonly known as:  NEURONTIN  TAKE 1 TABLET BY MOUTH FOUR TIMES DAILY FOR FIBROMYALGIA     glipiZIDE 10 MG tablet  Commonly known as:  GLUCOTROL  Take 5-10 mg by mouth 2 (two) times daily as needed (only takes if needed, depending on glucose level >200).     levothyroxine 50  MCG tablet  Commonly known as:  SYNTHROID  Take 1 tablet (50 mcg total) by mouth daily before breakfast.     losartan 100 MG tablet  Commonly known as:  COZAAR  Take 1 tablet (100 mg total) by mouth daily.     MAGNESIUM-OXIDE 400 (241.3 MG) MG tablet  Generic drug:  magnesium oxide  TAKE 1 TABLET BY MOUTH TWICE DAILY     metFORMIN 500 MG 24 hr tablet  Commonly known as:  GLUCOPHAGE-XR  TAKE 2 TABLETS BY MOUTH TWICE DAILY FOR DIABETES     minoxidil 10 MG tablet  Commonly known as:  LONITEN  Take 1/2 to 1 Tablet daily as directed for BP     multivitamin tablet  Take 1 tablet by mouth daily.     phentermine 37.5 MG tablet  Commonly known as:  ADIPEX-P  Take 1/2 to 1 tablet daily for weight loss     PROBIOTIC DAILY PO  Take 1 tablet by mouth every morning.     rosuvastatin 5 MG tablet  Commonly known as:  CRESTOR  Take 1 tablet (5 mg total) by mouth daily.     traZODone 150 MG tablet  Commonly known as:  DESYREL  TAKE 1/3 TO 1/2 TO 1 TABLET BY MOUTH EVERY NIGHT AT BEDTIME AS NEEDED FOR SLEEP     vitamin  B-12 1000 MCG tablet  Commonly known as:  CYANOCOBALAMIN  Take 1,000 mcg by mouth daily.     VITAMIN D-3 PO  Take 5,000 Units by mouth every evening.     Vortioxetine HBr 10 MG Tabs  Commonly known as:  BRINTELLIX  Take 1 tablet (10 mg total) by mouth daily.     Allergies  Allergen Reactions  . Ciprofloxacin Hcl Diarrhea  . Cymbalta [Duloxetine Hcl] Other (See Comments)    Per pt: it did not work  . Codeine Other (See Comments)    Per pt: unknown  . Lortab [Hydrocodone-Acetaminophen] Other (See Comments)    Per pt: unknown  . Oxycodone Other (See Comments)    Per pt: unknown   PMHx:   Past Medical History  Diagnosis Date  . Osteopenia   . Osteoporosis   . Diabetes mellitus without complication   . Hypertension   . Bipolar 1 disorder   . Hyperlipidemia   . Anemia   . Cataract   . Unspecified vitamin D deficiency   . Glaucoma   . Chronic back pain    . Hypothyroidism    Immunization History  Administered Date(s) Administered  . DT 05/18/2014  . Influenza Split 04/22/2013  . Influenza, High Dose Seasonal PF 04/01/2014  . Pneumococcal Polysaccharide-23 07/23/2001, 07/23/2010  . Td 07/23/2002  . Zoster 07/23/2006   Past Surgical History  Procedure Laterality Date  . Back surgery  2007  . Knee arthroscopy Bilateral   . Dilation and curettage of uterus    . Breast surgery Left     CYST REMOVAL   Lumbar fusion   2009   Colonoscopsy  Oct 07, 2013  .      FHx:    Reviewed / unchanged  SHx:    Reviewed / unchanged  Systems Review:  Constitutional: Denies fever, chills, wt changes, headaches, insomnia, fatigue, night sweats, change in appetite. Eyes: Denies redness, blurred vision, diplopia, discharge, itchy, watery eyes.  ENT: Denies discharge, congestion, post nasal drip, epistaxis, sore throat, earache, hearing loss, dental pain, tinnitus, vertigo, sinus pain, snoring.  CV: Denies chest pain, palpitations, irregular heartbeat, syncope, dyspnea, diaphoresis, orthopnea, PND, claudication or edema. Respiratory: denies cough, dyspnea, DOE, pleurisy, hoarseness, laryngitis, wheezing.  Gastrointestinal: Denies dysphagia, odynophagia, heartburn, reflux, water brash, abdominal pain or cramps, nausea, vomiting, bloating, diarrhea, constipation, hematemesis, melena, hematochezia  or hemorrhoids. Genitourinary: Denies dysuria, frequency, urgency, nocturia, hesitancy, discharge, hematuria or flank pain. Musculoskeletal: Denies arthralgias, myalgias, stiffness, jt. swelling, pain, limping or strain/sprain.  Skin: Denies pruritus, rash, hives, warts, acne, eczema or change in skin lesion(s). Neuro: No weakness, tremor, incoordination, spasms, paresthesia or pain. Psychiatric: Denies confusion, memory loss or sensory loss. Endo: Denies change in weight, skin or hair change.  Heme/Lymph: No excessive bleeding, bruising or enlarged lymph  nodes.  Exam:  BP 130/82  Pulse 76  Temp 97.3 F   Resp 16  Ht 5' 3.25"  Wt 219 lb 9.6 oz   BMI 38.57   Appears well nourished and in no distress. Eyes: PERRLA, EOMs, conjunctiva no swelling or erythema. Sinuses: No frontal/maxillary tenderness ENT/Mouth: EAC's clear, TM's nl w/o erythema, bulging. Nares clear w/o erythema, swelling, exudates. Oropharynx clear without erythema or exudates. Oral hygiene is good. Tongue normal, non obstructing. Hearing intact.  Neck: Supple. Thyroid nl. Car 2+/2+ without bruits, nodes or JVD. Chest: Respirations nl with BS clear & equal w/o rales, rhonchi, wheezing or stridor.  Cor: Heart sounds normal w/ regular rate and rhythm without  sig. murmurs, gallops, clicks, or rubs. Peripheral pulses normal and equal  without edema.  Abdomen: Soft & bowel sounds normal. Non-tender w/o guarding, rebound, hernias, masses, or organomegaly.  Lymphatics: Unremarkable.  Musculoskeletal: Full ROM all peripheral extremities, joint stability, 5/5 strength, and normal gait.  Skin: Warm, dry without exposed rashes, lesions or ecchymosis apparent.  Neuro: Cranial nerves intact, reflexes equal bilaterally. Sensory-motor testing grossly intact. Tendon reflexes grossly intact.  Pysch: Alert & oriented x 3.  Insight and judgement nl & appropriate. No ideations.  Assessment and Plan:  1. Hypertension - Continue monitor blood pressure at home. Continue diet/meds same.  2. Hyperlipidemia - Continue diet/meds, exercise,& lifestyle modifications. Continue monitor periodic cholesterol/liver & renal functions   3. T2_NIDDM w/Stage 3 CKD (GFR 33 ml/min) - Continue diet, exercise, lifestyle modifications. Monitor appropriate labs.  4. Vitamin D Deficiency - Continue supplementation.   Recommended regular exercise, BP monitoring, weight control, and discussed med and SE's. Recommended labs to assess and monitor clinical status. Further disposition pending results of labs.

## 2014-06-09 ENCOUNTER — Other Ambulatory Visit: Payer: Self-pay

## 2014-06-09 MED ORDER — FUROSEMIDE 40 MG PO TABS: 40.0000 mg | ORAL_TABLET | Freq: Two times a day (BID) | ORAL | Status: DC

## 2014-06-21 ENCOUNTER — Ambulatory Visit (INDEPENDENT_AMBULATORY_CARE_PROVIDER_SITE_OTHER): Payer: Medicare Other | Admitting: Physician Assistant

## 2014-06-21 ENCOUNTER — Encounter: Payer: Self-pay | Admitting: Physician Assistant

## 2014-06-21 VITALS — BP 132/70 | HR 70 | Temp 98.2°F | Resp 16 | Ht 63.25 in | Wt 226.0 lb

## 2014-06-21 DIAGNOSIS — I10 Essential (primary) hypertension: Secondary | ICD-10-CM

## 2014-06-21 DIAGNOSIS — R42 Dizziness and giddiness: Secondary | ICD-10-CM

## 2014-06-21 DIAGNOSIS — F32A Depression, unspecified: Secondary | ICD-10-CM

## 2014-06-21 DIAGNOSIS — R1013 Epigastric pain: Secondary | ICD-10-CM

## 2014-06-21 DIAGNOSIS — F329 Major depressive disorder, single episode, unspecified: Secondary | ICD-10-CM

## 2014-06-21 DIAGNOSIS — F319 Bipolar disorder, unspecified: Secondary | ICD-10-CM

## 2014-06-21 DIAGNOSIS — R443 Hallucinations, unspecified: Secondary | ICD-10-CM

## 2014-06-21 DIAGNOSIS — R251 Tremor, unspecified: Secondary | ICD-10-CM

## 2014-06-21 NOTE — Patient Instructions (Signed)
Continue medications as prescribed. I will call with results of labs and MRI. If the MRI comes back normal, then we will have to refer you to psychiatrist.    Make sure you are drinking plenty of fluids to stay hydrated.    Ischemic Stroke A stroke (cerebrovascular accident) is the sudden death of brain tissue. It is a medical emergency. A stroke can cause permanent loss of brain function. This can cause problems with different parts of your body. A transient ischemic attack (TIA) is different because it does not cause permanent damage. A TIA is a short-lived problem of poor blood flow affecting a part of the brain. A TIA is also a serious problem because having a TIA greatly increases the chances of having a stroke. When symptoms first develop, you cannot know if the problem might be a stroke or a TIA. CAUSES  A stroke is caused by a decrease of oxygen supply to an area of your brain. It is usually the result of a small blood clot or collection of cholesterol or fat (plaque) that blocks blood flow in the brain. A stroke can also be caused by blocked or damaged carotid arteries.  RISK FACTORS  High blood pressure (hypertension).  High cholesterol.  Diabetes mellitus.  Heart disease.  The buildup of plaque in the blood vessels (peripheral artery disease or atherosclerosis).  The buildup of plaque in the blood vessels providing blood and oxygen to the brain (carotid artery stenosis).  An abnormal heart rhythm (atrial fibrillation).  Obesity.  Smoking.  Taking oral contraceptives (especially in combination with smoking).  Physical inactivity.  A diet high in fats, salt (sodium), and calories.  Alcohol use.  Use of illegal drugs (especially cocaine and methamphetamine).  Being African American.  Being over the age of 44.  Family history of stroke.  Previous history of blood clots, stroke, TIA, or heart attack.  Sickle cell disease. SYMPTOMS  These symptoms usually  develop suddenly, or may be newly present upon awakening from sleep:  Sudden weakness or numbness of the face, arm, or leg, especially on one side of the body.  Sudden trouble walking or difficulty moving arms or legs.  Sudden confusion.  Sudden personality changes.  Trouble speaking (aphasia) or understanding.  Difficulty swallowing.  Sudden trouble seeing in one or both eyes.  Double vision.  Dizziness.  Loss of balance or coordination.  Sudden severe headache with no known cause.  Trouble reading or writing. DIAGNOSIS  Your health care provider can often determine the presence or absence of a stroke based on your symptoms, history, and physical exam. Computed tomography (CT) of the brain is usually performed to confirm the stroke, determine causes, and determine stroke severity. Other tests may be done to find the cause of the stroke. These tests may include:  Electrocardiography.  Continuous heart monitoring.  Echocardiography.  Carotid ultrasonography.  Magnetic resonance imaging (MRI).  A scan of the brain circulation.  Blood tests. PREVENTION  The risk of a stroke can be decreased by appropriately treating high blood pressure, high cholesterol, diabetes, heart disease, and obesity and by quitting smoking, limiting alcohol, and staying physically active. TREATMENT  Time is of the essence. It is important to seek treatment at the first sign of these symptoms because you may receive a medicine to dissolve the clot (thrombolytic) that cannot be given if too much time has passed since your symptoms began. Even if you do not know when your symptoms began, get treatment as soon as  possible as there are other treatment options available including oxygen, intravenous (IV) fluids, and medicines to thin the blood (anticoagulants). Treatment of stroke depends on the duration, severity, and cause of your symptoms. Medicines and dietary changes may be used to address diabetes,  high blood pressure, and other risk factors. Physical, speech, and occupational therapists will assess you and work with you to improve any functions impaired by the stroke. Measures will be taken to prevent short-term and long-term complications, including infection from breathing foreign material into the lungs (aspiration pneumonia), blood clots in the legs, bedsores, and falls. Rarely, surgery may be needed to remove large blood clots or to open up blocked arteries. HOME CARE INSTRUCTIONS   Take medicines only as directed by your health care provider. Follow the directions carefully. Medicines may be used to control risk factors for a stroke. Be sure you understand all your medicine instructions.  You may be told to take a medicine to thin the blood, such as aspirin or the anticoagulant warfarin. Warfarin needs to be taken exactly as instructed.  Too much and too little warfarin are both dangerous. Too much warfarin increases the risk of bleeding. Too little warfarin continues to allow the risk for blood clots. While taking warfarin, you will need to have regular blood tests to measure your blood clotting time. These blood tests usually include both the PT and INR tests. The PT and INR results allow your health care provider to adjust your dose of warfarin. The dose can change for many reasons. It is critically important that you take warfarin exactly as prescribed, and that you have your PT and INR levels drawn exactly as directed.  Many foods, especially foods high in vitamin K, can interfere with warfarin and affect the PT and INR results. Foods high in vitamin K include spinach, kale, broccoli, cabbage, collard and turnip greens, brussels sprouts, peas, cauliflower, seaweed, and parsley, as well as beef and pork liver, green tea, and soybean oil. You should eat a consistent amount of foods high in vitamin K. Avoid major changes in your diet, or notify your health care provider before changing your  diet. Arrange a visit with a dietitian to answer your questions.  Many medicines can interfere with warfarin and affect the PT and INR results. You must tell your health care provider about any and all medicines you take. This includes all vitamins and supplements. Be especially cautious with aspirin and anti-inflammatory medicines. Do not take or discontinue any prescribed or over-the-counter medicine except on the advice of your health care provider or pharmacist.  Warfarin can have side effects, such as excessive bruising or bleeding. You will need to hold pressure over cuts for longer than usual. Your health care provider or pharmacist will discuss other potential side effects.  Avoid sports or activities that may cause injury or bleeding.  Be mindful when shaving, flossing your teeth, or handling sharp objects.  Alcohol can change the body's ability to handle warfarin. It is best to avoid alcoholic drinks or consume only very small amounts while taking warfarin. Notify your health care provider if you change your alcohol intake.  Notify your dentist or other health care providers before procedures.  If swallow studies have determined that your swallowing reflex is present, you should eat healthy foods. Including 5 or more servings of fruits and vegetables a day may reduce the risk of stroke. Foods may need to be a certain consistency (soft or pureed), or small bites may need to be  taken in order to avoid aspirating or choking. Certain dietary changes may be advised to address high blood pressure, high cholesterol, diabetes, or obesity.  Food choices that are low in sodium, saturated fat, trans fat, and cholesterol are recommended to manage high blood pressure.  Food choies that are high in fiber, and low in saturated fat, trans fat, and cholesterol may control cholesterol levels.  Controlling carbohydrates and sugar intake is recommended to manage diabetes.  Reducing calorie intake and  making food choices that are low in sodium, saturated fat, trans fat, and cholesterol are recommended to manage obesity.  Maintain a healthy weight.  Stay physically active. It is recommended that you get at least 30 minutes of activity on all or most days.  Do not use any tobacco products including cigarettes, chewing tobacco, or electronic cigarettes.  Limit alcohol use even if you are not taking warfarin. Moderate alcohol use is considered to be:  No more than 2 drinks each day for men.  No more than 1 drink each day for nonpregnant women.  Home safety. A safe home environment is important to reduce the risk of falls. Your health care provider may arrange for specialists to evaluate your home. Having grab bars in the bedroom and bathroom is often important. Your health care provider may arrange for equipment to be used at home, such as raised toilets and a seat for the shower.  Physical, occupational, and speech therapy. Ongoing therapy may be needed to maximize your recovery after a stroke. If you have been advised to use a walker or a cane, use it at all times. Be sure to keep your therapy appointments.  Follow all instructions for follow-up with your health care provider. This is very important. This includes any referrals, physical therapy, rehabilitation, and lab tests. Proper follow-up can prevent another stroke from occurring. SEEK MEDICAL CARE IF:  You have personality changes.  You have difficulty swallowing.  You are seeing double.  You have dizziness.  You have a fever.  You have skin breakdown. SEEK IMMEDIATE MEDICAL CARE IF:  Any of these symptoms may represent a serious problem that is an emergency. Do not wait to see if the symptoms will go away. Get medical help right away. Call your local emergency services (911 in [place] Do not drive yourself to the hospital.  You have sudden weakness or numbness of the face, arm, or leg, especially on one side of the  body.  You have sudden trouble walking or difficulty moving arms or legs.  You have sudden confusion.  You have trouble speaking (aphasia) or understanding.  You have sudden trouble seeing in one or both eyes.  You have a loss of balance or coordination.  You have a sudden, severe headache with no known cause.  You have new chest pain or an irregular heartbeat.  You have a partial or total loss of consciousness. Document Released: 07/09/2005 Document Revised: 11/23/2013 Document Reviewed: 02/17/2012 [facility] Patient Information 2015 [place], [place]. This information is not intended to replace advice given to you by your health care provider. Make sure you discuss any questions you have with your health care provider.

## 2014-06-21 NOTE — Progress Notes (Addendum)
Subjective:    Patient ID: Alyssa [name], female    DOB: February 02, 1946, 68 y.o.   MRN: [id number]  HPI Comments: A 68yo Caucasian female presents to the office today due to hallucinations and "jerking" movements in arms and legs that started 1 week ago.  Patient's husband states she states "sees people coming at her and that someone is in the room with her".  He states she had the "twiching and jerking episodes" on January 05, 2014 also.  He took her to the hospital and nothing was found.   Patient had MRI of brain in June 2015 that showed "1. Moderate frontal and mild perisylvian atrophy.  2. Mild-moderate periventricular and subcortical chronic small vessel ischemic disease. 3. No acute findings."   Patient had TSH done on 04/01/14 that was 2.350.  Patient is on Levothyroxine 67mcg daily and patient states she is taking that by itself in the morning. Patient is on Brintellix 10mg  daily for depression.  She told Wells Guiles she is taking the Brintellix, but told me later she has not been taking it. Patient is on Glipizide 5-10mg  BID (Patient only takes if sugar is above 200).  Patient's sugar level this morning was 104.  Patient was given Uloric, phentermine, brintellix as last appointment.  Patient told Wells Guiles she is taking these medications; however, patient told me she is NOT taking phentermine or brintellix.  She states she did recently take a whole pill of trazodone the last couple of nights instead of her usual 1/2 pill at night.   Patient does admit to 1 glass of wine and cocktail every couple of days.  She states she is a social drinker.  Review of Systems  Constitutional: Positive for fatigue. Negative for fever, chills and diaphoresis.  HENT: Negative.   Eyes: Negative.   Respiratory: Negative.   Cardiovascular: Negative.   Gastrointestinal: Positive for abdominal pain. Negative for nausea, vomiting, diarrhea and constipation.       Abdominal pain in epigastric area that goes straight  through to back.  Genitourinary: Negative.   Musculoskeletal: Negative.   Skin: Negative.  Negative for rash.  Neurological: Positive for dizziness. Negative for light-headedness and headaches.  Psychiatric/Behavioral: Positive for hallucinations and confusion. The patient is nervous/anxious.        Stress and depression   Past Medical History  Diagnosis Date  . Osteopenia   . Osteoporosis   . Diabetes mellitus without complication   . Hypertension   . Bipolar 1 disorder   . Hyperlipidemia   . Anemia   . Cataract   . Unspecified vitamin D deficiency   . Glaucoma   . Chronic back pain   . Hypothyroidism    Current Outpatient Prescriptions on File Prior to Visit  Medication Sig Dispense Refill  . aspirin 81 MG tablet Take 81 mg by mouth daily.    . brimonidine (ALPHAGAN) 0.15 % ophthalmic solution     . Cholecalciferol (VITAMIN D-3 PO) Take 5,000 Units by mouth every evening.     . dorzolamide-timolol (COSOPT) 22.3-6.8 MG/ML ophthalmic solution Place 1 drop into both eyes 2 (two) times daily.     . DUREZOL 0.05 % EMUL     . febuxostat (ULORIC) 40 MG tablet Take 1 tablet (40 mg total) by mouth daily. 30 tablet 6  . furosemide (LASIX) 40 MG tablet Take 1 tablet (40 mg total) by mouth 2 (two) times daily. 60 tablet 3  . gabapentin (NEURONTIN) 800 MG tablet TAKE 1 TABLET BY MOUTH FOUR  TIMES DAILY FOR FIBROMYALGIA 120 tablet 3  . glipiZIDE (GLUCOTROL) 10 MG tablet Take 5-10 mg by mouth 2 (two) times daily as needed (only takes if needed, depending on glucose level >200).     Marland Kitchen latanoprost (XALATAN) 0.005 % ophthalmic solution     . levothyroxine (SYNTHROID) 50 MCG tablet Take 1 tablet (50 mcg total) by mouth daily before breakfast. 90 tablet 2  . losartan (COZAAR) 100 MG tablet Take 1 tablet (100 mg total) by mouth daily. 90 tablet 1  . MAGNESIUM-OXIDE 400 (241.3 MG) MG tablet TAKE 1 TABLET BY MOUTH TWICE DAILY 180 tablet 99  . metFORMIN (GLUCOPHAGE-XR) 500 MG 24 hr tablet TAKE 2  TABLETS BY MOUTH TWICE DAILY FOR DIABETES 360 tablet 2  . minoxidil (LONITEN) 10 MG tablet Take 1/2 to 1 Tablet daily as directed for BP 90 tablet 0  . Multiple Vitamin (MULTIVITAMIN) tablet Take 1 tablet by mouth daily.    . Omega-3 Fatty Acids (FISH OIL) 1000 MG CAPS Take 1,000 mg by mouth every evening.     . phentermine (ADIPEX-P) 37.5 MG tablet Take 1/2 to 1 tablet daily for weight loss 30 tablet 2  . Probiotic Product (PROBIOTIC DAILY PO) Take 1 tablet by mouth every morning.     Marland Kitchen PROLENSA 0.07 % SOLN     . rosuvastatin (CRESTOR) 5 MG tablet Take 1 tablet (5 mg total) by mouth daily. 28 tablet 0  . traZODone (DESYREL) 150 MG tablet TAKE 1/3 TO 1/2 TO 1 TABLET BY MOUTH EVERY NIGHT AT BEDTIME AS NEEDED FOR SLEEP 90 tablet 0  . vitamin B-12 (CYANOCOBALAMIN) 1000 MCG tablet Take 1,000 mcg by mouth daily.    . Vortioxetine HBr (BRINTELLIX) 10 MG TABS Take 1 tablet (10 mg total) by mouth daily. 42 tablet 0   No current facility-administered medications on file prior to visit.   Allergies  Allergen Reactions  . Ciprofloxacin Hcl Diarrhea  . Cymbalta [Duloxetine Hcl] Other (See Comments)    Per pt: it did not work  . Codeine Other (See Comments)    Per pt: unknown  . Lortab [Hydrocodone-Acetaminophen] Other (See Comments)    Per pt: unknown  . Oxycodone Other (See Comments)    Per pt: unknown     BP 132/70 mmHg  Pulse 70  Temp(Src) 98.2 F (36.8 C) (Temporal)  Resp 16  Ht 5' 3.25" (1.607 m)  Wt 226 lb (102.513 kg)  BMI 39.70 kg/m2  SpO2 92% Wt Readings from Last 3 Encounters:  06/21/14 226 lb (102.513 kg)  05/18/14 219 lb 9.6 oz (99.61 kg)  04/01/14 218 lb (98.884 kg)   Objective:   Physical Exam  Constitutional: She is oriented to person, place, and time. She appears well-developed and well-nourished. She does not have a sickly appearance. She does not appear ill. No distress.  HENT:  Head: Normocephalic.  Right Ear: Tympanic membrane, external ear and ear canal normal.   Left Ear: Tympanic membrane, external ear and ear canal normal.  Nose: Nose normal. Right sinus exhibits no maxillary sinus tenderness and no frontal sinus tenderness. Left sinus exhibits no maxillary sinus tenderness and no frontal sinus tenderness.  Mouth/Throat: Uvula is midline, oropharynx is clear and moist and mucous membranes are normal. Mucous membranes are not pale and not dry. No trismus in the jaw. No uvula swelling. No oropharyngeal exudate, posterior oropharyngeal edema, posterior oropharyngeal erythema or tonsillar abscesses.  Eyes: Conjunctivae, EOM and lids are normal. Pupils are equal, round, and reactive to  light. Right eye exhibits no discharge. Left eye exhibits no discharge. No scleral icterus.  Neck: Trachea normal, normal range of motion and phonation normal. Neck supple. No tracheal tenderness present. No tracheal deviation present.  Cardiovascular: Normal rate, regular rhythm, S1 normal, S2 normal, normal heart sounds, intact distal pulses and normal pulses.  Exam reveals no gallop, no distant heart sounds and no friction rub.   No murmur heard. Pulmonary/Chest: Effort normal and breath sounds normal. No stridor. No respiratory distress. She has no decreased breath sounds. She has no wheezes. She has no rhonchi. She has no rales. She exhibits no tenderness.  Abdominal: Soft. Bowel sounds are normal. She exhibits no distension. There is generalized tenderness. There is no rebound and no guarding.  Lymphadenopathy:  No tenderness or LAD.  Neurological: She is alert and oriented to person, place, and time. She has normal strength and normal reflexes. No cranial nerve deficit. Gait normal.  Some unsteadiness during Romberg test.  Negative pronator drift.    Skin: Skin is warm, dry and intact. No rash noted. She is not diaphoretic.  Psychiatric: Her speech is normal. Judgment and thought content normal. Her affect is blunt. She is withdrawn. She is not actively hallucinating.   Vitals reviewed.     Assessment & Plan:  1. Dizziness and giddiness and Hallucinations Ordered labs to R/O UTI, electrolyte abnormality, thyroid dysfunction. Ordered MRI Brain to rule out tumor. - Urine culture - Urinalysis, Routine w reflex microscopic - CBC with Differential - BASIC METABOLIC PANEL WITH GFR - Hepatic function panel - TSH - MR Brain Wo Contrast; Future  2. Epigastric pain Ordered labs to R/O Pancreatitis - Amylase - Lipase  3. Essential hypertension Continue medications as prescribed.  Please monitor BP at home.  Reminder to go to the ER if any CP, SOB, nausea, dizziness, severe HA, changes vision/speech, left arm numbness and tingling and jaw pain.  - CBC with Differential - BASIC METABOLIC PANEL WITH GFR - Hepatic function panel  4. Bipolar 1 disorder If MRI of brain comes back normal, then will refer to psychiatrist for hallucinations.  5. Tremor Ordered labs and will monitor. - CBC with Differential - BASIC METABOLIC PANEL WITH GFR - Hepatic function panel - MR Brain Wo Contrast; Future  6. Depression Patient states she is not currently taking and told patient to not take the Brintellix at this time.    Do NOT take phentermine (weight loss pill).   Only take 1/2 pill of Trazodone at bedtime.  Continue all other medication as prescribed.  Pt expressed understanding to directions. Discussed medication effects and SE's.  Pt agreed to treatment plan. Please keep your physical appt on 07/20/14.  Addendum: Spoke with Dr. Melford Aase and Vicie Mutters, PA-C and they both agreed that patient should see psychiatrist as soon as possible and not wait until after the MRI.  They both agreed that she is a complicated case and that the psychiatrist will be better to help manage her depression, anxiety and stress.  Jareli Highland, Stephani Police, PA-C 4:58 PM [facility]

## 2014-06-22 ENCOUNTER — Other Ambulatory Visit: Payer: Self-pay | Admitting: Physician Assistant

## 2014-06-22 ENCOUNTER — Other Ambulatory Visit: Payer: Self-pay | Admitting: *Deleted

## 2014-06-22 LAB — URINALYSIS, ROUTINE W REFLEX MICROSCOPIC
BILIRUBIN URINE: NEGATIVE
GLUCOSE, UA: NEGATIVE mg/dL
Hgb urine dipstick: NEGATIVE
KETONES UR: NEGATIVE mg/dL
Nitrite: NEGATIVE
PROTEIN: NEGATIVE mg/dL
SPECIFIC GRAVITY, URINE: 1.011 (ref 1.005–1.030)
UROBILINOGEN UA: 0.2 mg/dL (ref 0.0–1.0)
pH: 7 (ref 5.0–8.0)

## 2014-06-22 LAB — CBC WITH DIFFERENTIAL/PLATELET
Basophils Absolute: 0 10*3/uL (ref 0.0–0.1)
Basophils Relative: 0 % (ref 0–1)
EOS PCT: 2 % (ref 0–5)
Eosinophils Absolute: 0.1 10*3/uL (ref 0.0–0.7)
HCT: 34.3 % — ABNORMAL LOW (ref 36.0–46.0)
Hemoglobin: 11 g/dL — ABNORMAL LOW (ref 12.0–15.0)
LYMPHS PCT: 26 % (ref 12–46)
Lymphs Abs: 1.5 10*3/uL (ref 0.7–4.0)
MCH: 28.4 pg (ref 26.0–34.0)
MCHC: 32.1 g/dL (ref 30.0–36.0)
MCV: 88.6 fL (ref 78.0–100.0)
MONOS PCT: 6 % (ref 3–12)
MPV: 10.4 fL (ref 9.4–12.4)
Monocytes Absolute: 0.3 10*3/uL (ref 0.1–1.0)
Neutro Abs: 3.8 10*3/uL (ref 1.7–7.7)
Neutrophils Relative %: 66 % (ref 43–77)
PLATELETS: 286 10*3/uL (ref 150–400)
RBC: 3.87 MIL/uL (ref 3.87–5.11)
RDW: 14.3 % (ref 11.5–15.5)
WBC: 5.7 10*3/uL (ref 4.0–10.5)

## 2014-06-22 LAB — HEPATIC FUNCTION PANEL
ALT: 8 U/L (ref 0–35)
AST: 11 U/L (ref 0–37)
Albumin: 4 g/dL (ref 3.5–5.2)
Alkaline Phosphatase: 53 U/L (ref 39–117)
BILIRUBIN DIRECT: 0.1 mg/dL (ref 0.0–0.3)
BILIRUBIN INDIRECT: 0.3 mg/dL (ref 0.2–1.2)
BILIRUBIN TOTAL: 0.4 mg/dL (ref 0.2–1.2)
Total Protein: 6.6 g/dL (ref 6.0–8.3)

## 2014-06-22 LAB — BASIC METABOLIC PANEL WITH GFR
BUN: 56 mg/dL — ABNORMAL HIGH (ref 6–23)
CALCIUM: 9.9 mg/dL (ref 8.4–10.5)
CO2: 36 mEq/L — ABNORMAL HIGH (ref 19–32)
Chloride: 95 mEq/L — ABNORMAL LOW (ref 96–112)
Creat: 1.96 mg/dL — ABNORMAL HIGH (ref 0.50–1.10)
GFR, Est African American: 29 mL/min — ABNORMAL LOW
GFR, Est Non African American: 26 mL/min — ABNORMAL LOW
Glucose, Bld: 270 mg/dL — ABNORMAL HIGH (ref 70–99)
Potassium: 4.9 mEq/L (ref 3.5–5.3)
SODIUM: 138 meq/L (ref 135–145)

## 2014-06-22 LAB — TSH: TSH: 1.225 u[IU]/mL (ref 0.350–4.500)

## 2014-06-22 LAB — URINALYSIS, MICROSCOPIC ONLY
CASTS: NONE SEEN
Crystals: NONE SEEN
SQUAMOUS EPITHELIAL / LPF: NONE SEEN

## 2014-06-22 LAB — AMYLASE: Amylase: 20 U/L (ref 0–105)

## 2014-06-22 LAB — LIPASE: LIPASE: 15 U/L (ref 0–75)

## 2014-06-22 MED ORDER — FEBUXOSTAT 40 MG PO TABS: 40.0000 mg | ORAL_TABLET | Freq: Every day | ORAL | Status: DC

## 2014-06-23 ENCOUNTER — Encounter: Payer: Self-pay | Admitting: Emergency Medicine

## 2014-06-23 LAB — URINE CULTURE: Colony Count: 15000

## 2014-06-24 ENCOUNTER — Encounter: Payer: Self-pay | Admitting: Physician Assistant

## 2014-06-24 ENCOUNTER — Ambulatory Visit (INDEPENDENT_AMBULATORY_CARE_PROVIDER_SITE_OTHER): Payer: Medicare Other | Admitting: Physician Assistant

## 2014-06-24 VITALS — BP 126/68 | HR 80 | Temp 98.2°F | Resp 16 | Ht 63.25 in | Wt 224.0 lb

## 2014-06-24 DIAGNOSIS — G479 Sleep disorder, unspecified: Secondary | ICD-10-CM

## 2014-06-24 DIAGNOSIS — IMO0001 Reserved for inherently not codable concepts without codable children: Secondary | ICD-10-CM

## 2014-06-24 DIAGNOSIS — E785 Hyperlipidemia, unspecified: Secondary | ICD-10-CM

## 2014-06-24 DIAGNOSIS — N183 Chronic kidney disease, stage 3 unspecified: Secondary | ICD-10-CM | POA: Insufficient documentation

## 2014-06-24 DIAGNOSIS — F329 Major depressive disorder, single episode, unspecified: Secondary | ICD-10-CM

## 2014-06-24 DIAGNOSIS — E538 Deficiency of other specified B group vitamins: Secondary | ICD-10-CM

## 2014-06-24 DIAGNOSIS — N184 Chronic kidney disease, stage 4 (severe): Secondary | ICD-10-CM

## 2014-06-24 DIAGNOSIS — E559 Vitamin D deficiency, unspecified: Secondary | ICD-10-CM

## 2014-06-24 DIAGNOSIS — E1165 Type 2 diabetes mellitus with hyperglycemia: Secondary | ICD-10-CM

## 2014-06-24 DIAGNOSIS — Z79899 Other long term (current) drug therapy: Secondary | ICD-10-CM

## 2014-06-24 DIAGNOSIS — I10 Essential (primary) hypertension: Secondary | ICD-10-CM

## 2014-06-24 DIAGNOSIS — M797 Fibromyalgia: Secondary | ICD-10-CM

## 2014-06-24 DIAGNOSIS — M109 Gout, unspecified: Secondary | ICD-10-CM

## 2014-06-24 DIAGNOSIS — F32A Depression, unspecified: Secondary | ICD-10-CM

## 2014-06-24 DIAGNOSIS — IMO0002 Reserved for concepts with insufficient information to code with codable children: Secondary | ICD-10-CM

## 2014-06-24 MED ORDER — VORTIOXETINE HBR 10 MG PO TABS: ORAL_TABLET | ORAL | Status: DC

## 2014-06-24 MED ORDER — METFORMIN HCL ER 500 MG PO TB24: 500.0000 mg | ORAL_TABLET | Freq: Two times a day (BID) | ORAL | Status: DC

## 2014-06-24 MED ORDER — FUROSEMIDE 40 MG PO TABS: ORAL_TABLET | ORAL | Status: DC

## 2014-06-24 MED ORDER — MINOXIDIL 10 MG PO TABS: ORAL_TABLET | ORAL | Status: DC

## 2014-06-24 MED ORDER — ROSUVASTATIN CALCIUM 5 MG PO TABS: 5.0000 mg | ORAL_TABLET | Freq: Every day | ORAL | Status: DC

## 2014-06-24 MED ORDER — FEBUXOSTAT 40 MG PO TABS: 40.0000 mg | ORAL_TABLET | Freq: Every day | ORAL | Status: DC

## 2014-06-24 MED ORDER — GABAPENTIN 800 MG PO TABS: 800.0000 mg | ORAL_TABLET | Freq: Two times a day (BID) | ORAL | Status: DC

## 2014-06-24 NOTE — Addendum Note (Signed)
Addended by: Charolette Forward on: 06/24/2014 09:02 PM   Modules accepted: Orders

## 2014-06-24 NOTE — Progress Notes (Signed)
CC: Medication Reconciliation  HPI A 68 yo Caucasian female presents to the office today for medication reconciliation due to Chronic kidney disease Stage 4 with GFR of 26 on 06/21/14.  Patient is accompanied by her husband.  Patient did bring all medications with her to her appointment.  Patient states she is feeling better since last appointment.  She states she is not having hallucinations anymore; however, states "the carpet has vibrant colors or is colorful".    BMP Latest Ref Rng 06/21/2014 04/01/2014 02/04/2014  Glucose 70 - 99 mg/dL 270(H) 109(H) 114(H)  BUN 6 - 23 mg/dL 56(H) 32(H) 35(H)  Creatinine 0.50 - 1.10 mg/dL 1.96(H) 1.60(H) 1.61(H)  Sodium 135 - 145 mEq/L 138 141 141  Potassium 3.5 - 5.3 mEq/L 4.9 4.0 4.2  Chloride 96 - 112 mEq/L 95(L) 98 102  CO2 19 - 32 mEq/L 36(H) 31 32  Calcium 8.4 - 10.5 mg/dL 9.9 10.9(H) 9.9  Estimated Creatinine Clearance: 31 mL/min (by C-G formula based on Cr of 1.96).  GFR = 26 on 06/21/14 GFR = 33 on 04/01/14 GFR = 32 on 02/04/14 GFR = 38 on 12/28/13 GFR = 37 on 10/29/13  Need to decrease Lasix 40mg  to 1 tablet daily Need to decrease Gabapentin 800mg  to 1 tablet twice a day. Need to decrease Metformin XR to 1 tablet twice a day.  Past Medical History  Diagnosis Date  . Osteopenia   . Osteoporosis   . Diabetes mellitus without complication   . Hypertension   . Bipolar 1 disorder   . Hyperlipidemia   . Anemia   . Cataract   . Unspecified vitamin D deficiency   . Glaucoma   . Chronic back pain   . Hypothyroidism    Current Outpatient Prescriptions on File Prior to Visit  Medication Sig Dispense Refill  . aspirin 81 MG tablet Take 81 mg by mouth daily.    . Cholecalciferol (VITAMIN D-3 PO) Take 5,000 Units by mouth every evening.     . DUREZOL 0.05 % EMUL     . febuxostat (ULORIC) 40 MG tablet Take 1 tablet (40 mg total) by mouth daily. 14 tablet 0  . furosemide (LASIX) 40 MG tablet Take 1 tablet (40 mg total) by mouth 2 (two) times  daily. 60 tablet 3  . gabapentin (NEURONTIN) 800 MG tablet TAKE 1 TABLET BY MOUTH FOUR TIMES DAILY FOR FIBROMYALGIA 120 tablet 3  . glipiZIDE (GLUCOTROL) 10 MG tablet Take 5-10 mg by mouth 2 (two) times daily as needed (only takes if needed, depending on glucose level >200).     Marland Kitchen levothyroxine (SYNTHROID) 50 MCG tablet Take 1 tablet (50 mcg total) by mouth daily before breakfast. 90 tablet 2  . losartan (COZAAR) 100 MG tablet Take 1 tablet (100 mg total) by mouth daily. 90 tablet 1  . MAGNESIUM-OXIDE 400 (241.3 MG) MG tablet TAKE 1 TABLET BY MOUTH TWICE DAILY 180 tablet 99  . metFORMIN (GLUCOPHAGE-XR) 500 MG 24 hr tablet TAKE 2 TABLETS BY MOUTH TWICE DAILY FOR DIABETES 360 tablet 2  . minoxidil (LONITEN) 10 MG tablet Take 1/2 to 1 Tablet daily as directed for BP 90 tablet 0  . Multiple Vitamin (MULTIVITAMIN) tablet Take 1 tablet by mouth daily.    . Omega-3 Fatty Acids (FISH OIL) 1000 MG CAPS Take 1,000 mg by mouth every evening.     . Probiotic Product (PROBIOTIC DAILY PO) Take 1 tablet by mouth every morning.     . rosuvastatin (CRESTOR) 5 MG tablet  Take 1 tablet (5 mg total) by mouth daily. 28 tablet 0  . traZODone (DESYREL) 150 MG tablet TAKE 1/3 TO 1/2 TO 1 TABLET BY MOUTH EVERY NIGHT AT BEDTIME AS NEEDED FOR SLEEP 90 tablet 0  . vitamin B-12 (CYANOCOBALAMIN) 1000 MCG tablet Take 1,000 mcg by mouth daily.    . brimonidine (ALPHAGAN) 0.15 % ophthalmic solution     . dorzolamide-timolol (COSOPT) 22.3-6.8 MG/ML ophthalmic solution Place 1 drop into both eyes 2 (two) times daily.     Marland Kitchen latanoprost (XALATAN) 0.005 % ophthalmic solution     . phentermine (ADIPEX-P) 37.5 MG tablet Take 1/2 to 1 tablet daily for weight loss (Patient not taking: Reported on 06/24/2014) 30 tablet 2  . PROLENSA 0.07 % SOLN     . Vortioxetine HBr (BRINTELLIX) 10 MG TABS Take 1 tablet (10 mg total) by mouth daily. (Patient not taking: Reported on 06/24/2014) 42 tablet 0   No current facility-administered medications on  file prior to visit.   Allergies  Allergen Reactions  . Ciprofloxacin Hcl Diarrhea  . Cymbalta [Duloxetine Hcl] Other (See Comments)    Per pt: it did not work  . Codeine Other (See Comments)    Per pt: unknown  . Lortab [Hydrocodone-Acetaminophen] Other (See Comments)    Per pt: unknown  . Oxycodone Other (See Comments)    Per pt: unknown   Physical Exam BP 126/68 mmHg  Pulse 80  Temp(Src) 98.2 F (36.8 C) (Temporal)  Resp 16  Ht 5' 3.25" (1.607 m)  Wt 224 lb (101.606 kg)  BMI 39.34 kg/m2  SpO2 95% Wt Readings from Last 3 Encounters:  06/24/14 224 lb (101.606 kg)  06/21/14 226 lb (102.513 kg)  05/18/14 219 lb 9.6 oz (99.61 kg)  Vitals Reviewed.- No physical exam performed. General Appearance: Well nourished, in no apparent distress.  Obese. Gait was antalgic.   Psych: Insight and Judgment appropriate.   We went over each medication one at a time.  Patient stated how much she was taking for each medication and if she has been taking each medication.     Assessment and Plan OVER 40 minutes of counseling, chart review, referral performed to psychiatrist.  1. Chronic kidney disease, stage 4, severely decreased GFR Spoke with patient for over 40 minutes about chronic kidney disease and the need to adjust medications.  Patient expressed understanding.    2. Fibromyalgia Take Gabapentin 800mg - Take 1 tablet two times a day.- gabapentin (NEURONTIN) 800 MG tablet; Take 1 tablet (800 mg total) by mouth 2 (two) times daily.  Dispense: 60 tablet; Refill: 0  3. Diabetes type 2, uncontrolled Take Metformin XR 500mg - Take 1 tablet two times a day with meals.- metFORMIN (GLUCOPHAGE-XR) 500 MG 24 hr tablet; Take 1 tablet (500 mg total) by mouth 2 (two) times daily with a meal.  Dispense: 60 tablet; Refill: 0 Continue Glipizide-Take 5-10 mg by mouth 2 (two) times daily as needed (only takes if needed, depending on glucose level >200).   4. Essential hypertension Take Lasix 40mg - take  1/2 tablet in the morning and 1/2 tablet in the evening.- furosemide (LASIX) 40 MG tablet; Take 1/2 tablet PO in the morning and 1/2 tablet PO in the evening.  Dispense: 15 tablet; Refill: 1 Continue Minoxidil 10mg - Take 1 tablet by mouth daily- minoxidil (LONITEN) 10 MG tablet; Take 1 Tablet daily as directed for BP  Dispense: 90 tablet; Refill: 0  5. Depression Take Brintellix 10mg - Take 1/2 tablet daily  - Vortioxetine HBr (  BRINTELLIX) 10 MG TABS; Take 1/2 tablet PO daily.  Dispense: 28 tablet; Refill: 0 -It was highly recommended to patient that she see psychiatrist at [facility].  Gave patient their information and encourage her to contact them.  Told patient that we have tried all the medication we can for her depression.  Expressed to patient that they know more about depression medications and medications that can help with hallucinations if they continue.  Will counsel patient at next appt in 2 weeks.  6. Hyperlipidemia Continue Crestor 5mg - 1 tablet daily- rosuvastatin (CRESTOR) 5 MG tablet; Take 1 tablet (5 mg total) by mouth daily.  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  Dispense: 7 tablet; Refill: 0  8. Trouble Sleeping Take Trazodone 150mg - ONLY take 1/2 tablet at bedtime.  Do NOT take 1 whole tablet.  Do NOT take the Phentermine medication for weight loss.  Continue Aspirin 81mg - 1 tablet per day in the morning. Continue Vitamin D3- 5,000 IU in the evening Continue Vitamin B12- 1,000IU daily Continue Levothyroxine 46mcg- 1 tablet by mouth daily before breakfast.  Can take in the early morning when urinating at 4am. Continue Losartan 100mg - take 1 tablet by mouth daily. Continue Magnesium-Oxide 400mg - Take 1 tablet by mouth twice daily. Continue Fish Oil 1,000mg - take 1 capsule  in evening Continue Probiotic- 1 tablet in the morning  Discussed medication effects and SE's.  Pt agreed to treatment plan. Please follow up in 2 weeks with Lab appt on Monday July 12, 2014 and follow up on July 14, 2014.  Skippy Marhefka, Stephani Police, PA-C 1:32 PM [facility]

## 2014-06-24 NOTE — Patient Instructions (Addendum)
Please follow up in 2 weeks with Lab appt on Monday December 21 and follow up on December 23.  Take Furosemide/Lasix 40mg - take 1/2 tablet in the morning and 1/2 tablet in the evening. Take Gabapentin/Neurontin 800mg - Take 1 tablet two times a day. Take Glucophage/Metformin XR 500mg - Take 1 tablet two times a day with meals. DO NOT take the Phentermine for weight loss Take Trazodone/Desyrel 150mg - ONLY take 1/2 tablet at bedtime.  Do NOT take 1 whole tablet. Brintellix 10mg - Take 1/2 tablet daily    Please call the eye doctor and ask about your eye medication, especially  Dorzolamide HCL-Timolol Mal Solution.  Should avoid use due to low kidney function.   Recommend that you see [facility] to help manage your depression, anxiety and stress better. Address: [address] [place], [place] [address] Phone-[phone]  Chronic Kidney Disease Chronic kidney disease occurs when the kidneys are damaged over a long period. The kidneys are two organs that lie on either side of the spine between the middle of the back and the front of the abdomen. The kidneys:   Remove wastes and extra water from the blood.   Produce important hormones. These help keep bones strong, regulate blood pressure, and help create red blood cells.   Balance the fluids and chemicals in the blood and tissues. A small amount of kidney damage may not cause problems, but a large amount of damage may make it difficult or impossible for the kidneys to work the way they should. If steps are not taken to slow down the kidney damage or stop it from getting worse, the kidneys may stop working permanently. Most of the time, chronic kidney disease does not go away. However, it can often be controlled, and those with the disease can usually live normal lives. CAUSES  The most common causes of chronic kidney disease are diabetes and high blood pressure (hypertension). Chronic kidney disease may also be caused by:    Diseases that cause the kidneys' filters to become inflamed.   Diseases that affect the immune system.   Genetic diseases.   Medicines that damage the kidneys, such as anti-inflammatory medicines.  Poisoning or exposure to toxic substances.   A reoccurring kidney or urinary infection.   A problem with urine flow. This may be caused by:   Cancer.   Kidney stones.   An enlarged prostate in males. SIGNS AND SYMPTOMS  Because the kidney damage in chronic kidney disease occurs slowly, symptoms develop slowly and may not be obvious until the kidney damage becomes severe. A person may have a kidney disease for years without showing any symptoms. Symptoms can include:   Swelling (edema) of the legs, ankles, or feet.   Tiredness (lethargy).   Nausea or vomiting.   Confusion.   Problems with urination, such as:   Decreased urine production.   Frequent urination, especially at night.   Frequent accidents in children who are potty trained.   Muscle twitches and cramps.   Shortness of breath.  Weakness.   Persistent itchiness.   Loss of appetite.  Metallic taste in the mouth.  Trouble sleeping.  Slowed development in children.  Short stature in children. DIAGNOSIS  Chronic kidney disease may be detected and diagnosed by tests, including blood, urine, imaging, or kidney biopsy tests.  TREATMENT  Most chronic kidney diseases cannot be cured. Treatment usually involves relieving symptoms and preventing or slowing the progression of the disease. Treatment may include:  A special diet. You may need to avoid alcohol and foods thatare salty and high in potassium.   Medicines. These may:   Lower blood pressure.   Relieve anemia.   Relieve swelling.   Protect the bones. HOME CARE INSTRUCTIONS   Follow your prescribed diet.   Take medicines only as directed by your health care provider. Do not take any new medicines (prescription,  over-the-counter, or nutritional supplements) unless approved by your health care provider. Many medicines can worsen your kidney damage or need to have the dose adjusted.   Quit smoking if you smoke. Talk to your health care provider about a smoking cessation program.   Keep all follow-up visits as directed by your health care provider. SEEK IMMEDIATE MEDICAL CARE IF:  Your symptoms get worse or you develop new symptoms.   You develop symptoms of end-stage kidney disease. These include:   Headaches.   Abnormally dark or light skin.   Numbness in the hands or feet.   Easy bruising.   Frequent hiccups.   Menstruation stops.   You have a fever.   You have decreased urine production.   You havepain or bleeding when urinating. MAKE SURE YOU:  Understand these instructions.  Will watch your condition.  Will get help right away if you are not doing well or get worse. FOR MORE INFORMATION   American Association of Kidney Patients: BombTimer.gl  National Kidney Foundation: www.kidney.[place]: https://mathis.com/  [facility]: www.lifeoptions.org and www.kidneyschool.org Document Released: 04/17/2008 Document Revised: 11/23/2013 Document Reviewed: 03/07/2012 [facility] Patient Information 2015 [place], [place]. This information is not intended to replace advice given to you by your health care provider. Make sure you discuss any questions you have with your health care provider.

## 2014-07-12 ENCOUNTER — Other Ambulatory Visit: Payer: Medicare Other

## 2014-07-12 ENCOUNTER — Other Ambulatory Visit: Payer: Self-pay | Admitting: Physician Assistant

## 2014-07-12 DIAGNOSIS — E559 Vitamin D deficiency, unspecified: Secondary | ICD-10-CM

## 2014-07-12 DIAGNOSIS — E1165 Type 2 diabetes mellitus with hyperglycemia: Secondary | ICD-10-CM

## 2014-07-12 DIAGNOSIS — I10 Essential (primary) hypertension: Secondary | ICD-10-CM

## 2014-07-12 DIAGNOSIS — Z79899 Other long term (current) drug therapy: Secondary | ICD-10-CM

## 2014-07-12 DIAGNOSIS — R443 Hallucinations, unspecified: Secondary | ICD-10-CM

## 2014-07-12 DIAGNOSIS — R251 Tremor, unspecified: Secondary | ICD-10-CM

## 2014-07-12 DIAGNOSIS — R42 Dizziness and giddiness: Secondary | ICD-10-CM

## 2014-07-12 DIAGNOSIS — E538 Deficiency of other specified B group vitamins: Secondary | ICD-10-CM

## 2014-07-12 DIAGNOSIS — IMO0002 Reserved for concepts with insufficient information to code with codable children: Secondary | ICD-10-CM

## 2014-07-12 DIAGNOSIS — IMO0001 Reserved for inherently not codable concepts without codable children: Secondary | ICD-10-CM

## 2014-07-12 DIAGNOSIS — N184 Chronic kidney disease, stage 4 (severe): Secondary | ICD-10-CM

## 2014-07-12 LAB — MICROALBUMIN / CREATININE URINE RATIO
Creatinine, Urine: 55 mg/dL
MICROALB/CREAT RATIO: 596.4 mg/g — AB (ref 0.0–30.0)
Microalb, Ur: 32.8 mg/dL — ABNORMAL HIGH (ref <2.0)

## 2014-07-12 LAB — MAGNESIUM: Magnesium: 2 mg/dL (ref 1.5–2.5)

## 2014-07-12 LAB — URINALYSIS, ROUTINE W REFLEX MICROSCOPIC
Bilirubin Urine: NEGATIVE
GLUCOSE, UA: NEGATIVE mg/dL
Hgb urine dipstick: NEGATIVE
Ketones, ur: NEGATIVE mg/dL
LEUKOCYTES UA: NEGATIVE
NITRITE: NEGATIVE
Protein, ur: 30 mg/dL — AB
SPECIFIC GRAVITY, URINE: 1.01 (ref 1.005–1.030)
Urobilinogen, UA: 0.2 mg/dL (ref 0.0–1.0)
pH: 6 (ref 5.0–8.0)

## 2014-07-12 LAB — CBC WITH DIFFERENTIAL/PLATELET
BASOS ABS: 0 10*3/uL (ref 0.0–0.1)
Basophils Relative: 1 % (ref 0–1)
Eosinophils Absolute: 0.1 10*3/uL (ref 0.0–0.7)
Eosinophils Relative: 3 % (ref 0–5)
HEMATOCRIT: 35 % — AB (ref 36.0–46.0)
Hemoglobin: 11.6 g/dL — ABNORMAL LOW (ref 12.0–15.0)
LYMPHS PCT: 28 % (ref 12–46)
Lymphs Abs: 1.2 10*3/uL (ref 0.7–4.0)
MCH: 29.2 pg (ref 26.0–34.0)
MCHC: 33.1 g/dL (ref 30.0–36.0)
MCV: 88.2 fL (ref 78.0–100.0)
MONO ABS: 0.2 10*3/uL (ref 0.1–1.0)
MPV: 10.3 fL (ref 9.4–12.4)
Monocytes Relative: 5 % (ref 3–12)
Neutro Abs: 2.6 10*3/uL (ref 1.7–7.7)
Neutrophils Relative %: 63 % (ref 43–77)
PLATELETS: 251 10*3/uL (ref 150–400)
RBC: 3.97 MIL/uL (ref 3.87–5.11)
RDW: 14.3 % (ref 11.5–15.5)
WBC: 4.2 10*3/uL (ref 4.0–10.5)

## 2014-07-12 LAB — URINALYSIS, MICROSCOPIC ONLY
Bacteria, UA: NONE SEEN
CRYSTALS: NONE SEEN
Casts: NONE SEEN
SQUAMOUS EPITHELIAL / LPF: NONE SEEN

## 2014-07-12 LAB — HEPATIC FUNCTION PANEL
ALK PHOS: 50 U/L (ref 39–117)
ALT: 9 U/L (ref 0–35)
AST: 15 U/L (ref 0–37)
Albumin: 4.3 g/dL (ref 3.5–5.2)
BILIRUBIN DIRECT: 0.2 mg/dL (ref 0.0–0.3)
BILIRUBIN INDIRECT: 0.4 mg/dL (ref 0.2–1.2)
BILIRUBIN TOTAL: 0.6 mg/dL (ref 0.2–1.2)
TOTAL PROTEIN: 6.8 g/dL (ref 6.0–8.3)

## 2014-07-12 LAB — BASIC METABOLIC PANEL WITH GFR
BUN: 31 mg/dL — AB (ref 6–23)
CHLORIDE: 99 meq/L (ref 96–112)
CO2: 33 mEq/L — ABNORMAL HIGH (ref 19–32)
Calcium: 10 mg/dL (ref 8.4–10.5)
Creat: 1.42 mg/dL — ABNORMAL HIGH (ref 0.50–1.10)
GFR, EST NON AFRICAN AMERICAN: 38 mL/min — AB
GFR, Est African American: 43 mL/min — ABNORMAL LOW
Glucose, Bld: 88 mg/dL (ref 70–99)
Potassium: 3.8 mEq/L (ref 3.5–5.3)
Sodium: 144 mEq/L (ref 135–145)

## 2014-07-12 LAB — VITAMIN B12: Vitamin B-12: >2000 pg/mL — ABNORMAL HIGH (ref 211–911)

## 2014-07-13 LAB — URINE CULTURE
COLONY COUNT: NO GROWTH
ORGANISM ID, BACTERIA: NO GROWTH

## 2014-07-13 LAB — VITAMIN D 25 HYDROXY (VIT D DEFICIENCY, FRACTURES): VIT D 25 HYDROXY: 68 ng/mL (ref 30–100)

## 2014-07-14 ENCOUNTER — Other Ambulatory Visit: Payer: Self-pay | Admitting: Physician Assistant

## 2014-07-14 ENCOUNTER — Ambulatory Visit (INDEPENDENT_AMBULATORY_CARE_PROVIDER_SITE_OTHER): Payer: Medicare Other | Admitting: Physician Assistant

## 2014-07-14 VITALS — BP 122/58 | HR 80 | Temp 98.2°F | Resp 18 | Ht 63.25 in | Wt 221.0 lb

## 2014-07-14 DIAGNOSIS — I10 Essential (primary) hypertension: Secondary | ICD-10-CM

## 2014-07-14 DIAGNOSIS — R809 Proteinuria, unspecified: Secondary | ICD-10-CM

## 2014-07-14 DIAGNOSIS — M109 Gout, unspecified: Secondary | ICD-10-CM

## 2014-07-14 DIAGNOSIS — N183 Chronic kidney disease, stage 3 unspecified: Secondary | ICD-10-CM

## 2014-07-14 DIAGNOSIS — IMO0002 Reserved for concepts with insufficient information to code with codable children: Secondary | ICD-10-CM

## 2014-07-14 DIAGNOSIS — D649 Anemia, unspecified: Secondary | ICD-10-CM

## 2014-07-14 DIAGNOSIS — E559 Vitamin D deficiency, unspecified: Secondary | ICD-10-CM

## 2014-07-14 DIAGNOSIS — Z79899 Other long term (current) drug therapy: Secondary | ICD-10-CM

## 2014-07-14 DIAGNOSIS — E785 Hyperlipidemia, unspecified: Secondary | ICD-10-CM

## 2014-07-14 DIAGNOSIS — F329 Major depressive disorder, single episode, unspecified: Secondary | ICD-10-CM

## 2014-07-14 DIAGNOSIS — F32A Depression, unspecified: Secondary | ICD-10-CM

## 2014-07-14 DIAGNOSIS — E1165 Type 2 diabetes mellitus with hyperglycemia: Secondary | ICD-10-CM

## 2014-07-14 DIAGNOSIS — M940 Chondrocostal junction syndrome [Tietze]: Secondary | ICD-10-CM

## 2014-07-14 DIAGNOSIS — E039 Hypothyroidism, unspecified: Secondary | ICD-10-CM

## 2014-07-14 NOTE — Patient Instructions (Addendum)
Please continue all medications as prescribed. Please make sure you are drinking plenty of water to stay hydrated. Use heat pack for costochondritis and Tylenol. Take B12 every other day.    Please get blood drawn on February 8th and please come in for physical with Dr. Melford Aase on February 11th.     GIVE PT FOOD CHOICE LISTS FOR MEAL PLANNING  1)The amount of food you eat is important  -Too much can increase glucose levels and cause you to gain weight (which can  also increase glucose levels.  -Too little can decrease glucose level to unsafe levels (<70) 2)Eat meals and snacks at the same time each day to help your diabetes medication help you.  If you eat at different times each day, then the medication will not be as effective. 3)Do NOT skip meals or eat meals later than usual.  If you skip meals, then your glucose level can go low (<70).  Eating meals later than usual will not help the medication work effectively. 4) Amount of carbohydrates (carbs) per meal  -Breakfast- 30-45 grams of carbs  -Lunch and Dinner- 45-60 grams of carbs  -Snacks- 15-30 grams of carbs 5)Low fat foods have no more than 3 grams of fat per serving.  -Saturated- look for less than 1 gram per serving  -Trans Fat- look for 0 grams per serving 6)Exercise at least 120 minutes per week  Exercise Benefits:   -Lower LDL (Bad cholesterol)   -Lower blood pressure   -Increase HDL (Good cholesterol)   -Strengthen heart, lungs and muscles   -Burn calories and relieve stress   -Sleep better and help you feel better overall  To lower your risk of heart disease, limit your intake of saturated fat and trans fat as much as possible. THE GOOD WHAT IT DOES WHERE IT'S FOUND  MONOUNSATURATED FAT Lowers LDL and maybe raises HDL cholesterol Canola oil, olives, olive oil, peanuts, peanut oil, avocados, nuts  POLYUNSATURATED FAT Lowers LDL cholesterol Corn, safflower, sunflower and soybean oils, nuts, seeds  OMEGA-3 FATTY  ACIDS Lowers triglycerides (blood fats) and blood pressure Salmon, mackerel, herring, sardines, flax seed, flaxseed oil, walnuts, soybean oil  THE BAD WHAT IT DOES WHERE IT'S FOUND  SATURATED FAT Raises LDL (bad) cholesterol Butter, shortening, lard, red meat, cheese, whole milk, ice cream, coconut and palm oils  TRANS FAT Raises LDL cholesterol, lowers HDL (good) cholesterol Fried foods, some stick margarines, some cookies and crackers (look for hydrogenated fat on the ingredient list)  CHOLESTEROL FROM FOOD Too much may raise cholesterol levels Meat, poultry, seafood, eggs, milk, cheese, yogurt, butter   Plate Method (How much food of each food group) 1) Fill one half of your plate with nonstarchy vegetables: lettuce, broccoli, green beans, spinach, carrots or peppers. 2) Fill one quarter with protein: chicken, [place], fish, lean meat, eggs or tofu. 3) Fill one quarter with a nutritious carbohydrate food: brown rice, whole-wheat pasta, whole-wheat bread, peas, or corn.  Choose whole-wheat carbs for extra nutrition.  Controlling carbs helps you control your blood glucose. 4) Include a small piece of fruit at each meal, as well as 8 ounces of lowfat milk or yogurt. 5) Add 1-2 teaspoons of heart-healthy fat, such as olive or canola oil, trans fat-free margarine, avocado, nuts or seeds.

## 2014-07-14 NOTE — Progress Notes (Signed)
Subjective:    Patient ID: Alyssa [name], female    DOB: 25-Apr-1946, 68 y.o.   MRN: [id number]  HPI A Caucasian 68yo female presents to the office today for follow up for chronic kidney disease stage 4 (GFR= 26 on 06/21/14) and to go over labs drawn on 07/12/14.  Patient states she has been taking the medications as prescribed at last visit.   At her last visit on 06/24/14 we adjusted her medication to: Take Gabapentin 800mg - Take 1 tablet two times a day. Take Metformin XR 500mg - Take 1 tablet two times a day with meals. Take Lasix 40mg - take 1/2 tablet in the morning and 1/2 tablet in the evening. Take Brintellix 10mg - Take 1/2 tablet daily   She states she went to her opthalmalogist and they adjusted her eye medication due to her kidney function.  She is currently taking: Latanoprost 0.005%- 1 drop in both eyes every evening. Timolol Maleate 0.5%- 1 drop into both eyes twice daily.    Patient's labs from 07/12/14 show that urine culture was negative.  Microalbumin was high at 32.8 and microalbumin Creat Ratio was high at 596.4, which is an improvement compared to last one.  Will monitor, control BP and diabetes.  Urinalysis showed protein only and microscopic urinalysis was negative.  CBC showed mild anemia that has improved.  Will monitor.  BMP showed improvement to CO2 levels (33).  BUN is elevated to 31, but has improved since last labs.  Creatine of 1.42 and GFR of 38, which have also improved since last labs.  Her liver function is normal.  Her magnesium level is normal.  Her vitamin B12 level is elevated to >2000, will have patient take every other day.  Her vitamin D level is normal.  Will have patient continue all medications as prescribed, except take the B12 every other day.  Patient states she is feeling much better and is not having the hallucinations anymore.  She states the Brintellix is working great.  Will cancel MRI.  She does admit to right rib pain and has been diagnosed  with costochondritis in the past and states her right breast is bigger than her left.  We ordered mammogram and it was done on 12/25/13.  It showed "scattered areas of fibroglandular density and did show a benign intramammary node in the right breast.  No evidence of malignancy".  Patient states she does SBE's regularly and has not felt any changes.  Will monitor.  She does need refills of Brintellix and Uloric.  Patient states she has appointment with kidney doctor on 07/26/14 that she scheduled herself.  Review of Systems  Constitutional: Negative.   HENT: Negative.   Eyes: Negative.   Respiratory: Negative.   Cardiovascular: Negative.  Negative for chest pain.       Except rib pain on the right lower rib cage near sternum.  Gastrointestinal: Negative.   Genitourinary: Negative.   Musculoskeletal: Negative.   Skin: Negative.   Neurological: Negative.   Psychiatric/Behavioral: Negative.        Except for depression   Past Medical History  Diagnosis Date  . Osteopenia   . Osteoporosis   . Diabetes mellitus without complication   . Hypertension   . Bipolar 1 disorder   . Hyperlipidemia   . Anemia   . Cataract   . Unspecified vitamin D deficiency   . Glaucoma   . Chronic back pain   . Hypothyroidism    Current Outpatient Prescriptions on File Prior to  Visit  Medication Sig Dispense Refill  . aspirin 81 MG tablet Take 81 mg by mouth daily.    . brimonidine (ALPHAGAN) 0.15 % ophthalmic solution     . Cholecalciferol (VITAMIN D-3 PO) Take 5,000 Units by mouth every evening.     . Cyanocobalamin (VITAMIN B 12 PO) Take 1,000 mcg by mouth daily.    . dorzolamide-timolol (COSOPT) 22.3-6.8 MG/ML ophthalmic solution Place 1 drop into both eyes 2 (two) times daily.     . DUREZOL 0.05 % EMUL     . febuxostat (ULORIC) 40 MG tablet Take 1 tablet (40 mg total) by mouth daily. 7 tablet 0  . furosemide (LASIX) 40 MG tablet Take 1/2 tablet PO in the morning and 1/2 tablet PO in the evening. 15  tablet 1  . gabapentin (NEURONTIN) 800 MG tablet Take 1 tablet (800 mg total) by mouth 2 (two) times daily. 60 tablet 0  . glipiZIDE (GLUCOTROL) 10 MG tablet Take 5-10 mg by mouth 2 (two) times daily as needed (only takes if needed, depending on glucose level >200).     Marland Kitchen latanoprost (XALATAN) 0.005 % ophthalmic solution     . levothyroxine (SYNTHROID) 50 MCG tablet Take 1 tablet (50 mcg total) by mouth daily before breakfast. 90 tablet 2  . losartan (COZAAR) 100 MG tablet Take 1 tablet (100 mg total) by mouth daily. 90 tablet 1  . MAGNESIUM-OXIDE 400 (241.3 MG) MG tablet TAKE 1 TABLET BY MOUTH TWICE DAILY 180 tablet 99  . metFORMIN (GLUCOPHAGE-XR) 500 MG 24 hr tablet Take 1 tablet (500 mg total) by mouth 2 (two) times daily with a meal. 60 tablet 0  . minoxidil (LONITEN) 10 MG tablet Take 1 Tablet daily as directed for BP 90 tablet 0  . Multiple Vitamin (MULTIVITAMIN) tablet Take 1 tablet by mouth daily.    . Omega-3 Fatty Acids (FISH OIL) 1000 MG CAPS Take 1,000 mg by mouth every evening.     . Probiotic Product (PROBIOTIC DAILY PO) Take 1 tablet by mouth every morning.     Marland Kitchen PROLENSA 0.07 % SOLN     . rosuvastatin (CRESTOR) 5 MG tablet Take 1 tablet (5 mg total) by mouth daily. 42 tablet 0  . traZODone (DESYREL) 150 MG tablet TAKE 1/3 TO 1/2 TO 1 TABLET BY MOUTH EVERY NIGHT AT BEDTIME AS NEEDED FOR SLEEP 90 tablet 0  . vitamin B-12 (CYANOCOBALAMIN) 1000 MCG tablet Take 1,000 mcg by mouth daily.    . Vortioxetine HBr (BRINTELLIX) 10 MG TABS Take 1/2 tablet PO daily. 28 tablet 0   No current facility-administered medications on file prior to visit.   Allergies  Allergen Reactions  . Ciprofloxacin Hcl Diarrhea  . Cymbalta [Duloxetine Hcl] Other (See Comments)    Per pt: it did not work  . Codeine Other (See Comments)    Per pt: unknown  . Lortab [Hydrocodone-Acetaminophen] Other (See Comments)    Per pt: unknown  . Oxycodone Other (See Comments)    Per pt: unknown      BP 122/58 mmHg   Pulse 80  Temp(Src) 98.2 F (36.8 C) (Temporal)  Resp 18  Ht 5' 3.25" (1.607 m)  Wt 221 lb (100.245 kg)  BMI 38.82 kg/m2  SpO2 94% Wt Readings from Last 3 Encounters:  07/14/14 221 lb (100.245 kg)  06/24/14 224 lb (101.606 kg)  06/21/14 226 lb (102.513 kg)   Objective:   Physical Exam  Constitutional: She is oriented to person, place, and time. She appears  well-developed and well-nourished. She does not have a sickly appearance. No distress.  HENT:  Head: Normocephalic.  Right Ear: Tympanic membrane, external ear and ear canal normal.  Left Ear: Tympanic membrane, external ear and ear canal normal.  Nose: Nose normal. Right sinus exhibits no maxillary sinus tenderness and no frontal sinus tenderness. Left sinus exhibits no maxillary sinus tenderness and no frontal sinus tenderness.  Mouth/Throat: Uvula is midline, oropharynx is clear and moist and mucous membranes are normal. Mucous membranes are not pale and not dry. No trismus in the jaw. No uvula swelling. No oropharyngeal exudate.  Eyes: Conjunctivae and lids are normal. Pupils are equal, round, and reactive to light. Right eye exhibits no discharge. Left eye exhibits no discharge. No scleral icterus.  Neck: Trachea normal, normal range of motion and phonation normal. Neck supple. No tracheal tenderness present. No tracheal deviation present.  Cardiovascular: Normal rate, regular rhythm, S1 normal, S2 normal, normal heart sounds, intact distal pulses and normal pulses.  Exam reveals no gallop, no distant heart sounds and no friction rub.   No murmur heard. Pulmonary/Chest: Effort normal and breath sounds normal. No stridor. No respiratory distress. She has no decreased breath sounds. She has no wheezes. She has no rhonchi. She has no rales. She exhibits no tenderness.  Abdominal: Soft. Bowel sounds are normal. There is no tenderness. There is no rebound and no guarding.  Pendulous abdomen  Lymphadenopathy:  No tenderness or LAD.   Neurological: She is alert and oriented to person, place, and time. She has normal strength.  Gait is antalgic.   Skin: Skin is warm, dry and intact. No rash noted. She is not diaphoretic. No pallor.  Psychiatric: She has a normal mood and affect. Her speech is normal and behavior is normal. Judgment and thought content normal. Cognition and memory are normal.  Vitals reviewed.  Assessment & Plan:  1. CKD (chronic kidney disease) stage 3, GFR 30-59 ml/min Please continue medications as prescribed.  Please make sure you are drinking plenty of water to stay hydrated.  Will monitor kidney function.  Patient has appointment with kidney doctor on 07/26/14 that she scheduled herself.  2. Essential hypertension Continue medications as prescribed.  Monitor blood pressure at home.  Reminder to go to the ER if any CP, SOB, nausea, dizziness, severe HA, changes vision/speech, left arm numbness and tingling and jaw pain.   3. Depression Continue Brintellix as prescribed-1/2 tablet (10mg ) daily.  Dr. Melford Aase needs to speak with patient at next appt about going to see psychiatrist.  Patient has Bipolar Disorder on PMH from another practice and needs to be evaluated for that and needs to be managed by psychiatrist.  Patient was given Ladson on 06/24/14.  4. Costochondritis Please take Tylenol OTC for pain.  Use hot/heating packs when pain occurs.  Do not want to change medications right now due to kidney function.  5. Encounter for long-term (current) use of medications Will monitor kidney and liver function.  Take Vitamin B12 every other day due to high blood level.  Discussed medication effects and SE's.  Pt agreed to treatment plan. Please get blood drawn on February 8th and please come in for physical with Dr. Melford Aase on September 02, 2014.  Treyvin Glidden, Stephani Police, PA-C 9:58 AM [facility]

## 2014-07-15 ENCOUNTER — Encounter: Payer: Self-pay | Admitting: Physician Assistant

## 2014-07-15 MED ORDER — FEBUXOSTAT 40 MG PO TABS: 40.0000 mg | ORAL_TABLET | Freq: Every day | ORAL | Status: DC

## 2014-07-15 MED ORDER — VORTIOXETINE HBR 10 MG PO TABS: ORAL_TABLET | ORAL | Status: DC

## 2014-07-15 NOTE — Addendum Note (Signed)
Addended by: Charolette Forward on: 07/15/2014 10:27 AM   Modules accepted: Orders

## 2014-07-20 ENCOUNTER — Encounter: Payer: Self-pay | Admitting: Internal Medicine

## 2014-07-26 DIAGNOSIS — I10 Essential (primary) hypertension: Secondary | ICD-10-CM | POA: Diagnosis not present

## 2014-07-26 DIAGNOSIS — E119 Type 2 diabetes mellitus without complications: Secondary | ICD-10-CM | POA: Diagnosis not present

## 2014-07-26 DIAGNOSIS — N183 Chronic kidney disease, stage 3 (moderate): Secondary | ICD-10-CM | POA: Diagnosis not present

## 2014-08-02 ENCOUNTER — Other Ambulatory Visit: Payer: Self-pay | Admitting: Internal Medicine

## 2014-08-02 ENCOUNTER — Other Ambulatory Visit: Payer: Self-pay | Admitting: *Deleted

## 2014-08-02 DIAGNOSIS — M109 Gout, unspecified: Secondary | ICD-10-CM

## 2014-08-02 MED ORDER — FEBUXOSTAT 40 MG PO TABS: 40.0000 mg | ORAL_TABLET | Freq: Every day | ORAL | Status: DC

## 2014-08-10 ENCOUNTER — Other Ambulatory Visit: Payer: Self-pay | Admitting: Emergency Medicine

## 2014-08-10 MED ORDER — FEBUXOSTAT 80 MG PO TABS: ORAL_TABLET | ORAL | Status: DC

## 2014-08-10 NOTE — Progress Notes (Signed)
Patient insurance unable to cover RX will give Samples and advise take 1/2 AD

## 2014-08-19 ENCOUNTER — Other Ambulatory Visit: Payer: Self-pay | Admitting: Internal Medicine

## 2014-08-30 ENCOUNTER — Other Ambulatory Visit: Payer: Self-pay | Admitting: Internal Medicine

## 2014-08-30 ENCOUNTER — Other Ambulatory Visit: Payer: Medicare Other

## 2014-08-30 DIAGNOSIS — E039 Hypothyroidism, unspecified: Secondary | ICD-10-CM | POA: Diagnosis not present

## 2014-08-30 DIAGNOSIS — R809 Proteinuria, unspecified: Secondary | ICD-10-CM | POA: Diagnosis not present

## 2014-08-30 DIAGNOSIS — I10 Essential (primary) hypertension: Secondary | ICD-10-CM

## 2014-08-30 DIAGNOSIS — D649 Anemia, unspecified: Secondary | ICD-10-CM | POA: Diagnosis not present

## 2014-08-30 DIAGNOSIS — Z79899 Other long term (current) drug therapy: Secondary | ICD-10-CM

## 2014-08-30 DIAGNOSIS — E785 Hyperlipidemia, unspecified: Secondary | ICD-10-CM | POA: Diagnosis not present

## 2014-08-30 DIAGNOSIS — E1165 Type 2 diabetes mellitus with hyperglycemia: Secondary | ICD-10-CM | POA: Diagnosis not present

## 2014-08-30 DIAGNOSIS — N183 Chronic kidney disease, stage 3 unspecified: Secondary | ICD-10-CM

## 2014-08-30 DIAGNOSIS — M109 Gout, unspecified: Secondary | ICD-10-CM

## 2014-08-30 DIAGNOSIS — E559 Vitamin D deficiency, unspecified: Secondary | ICD-10-CM

## 2014-08-30 DIAGNOSIS — IMO0002 Reserved for concepts with insufficient information to code with codable children: Secondary | ICD-10-CM

## 2014-08-30 LAB — BASIC METABOLIC PANEL WITH GFR
BUN: 38 mg/dL — ABNORMAL HIGH (ref 6–23)
CALCIUM: 10 mg/dL (ref 8.4–10.5)
CO2: 29 meq/L (ref 19–32)
CREATININE: 1.69 mg/dL — AB (ref 0.50–1.10)
Chloride: 93 mEq/L — ABNORMAL LOW (ref 96–112)
GFR, EST NON AFRICAN AMERICAN: 31 mL/min — AB
GFR, Est African American: 35 mL/min — ABNORMAL LOW
Glucose, Bld: 257 mg/dL — ABNORMAL HIGH (ref 70–99)
POTASSIUM: 4 meq/L (ref 3.5–5.3)
Sodium: 137 mEq/L (ref 135–145)

## 2014-08-30 LAB — CBC WITH DIFFERENTIAL/PLATELET
BASOS ABS: 0 10*3/uL (ref 0.0–0.1)
BASOS PCT: 0 % (ref 0–1)
Eosinophils Absolute: 0.1 10*3/uL (ref 0.0–0.7)
Eosinophils Relative: 2 % (ref 0–5)
HCT: 37.8 % (ref 36.0–46.0)
HEMOGLOBIN: 12.3 g/dL (ref 12.0–15.0)
Lymphocytes Relative: 11 % — ABNORMAL LOW (ref 12–46)
Lymphs Abs: 0.6 10*3/uL — ABNORMAL LOW (ref 0.7–4.0)
MCH: 28.2 pg (ref 26.0–34.0)
MCHC: 32.5 g/dL (ref 30.0–36.0)
MCV: 86.7 fL (ref 78.0–100.0)
MPV: 11.7 fL (ref 8.6–12.4)
Monocytes Absolute: 0.4 10*3/uL (ref 0.1–1.0)
Monocytes Relative: 7 % (ref 3–12)
NEUTROS ABS: 4.5 10*3/uL (ref 1.7–7.7)
Neutrophils Relative %: 80 % — ABNORMAL HIGH (ref 43–77)
PLATELETS: 200 10*3/uL (ref 150–400)
RBC: 4.36 MIL/uL (ref 3.87–5.11)
RDW: 14.6 % (ref 11.5–15.5)
WBC: 5.6 10*3/uL (ref 4.0–10.5)

## 2014-08-30 LAB — VITAMIN D 25 HYDROXY (VIT D DEFICIENCY, FRACTURES): Vit D, 25-Hydroxy: 57 ng/mL (ref 30–100)

## 2014-08-30 LAB — LIPID PANEL
Cholesterol: 130 mg/dL (ref 0–200)
HDL: 45 mg/dL (ref >39)
LDL Cholesterol: 59 mg/dL (ref 0–99)
Total CHOL/HDL Ratio: 2.9 Ratio
Triglycerides: 129 mg/dL (ref <150)
VLDL: 26 mg/dL (ref 0–40)

## 2014-08-30 LAB — URIC ACID: URIC ACID, SERUM: 3.8 mg/dL (ref 2.4–7.0)

## 2014-08-30 LAB — HEPATIC FUNCTION PANEL
ALBUMIN: 4 g/dL (ref 3.5–5.2)
ALT: 859 U/L — AB (ref 0–35)
AST: 558 U/L — ABNORMAL HIGH (ref 0–37)
Alkaline Phosphatase: 237 U/L — ABNORMAL HIGH (ref 39–117)
BILIRUBIN DIRECT: 3.3 mg/dL — AB (ref 0.0–0.3)
BILIRUBIN INDIRECT: 1.8 mg/dL — AB (ref 0.2–1.2)
BILIRUBIN TOTAL: 5.1 mg/dL — AB (ref 0.2–1.2)
TOTAL PROTEIN: 6.9 g/dL (ref 6.0–8.3)

## 2014-08-30 LAB — HEMOGLOBIN A1C
HEMOGLOBIN A1C: 8.2 % — AB (ref <5.7)
MEAN PLASMA GLUCOSE: 189 mg/dL — AB (ref <117)

## 2014-08-30 LAB — IRON AND TIBC
%SAT: 8 % — ABNORMAL LOW (ref 20–55)
Iron: 24 ug/dL — ABNORMAL LOW (ref 42–145)
TIBC: 299 ug/dL (ref 250–470)
UIBC: 275 ug/dL (ref 125–400)

## 2014-08-30 LAB — FERRITIN: FERRITIN: 185 ng/mL (ref 10–291)

## 2014-08-30 LAB — VITAMIN B12: Vitamin B-12: 1828 pg/mL — ABNORMAL HIGH (ref 211–911)

## 2014-08-30 LAB — TSH: TSH: 1.675 u[IU]/mL (ref 0.350–4.500)

## 2014-08-31 LAB — URINE CULTURE: Colony Count: 70000

## 2014-08-31 LAB — URINALYSIS, MICROSCOPIC ONLY
Bacteria, UA: NONE SEEN
CRYSTALS: NONE SEEN
Casts: NONE SEEN
SQUAMOUS EPITHELIAL / LPF: NONE SEEN

## 2014-08-31 LAB — URINALYSIS, ROUTINE W REFLEX MICROSCOPIC
Glucose, UA: NEGATIVE mg/dL
KETONES UR: NEGATIVE mg/dL
Nitrite: NEGATIVE
PH: 7 (ref 5.0–8.0)
Protein, ur: 100 mg/dL — AB
SPECIFIC GRAVITY, URINE: 1.009 (ref 1.005–1.030)
Urobilinogen, UA: 1 mg/dL (ref 0.0–1.0)

## 2014-08-31 LAB — MICROALBUMIN / CREATININE URINE RATIO
Creatinine, Urine: 94.9 mg/dL
MICROALB UR: 108.6 mg/dL — AB (ref <2.0)
Microalb Creat Ratio: 1144.4 mg/g — ABNORMAL HIGH (ref 0.0–30.0)

## 2014-08-31 LAB — INSULIN, FASTING: Insulin fasting, serum: 2.9 u[IU]/mL (ref 2.0–19.6)

## 2014-09-02 ENCOUNTER — Ambulatory Visit (INDEPENDENT_AMBULATORY_CARE_PROVIDER_SITE_OTHER): Payer: Medicare Other | Admitting: Internal Medicine

## 2014-09-02 ENCOUNTER — Encounter: Payer: Self-pay | Admitting: Internal Medicine

## 2014-09-02 ENCOUNTER — Other Ambulatory Visit: Payer: Self-pay | Admitting: Internal Medicine

## 2014-09-02 VITALS — BP 132/80 | HR 72 | Temp 98.1°F | Resp 16 | Ht 63.0 in | Wt 216.2 lb

## 2014-09-02 DIAGNOSIS — IMO0001 Reserved for inherently not codable concepts without codable children: Secondary | ICD-10-CM

## 2014-09-02 DIAGNOSIS — Z23 Encounter for immunization: Secondary | ICD-10-CM

## 2014-09-02 DIAGNOSIS — R17 Unspecified jaundice: Secondary | ICD-10-CM | POA: Diagnosis not present

## 2014-09-02 DIAGNOSIS — R809 Proteinuria, unspecified: Secondary | ICD-10-CM

## 2014-09-02 DIAGNOSIS — R945 Abnormal results of liver function studies: Secondary | ICD-10-CM

## 2014-09-02 DIAGNOSIS — E559 Vitamin D deficiency, unspecified: Secondary | ICD-10-CM

## 2014-09-02 DIAGNOSIS — Z1212 Encounter for screening for malignant neoplasm of rectum: Secondary | ICD-10-CM

## 2014-09-02 DIAGNOSIS — I10 Essential (primary) hypertension: Secondary | ICD-10-CM | POA: Diagnosis not present

## 2014-09-02 DIAGNOSIS — M1000 Idiopathic gout, unspecified site: Secondary | ICD-10-CM

## 2014-09-02 DIAGNOSIS — Z9181 History of falling: Secondary | ICD-10-CM

## 2014-09-02 DIAGNOSIS — E039 Hypothyroidism, unspecified: Secondary | ICD-10-CM

## 2014-09-02 DIAGNOSIS — R7989 Other specified abnormal findings of blood chemistry: Secondary | ICD-10-CM

## 2014-09-02 DIAGNOSIS — Z1331 Encounter for screening for depression: Secondary | ICD-10-CM

## 2014-09-02 DIAGNOSIS — F319 Bipolar disorder, unspecified: Secondary | ICD-10-CM

## 2014-09-02 DIAGNOSIS — E119 Type 2 diabetes mellitus without complications: Secondary | ICD-10-CM | POA: Diagnosis not present

## 2014-09-02 DIAGNOSIS — E785 Hyperlipidemia, unspecified: Secondary | ICD-10-CM

## 2014-09-02 DIAGNOSIS — Z79899 Other long term (current) drug therapy: Secondary | ICD-10-CM

## 2014-09-02 LAB — GAMMA GT: GGT: 290 U/L — ABNORMAL HIGH (ref 7–51)

## 2014-09-02 MED ORDER — SERTRALINE HCL 50 MG PO TABS: 50.0000 mg | ORAL_TABLET | Freq: Every day | ORAL | Status: DC

## 2014-09-02 NOTE — Progress Notes (Addendum)
Patient ID: Alyssa [name], female   DOB: 02/04/1946, 69 y.o.   MRN: [id number]  [facility] VISIT AND CPE  Assessment:   1. Essential hypertension  - EKG 12-Lead - [name], RETROPERITNL ABD,  LTD  2. Hyperlipidemia   3. Obesity, morbid   4. 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 Hypothyroidism, unspecified hypothyroidism type   6. Vitamin D deficiency   7. Idiopathic gout, unspecified chronicity, unspecified site   8. Bipolar 1 disorder   9. Screening for rectal cancer   10. Depression screen   11. At low risk for fall   12. Encounter for long-term (current) use of medications   13. Abnormal LFTs  - US Abdomen Complete; Future - Hepatitis A antibody, total - Hepatitis B core antibody, total - Hepatitis B e antibody - Hepatitis B surface antibody - Hepatitis C antibody - Gamma GT  14. Jaundice  - US Abdomen Complete; Future - Hepatitis A antibody, total - Hepatitis B core antibody, total - Hepatitis B e antibody - Hepatitis B surface antibody - Hepatitis C antibody - Gamma GT  15. Need for prophylactic vaccination against Streptococcus pneumoniae (pneumococcus)  - Pneumococcal conjugate vaccine 13-valent  Plan:   During the course of the visit the patient was educated and counseled about appropriate screening and preventive services including:    Pneumococcal vaccine   Influenza vaccine  Td vaccine  Screening electrocardiogram  Screening mammography  Bone densitometry screening  Colorectal cancer screening  Diabetes screening  Glaucoma screening  Nutrition counseling   Advanced directives: given info/requested  Screening recommendations, referrals:  Vaccinations: Tdap vaccine declined Influenza vaccine ordered Pneumococcal vaccine prevnar next visit Shingles vaccine not indicated Hep B vaccine not indicated  Nutrition assessed and recommended  Colonoscopy up to date Mammogram up to  date Pap smear not indicated Pelvic exam not indicated Recommended yearly ophthalmology/optometry visit for glaucoma screening and checkup Recommended yearly dental visit for hygiene and checkup Advanced directives - declined  Conditions/risks identified: BMI: Discussed weight loss, diet, and increase physical activity.  Increase physical activity: AHA recommends 150 minutes of physical activity a week.  Medications reviewed DEXA- up to date Diabetes is not at goal, ACE/ARB therapy: Yes. Urinary Incontinence is an issue: discussed non pharmacology and pharmacology options.  Fall risk: high- discussed PT, home fall assessment, medications. She states she can not afford PT at this time, if she decides to get it she will call   Subjective:   Alyssa [name] is a 69 y.o. female who presents for Medicare Annual Wellness Visit and CPE to  follow up on hypertension, diabetes, hyperlipidemia, vitamin D def.  Date of last medicare wellness visit is 04/06/2014.   Her blood pressure has been controlled at home, today their BP is BP: 132/80 mmHg She does not workout due to pain. She denies chest pain, shortness of breath, dizziness.  She is on cholesterol medication and denies myalgias. Her cholesterol is at goal. The cholesterol last visit was:   Lab Results  Component Value Date   CHOL 130 08/30/2014   HDL 45 08/30/2014   LDLCALC 59 08/30/2014   TRIG 129 08/30/2014   CHOLHDL 2.9 08/30/2014   She has not been working on diet and exercise for diabetes, she has CKD stage III from DM, she is on metformin/glipizide and ARB, denies hypoglycemia, and denies polydipsia and polyuria. Last A1C in the office was:  Lab Results  Component Value Date   HGBA1C 8.2* 08/30/2014  Patient is on Vitamin D supplement. Lab Results  Component Value Date   VD25OH 57 08/30/2014     Patient had diastolic heart failure and was in the hospital for right pleural effusion, COPD and respiratory failure. This has  resolved.  She has been seeing Dr. Krista Blue for worsening memory and had MRI that showed vascular dementia. She is on Brintellix for depression and cognition which is helping her and it has helped her tremor. .  She states she has pain everywhere, in her hands, hips, and right buttock. She can not walk by herself, walks with walker, she was doing water aerobics which helped but she was unable to continue do to cost.  She is on thyroid medication. Her medication was not changed last visit. Patient denies nervousness and palpitations.  Lab Results  Component Value Date   TSH 1.675 08/30/2014  .  Names of Other Physician/Practitioners you currently use: 1. [facility]- here for primary care 2. none, dentist, last visit years Patient Care Team: Unk Pinto, MD as PCP - General (Internal Medicine) Lafayette Dragon, MD as Consulting Physician (Gastroenterology) Marcial Pacas, MD as Consulting Physician (Neurology) Estevan Ryder, MD as Referring Physician (Cardiology) Roselie Awkward, MD as Consulting Physician (Ophthalmology)- yesterday Jari Pigg, MD as Consulting Physician (Dermatology)  Medication Review Medication Sig  . aspirin 81 MG tablet Take 81 mg by mouth daily.  . brimonidine (ALPHAGAN) 0.15 % ophthalmic solution   . Cholecalciferol (VITAMIN D-3 PO) Take 5,000 Units by mouth every evening.   . Febuxostat (ULORIC) 80 MG TABS Take 1/2 tablet AD  . furosemide (LASIX) 40 MG tablet Take 1/2 tablet PO in the morning and 1/2 tablet PO in the evening.  . gabapentin (NEURONTIN) 800 MG tablet TAKE 1 TABLET BY MOUTH TWICE DAILY  . glipiZIDE (GLUCOTROL) 10 MG tablet Take 5-10 mg by mouth 2 (two) times daily as needed (only takes if needed, depending on glucose level >200).   Marland Kitchen latanoprost (XALATAN) 0.005 % ophthalmic solution Place 1 drop into both eyes every evening.   Marland Kitchen levothyroxine (SYNTHROID) 50 MCG tablet Take 1 tablet (50 mcg total) by mouth daily before  breakfast.  . losartan (COZAAR) 100 MG tablet Take 1 tablet (100 mg total) by mouth daily.  Marland Kitchen MAGNESIUM-OXIDE 400 (241.3 MG) MG tablet TAKE 1 TABLET BY MOUTH TWICE DAILY  . metFORMIN (GLUCOPHAGE-XR) 500 MG 24 hr tablet Take 1 tablet (500 mg total) by mouth 2 (two) times daily with a meal.  . minoxidil (LONITEN) 10 MG tablet Take 1 Tablet daily as directed for BP  . Multiple Vitamin (MULTIVITAMIN) tablet Take 1 tablet by mouth daily.  . Omega-3 Fatty Acids (FISH OIL) 1000 MG CAPS Take 1,000 mg by mouth every evening.   . Probiotic Product (PROBIOTIC DAILY PO) Take 1 tablet by mouth every morning.   Marland Kitchen PROLENSA 0.07 % SOLN   . rosuvastatin (CRESTOR) 5 MG tablet Take 1 tablet (5 mg total) by mouth daily.  . timolol (TIMOPTIC) 0.5 % ophthalmic solution Place 1 drop into both eyes 2 (two) times daily.   . traZODone (DESYREL) 150 MG tablet TAKE 1/3 TO 1/2 TO 1 TABLET BY MOUTH EVERY NIGHT AT BEDTIME AS NEEDED FOR SLEEP  . Vortioxetine HBr (BRINTELLIX) 10 MG TABS Take 1/2 tablet PO daily.  . Cyanocobalamin (VITAMIN B 12 PO) Take 1,000 mcg by mouth every other day.  . vitamin B-12 (CYANOCOBALAMIN) 1000 MCG tablet Take 1,000 mcg by mouth daily.  Current Problems (verified) Patient Active Problem List   Diagnosis Date Noted  . Encounter for long-term (current) use of medications 07/14/2014  . Hypothyroidism 07/14/2014  . CKD (chronic kidney disease) stage 3, GFR 30-59 ml/min 06/24/2014  . Gout 06/24/2014  . Trouble in sleeping 06/24/2014  . Tremor 01/12/2014  . Pleural effusion- tapped 10/14/2013  . Acute diastolic congestive heart failure 10/13/2013  . Community acquired pneumonia 10/12/2013  . Acute respiratory failure with hypoxia 10/12/2013  . Acute encephalopathy- resolved 10/12/2013  . Acute kidney injury 10/12/2013  . Obesity, morbid 07/15/2013  . Depression 07/15/2013  . Vitamin D deficiency   . Osteoporosis   . 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  Hypertension   . Bipolar 1 disorder   . Hyperlipidemia   . Anemia   . Cataract    Screening Tests Health Maintenance  Topic Date Due  . OPHTHALMOLOGY EXAM  12/04/1955  . TETANUS/TDAP  07/23/2012  . INFLUENZA VACCINE  02/21/2015  . HEMOGLOBIN A1C  02/28/2015  . FOOT EXAM  04/02/2015  . URINE MICROALBUMIN  08/31/2015  . MAMMOGRAM  12/26/2015  . COLONOSCOPY  10/08/2018  . DEXA SCAN  Completed  . PNEUMOCOCCAL POLYSACCHARIDE VACCINE AGE 15 AND OVER  Completed  . ZOSTAVAX  Completed    Immunization History  Administered Date(s) Administered  . DT 05/18/2014  . Influenza Split 04/22/2013  . Influenza, High Dose Seasonal PF 04/01/2014  . Pneumococcal Polysaccharide-23 07/23/2001, 07/23/2010  . Td 07/23/2002  . Zoster 07/23/2006   Preventative care: Last colonoscopy: 09/2013 Last mammogram: 12/2013 Last pap smear/pelvic exam: 2007  DEXA:12/2013 Myoview stress normal 09/2013, EF 65% Renal US normal 09/2013  Prior vaccinations: TD or Tdap: 2004- needs Influenza: 2014 DUE Pneumococcal: 2012  Prevnar 13: Needs Shingles/Zostavax: 2008  History reviewed: allergies, current medications, past family history, past medical history, past social history, past surgical history and problem list  Risk Factors: Osteoporosis: postmenopausal estrogen deficiency and dietary calcium and/or vitamin D deficiency History of fracture in the past year: no  Tobacco History  Substance Use Topics  . Smoking status: Never Smoker   . Smokeless tobacco: Never Used  . Alcohol Use: Yes     Comment: social drinker- glass of wine or cocktail   She does not smoke.  Patient is a former smoker. Are there smokers in your home (other than you)?  No  Alcohol Current alcohol use: none  Caffeine Current caffeine use: coffee 1 /day  Exercise Current exercise: none  Nutrition/Diet Current diet: in general, an "unhealthy" diet  Cardiac risk factors: advanced age (older than 62 for men, 68 for  women), diabetes mellitus, dyslipidemia, hypertension, obesity (BMI >= 30 kg/m2) and sedentary lifestyle.  Depression Screen (Note: if answer to either of the following is "Yes", a more complete depression screening is indicated)   Q1: Over the past two weeks, have you felt down, depressed or hopeless? Yes  Q2: Over the past two weeks, have you felt little interest or pleasure in doing things? Yes  Have you lost interest or pleasure in daily life? Yes  Do you often feel hopeless? No  Do you cry easily over simple problems? No  Activities of Daily Living In your present state of health, do you have any difficulty performing the following activities?:  Driving? Yes Managing money?  No Feeding yourself? No Getting from bed to chair? Yes Climbing a flight of stairs? Yes Preparing food and eating?: No Bathing or showering? Yes Getting dressed:  No Getting to the toilet? Yes Using the toilet:Yes Moving around from place to place: Yes In the past year have you fallen or had a near fall?:Yes   Are you sexually active?  No  Do you have more than one partner?  No  Vision Difficulties: No  Hearing Difficulties: Yes Do you often ask people to speak up or repeat themselves? Yes Do you experience ringing or noises in your ears? Yes Do you have difficulty understanding soft or whispered voices? Yes  Cognition  Do you feel that you have a problem with memory?Yes  Do you often misplace items? Yes  Do you feel safe at home?  Yes  Advanced directives Does patient have a [place]? No Does patient have a Living Will? No Past Medical History  Diagnosis Date  . Osteopenia   . Osteoporosis   . Diabetes mellitus without complication   . Hypertension   . Bipolar 1 disorder   . Hyperlipidemia   . Anemia   . Cataract   . Unspecified vitamin D deficiency   . Glaucoma   . Chronic back pain   . Hypothyroidism    Past Surgical History  Procedure Laterality Date  .  Back surgery  2007  . Knee arthroscopy Bilateral   . Dilation and curettage of uterus    . Breast surgery Left     CYST REMOVAL  . Lumbar fusion  2009   ROS: Constitutional: Denies fever, chills, weight loss/gain, headaches, insomnia, fatigue, night sweats, and change in appetite. Eyes: Denies redness, blurred vision, diplopia, discharge, itchy, watery eyes.  ENT: Denies discharge, congestion, post nasal drip, epistaxis, sore throat, earache, hearing loss, dental pain, Tinnitus, Vertigo, Sinus pain, snoring.  Cardio: Denies chest pain, palpitations, irregular heartbeat, syncope, dyspnea, diaphoresis, orthopnea, PND, claudication, edema Respiratory: denies cough, dyspnea, DOE, pleurisy, hoarseness, laryngitis, wheezing.  Gastrointestinal: Denies dysphagia, heartburn, reflux, water brash, pain, cramps, nausea, vomiting, bloating, diarrhea, constipation, hematemesis, melena, hematochezia, jaundice, hemorrhoids Genitourinary: Denies dysuria, frequency, urgency, nocturia, hesitancy, discharge, hematuria, flank pain Breast: Breast lumps, nipple discharge, bleeding.  Musculoskeletal: Denies arthralgia, myalgia, stiffness, Jt. Swelling, pain, limp, and strain/sprain. Denies falls. Skin: Denies puritis, rash, hives, warts, acne, eczema, changing in skin lesion Neuro: No weakness, tremor, incoordination, spasms, paresthesia, pain Psychiatric: Denies confusion, memory loss, sensory loss. Denies Depression. Endocrine: Denies change in weight, skin, hair change, nocturia, and paresthesia, diabetic polys, visual blurring, hyper / hypo glycemic episodes.  Heme/Lymph: No excessive bleeding, bruising, enlarged lymph nodes  Objective:   BP 132/80  Pulse 72  Temp 98.1 F   Resp 16  Ht 5\' 3"    Wt 216 lb 3.2 oz     BMI 38.31   General Appearance: Well nourished, alert, WD/WN, female and in no apparent distress. Eyes: PERRLA, EOMs, conjunctiva no swelling or erythema, normal fundi and vessels. Sinuses:  No frontal/maxillary tenderness ENT/Mouth: EACs patent / TMs  nl. Nares clear without erythema, swelling, mucoid exudates. Oral hygiene is good. No erythema, swelling, or exudate. Tongue normal, non-obstructing. Tonsils not swollen or erythematous. Hearing normal.  Neck: Supple, thyroid normal. No bruits, nodes or JVD. Respiratory: Respiratory effort normal.  BS equal and clear bilateral without rales, rhonci, wheezing or stridor. Cardio: Heart sounds are normal with regular rate and rhythm and no murmurs, rubs or gallops. Peripheral pulses are normal and equal bilaterally without edema. No aortic or femoral bruits. Chest: symmetric with normal excursions and percussion. Breasts: Symmetric, without lumps, nipple discharge, retractions, or fibrocystic changes.  Abdomen: Flat, soft  with nl bowel sounds. Nontender, no guarding, rebound, hernias, masses, or organomegaly.  Lymphatics: Non tender without lymphadenopathy.  Genitourinary:  Musculoskeletal: Full ROM all peripheral extremities, joint stability, 5/5 strength, and normal gait. Skin: Warm and dry without rashes, lesions, cyanosis, clubbing or  ecchymosis.  Neuro: Cranial nerves intact, reflexes equal bilaterally. Normal muscle tone, no cerebellar symptoms. Sensation intact by monofilament testing and 2 pt discrimination to the toes bilaterally.  Pysch: Awake and oriented X 3, normal affect, Insight and Judgment appropriate.   Cognitive Testing  Alert? Yes  Normal Appearance?Yes  Oriented to person? Yes  Place? Yes   Time? Yes  Recall of three objects?  Yes  Can perform simple calculations? Yes  Displays appropriate judgment? Yes  Can read the correct time from a watch/clock?Yes  Medicare Attestation I have personally reviewed: The patient's medical and social history Their use of alcohol, tobacco or illicit drugs Their current medications and supplements The patient's functional ability including ADLs,fall risks, home safety risks,  cognitive, and hearing and visual impairment Diet and physical activities Evidence for depression or mood disorders  The patient's weight, height, BMI, and visual acuity have been recorded in the chart.  I have made referrals, counseling, and provided education to the patient based on review of the above and I have provided the patient with a written personalized care plan for preventive services.    Janisa Labus DAVID, MD   09/02/2014

## 2014-09-02 NOTE — Patient Instructions (Signed)
   Start Sertraline 50 mg at 1/2 tablet = 25 mg for 1 week   then increase to 1 whole tablet  After 1 week on 1 whole tablet  - stop the brintellix

## 2014-09-03 LAB — HEPATITIS A ANTIBODY, TOTAL: Hep A Total Ab: NONREACTIVE

## 2014-09-03 LAB — HEPATITIS B CORE ANTIBODY, TOTAL: HEP B C TOTAL AB: NONREACTIVE

## 2014-09-03 LAB — HEPATIC FUNCTION PANEL
ALBUMIN: 3.6 g/dL (ref 3.5–5.2)
ALT: 265 U/L — ABNORMAL HIGH (ref 0–35)
AST: 62 U/L — ABNORMAL HIGH (ref 0–37)
Alkaline Phosphatase: 243 U/L — ABNORMAL HIGH (ref 39–117)
Bilirubin, Direct: 0.7 mg/dL — ABNORMAL HIGH (ref 0.0–0.3)
Indirect Bilirubin: 0.8 mg/dL (ref 0.2–1.2)
Total Bilirubin: 1.5 mg/dL — ABNORMAL HIGH (ref 0.2–1.2)
Total Protein: 6.6 g/dL (ref 6.0–8.3)

## 2014-09-03 LAB — BASIC METABOLIC PANEL WITH GFR
BUN: 41 mg/dL — ABNORMAL HIGH (ref 6–23)
CALCIUM: 9.9 mg/dL (ref 8.4–10.5)
CO2: 29 meq/L (ref 19–32)
Chloride: 97 mEq/L (ref 96–112)
Creat: 1.64 mg/dL — ABNORMAL HIGH (ref 0.50–1.10)
GFR, EST NON AFRICAN AMERICAN: 32 mL/min — AB
GFR, Est African American: 37 mL/min — ABNORMAL LOW
Glucose, Bld: 187 mg/dL — ABNORMAL HIGH (ref 70–99)
Potassium: 3.9 mEq/L (ref 3.5–5.3)
SODIUM: 139 meq/L (ref 135–145)

## 2014-09-03 LAB — HEPATITIS B SURFACE ANTIBODY,QUALITATIVE: HEP B S AB: NEGATIVE

## 2014-09-03 LAB — HEPATITIS C ANTIBODY: HCV AB: NEGATIVE

## 2014-09-06 LAB — HEPATITIS B E ANTIBODY: HEPATITIS BE ANTIBODY: NONREACTIVE

## 2014-09-08 ENCOUNTER — Other Ambulatory Visit: Payer: Self-pay | Admitting: Internal Medicine

## 2014-09-08 DIAGNOSIS — H4011X1 Primary open-angle glaucoma, mild stage: Secondary | ICD-10-CM | POA: Diagnosis not present

## 2014-09-10 ENCOUNTER — Ambulatory Visit
Admission: RE | Admit: 2014-09-10 | Discharge: 2014-09-10 | Disposition: A | Discharge status: Home / Self Care | Payer: Medicare Other | Source: Ambulatory Visit | Attending: Internal Medicine | Admitting: Internal Medicine

## 2014-09-10 ENCOUNTER — Other Ambulatory Visit: Payer: Self-pay | Admitting: Internal Medicine

## 2014-09-10 DIAGNOSIS — R945 Abnormal results of liver function studies: Secondary | ICD-10-CM

## 2014-09-10 DIAGNOSIS — R7989 Other specified abnormal findings of blood chemistry: Secondary | ICD-10-CM

## 2014-09-10 DIAGNOSIS — K8020 Calculus of gallbladder without cholecystitis without obstruction: Secondary | ICD-10-CM | POA: Diagnosis not present

## 2014-09-10 DIAGNOSIS — R17 Unspecified jaundice: Secondary | ICD-10-CM

## 2014-09-12 ENCOUNTER — Other Ambulatory Visit: Payer: Self-pay | Admitting: Internal Medicine

## 2014-10-04 ENCOUNTER — Telehealth: Payer: Self-pay | Admitting: *Deleted

## 2014-10-04 ENCOUNTER — Other Ambulatory Visit: Payer: Self-pay | Admitting: Internal Medicine

## 2014-10-04 DIAGNOSIS — R17 Unspecified jaundice: Secondary | ICD-10-CM

## 2014-10-04 DIAGNOSIS — R945 Abnormal results of liver function studies: Secondary | ICD-10-CM

## 2014-10-04 DIAGNOSIS — R7989 Other specified abnormal findings of blood chemistry: Secondary | ICD-10-CM

## 2014-10-04 DIAGNOSIS — Z79899 Other long term (current) drug therapy: Secondary | ICD-10-CM

## 2014-10-04 DIAGNOSIS — R109 Unspecified abdominal pain: Secondary | ICD-10-CM

## 2014-10-04 NOTE — Telephone Encounter (Signed)
[facility] = needs precert on pt for CT ABD PELVIS W/O  appt tomorrow 10/05/14 = [facility]

## 2014-10-04 NOTE — Telephone Encounter (Signed)
Authorization completed [id number]

## 2014-10-05 ENCOUNTER — Ambulatory Visit
Admission: RE | Admit: 2014-10-05 | Discharge: 2014-10-05 | Disposition: A | Discharge status: Home / Self Care | Payer: Medicare Other | Source: Ambulatory Visit | Attending: Internal Medicine | Admitting: Internal Medicine

## 2014-10-05 DIAGNOSIS — R17 Unspecified jaundice: Secondary | ICD-10-CM

## 2014-10-05 DIAGNOSIS — K5730 Diverticulosis of large intestine without perforation or abscess without bleeding: Secondary | ICD-10-CM | POA: Diagnosis not present

## 2014-10-05 DIAGNOSIS — R7989 Other specified abnormal findings of blood chemistry: Secondary | ICD-10-CM | POA: Diagnosis not present

## 2014-10-05 DIAGNOSIS — I313 Pericardial effusion (noninflammatory): Secondary | ICD-10-CM | POA: Diagnosis not present

## 2014-10-10 ENCOUNTER — Other Ambulatory Visit: Payer: Self-pay | Admitting: Internal Medicine

## 2014-10-11 ENCOUNTER — Encounter: Payer: Self-pay | Admitting: Internal Medicine

## 2014-10-11 ENCOUNTER — Ambulatory Visit (INDEPENDENT_AMBULATORY_CARE_PROVIDER_SITE_OTHER): Payer: Medicare Other | Admitting: Internal Medicine

## 2014-10-11 VITALS — BP 118/76 | HR 68 | Temp 97.3°F | Resp 16 | Ht 63.5 in | Wt 214.7 lb

## 2014-10-11 DIAGNOSIS — R109 Unspecified abdominal pain: Secondary | ICD-10-CM

## 2014-10-11 DIAGNOSIS — R17 Unspecified jaundice: Secondary | ICD-10-CM

## 2014-10-11 DIAGNOSIS — R945 Abnormal results of liver function studies: Secondary | ICD-10-CM

## 2014-10-11 DIAGNOSIS — I10 Essential (primary) hypertension: Secondary | ICD-10-CM | POA: Diagnosis not present

## 2014-10-11 DIAGNOSIS — Z79899 Other long term (current) drug therapy: Secondary | ICD-10-CM | POA: Diagnosis not present

## 2014-10-11 DIAGNOSIS — R7989 Other specified abnormal findings of blood chemistry: Secondary | ICD-10-CM | POA: Diagnosis not present

## 2014-10-11 LAB — CBC WITH DIFFERENTIAL/PLATELET
BASOS ABS: 0.1 10*3/uL (ref 0.0–0.1)
Basophils Relative: 1 % (ref 0–1)
Eosinophils Absolute: 0.1 10*3/uL (ref 0.0–0.7)
Eosinophils Relative: 2 % (ref 0–5)
HEMATOCRIT: 39.4 % (ref 36.0–46.0)
Hemoglobin: 12.5 g/dL (ref 12.0–15.0)
LYMPHS ABS: 1.6 10*3/uL (ref 0.7–4.0)
Lymphocytes Relative: 26 % (ref 12–46)
MCH: 27.3 pg (ref 26.0–34.0)
MCHC: 31.7 g/dL (ref 30.0–36.0)
MCV: 86 fL (ref 78.0–100.0)
MONO ABS: 0.3 10*3/uL (ref 0.1–1.0)
MPV: 10.2 fL (ref 8.6–12.4)
Monocytes Relative: 5 % (ref 3–12)
NEUTROS ABS: 4 10*3/uL (ref 1.7–7.7)
Neutrophils Relative %: 66 % (ref 43–77)
Platelets: 243 10*3/uL (ref 150–400)
RBC: 4.58 MIL/uL (ref 3.87–5.11)
RDW: 15.4 % (ref 11.5–15.5)
WBC: 6 10*3/uL (ref 4.0–10.5)

## 2014-10-11 LAB — BASIC METABOLIC PANEL WITH GFR
BUN: 26 mg/dL — ABNORMAL HIGH (ref 6–23)
CALCIUM: 10.4 mg/dL (ref 8.4–10.5)
CO2: 35 mEq/L — ABNORMAL HIGH (ref 19–32)
Chloride: 99 mEq/L (ref 96–112)
Creat: 1.36 mg/dL — ABNORMAL HIGH (ref 0.50–1.10)
GFR, EST AFRICAN AMERICAN: 46 mL/min — AB
GFR, Est Non African American: 40 mL/min — ABNORMAL LOW
Glucose, Bld: 182 mg/dL — ABNORMAL HIGH (ref 70–99)
Potassium: 3.8 mEq/L (ref 3.5–5.3)
Sodium: 143 mEq/L (ref 135–145)

## 2014-10-11 LAB — HEPATIC FUNCTION PANEL
ALK PHOS: 61 U/L (ref 39–117)
ALT: 10 U/L (ref 0–35)
AST: 18 U/L (ref 0–37)
Albumin: 4.2 g/dL (ref 3.5–5.2)
BILIRUBIN DIRECT: 0.2 mg/dL (ref 0.0–0.3)
Indirect Bilirubin: 0.5 mg/dL (ref 0.2–1.2)
Total Bilirubin: 0.7 mg/dL (ref 0.2–1.2)
Total Protein: 6.8 g/dL (ref 6.0–8.3)

## 2014-10-11 NOTE — Patient Instructions (Signed)

## 2014-10-11 NOTE — Progress Notes (Signed)
Subjective:    Patient ID: Alyssa [name], female    DOB: [birth year], 69 y.o.   MRN: [id number]  HPI  Patient had recent evaluation showing elevated LFT's and bilirubin and U/S showed multiple stones. She denies any abd painns or discomfort with food intake.  Repeat labs showed LFT's improving and CT scan showed no significant & in fact showed No gallstones!  Now patient returns for recheck of LFT's and also for 1 month f/u on Sertraline as she was unable to afford the Brintellix due too high co-pay. She adds that she feels much better on the Sertraline and has in fact even lost a few pounds.   Medication Sig  . aspirin 81 MG tablet Take 81 mg by mouth daily.  . Blood Glucose Monitoring Suppl (ACCU-CHEK AVIVA PLUS) W/DEVICE KIT   . brimonidine (ALPHAGAN) 0.15 % ophthalmic solution   . Cholecalciferol (VITAMIN D-3 PO) Take 5,000 Units by mouth every evening.   . Cyanocobalamin (VITAMIN B 12 PO) Take 1,000 mcg by mouth every other day.  . Febuxostat (ULORIC) 80 MG TABS Take 1/2 tablet AD  . FREESTYLE LITE test strip CHECK BLOOD GLUCOSE THREE TIMES DAILY BEFORE A MEAL  . furosemide (LASIX) 40 MG tablet Take 1/2 tablet PO in the morning and 1/2 tablet PO in the evening.  . gabapentin (NEURONTIN) 800 MG tablet TAKE 1 TABLET BY MOUTH TWICE DAILY  . glipiZIDE (GLUCOTROL) 10 MG tablet Take 5-10 mg by mouth 2 (two) times daily as needed (only takes if needed, depending on glucose level >200).   Marland Kitchen latanoprost (XALATAN) 0.005 % ophthalmic solution Place 1 drop into both eyes every evening.   Marland Kitchen levothyroxine (SYNTHROID) 50 MCG tablet Take 1 tablet (50 mcg total) by mouth daily before breakfast.  . losartan (COZAAR) 100 MG tablet Take 1 tablet (100 mg total) by mouth daily.  Marland Kitchen MAGNESIUM-OXIDE 400 (241.3 MG) MG tablet TAKE 1 TABLET BY MOUTH TWICE DAILY  . metFORMIN (GLUCOPHAGE-XR) 500 MG 24 hr tablet Take 1 tablet (500 mg total) by mouth 2 (two) times daily with a meal.  . minoxidil (LONITEN) 10 MG tablet  Take 1 Tablet daily as directed for BP  . Multiple Vitamin (MULTIVITAMIN) tablet Take 1 tablet by mouth daily.  . Omega-3 Fatty Acids (FISH OIL) 1000 MG CAPS Take 1,000 mg by mouth every evening.   . Probiotic Product (PROBIOTIC DAILY PO) Take 1 tablet by mouth every morning.   Marland Kitchen PROLENSA 0.07 % SOLN   . rosuvastatin (CRESTOR) 5 MG tablet Take 1 tablet (5 mg total) by mouth daily.  . sertraline (ZOLOFT) 50 MG tablet Take 1 tablet (50 mg total) by mouth daily.  . timolol (TIMOPTIC) 0.5 % ophthalmic solution Place 1 drop into both eyes 2 (two) times daily.   . traZODone (DESYREL) 150 MG tablet TAKE 1/3 TO 1/2 TO 1 TABLET BY MOUTH EVERY NIGHT AT BEDTIME AS NEEDED FOR SLEEP  . vitamin B-12 (CYANOCOBALAMIN) 1000 MCG tablet Take 1,000 mcg by mouth daily.  Marland Kitchen glipiZIDE (GLUCOTROL) 10 MG tablet TAKE 1 TABLET BY MOUTH THREE TIMES DAILY  . Vortioxetine HBr (BRINTELLIX) 10 MG TABS Take 1/2 tablet PO daily.   Allergies  Allergen Reactions  . Ciprofloxacin Hcl Diarrhea  . Cymbalta [Duloxetine Hcl] Other (See Comments)    Per pt: it did not work  . Codeine Other (See Comments)    Per pt: unknown  . Lortab [Hydrocodone-Acetaminophen] Other (See Comments)    Per pt: unknown  . Oxycodone  Other (See Comments)    Per pt: unknown   Past Medical History  Diagnosis Date  . Osteopenia   . Osteoporosis   . Diabetes mellitus without complication   . Hypertension   . Bipolar 1 disorder   . Hyperlipidemia   . Anemia   . Cataract   . Unspecified vitamin D deficiency   . Glaucoma   . Chronic back pain   . Hypothyroidism    Review of Systems  10 point systems review negative except as above.    Objective:   Physical Exam   BP 118/76 mmHg  Pulse 68  Temp(Src) 97.3 F (36.3 C)  Resp 16  Ht 5' 3.5" (1.613 m)  Wt 214 lb 11.2 oz (97.387 kg)  BMI 37.43 kg/m2  HEENT - Eac's patent. TM's Nl. EOM's full. PERRLA. NasoOroPharynx clear. Neck - supple. Nl Thyroid. Carotids 2+ & No bruits, nodes,  JVD Chest - Clear equal BS w/o Rales, rhonchi, wheezes. Cor - Nl HS. RRR w/o sig MGR. PP 1(+). No edema. Abd - Soft . Rotund. No palpable organomegaly, masses or tenderness. BS nl. MS- FROM w/o deformities. Muscle power, tone and bulk Nl. Limping gait supported by a cane. Neuro - No obvious Cr N abnormalities. Sensory, motor and Cerebellar functions appear Nl w/o focal abnormalities. Psyche - Mental status normal & appropriate.  No delusions, ideations or obvious mood abnormalities.    Assessment & Plan:    1. Essential hypertension  2. Jaundice - recovered  - Hepatic function panel  3. Abnormal LFTs  - Hepatic function panel  4. Abdominal pain, unspecified  - CBC with Differential/Platelet  5. Medication management  - BASIC METABOLIC PANEL WITH GFR  - ROV on previous schedule.

## 2014-11-01 ENCOUNTER — Telehealth: Payer: Self-pay | Admitting: *Deleted

## 2014-11-01 NOTE — Telephone Encounter (Signed)
Patient called and stated her hair is falling out and asked if Zoloft can cause hair loss.  Per Dr Melford Aase, it is not likely.  He advised patient try a low dose iron tab and try hair, skin and nails vitamin.  Patient aware.

## 2014-12-07 ENCOUNTER — Other Ambulatory Visit: Payer: Self-pay | Admitting: Internal Medicine

## 2014-12-14 ENCOUNTER — Other Ambulatory Visit: Payer: Self-pay | Admitting: Emergency Medicine

## 2014-12-14 ENCOUNTER — Other Ambulatory Visit: Payer: Self-pay | Admitting: Internal Medicine

## 2014-12-15 ENCOUNTER — Encounter: Payer: Self-pay | Admitting: Physician Assistant

## 2014-12-15 ENCOUNTER — Ambulatory Visit (INDEPENDENT_AMBULATORY_CARE_PROVIDER_SITE_OTHER): Payer: Medicare Other | Admitting: Physician Assistant

## 2014-12-15 VITALS — BP 138/78 | HR 64 | Temp 97.7°F | Resp 16 | Ht 63.5 in | Wt 216.0 lb

## 2014-12-15 DIAGNOSIS — E039 Hypothyroidism, unspecified: Secondary | ICD-10-CM | POA: Diagnosis not present

## 2014-12-15 DIAGNOSIS — Z79899 Other long term (current) drug therapy: Secondary | ICD-10-CM

## 2014-12-15 DIAGNOSIS — E559 Vitamin D deficiency, unspecified: Secondary | ICD-10-CM

## 2014-12-15 DIAGNOSIS — E785 Hyperlipidemia, unspecified: Secondary | ICD-10-CM

## 2014-12-15 DIAGNOSIS — I10 Essential (primary) hypertension: Secondary | ICD-10-CM | POA: Diagnosis not present

## 2014-12-15 DIAGNOSIS — M7072 Other bursitis of hip, left hip: Secondary | ICD-10-CM

## 2014-12-15 DIAGNOSIS — E119 Type 2 diabetes mellitus without complications: Secondary | ICD-10-CM | POA: Diagnosis not present

## 2014-12-15 DIAGNOSIS — IMO0001 Reserved for inherently not codable concepts without codable children: Secondary | ICD-10-CM

## 2014-12-15 LAB — CBC WITH DIFFERENTIAL/PLATELET
Basophils Absolute: 0 10*3/uL (ref 0.0–0.1)
Basophils Relative: 0 % (ref 0–1)
Eosinophils Absolute: 0.1 10*3/uL (ref 0.0–0.7)
Eosinophils Relative: 2 % (ref 0–5)
HCT: 39.1 % (ref 36.0–46.0)
Hemoglobin: 12.4 g/dL (ref 12.0–15.0)
LYMPHS PCT: 29 % (ref 12–46)
Lymphs Abs: 1.7 10*3/uL (ref 0.7–4.0)
MCH: 27.3 pg (ref 26.0–34.0)
MCHC: 31.7 g/dL (ref 30.0–36.0)
MCV: 85.9 fL (ref 78.0–100.0)
MONOS PCT: 3 % (ref 3–12)
MPV: 9.8 fL (ref 8.6–12.4)
Monocytes Absolute: 0.2 10*3/uL (ref 0.1–1.0)
Neutro Abs: 3.8 10*3/uL (ref 1.7–7.7)
Neutrophils Relative %: 66 % (ref 43–77)
PLATELETS: 288 10*3/uL (ref 150–400)
RBC: 4.55 MIL/uL (ref 3.87–5.11)
RDW: 15.8 % — ABNORMAL HIGH (ref 11.5–15.5)
WBC: 5.8 10*3/uL (ref 4.0–10.5)

## 2014-12-15 LAB — BASIC METABOLIC PANEL WITH GFR
BUN: 32 mg/dL — AB (ref 6–23)
CALCIUM: 10.7 mg/dL — AB (ref 8.4–10.5)
CO2: 35 mEq/L — ABNORMAL HIGH (ref 19–32)
Chloride: 95 mEq/L — ABNORMAL LOW (ref 96–112)
Creat: 1.58 mg/dL — ABNORMAL HIGH (ref 0.50–1.10)
GFR, Est African American: 38 mL/min — ABNORMAL LOW
GFR, Est Non African American: 33 mL/min — ABNORMAL LOW
Glucose, Bld: 205 mg/dL — ABNORMAL HIGH (ref 70–99)
Potassium: 4.2 mEq/L (ref 3.5–5.3)
Sodium: 137 mEq/L (ref 135–145)

## 2014-12-15 LAB — HEPATIC FUNCTION PANEL
ALBUMIN: 4 g/dL (ref 3.5–5.2)
ALT: 11 U/L (ref 0–35)
AST: 17 U/L (ref 0–37)
Alkaline Phosphatase: 64 U/L (ref 39–117)
Bilirubin, Direct: 0.1 mg/dL (ref 0.0–0.3)
Indirect Bilirubin: 0.5 mg/dL (ref 0.2–1.2)
Total Bilirubin: 0.6 mg/dL (ref 0.2–1.2)
Total Protein: 7.2 g/dL (ref 6.0–8.3)

## 2014-12-15 LAB — LIPID PANEL
CHOLESTEROL: 157 mg/dL (ref 0–200)
HDL: 45 mg/dL — ABNORMAL LOW (ref >=46)
LDL Cholesterol: 82 mg/dL (ref 0–99)
Total CHOL/HDL Ratio: 3.5 Ratio
Triglycerides: 150 mg/dL — ABNORMAL HIGH (ref <150)
VLDL: 30 mg/dL (ref 0–40)

## 2014-12-15 LAB — MAGNESIUM: MAGNESIUM: 2.2 mg/dL (ref 1.5–2.5)

## 2014-12-15 LAB — HEMOGLOBIN A1C
HEMOGLOBIN A1C: 8.7 % — AB (ref <5.7)
Mean Plasma Glucose: 203 mg/dL — ABNORMAL HIGH (ref <117)

## 2014-12-15 LAB — TSH: TSH: 3.324 u[IU]/mL (ref 0.350–4.500)

## 2014-12-15 MED ORDER — LEVOTHYROXINE SODIUM 50 MCG PO TABS: ORAL_TABLET | ORAL | Status: DC

## 2014-12-15 MED ORDER — ROSUVASTATIN CALCIUM 10 MG PO TABS: 10.0000 mg | ORAL_TABLET | Freq: Every day | ORAL | Status: DC

## 2014-12-15 MED ORDER — DEXAMETHASONE SODIUM PHOSPHATE 100 MG/10ML IJ SOLN
10.0000 mg | Freq: Once | INTRAMUSCULAR | Status: AC
Start: 2014-12-15 — End: 2014-12-15
  Administered 2014-12-15: 10 mg via INTRAMUSCULAR

## 2014-12-15 MED ORDER — MINOXIDIL 10 MG PO TABS: ORAL_TABLET | ORAL | Status: DC

## 2014-12-15 NOTE — Patient Instructions (Signed)
    Bad carbs also include fruit juice, alcohol, and sweet tea. These are empty calories that do not signal to your brain that you are full.   Please remember the good carbs are still carbs which convert into sugar. So please measure them out no more than 1/2-1 cup of rice, oatmeal, pasta, and beans  Veggies are however free foods! Pile them on.   Not all fruit is created equal. Please see the list below, the fruit at the bottom is higher in sugars than the fruit at the top. Please avoid all dried fruits.      Hip Bursitis Bursitis is a swelling and soreness (inflammation) of a fluid-filled sac (bursa). This sac overlies and protects the joints.  CAUSES  5. Injury. 6. Overuse of the muscles surrounding the joint. 7. Arthritis. 8. Gout. 9. Infection. 10. Cold weather. 11. Inadequate warm-up and conditioning prior to activities. The cause may not be known.  SYMPTOMS   Mild to severe irritation.  Tenderness and swelling over the outside of the hip.  Pain with motion of the hip.  If the bursa becomes infected, a fever may be present. Redness, tenderness, and warmth will develop over the hip. Symptoms usually lessen in 3 to 4 weeks with treatment, but can come back. TREATMENT If conservative treatment does not work, your caregiver may advise draining the bursa and injecting cortisone into the area. This may speed up the healing process. This may also be used as an initial treatment of choice. HOME CARE INSTRUCTIONS   Apply ice to the affected area for 15-20 minutes every 3 to 4 hours while awake for the first 2 days. Put the ice in a plastic bag and place a towel between the bag of ice and your skin.  Rest the painful joint as much as possible, but continue to put the joint through a normal range of motion at least 4 times per day. When the pain lessens, begin normal, slow movements and usual activities to help prevent stiffness of the hip.  Only take over-the-counter or  prescription medicines for pain, discomfort, or fever as directed by your caregiver.  Use crutches to limit weight bearing on the hip joint, if advised.  Elevate your painful hip to reduce swelling. Use pillows for propping and cushioning your legs and hips.  Gentle massage may provide comfort and decrease swelling. SEEK IMMEDIATE MEDICAL CARE IF:   Your pain increases even during treatment, or you are not improving.  You have a fever.  You have heat and inflammation over the involved bursa.  You have any other questions or concerns. MAKE SURE YOU:   Understand these instructions.  Will watch your condition.  Will get help right away if you are not doing well or get worse. Document Released: 12/29/2001 Document Revised: 10/01/2011 Document Reviewed: 07/28/2008 [facility] Patient Information 2015 [place], [place]. This information is not intended to replace advice given to you by your health care provider. Make sure you discuss any questions you have with your health care provider.

## 2014-12-15 NOTE — Progress Notes (Signed)
Assessment and Plan:  1. Hypertension -Continue medication, monitor blood pressure at home. Continue DASH diet.  Reminder to go to the ER if any CP, SOB, nausea, dizziness, severe HA, changes vision/speech, left arm numbness and tingling and jaw pain.  2. Cholesterol -Continue diet and exercise. Check cholesterol.   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 Check A1C  4. Vitamin D Def - check level and continue medications.   5. Obesity with co morbidities - long discussion about weight loss, diet, and exercise  6. Hypothyroidism -check TSH level, continue medications the same, reminded to take on an empty stomach 30-22mns before food.   7. Left Hip Bursitis-  Injection- area cleaned with alcohol, Dexamethasone 17mand 1 CC lidocaine injected into greater trochanteric, tolerated welll with immediate relief.  Mobic, RICE, and exercises given If not better with refer to orthopedics.    Continue diet and meds as discussed. Further disposition pending results of labs. Discussed med's effects and SE's.    Over 30 minutes of exam, counseling, chart review, and critical decision making was performed   HPI [age].o. female  presents for 3 month follow up on hypertension, cholesterol, diabetes and vitamin D deficiency.   Her blood pressure has been controlled at home, today her BP is BP: 138/78 mmHg.  She does not workout. She denies chest pain, shortness of breath, dizziness.  She is on cholesterol medication and denies myalgias. Her cholesterol is at goal. The cholesterol was:   Lab Results  Component Value Date   CHOL 130 08/30/2014   HDL 45 08/30/2014   LDLCALC 59 08/30/2014   TRIG 129 08/30/2014   CHOLHDL 2.9 08/30/2014    She has been working on diet and exercise for diabetes with diabetic chronic kidney disease, she is on bASA, she is on ACE/ARB, and denies  paresthesia of the feet, polydipsia, polyuria and visual disturbances. Last A1C  was:  Lab Results  Component Value Date   HGBA1C 8.2* 08/30/2014    Patient is on Vitamin D supplement. Lab Results  Component Value Date   VD25OH 57432/02/2015   She is on thyroid medication. Her medication was not changed last visit.   Lab Results  Component Value Date   TSH 1.675 08/30/2014    BMI is Body mass index is 37.66 kg/(m^2)., she is working on diet and exercise. Wt Readings from Last 3 Encounters:  12/15/14 216 lb (97.977 kg)  10/11/14 214 lb 11.2 oz (97.387 kg)  09/02/14 216 lb 3.2 oz (98.068 kg)   She had a right sided ischial bursa injection in Sept that helped a lot, no more pain there but she has been having left hip pain worse with lying on it, walking for a long time and would like another injection. No SI pain, no pain down her left leg.  She has non healing area on her right hand and she has some seb derm on her chest she would like removed.    Current Medications:  Current Outpatient Prescriptions on File Prior to Visit  Medication Sig Dispense Refill  . aspirin 81 MG tablet Take 81 mg by mouth daily.    . Blood Glucose Monitoring Suppl (ACCU-CHEK AVIVA PLUS) W/DEVICE KIT   0  . Cholecalciferol (VITAMIN D-3 PO) Take 5,000 Units by mouth every evening.     . Cyanocobalamin (VITAMIN B 12 PO) Take 1,000 mcg by mouth every other day.    . Febuxostat (ULORIC) 80 MG TABS Take  1/2 tablet AD 21 tablet 0  . furosemide (LASIX) 40 MG tablet Take 1/2 tablet PO in the morning and 1/2 tablet PO in the evening. 15 tablet 1  . gabapentin (NEURONTIN) 800 MG tablet TAKE 1 TABLET BY MOUTH TWICE DAILY 60 tablet 5  . glipiZIDE (GLUCOTROL) 10 MG tablet Take 5-10 mg by mouth 2 (two) times daily as needed (only takes if needed, depending on glucose level >200).     Marland Kitchen latanoprost (XALATAN) 0.005 % ophthalmic solution Place 1 drop into both eyes every evening.     Marland Kitchen levothyroxine (SYNTHROID, LEVOTHROID) 50 MCG tablet TAKE 1 TABLET BY MOUTH DAILY BEFORE BREKFAST 90 tablet 1  .  losartan (COZAAR) 100 MG tablet TAKE 1 TABLET BY MOUTH DAILY 90 tablet 0  . MAGNESIUM-OXIDE 400 (241.3 MG) MG tablet TAKE 1 TABLET BY MOUTH TWICE DAILY 180 tablet 99  . metFORMIN (GLUCOPHAGE-XR) 500 MG 24 hr tablet Take 1 tablet (500 mg total) by mouth 2 (two) times daily with a meal. 60 tablet 0  . minoxidil (LONITEN) 10 MG tablet TAKE 1/2 TO 1 TABLET BY MOUTH DAILY AS DIRECTED FOR BLOOD PRESSURE 90 tablet 0  . Multiple Vitamin (MULTIVITAMIN) tablet Take 1 tablet by mouth daily.    . Omega-3 Fatty Acids (FISH OIL) 1000 MG CAPS Take 1,000 mg by mouth every evening.     . Probiotic Product (PROBIOTIC DAILY PO) Take 1 tablet by mouth every morning.     . rosuvastatin (CRESTOR) 5 MG tablet Take 1 tablet (5 mg total) by mouth daily. 42 tablet 0  . sertraline (ZOLOFT) 50 MG tablet Take 1 tablet (50 mg total) by mouth daily. 90 tablet 1  . timolol (TIMOPTIC) 0.5 % ophthalmic solution Place 1 drop into both eyes 2 (two) times daily.     . traZODone (DESYREL) 150 MG tablet TAKE 1/3 TO 1/2 TO 1 TABLET BY MOUTH EVERY NIGHT AT BEDTIME AS NEEDED FOR SLEEP 90 tablet 0   No current facility-administered medications on file prior to visit.   Medical History:  Past Medical History  Diagnosis Date  . Osteopenia   . Osteoporosis   . Diabetes mellitus without complication   . Hypertension   . Bipolar 1 disorder   . Hyperlipidemia   . Anemia   . Cataract   . Unspecified vitamin D deficiency   . Glaucoma   . Chronic back pain   . Hypothyroidism    Allergies:  Allergies  Allergen Reactions  . Ciprofloxacin Hcl Diarrhea  . Cymbalta [Duloxetine Hcl] Other (See Comments)    Per pt: it did not work  . Codeine Other (See Comments)    Per pt: unknown  . Lortab [Hydrocodone-Acetaminophen] Other (See Comments)    Per pt: unknown  . Oxycodone Other (See Comments)    Per pt: unknown    Review of Systems:  Review of Systems  Constitutional: Positive for malaise/fatigue. Negative for fever, chills, weight  loss and diaphoresis.  HENT: Negative.   Respiratory: Negative.   Cardiovascular: Positive for leg swelling. Negative for chest pain, palpitations, orthopnea, claudication and PND.  Gastrointestinal: Negative.   Genitourinary: Negative.   Musculoskeletal: Positive for myalgias and joint pain. Negative for back pain, falls and neck pain.  Skin: Negative.   Neurological: Negative.  Negative for weakness.  Psychiatric/Behavioral: Negative.     Family history- Review and unchanged Social history- Review and unchanged Physical Exam: BP 138/78 mmHg  Pulse 64  Temp(Src) 97.7 F (36.5 C)  Resp 16  Ht 5' 3.5" (1.613 m)  Wt 216 lb (97.977 kg)  BMI 37.66 kg/m2 Wt Readings from Last 3 Encounters:  12/15/14 216 lb (97.977 kg)  10/11/14 214 lb 11.2 oz (97.387 kg)  09/02/14 216 lb 3.2 oz (98.068 kg)   HEENT: normocephalic, sclerae anicteric, TMs pearly, nares patent, no discharge or erythema, pharynx normal Oral cavity: MMM, no lesions Neck: supple, no lymphadenopathy, no thyromegaly, no masses Heart: RRR, normal S1, S2, no murmurs Lungs: CTA bilaterally, no wheezes, rhonchi, or rales Abdomen: +bs, soft, obese non tender, non distended, no masses, no hepatomegaly, no splenomegaly Musculoskeletal: diffuse tenderness, pin point tenderness left greater trocanter Extremities: no edema, no cyanosis, no clubbing Pulses: 2+ symmetric, upper and lower extremities, normal cap refill Neurological: alert, oriented x 3, CN2-12 intact, strength normal upper extremities and lower extremities, sensation normal throughout, DTRs 2+ throughout, no cerebellar signs,walks with walker, unsteady Psychiatric: normal affect, behavior normal, pleasant    Vicie Mutters, PA-C 9:55 AM [facility]

## 2014-12-16 LAB — VITAMIN D 25 HYDROXY (VIT D DEFICIENCY, FRACTURES): Vit D, 25-Hydroxy: 60 ng/mL (ref 30–100)

## 2014-12-22 ENCOUNTER — Encounter: Payer: Self-pay | Admitting: *Deleted

## 2015-01-11 ENCOUNTER — Other Ambulatory Visit: Payer: Self-pay | Admitting: Physician Assistant

## 2015-01-11 ENCOUNTER — Ambulatory Visit (INDEPENDENT_AMBULATORY_CARE_PROVIDER_SITE_OTHER): Payer: Medicare Other | Admitting: Physician Assistant

## 2015-01-11 ENCOUNTER — Encounter: Payer: Self-pay | Admitting: Physician Assistant

## 2015-01-11 ENCOUNTER — Other Ambulatory Visit: Payer: Self-pay | Admitting: Internal Medicine

## 2015-01-11 ENCOUNTER — Ambulatory Visit (HOSPITAL_COMMUNITY)
Admission: RE | Admit: 2015-01-11 | Discharge: 2015-01-11 | Disposition: A | Discharge status: Home / Self Care | Payer: Medicare Other | Source: Ambulatory Visit | Attending: Physician Assistant | Admitting: Physician Assistant

## 2015-01-11 VITALS — BP 148/82 | HR 72 | Temp 97.9°F | Resp 16 | Ht 63.5 in | Wt 225.0 lb

## 2015-01-11 DIAGNOSIS — R319 Hematuria, unspecified: Secondary | ICD-10-CM | POA: Diagnosis not present

## 2015-01-11 DIAGNOSIS — I517 Cardiomegaly: Secondary | ICD-10-CM | POA: Insufficient documentation

## 2015-01-11 DIAGNOSIS — R0602 Shortness of breath: Secondary | ICD-10-CM | POA: Diagnosis not present

## 2015-01-11 DIAGNOSIS — J984 Other disorders of lung: Secondary | ICD-10-CM | POA: Insufficient documentation

## 2015-01-11 DIAGNOSIS — M25559 Pain in unspecified hip: Secondary | ICD-10-CM

## 2015-01-11 DIAGNOSIS — J449 Chronic obstructive pulmonary disease, unspecified: Secondary | ICD-10-CM | POA: Diagnosis not present

## 2015-01-11 DIAGNOSIS — J90 Pleural effusion, not elsewhere classified: Secondary | ICD-10-CM | POA: Insufficient documentation

## 2015-01-11 DIAGNOSIS — G4700 Insomnia, unspecified: Secondary | ICD-10-CM

## 2015-01-11 DIAGNOSIS — R5383 Other fatigue: Secondary | ICD-10-CM

## 2015-01-11 DIAGNOSIS — Z79899 Other long term (current) drug therapy: Secondary | ICD-10-CM

## 2015-01-11 DIAGNOSIS — I10 Essential (primary) hypertension: Secondary | ICD-10-CM

## 2015-01-11 DIAGNOSIS — J9811 Atelectasis: Secondary | ICD-10-CM | POA: Diagnosis not present

## 2015-01-11 LAB — TSH: TSH: 2.779 u[IU]/mL (ref 0.350–4.500)

## 2015-01-11 LAB — CBC WITH DIFFERENTIAL/PLATELET
BASOS ABS: 0 10*3/uL (ref 0.0–0.1)
Basophils Relative: 0 % (ref 0–1)
EOS PCT: 2 % (ref 0–5)
Eosinophils Absolute: 0.1 10*3/uL (ref 0.0–0.7)
HCT: 37.5 % (ref 36.0–46.0)
Hemoglobin: 12.2 g/dL (ref 12.0–15.0)
LYMPHS PCT: 28 % (ref 12–46)
Lymphs Abs: 1.5 10*3/uL (ref 0.7–4.0)
MCH: 28 pg (ref 26.0–34.0)
MCHC: 32.5 g/dL (ref 30.0–36.0)
MCV: 86 fL (ref 78.0–100.0)
MPV: 10 fL (ref 8.6–12.4)
Monocytes Absolute: 0.3 10*3/uL (ref 0.1–1.0)
Monocytes Relative: 5 % (ref 3–12)
NEUTROS ABS: 3.4 10*3/uL (ref 1.7–7.7)
NEUTROS PCT: 65 % (ref 43–77)
PLATELETS: 251 10*3/uL (ref 150–400)
RBC: 4.36 MIL/uL (ref 3.87–5.11)
RDW: 15.6 % — ABNORMAL HIGH (ref 11.5–15.5)
WBC: 5.3 10*3/uL (ref 4.0–10.5)

## 2015-01-11 LAB — HEPATIC FUNCTION PANEL
ALT: 27 U/L (ref 0–35)
AST: 22 U/L (ref 0–37)
Albumin: 3.8 g/dL (ref 3.5–5.2)
Alkaline Phosphatase: 76 U/L (ref 39–117)
BILIRUBIN INDIRECT: 0.5 mg/dL (ref 0.2–1.2)
Bilirubin, Direct: 0.2 mg/dL (ref 0.0–0.3)
TOTAL PROTEIN: 6.7 g/dL (ref 6.0–8.3)
Total Bilirubin: 0.7 mg/dL (ref 0.2–1.2)

## 2015-01-11 LAB — BASIC METABOLIC PANEL WITH GFR
BUN: 24 mg/dL — ABNORMAL HIGH (ref 6–23)
CO2: 33 mEq/L — ABNORMAL HIGH (ref 19–32)
Calcium: 10.3 mg/dL (ref 8.4–10.5)
Chloride: 99 mEq/L (ref 96–112)
Creat: 1.29 mg/dL — ABNORMAL HIGH (ref 0.50–1.10)
GFR, EST AFRICAN AMERICAN: 48 mL/min — AB
GFR, Est Non African American: 42 mL/min — ABNORMAL LOW
GLUCOSE: 199 mg/dL — AB (ref 70–99)
Potassium: 3.7 mEq/L (ref 3.5–5.3)
SODIUM: 145 meq/L (ref 135–145)

## 2015-01-11 LAB — MAGNESIUM: Magnesium: 1.8 mg/dL (ref 1.5–2.5)

## 2015-01-11 MED ORDER — AZITHROMYCIN 250 MG PO TABS: ORAL_TABLET | ORAL | Status: AC

## 2015-01-11 NOTE — Progress Notes (Signed)
Subjective:    Patient ID: Alyssa [name], female    DOB: [birth year], 69 y.o.   MRN: [id number]  HPI 69 y.o. non smoking obese WF with history of HTN, chol, DM with CKD, TSH presents with blood in her urine. She noticed blood clots in her urine in the toilet, has had pink on her tissues. Has frequency but denies dysuria, AB pain. Feels very bloated and uncomfortable in her AB.  She has had fatigue since Sunday.   Normal AB [name] 09/10/2014, normal CT AB 09/2014, showed uterine fibroid, did not visualized ovaries.  Complains of lower back pain and bilateral hip pain, worse with walking, better with resting. Injections have helped in the past.   She also complains of some fatigue and shortness of breath, denies chest pain. Denies orthopnea, PND, and has had swelling. Echo shows normal EF, but pulmonary hypertension with elevated RSVP and diastolic dysfunction.  Wt Readings from Last 3 Encounters:  01/11/15 225 lb (102.059 kg)  12/15/14 216 lb (97.977 kg)  10/11/14 214 lb 11.2 oz (97.387 kg)    Blood pressure 148/82, pulse 72, temperature 97.9 F (36.6 C), resp. rate 16, height 5' 3.5" (1.613 m), weight 225 lb (102.059 kg).  Current Outpatient Prescriptions on File Prior to Visit  Medication Sig Dispense Refill  . aspirin 81 MG tablet Take 81 mg by mouth daily.    . Blood Glucose Monitoring Suppl (ACCU-CHEK AVIVA PLUS) W/DEVICE KIT   0  . Cholecalciferol (VITAMIN D-3 PO) Take 5,000 Units by mouth every evening.     . Cyanocobalamin (VITAMIN B 12 PO) Take 1,000 mcg by mouth every other day.    . Febuxostat (ULORIC) 80 MG TABS Take 1/2 tablet AD 21 tablet 0  . furosemide (LASIX) 40 MG tablet Take 1/2 tablet PO in the morning and 1/2 tablet PO in the evening. 15 tablet 1  . gabapentin (NEURONTIN) 800 MG tablet TAKE 1 TABLET BY MOUTH TWICE DAILY 60 tablet 5  . glipiZIDE (GLUCOTROL) 10 MG tablet Take 5-10 mg by mouth 2 (two) times daily as needed (only takes if needed, depending on glucose level  >200).     Marland Kitchen latanoprost (XALATAN) 0.005 % ophthalmic solution Place 1 drop into both eyes every evening.     Marland Kitchen levothyroxine (SYNTHROID, LEVOTHROID) 50 MCG tablet TAKE 1 TABLET BY MOUTH DAILY BEFORE BREKFAST 90 tablet 1  . losartan (COZAAR) 100 MG tablet TAKE 1 TABLET BY MOUTH DAILY 90 tablet 0  . MAGNESIUM-OXIDE 400 (241.3 MG) MG tablet TAKE 1 TABLET BY MOUTH TWICE DAILY 180 tablet 99  . metFORMIN (GLUCOPHAGE-XR) 500 MG 24 hr tablet Take 1 tablet (500 mg total) by mouth 2 (two) times daily with a meal. 60 tablet 0  . minoxidil (LONITEN) 10 MG tablet TAKE 1/2 TO 1 TABLET BY MOUTH DAILY AS DIRECTED FOR BLOOD PRESSURE 90 tablet 0  . Multiple Vitamin (MULTIVITAMIN) tablet Take 1 tablet by mouth daily.    . Omega-3 Fatty Acids (FISH OIL) 1000 MG CAPS Take 1,000 mg by mouth every evening.     . Probiotic Product (PROBIOTIC DAILY PO) Take 1 tablet by mouth every morning.     . rosuvastatin (CRESTOR) 10 MG tablet Take 1 tablet (10 mg total) by mouth daily. 30 tablet 3  . sertraline (ZOLOFT) 50 MG tablet Take 1 tablet (50 mg total) by mouth daily. 90 tablet 1  . timolol (TIMOPTIC) 0.5 % ophthalmic solution Place 1 drop into both eyes 2 (two) times daily.     Marland Kitchen  traZODone (DESYREL) 150 MG tablet TAKE 1/3 TO 1/2 TO 1 TABLET BY MOUTH EVERY NIGHT AT BEDTIME AS NEEDED FOR SLEEP 90 tablet 0   No current facility-administered medications on file prior to visit.    Past Medical History  Diagnosis Date  . Osteopenia   . Osteoporosis   . Diabetes mellitus without complication   . Hypertension   . Bipolar 1 disorder   . Hyperlipidemia   . Anemia   . Cataract   . Unspecified vitamin D deficiency   . Glaucoma   . Chronic back pain   . Hypothyroidism    Allergies Allergies  Allergen Reactions  . Ciprofloxacin Hcl Diarrhea  . Cymbalta [Duloxetine Hcl] Other (See Comments)    Per pt: it did not work  . Codeine Other (See Comments)    Per pt: unknown  . Lortab [Hydrocodone-Acetaminophen] Other (See  Comments)    Per pt: unknown  . Oxycodone Other (See Comments)    Per pt: unknown   Surgical history Past Surgical History  Procedure Laterality Date  . Back surgery  2007  . Knee arthroscopy Bilateral   . Dilation and curettage of uterus    . Breast surgery Left     CYST REMOVAL  . Lumbar fusion  2009   Family history Family History  Problem Relation Age of Onset  . Adopted: Yes    Review of Systems  Constitutional: Positive for fatigue and unexpected weight change (weight gain). Negative for fever, chills, diaphoresis, activity change and appetite change.  HENT: Negative.   Respiratory: Positive for cough and shortness of breath. Negative for apnea, choking, chest tightness, wheezing and stridor.   Cardiovascular: Positive for leg swelling. Negative for chest pain and palpitations.  Gastrointestinal: Negative.   Genitourinary: Positive for frequency and hematuria. Negative for dysuria, urgency, flank pain, decreased urine volume, vaginal bleeding, vaginal discharge, enuresis, difficulty urinating, vaginal pain, pelvic pain and dyspareunia.  Musculoskeletal: Positive for myalgias and gait problem. Negative for back pain, joint swelling, arthralgias, neck pain and neck stiffness.  Skin: Negative.   Neurological: Negative.  Negative for weakness.  Psychiatric/Behavioral: Negative.        Objective:   Physical Exam  Constitutional: She is oriented to person, place, and time. She appears well-developed and well-nourished. She does not have a sickly appearance. No distress.  HENT:  Nose: Left sinus exhibits frontal sinus tenderness.  Mouth/Throat: Uvula is midline and oropharynx is clear and moist. No oropharyngeal exudate.  Neck: Trachea normal, normal range of motion and phonation normal. Neck supple. No tracheal tenderness present. No tracheal deviation present.  Cardiovascular: Normal rate, regular rhythm and normal heart sounds.   No murmur heard. Pulmonary/Chest: Effort  normal. No stridor. No respiratory distress. She has no decreased breath sounds. She has wheezes (course breath sounds bilateral bases). She has no rhonchi. She has no rales. She exhibits no tenderness.  02 RA 93%  Abdominal: Soft. Bowel sounds are normal. There is no tenderness. There is no rebound and no guarding.  Obese abdomen  Neurological: She is alert and oriented to person, place, and time. She has normal strength.  Gait is antalgic.   Skin: Skin is warm and dry. No rash noted. She is not diaphoretic.  Psychiatric: She has a normal mood and affect.  Vitals reviewed.      Assessment & Plan:  Hematuria- will check urine, if negative may want to evaluate uterus/ovaries and get pelvis US  SOB/fatigue- lungs sound coarse, 02 93% RA, weight  is up 10 lbs over 1 month- suggest increase lasix to 1 pill in the AM and 1/2 at night for 2 weeks, check CXR since BNP may be falsely elevated dye to CKD, will give zpak.    Over 30 minutes of exam, counseling, chart review and high complexity critical decision making was performed

## 2015-01-11 NOTE — Patient Instructions (Signed)
Increase your lasix to 1 pill in the morning and 1/2 at night for 3-5 days.   Do the following things EVERYDAY: 1) Weigh yourself in the morning before breakfast. Write it down and keep it in a log. 2) Take your medicines as prescribed 3) Eat low salt foods-Limit salt (sodium) to 2000 mg per day. Best thing to do is avoid processed foods.   4) Stay as active as you can everyday 5) Limit all fluids for the day to less than 2 liters  Call your doctor if:  Anytime you have any of the following symptoms:  1) 3 pound weight gain in 24 hours or 5 pounds in 1 week  2) shortness of breath, with or without a dry hacking cough  3) swelling in the hands, feet or stomach  4) if you have to sleep on extra pillows at night in order to breathe. 5) after laying down at night for 20-30 mins, you wake up short of breath.   These can all be signs of fluid overload.   Hematuria Hematuria is blood in your urine. It can be caused by a bladder infection, kidney infection, prostate infection, kidney stone, or cancer of your urinary tract. Infections can usually be treated with medicine, and a kidney stone usually will pass through your urine. If neither of these is the cause of your hematuria, further workup to find out the reason may be needed. It is very important that you tell your health care provider about any blood you see in your urine, even if the blood stops without treatment or happens without causing pain. Blood in your urine that happens and then stops and then happens again can be a symptom of a very serious condition. Also, pain is not a symptom in the initial stages of many urinary cancers. HOME CARE INSTRUCTIONS   Drink lots of fluid, 3-4 quarts a day. If you have been diagnosed with an infection, cranberry juice is especially recommended, in addition to large amounts of water.  Avoid caffeine, tea, and carbonated beverages because they tend to irritate the bladder.  Avoid alcohol because it may  irritate the prostate.  Take all medicines as directed by your health care provider.  If you were prescribed an antibiotic medicine, finish it all even if you start to feel better.  If you have been diagnosed with a kidney stone, follow your health care provider's instructions regarding straining your urine to catch the stone.  Empty your bladder often. Avoid holding urine for long periods of time.  After a bowel movement, women should cleanse front to back. Use each tissue only once.  Empty your bladder before and after sexual intercourse if you are a female. SEEK MEDICAL CARE IF:  You develop back pain.  You have a fever.  You have a feeling of sickness in your stomach (nausea) or vomiting.  Your symptoms are not better in 3 days. Return sooner if you are getting worse. SEEK IMMEDIATE MEDICAL CARE IF:   You develop severe vomiting and are unable to keep the medicine down.  You develop severe back or abdominal pain despite taking your medicines.  You begin passing a large amount of blood or clots in your urine.  You feel extremely weak or faint, or you pass out. MAKE SURE YOU:   Understand these instructions.  Will watch your condition.  Will get help right away if you are not doing well or get worse. Document Released: 07/09/2005 Document Revised: 11/23/2013 Document Reviewed:  03/09/2013 [employer] Patient Information 2015 [place], [place]. This information is not intended to replace advice given to you by your health care provider. Make sure you discuss any questions you have with your health care provider.

## 2015-01-12 LAB — URINALYSIS, ROUTINE W REFLEX MICROSCOPIC
Bilirubin Urine: NEGATIVE
GLUCOSE, UA: NEGATIVE mg/dL
KETONES UR: NEGATIVE mg/dL
Leukocytes, UA: NEGATIVE
Nitrite: NEGATIVE
PH: 6 (ref 5.0–8.0)
Protein, ur: >300 mg/dL — AB
Specific Gravity, Urine: <1.005 — ABNORMAL LOW (ref 1.005–1.030)
Urobilinogen, UA: 0.2 mg/dL (ref 0.0–1.0)

## 2015-01-12 LAB — URINALYSIS, MICROSCOPIC ONLY
BACTERIA UA: NONE SEEN
Crystals: NONE SEEN
Squamous Epithelial / LPF: NONE SEEN

## 2015-01-12 LAB — URINE CULTURE
COLONY COUNT: NO GROWTH
ORGANISM ID, BACTERIA: NO GROWTH

## 2015-01-17 DIAGNOSIS — H25011 Cortical age-related cataract, right eye: Secondary | ICD-10-CM | POA: Diagnosis not present

## 2015-01-17 DIAGNOSIS — H4011X1 Primary open-angle glaucoma, mild stage: Secondary | ICD-10-CM | POA: Diagnosis not present

## 2015-01-17 DIAGNOSIS — E11329 Type 2 diabetes mellitus with mild nonproliferative diabetic retinopathy without macular edema: Secondary | ICD-10-CM | POA: Diagnosis not present

## 2015-01-17 DIAGNOSIS — H52203 Unspecified astigmatism, bilateral: Secondary | ICD-10-CM | POA: Diagnosis not present

## 2015-01-19 ENCOUNTER — Ambulatory Visit: Payer: Self-pay

## 2015-01-21 HISTORY — PX: EYE SURGERY: SHX253

## 2015-01-24 ENCOUNTER — Encounter: Payer: Self-pay | Admitting: *Deleted

## 2015-01-31 ENCOUNTER — Other Ambulatory Visit: Payer: Self-pay | Admitting: *Deleted

## 2015-01-31 ENCOUNTER — Other Ambulatory Visit: Payer: Self-pay | Admitting: Internal Medicine

## 2015-01-31 MED ORDER — ATORVASTATIN CALCIUM 80 MG PO TABS: 80.0000 mg | ORAL_TABLET | Freq: Every day | ORAL | Status: DC

## 2015-02-09 ENCOUNTER — Encounter: Payer: Self-pay | Admitting: Physician Assistant

## 2015-02-09 DIAGNOSIS — Z0000 Encounter for general adult medical examination without abnormal findings: Secondary | ICD-10-CM | POA: Insufficient documentation

## 2015-02-10 ENCOUNTER — Ambulatory Visit: Payer: Self-pay | Admitting: Physician Assistant

## 2015-02-14 ENCOUNTER — Ambulatory Visit (INDEPENDENT_AMBULATORY_CARE_PROVIDER_SITE_OTHER): Payer: Medicare Other | Admitting: Physician Assistant

## 2015-02-14 ENCOUNTER — Encounter: Payer: Self-pay | Admitting: Physician Assistant

## 2015-02-14 VITALS — BP 138/86 | HR 64 | Temp 98.2°F | Resp 18 | Ht 63.5 in | Wt 230.0 lb

## 2015-02-14 DIAGNOSIS — R6889 Other general symptoms and signs: Secondary | ICD-10-CM | POA: Diagnosis not present

## 2015-02-14 DIAGNOSIS — Z0000 Encounter for general adult medical examination without abnormal findings: Secondary | ICD-10-CM

## 2015-02-14 DIAGNOSIS — H269 Unspecified cataract: Secondary | ICD-10-CM

## 2015-02-14 DIAGNOSIS — D649 Anemia, unspecified: Secondary | ICD-10-CM

## 2015-02-14 DIAGNOSIS — I5031 Acute diastolic (congestive) heart failure: Secondary | ICD-10-CM

## 2015-02-14 DIAGNOSIS — I10 Essential (primary) hypertension: Secondary | ICD-10-CM | POA: Diagnosis not present

## 2015-02-14 DIAGNOSIS — N183 Chronic kidney disease, stage 3 unspecified: Secondary | ICD-10-CM

## 2015-02-14 DIAGNOSIS — F319 Bipolar disorder, unspecified: Secondary | ICD-10-CM

## 2015-02-14 DIAGNOSIS — E785 Hyperlipidemia, unspecified: Secondary | ICD-10-CM

## 2015-02-14 DIAGNOSIS — Z9181 History of falling: Secondary | ICD-10-CM

## 2015-02-14 DIAGNOSIS — Z0001 Encounter for general adult medical examination with abnormal findings: Secondary | ICD-10-CM | POA: Diagnosis not present

## 2015-02-14 DIAGNOSIS — F329 Major depressive disorder, single episode, unspecified: Secondary | ICD-10-CM

## 2015-02-14 DIAGNOSIS — R319 Hematuria, unspecified: Secondary | ICD-10-CM | POA: Diagnosis not present

## 2015-02-14 DIAGNOSIS — Z1331 Encounter for screening for depression: Secondary | ICD-10-CM

## 2015-02-14 DIAGNOSIS — E559 Vitamin D deficiency, unspecified: Secondary | ICD-10-CM

## 2015-02-14 DIAGNOSIS — F32A Depression, unspecified: Secondary | ICD-10-CM

## 2015-02-14 DIAGNOSIS — E039 Hypothyroidism, unspecified: Secondary | ICD-10-CM

## 2015-02-14 DIAGNOSIS — M810 Age-related osteoporosis without current pathological fracture: Secondary | ICD-10-CM

## 2015-02-14 DIAGNOSIS — IMO0001 Reserved for inherently not codable concepts without codable children: Secondary | ICD-10-CM

## 2015-02-14 DIAGNOSIS — Z79899 Other long term (current) drug therapy: Secondary | ICD-10-CM

## 2015-02-14 DIAGNOSIS — R251 Tremor, unspecified: Secondary | ICD-10-CM

## 2015-02-14 DIAGNOSIS — M1000 Idiopathic gout, unspecified site: Secondary | ICD-10-CM

## 2015-02-14 MED ORDER — GABAPENTIN 800 MG PO TABS: 800.0000 mg | ORAL_TABLET | Freq: Two times a day (BID) | ORAL | Status: DC

## 2015-02-14 NOTE — Patient Instructions (Addendum)
Take the lipitor 1/2 pill in the morning.   Take 2 of the lasix a day for 3-5 days until your weight is down.   Cut the gabapentin in half in the morning and can cut in half at night OR take 1 pill to see if this helps with the hallucinations.  Continue zoloft the same.    We are checking your urine for blood and if it still has blood in it we will be sending you to [facility].   [employer] with no obligation # [phone] Do not have to be a member Tues-Sat 10-6  [place]- free test with no obligation # [phone] MUST BE A MEMBER Call for store hours

## 2015-02-14 NOTE — Progress Notes (Signed)
MEDICARE ANNUAL WELLNESS VISIT AND FOLLOW UP  Assessment:   1. Acute diastolic congestive heart failure Weight increased, + edema Increase lasix to BID for 5 days and then go to 1 a day.   2. Essential hypertension - CBC with Differential - BASIC METABOLIC PANEL WITH GFR - Hepatic function panel - TSH - Microalbumin / creatinine urine ratio  3. Osteoporosis Up to date on Dexa, better  4. Chronic renal insufficiency, stage III (moderate) Due to DM, check BMP, wants referral to kidney doctor  5. Bipolar 1 disorder Continue follow up/medications  6. Anemia, unspecified anemia type Check CBC  7. Depression Continue follow up, continue medications  8. Hyperlipidemia -continue medications, check lipids, decrease fatty foods, increase activity.  - Lipid panel  9. Obesity, morbid Obesity with co morbidities- long discussion about weight loss, diet, and exercise  10. Unspecified vitamin D deficiency - Vit D  25 hydroxy (rtn osteoporosis monitoring)  11. Type II or unspecified type diabetes mellitus with renal manifestations, not stated as uncontrolled Discussed general issues about diabetes pathophysiology and management., Educational material distributed., Suggested low cholesterol diet., Encouraged aerobic exercise., Discussed foot care., Reminded to get yearly retinal exam. - Hemoglobin A1c - HM DIABETES FOOT EXAM  8. Vitamin D deficiency  9. Encounter for long-term (current) use of medications  10. Tremor resolved  11. Idiopathic gout, unspecified chronicity, unspecified site Check next OV  13. Anemia, unspecified anemia type - monitor, continue iron supp with Vitamin C and increase green leafy veggies  14. Cataract Scheduled for removal August 18th  15. Depression, controlled Continue zoloft 50, may need to cut in half  18. Encounter for Medicare annual wellness exam  19. Hematuria May need referral to urology - Urinalysis, Routine w reflex microscopic  (not at [facility]) - Urine culture  20. Depression screen negative  21. At high risk for falls Fall risk discussed  22. Hallucinations Not as bad as before but will decrease gabapentin and follow up in 1 month Dr. Melford [name]  Declines labs, states will get in a month.    Plan:   During the course of the visit the patient was educated and counseled about appropriate screening and preventive services including:    Pneumococcal vaccine   Influenza vaccine  Td vaccine  Screening electrocardiogram  Screening mammography  Bone densitometry screening  Colorectal cancer screening  Diabetes screening  Glaucoma screening  Nutrition counseling   Advanced directives: given info/requested  Conditions/risks identified: BMI: Discussed weight loss, diet, and increase physical activity.  Increase physical activity: AHA recommends 150 minutes of physical activity a week.  Medications reviewed DEXA- up to date Diabetes is not at goal, ACE/ARB therapy: Yes. Urinary Incontinence is an issue: discussed non pharmacology and pharmacology options.  Fall risk: high- discussed PT, home fall assessment, medications. She states she can not afford PT at this time, if she decides to get it she will call   Subjective:   Alyssa [name] is a 69 y.o. female who presents for Medicare Annual Wellness Visit and 4 week follow up Date of last medicare wellness visit was 03/2014  She was seen 1 month ago and had SOB, her weight was up 10 lbs so her lasix was increase to 1 pill AM and 1/2 pill PM for 2 weeks and given zpak for COPD.  CXr showed COPD and bilateral plerual effusions. She states she is feeling better, her weight is still elevated, she still has swelling, she has only been taking 1 a day.  Wt Readings from Last 3 Encounters:  02/14/15 230 lb (104.327 kg)  01/11/15 225 lb (102.059 kg)  12/15/14 216 lb (97.977 kg)   She has had hematuria on last 3 urines with normal AB [name] 09/10/2014, normal  CT AB 2016 showed uterine fibroids only. Will refer to urology.  Her blood pressure has been controlled at home, today their BP is BP: 138/86 mmHg She does not workout due to pain. She denies chest pain, shortness of breath, dizziness.  She is on cholesterol medication and denies myalgias. Her cholesterol is at goal. The cholesterol last visit was:   Lab Results  Component Value Date   CHOL 157 12/15/2014   HDL 45* 12/15/2014   LDLCALC 82 12/15/2014   TRIG 150* 12/15/2014   CHOLHDL 3.5 12/15/2014   She has not been working on diet and exercise for diabetes, she has CKD stage III from DM, she is on metformin/glipizide and ARB, denies hypoglycemia, and denies polydipsia and polyuria. Last A1C in the office was:  Lab Results  Component Value Date   HGBA1C 8.7* 12/15/2014   Patient is on Vitamin D supplement. Lab Results  Component Value Date   VD25OH 60 12/15/2014     She has been seeing Dr. Krista Blue for worsening memory and had MRI that showed vascular dementia. She is on zoloft 51m daily, off brintellix due to hallucinations.  She states that occ while sitting down to the toilet she will have hallucinations, she will see the bathroom getting bigger, see a rug on the wall. Can happen anytime.   She states she has pain everywhere, in her hands, hips, and right buttock. She can not walk by herself, walks with walker, she was doing water aerobics which helped but she was unable to continue do to cost.  She is on thyroid medication. Her medication was not changed last visit. Patient denies nervousness and palpitations.  Lab Results  Component Value Date   TSH 2.779 01/11/2015  .   Names of Other Physician/Practitioners you currently use: 1. [facility]- here for primary care 2. none, dentist, last visit years Patient Care Team: WUnk Pinto MD as PCP - General (Internal Medicine) DLafayette Dragon MD as Consulting Physician (Gastroenterology) YMarcial Pacas MD as Consulting Physician (Neurology) JEstevan Ryder MD as Referring Physician (Cardiology) CRoselie Awkward MD as Consulting Physician (Ophthalmology)- yesterday- getting cataract removal KJari Pigg MD as Consulting Physician (Dermatology)  Medication Review Current Outpatient Prescriptions on File Prior to Visit  Medication Sig Dispense Refill  . ACCU-CHEK AVIVA PLUS test strip     . aspirin 81 MG tablet Take 81 mg by mouth daily.    .Marland Kitchenatorvastatin (LIPITOR) 80 MG tablet Take 1 tablet (80 mg total) by mouth daily. 30 tablet 1  . Blood Glucose Monitoring Suppl (ACCU-CHEK AVIVA PLUS) W/DEVICE KIT   0  . Cholecalciferol (VITAMIN D-3 PO) Take 5,000 Units by mouth every evening.     . Cyanocobalamin (VITAMIN B 12 PO) Take 1,000 mcg by mouth every other day.    . Febuxostat (ULORIC) 80 MG TABS Take 1/2 tablet AD 21 tablet 0  . furosemide (LASIX) 40 MG tablet TAKE 1 TABLET BY MOUTH TWICE DAILY 180 tablet 1  . gabapentin (NEURONTIN) 800 MG tablet TAKE 1 TABLET BY MOUTH TWICE DAILY 60 tablet 5  . glipiZIDE (GLUCOTROL) 10 MG tablet Take 5-10 mg by mouth 2 (two) times daily as needed (only takes if needed, depending on glucose level >  200).     . latanoprost (XALATAN) 0.005 % ophthalmic solution Place 1 drop into both eyes every evening.     Marland Kitchen levothyroxine (SYNTHROID, LEVOTHROID) 50 MCG tablet TAKE 1 TABLET BY MOUTH DAILY BEFORE BREKFAST 90 tablet 1  . losartan (COZAAR) 100 MG tablet TAKE 1 TABLET BY MOUTH DAILY 90 tablet 0  . MAGNESIUM-OXIDE 400 (241.3 MG) MG tablet TAKE 1 TABLET BY MOUTH TWICE DAILY 180 tablet 99  . metFORMIN (GLUCOPHAGE-XR) 500 MG 24 hr tablet Take 1 tablet (500 mg total) by mouth 2 (two) times daily with a meal. 60 tablet 0  . minoxidil (LONITEN) 10 MG tablet TAKE 1/2 TO 1 TABLET BY MOUTH DAILY AS DIRECTED FOR BLOOD PRESSURE 90 tablet 0  . Multiple Vitamin (MULTIVITAMIN) tablet Take 1 tablet by mouth daily.    . Omega-3 Fatty Acids (FISH OIL) 1000 MG CAPS Take 1,000 mg  by mouth every evening.     . Probiotic Product (PROBIOTIC DAILY PO) Take 1 tablet by mouth every morning.     . sertraline (ZOLOFT) 50 MG tablet Take 1 tablet (50 mg total) by mouth daily. 90 tablet 1  . timolol (TIMOPTIC) 0.5 % ophthalmic solution Place 1 drop into both eyes 2 (two) times daily.     . traZODone (DESYREL) 150 MG tablet TAKE 1/3 TO 1/2 TO 1 TABLET BY MOUTH EVERY NIGHT AT BEDTIME AS NEEDED FOR SLEEP 90 tablet 1   No current facility-administered medications on file prior to visit.    Current Problems (verified) Patient Active Problem List   Diagnosis Date Noted  . Encounter for Medicare annual wellness exam 02/09/2015  . Encounter for long-term (current) use of medications 07/14/2014  . Hypothyroidism 07/14/2014  . CKD (chronic kidney disease) stage 3, GFR 30-59 ml/min 06/24/2014  . Gout 06/24/2014  . Tremor 01/12/2014  . Acute diastolic congestive heart failure 10/13/2013  . Community acquired pneumonia 10/12/2013  . Acute encephalopathy- resolved 10/12/2013  . Obesity, morbid 07/15/2013  . Depression, controlled 07/15/2013  . Vitamin D deficiency   . Osteoporosis   . 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 Hypertension   . Bipolar 1 disorder   . Hyperlipidemia   . Anemia   . Cataract     Screening Tests Health Maintenance  Topic Date Due  . TETANUS/TDAP  07/23/2012  . INFLUENZA VACCINE  02/21/2015  . HEMOGLOBIN A1C  06/17/2015  . URINE MICROALBUMIN  08/31/2015  . FOOT EXAM  09/03/2015  . MAMMOGRAM  12/26/2015  . OPHTHALMOLOGY EXAM  01/17/2016  . COLONOSCOPY  10/08/2018  . DEXA SCAN  Completed  . ZOSTAVAX  Completed  . PNA vac Low Risk Adult  Completed     Immunization History  Administered Date(s) Administered  . DT 05/18/2014  . Influenza Split 04/22/2013  . Influenza, High Dose Seasonal PF 04/01/2014  . Pneumococcal Conjugate-13 09/02/2014  . Pneumococcal Polysaccharide-23 07/23/2001, 07/23/2010  . Td 07/23/2002  .  Zoster 07/23/2006    Preventative care: Last colonoscopy: 09/2013 Last mammogram: 12/2013 scheduled with Dr. Harvie Bridge on the 10th Last pap smear/pelvic exam: 2007  DEXA:12/2013 due 2017 Myoview stress normal 09/2013, EF 65% Renal US normal 09/2013  Prior vaccinations: TD or Tdap: 2015 Influenza: 2015 Pneumococcal: 2012  Prevnar 13: 2016 Shingles/Zostavax: 2008  Allergies Allergies  Allergen Reactions  . Ciprofloxacin Hcl Diarrhea  . Cymbalta [Duloxetine Hcl] Other (See Comments)    Per pt: it did not work  . Codeine Other (See Comments)  Per pt: unknown  . Lortab [Hydrocodone-Acetaminophen] Other (See Comments)    Per pt: unknown  . Oxycodone Other (See Comments)    Per pt: unknown   Surgical history Past Surgical History  Procedure Laterality Date  . Back surgery  2007  . Knee arthroscopy Bilateral   . Dilation and curettage of uterus    . Breast surgery Left     CYST REMOVAL  . Lumbar fusion  2009   Family history Family History  Problem Relation Age of Onset  . Adopted: Yes   Tobacco History  Substance Use Topics  . Smoking status: Never Smoker   . Smokeless tobacco: Never Used  . Alcohol Use: Yes     Comment: social drinker- glass of wine or cocktail   MEDICARE WELLNESS OBJECTIVES: Tobacco use: She does not smoke.  Patient is not a former smoker. If yes, counseling given Alcohol Current alcohol use: social drinker Osteoporosis: postmenopausal estrogen deficiency and dietary calcium and/or vitamin D deficiency, History of fracture in the past year: no Fall risk: High Risk Hearing: impaired Visual acuity: normal,  does perform annual eye exam Diet: in general, an "unhealthy" diet Physical activity: Current Exercise Habits:: The patient does not participate in regular exercise at present;Exercise is limited by, Limited by:: orthopedic condition(s);respiratory conditions(s) Cardiac risk factors: Cardiac Risk Factors include: advanced age (>71men, >21  women);diabetes mellitus;dyslipidemia;family history of premature cardiovascular disease;hypertension;obesity (BMI >30kg/m2);microalbuminuria;sedentary lifestyle Depression/mood screen:   Depression screen [facility] 2/9 02/14/2015  Decreased Interest 0  Down, Depressed, Hopeless 0  PHQ - 2 Score 0  Altered sleeping -  Tired, decreased energy -  Change in appetite -  Feeling bad or failure about yourself  -  Trouble concentrating -  Moving slowly or fidgety/restless -  Suicidal thoughts -  PHQ-9 Score -    ADLs:  In your present state of health, do you have any difficulty performing the following activities: 02/14/2015 09/05/2014  Hearing? Y N  Vision? N N  Difficulty concentrating or making decisions? Y N  Walking or climbing stairs? Y N  Dressing or bathing? N N  Doing errands, shopping? N N  Preparing Food and eating ? N -  Using the Toilet? Y -  In the past six months, have you accidently leaked urine? Y -  Do you have problems with loss of bowel control? N -  Managing your Medications? N -  Managing your Finances? N -  Housekeeping or managing your Housekeeping? Y -     Cognitive Testing  Alert? Yes  Normal Appearance?Yes  Oriented to person? Yes  Place? Yes   Time? Yes  Recall of three objects?  No  Can perform simple calculations? No  Displays appropriate judgment?Yes  Can read the correct time from a watch face?Yes  EOL planning: Does patient have an advance directive?: No Would patient like information on creating an advanced directive?: No - patient declined information   Objective:   Blood pressure 138/86, pulse 64, temperature 98.2 F (36.8 C), temperature source Temporal, resp. rate 18, height 5' 3.5" (1.613 m), weight 230 lb (104.327 kg). Body mass index is 40.1 kg/(m^2).  General appearance: alert, no distress, WD/WN,  female HEENT: normocephalic, sclerae anicteric, TMs pearly, nares patent, no discharge or erythema, pharynx normal Oral cavity: MMM, no  lesions Neck: supple, no lymphadenopathy, no thyromegaly, no masses Heart: RRR, normal S1, S2, no murmurs Lungs: CTA bilaterally, no wheezes, rhonchi, or rales Abdomen: +bs, soft, obese non tender, non distended, no masses, no  hepatomegaly, no splenomegaly Musculoskeletal: diffuse tenderness, pin point tenderness at ischial bursa.  Extremities: 2+ edema, no cyanosis, no clubbing Pulses: 2+ symmetric, upper and lower extremities, normal cap refill Neurological: alert, oriented x 3, CN2-12 intact, strength normal upper extremities and lower extremities, sensation normal throughout, DTRs 2+ throughout, no cerebellar signs,walks with walker, unsteady Psychiatric: normal affect, behavior normal, pleasant  Breast: defer Gyn: defer Rectal: defer  Medicare Attestation I have personally reviewed: The patient's medical and social history Their use of alcohol, tobacco or illicit drugs Their current medications and supplements The patient's functional ability including ADLs,fall risks, home safety risks, cognitive, and hearing and visual impairment Diet and physical activities Evidence for depression or mood disorders  The patient's weight, height, BMI, and visual acuity have been recorded in the chart.  I have made referrals, counseling, and provided education to the patient based on review of the above and I have provided the patient with a written personalized care plan for preventive services.     Vicie Mutters, PA-C   02/14/2015

## 2015-02-15 LAB — URINALYSIS, ROUTINE W REFLEX MICROSCOPIC
BILIRUBIN URINE: NEGATIVE
Glucose, UA: NEGATIVE mg/dL
Hgb urine dipstick: NEGATIVE
Ketones, ur: NEGATIVE mg/dL
NITRITE: NEGATIVE
PH: 7 (ref 5.0–8.0)
PROTEIN: 30 mg/dL — AB
Specific Gravity, Urine: 1.007 (ref 1.005–1.030)
UROBILINOGEN UA: 0.2 mg/dL (ref 0.0–1.0)

## 2015-02-15 LAB — URINALYSIS, MICROSCOPIC ONLY
Casts: NONE SEEN
Crystals: NONE SEEN
Squamous Epithelial / HPF: NONE SEEN

## 2015-02-15 LAB — URINE CULTURE: COLONY COUNT: 12

## 2015-02-17 DIAGNOSIS — H25011 Cortical age-related cataract, right eye: Secondary | ICD-10-CM | POA: Diagnosis not present

## 2015-02-17 DIAGNOSIS — H25811 Combined forms of age-related cataract, right eye: Secondary | ICD-10-CM | POA: Diagnosis not present

## 2015-02-17 DIAGNOSIS — H2511 Age-related nuclear cataract, right eye: Secondary | ICD-10-CM | POA: Diagnosis not present

## 2015-02-23 ENCOUNTER — Ambulatory Visit (INDEPENDENT_AMBULATORY_CARE_PROVIDER_SITE_OTHER): Payer: Medicare Other | Admitting: Physician Assistant

## 2015-02-23 ENCOUNTER — Encounter: Payer: Self-pay | Admitting: Physician Assistant

## 2015-02-23 VITALS — BP 158/88 | HR 68 | Temp 97.3°F | Resp 16 | Ht 63.5 in | Wt 221.2 lb

## 2015-02-23 DIAGNOSIS — H16132 Photokeratitis, left eye: Secondary | ICD-10-CM

## 2015-02-23 DIAGNOSIS — L570 Actinic keratosis: Secondary | ICD-10-CM | POA: Diagnosis not present

## 2015-02-23 DIAGNOSIS — L821 Other seborrheic keratosis: Secondary | ICD-10-CM

## 2015-02-23 DIAGNOSIS — H16139 Photokeratitis, unspecified eye: Secondary | ICD-10-CM | POA: Insufficient documentation

## 2015-02-23 MED ORDER — PHENTERMINE HCL 37.5 MG PO TABS: 37.5000 mg | ORAL_TABLET | Freq: Every day | ORAL | Status: DC

## 2015-02-23 NOTE — Patient Instructions (Signed)
Phentermine  While taking the medication we may ask that you come into the office once a month or once every 2-3 months to monitor your weight, blood pressure, and heart rate. In addition we can help answer your questions about diet, exercise, and help you every step of the way with your weight loss journey. Sometime it is helpful if you bring in a food diary or use an app on your phone such as myfitnesspal to record your calorie intake, especially in the beginning.   You can start out on 1/3 to 1/2 a pill in the morning and if you are tolerating it well you can increase to one pill daily. I also have some patients that take 1/3 or 1/2 at lunch to help prevent night time eating.  This medication is cheapest CASH pay at [facility] is 14-17 dollars and you do NOT need a membership to get meds from there.    What is this medicine? PHENTERMINE (FEN ter meen) decreases your appetite. This medicine is intended to be used in addition to a healthy reduced calorie diet and exercise. The best results are achieved this way. This medicine is only indicated for short-term use. Eventually your weight loss may level out and the medication will no longer be needed.   How should I use this medicine? Take this medicine by mouth. Follow the directions on the prescription label. The tablets should stay in the bottle until immediately before you take your dose. Take your doses at regular intervals. Do not take your medicine more often than directed.  Overdosage: If you think you have taken too much of this medicine contact a poison control center or emergency room at once. NOTE: This medicine is only for you. Do not share this medicine with others.  What if I miss a dose? If you miss a dose, take it as soon as you can. If it is almost time for your next dose, take only that dose. Do not take double or extra doses. Do not increase or in any way change your dose without consulting your  doctor.  What should I watch for while using this medicine? Notify your physician immediately if you become short of breath while doing your normal activities. Do not take this medicine within 6 hours of bedtime. It can keep you from getting to sleep. Avoid drinks that contain caffeine and try to stick to a regular bedtime every night. Do not stand or sit up quickly, especially if you are an older patient. This reduces the risk of dizzy or fainting spells. Avoid alcoholic drinks.  What side effects may I notice from receiving this medicine? Side effects that you should report to your doctor or health care professional as soon as possible: -chest pain, palpitations -depression or severe changes in mood -increased blood pressure -irritability -nervousness or restlessness -severe dizziness -shortness of breath -problems urinating -unusual swelling of the legs -vomiting  Side effects that usually do not require medical attention (report to your doctor or health care professional if they continue or are bothersome): -blurred vision or other eye problems -changes in sexual ability or desire -constipation or diarrhea -difficulty sleeping -dry mouth or unpleasant taste -headache -nausea This list may not describe all possible side effects. Call your doctor for medical advice about side effects. You may report side effects to FDA at 1-800-FDA-1088.  Seborrheic Keratosis Seborrheic keratosis is a common, noncancerous (benign) skin growth that can occur anywhere on the skin.It looks like "  stuck-on," waxy, rough, tan, brown, or black spots on the skin. These skin growths can be flat or raised.They are often called "barnacles" because of their pasted-on appearance.Usually, these skin growths appear in adulthood, around age 70, and increase in number as you age. They may also develop during pregnancy or following estrogen therapy. Many people may only have one growth appear in their lifetime,  while some people may develop many growths. CAUSES It is unknown what causes these skin growths, but they appear to run in families. SYMPTOMS Seborrheic keratosis is often located on the face, chest, shoulders, back, or other areas. These growths are:  Usually painless, but may become irritated and itchy.  Yellow, brown, black, or other colors.  Slightly raised or have a flat surface.  Sometimes rough or wart-like in texture.  Often waxy on the surface.  Round or oval-shaped.  Sometimes "stuck-on" in appearance.  Sometimes single, but there are usually many growths. Any growth that bleeds, itches on a regular basis, becomes inflamed, or becomes irritated needs to be evaluated by a skin specialist (dermatologist). DIAGNOSIS Diagnosis is mainly based on the way the growths appear. In some cases, it can be difficult to tell this type of skin growth from skin cancer. A skin growth tissue sample (biopsy) may be used to confirm the diagnosis. TREATMENT Most often, treatment is not needed because the skin growths are benign.If the skin growth is irritated easily by clothing or jewelry, causing it to scab or bleed, treatment may be recommended. Patients may also choose to have the growths removed because they do not like their appearance. Most commonly, these growths are treated with cryosurgery. In cryosurgery, liquid nitrogen is applied to "freeze" the growth. The growth usually falls off within a matter of days. A blister may form and dry into a scab that will also fall off. After the growth or scab falls off, it may leave a dark or light spot on the skin. This color may fade over time, or it may remain permanent on the skin. HOME CARE INSTRUCTIONS If the skin growths are treated with cryosurgery, the treated area needs to be kept clean with water and soap. SEEK MEDICAL CARE IF:  You have questions about these growths or other skin problems.  You develop new symptoms, including:  A  change in the appearance of the skin growth.  New growths.  Any bleeding, itching, or pain in the growths.  A skin growth that looks similar to seborrheic keratosis. Document Released: 08/11/2010 Document Revised: 10/01/2011 Document Reviewed: 08/11/2010 [facility] Patient Information 2015 [place], [place]. This information is not intended to replace advice given to you by your health care provider. Make sure you discuss any questions you have with your health care provider.

## 2015-02-23 NOTE — Progress Notes (Signed)
Chief Complaint: Patient presents for evaluation of a skin lesion. She has non healing area on her right hand and she has some seb derm on her chest she would like removed. She is down 9 lbs and is doing better with her swelling, breathing, etc however she is still struggling with her weight. Has been on phentermine in the past and would like another RX  Exam: Left hand with non healing ulcer x 1 year, erythematous 5x106mm She has several seb keratosis on chest and back   Anesthesia: Lidocaine 1% without epinephrine   Procedure Details   The risks, benefits, indications, potential complications, and alternatives were explained to the patient and informed consent obtained.  Liquid nitrogen applied to seb keratosis with three freeze and thaw technique.   Left scaly erythematous patch, liquid nitrogen applied with 3 freeze and thaw technique, if it comes back we will remove.   Condition: Stable  Complications:  None  Diagnosis:  Actinic keratosis          Seb Keratosis           Obesity  Procedure code:   03159  [id number]          Plan: 1. Instructed to keep the wound dry and covered for 24-48 hours and clean thereafter. 2. Warning signs of infection were reviewed.    3. Recommended that the patient use OTC acetaminophen as needed for pain.   4. Obesity with co morbidities- long discussion about weight loss, diet, and exercise, will start the patient on phentermine- hand out given and AE's discussed, will do close follow up.   Future Appointments Date Time Provider [place]  03/31/2015 9:30 AM Unk Pinto, MD [facility] None  09/22/2015 10:00 AM Unk Pinto, MD [facility] None

## 2015-02-24 ENCOUNTER — Other Ambulatory Visit: Payer: Self-pay | Admitting: Internal Medicine

## 2015-03-01 ENCOUNTER — Other Ambulatory Visit: Payer: Self-pay | Admitting: Internal Medicine

## 2015-03-01 ENCOUNTER — Other Ambulatory Visit: Payer: Self-pay | Admitting: *Deleted

## 2015-03-01 MED ORDER — SERTRALINE HCL 50 MG PO TABS: 50.0000 mg | ORAL_TABLET | Freq: Every day | ORAL | Status: DC

## 2015-03-02 DIAGNOSIS — Z01419 Encounter for gynecological examination (general) (routine) without abnormal findings: Secondary | ICD-10-CM | POA: Diagnosis not present

## 2015-03-02 DIAGNOSIS — Z1231 Encounter for screening mammogram for malignant neoplasm of breast: Secondary | ICD-10-CM | POA: Diagnosis not present

## 2015-03-23 ENCOUNTER — Other Ambulatory Visit: Payer: Self-pay | Admitting: Internal Medicine

## 2015-03-24 ENCOUNTER — Ambulatory Visit: Payer: Self-pay | Admitting: Internal Medicine

## 2015-03-30 DIAGNOSIS — Z961 Presence of intraocular lens: Secondary | ICD-10-CM | POA: Diagnosis not present

## 2015-03-31 ENCOUNTER — Encounter: Payer: Self-pay | Admitting: Internal Medicine

## 2015-03-31 ENCOUNTER — Other Ambulatory Visit: Payer: Self-pay | Admitting: Internal Medicine

## 2015-03-31 ENCOUNTER — Ambulatory Visit (INDEPENDENT_AMBULATORY_CARE_PROVIDER_SITE_OTHER): Payer: Medicare Other | Admitting: Internal Medicine

## 2015-03-31 VITALS — BP 138/80 | HR 64 | Temp 97.8°F | Resp 16 | Ht 63.0 in | Wt 221.3 lb

## 2015-03-31 DIAGNOSIS — Z6839 Body mass index (BMI) 39.0-39.9, adult: Secondary | ICD-10-CM | POA: Diagnosis not present

## 2015-03-31 DIAGNOSIS — M1000 Idiopathic gout, unspecified site: Secondary | ICD-10-CM

## 2015-03-31 DIAGNOSIS — I10 Essential (primary) hypertension: Secondary | ICD-10-CM

## 2015-03-31 DIAGNOSIS — E559 Vitamin D deficiency, unspecified: Secondary | ICD-10-CM | POA: Diagnosis not present

## 2015-03-31 DIAGNOSIS — E785 Hyperlipidemia, unspecified: Secondary | ICD-10-CM

## 2015-03-31 DIAGNOSIS — M109 Gout, unspecified: Secondary | ICD-10-CM | POA: Diagnosis not present

## 2015-03-31 DIAGNOSIS — E119 Type 2 diabetes mellitus without complications: Secondary | ICD-10-CM | POA: Diagnosis not present

## 2015-03-31 DIAGNOSIS — Z79899 Other long term (current) drug therapy: Secondary | ICD-10-CM

## 2015-03-31 DIAGNOSIS — E1129 Type 2 diabetes mellitus with other diabetic kidney complication: Secondary | ICD-10-CM

## 2015-03-31 LAB — HEPATIC FUNCTION PANEL
ALK PHOS: 54 U/L (ref 33–130)
ALT: 13 U/L (ref 6–29)
AST: 20 U/L (ref 10–35)
Albumin: 4.3 g/dL (ref 3.6–5.1)
BILIRUBIN DIRECT: 0.1 mg/dL (ref <=0.2)
BILIRUBIN INDIRECT: 0.6 mg/dL (ref 0.2–1.2)
Total Bilirubin: 0.7 mg/dL (ref 0.2–1.2)
Total Protein: 7.1 g/dL (ref 6.1–8.1)

## 2015-03-31 LAB — CBC WITH DIFFERENTIAL/PLATELET
BASOS PCT: 1 % (ref 0–1)
Basophils Absolute: 0.1 10*3/uL (ref 0.0–0.1)
Eosinophils Absolute: 0.2 10*3/uL (ref 0.0–0.7)
Eosinophils Relative: 3 % (ref 0–5)
HCT: 40.3 % (ref 36.0–46.0)
HEMOGLOBIN: 13.3 g/dL (ref 12.0–15.0)
LYMPHS PCT: 30 % (ref 12–46)
Lymphs Abs: 1.6 10*3/uL (ref 0.7–4.0)
MCH: 28.6 pg (ref 26.0–34.0)
MCHC: 33 g/dL (ref 30.0–36.0)
MCV: 86.7 fL (ref 78.0–100.0)
MONOS PCT: 3 % (ref 3–12)
MPV: 9.9 fL (ref 8.6–12.4)
Monocytes Absolute: 0.2 10*3/uL (ref 0.1–1.0)
NEUTROS ABS: 3.3 10*3/uL (ref 1.7–7.7)
NEUTROS PCT: 63 % (ref 43–77)
Platelets: 213 10*3/uL (ref 150–400)
RBC: 4.65 MIL/uL (ref 3.87–5.11)
RDW: 15 % (ref 11.5–15.5)
WBC: 5.2 10*3/uL (ref 4.0–10.5)

## 2015-03-31 LAB — BASIC METABOLIC PANEL WITH GFR
BUN: 31 mg/dL — ABNORMAL HIGH (ref 7–25)
CHLORIDE: 96 mmol/L — AB (ref 98–110)
CO2: 33 mmol/L — AB (ref 20–31)
Calcium: 10.1 mg/dL (ref 8.6–10.4)
Creat: 1.39 mg/dL — ABNORMAL HIGH (ref 0.60–0.93)
GFR, EST NON AFRICAN AMERICAN: 38 mL/min — AB (ref >=60)
GFR, Est African American: 44 mL/min — ABNORMAL LOW (ref >=60)
Glucose, Bld: 183 mg/dL — ABNORMAL HIGH (ref 65–99)
Potassium: 3.7 mmol/L (ref 3.5–5.3)
SODIUM: 141 mmol/L (ref 135–146)

## 2015-03-31 LAB — MAGNESIUM: Magnesium: 2.1 mg/dL (ref 1.5–2.5)

## 2015-03-31 LAB — URIC ACID: URIC ACID, SERUM: 8.5 mg/dL — AB (ref 2.4–7.0)

## 2015-03-31 LAB — TSH: TSH: 1.981 u[IU]/mL (ref 0.350–4.500)

## 2015-03-31 LAB — LIPID PANEL
CHOL/HDL RATIO: 3.3 ratio (ref <=5.0)
Cholesterol: 206 mg/dL — ABNORMAL HIGH (ref 125–200)
HDL: 62 mg/dL (ref >=46)
LDL Cholesterol: 116 mg/dL (ref <130)
Triglycerides: 138 mg/dL (ref <150)
VLDL: 28 mg/dL (ref <30)

## 2015-03-31 NOTE — Progress Notes (Signed)
Patient ID: Alyssa [name], female   DOB: 11/21/1945, 69 y.o.   MRN: [id number]   This very nice 69 y.o. 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 Today patient reports that she has had an exacerbation of her LBP and peripheral joint pains since she tried tapering her Gabapentin dose. She also relates she's been off of her Atorvastatin x 3-4 days for concern of possible med effect.    Patient is treated for HTN since 1988 & BP has been controlled at home. Today's BP: 138/80 mmHg. Patient has had no complaints of any cardiac type chest pain, palpitations, dyspnea/orthopnea/PND, dizziness, claudication, or dependent edema.   Hyperlipidemia is controlled with diet & meds. Patient denies myalgias or other med SE's. Last Lipids were at goal - Cholesterol 157; HDL 45*; LDL 82; Triglycerides 150* on 12/15/2014.   Also, the patient has history of T2_NIDDM circa 1990 and has had no symptoms of reactive hypoglycemia, diabetic polys, paresthesias or visual blurring. She alleges CBG's are mostly less than 120.  Last A1c was  8.7 % on 12/15/2014.    Patient has Hypothyroidism and has maintained euthyroid status. Further, the patient also has history of Vitamin D Deficiency and supplements vitamin D without any suspected side-effects. Last vitamin D was 60 on 12/15/2014.    Medication Sig  . aspirin 81 MG tablet Take 81 mg by mouth daily.  Marland Kitchen atorvastatin (LIPITOR) 80 MG tablet Take 1 tablet (80 mg total) by mouth daily.  Marland Kitchen VITAMIN D Take 5,000 Units by mouth every evening.   Marland Kitchen VITAMIN B 12 Take 1,000 mcg by mouth every other day.  . furosemide  40 MG tablet TAKE 1 TABLET BY MOUTH TWICE DAILY  . gabapentin  800 MG tablet Take 1 tablet (800 mg total) by mouth 2 (two) times daily.  Marland Kitchen glipiZIDE (GLUCOTROL) 10 MG tablet Take 5-10 mg by mouth 2 (two) times daily as needed (only takes if needed, depending on glucose level >200).   Marland Kitchen  latanoprost (XALATAN) 0.005 % ophthalmic solution Place 1 drop into both eyes every evening.   Marland Kitchen levothyroxine  50 MCG tablet TAKE 1 TABLET BY MOUTH DAILY BEFORE BREKFAST  . losartan (COZAAR) 100 MG tablet TAKE 1 TABLET BY MOUTH DAILY  . MAGNESIUM-OXIDE 400 (241.3 MG)  TAKE 1 TABLET BY MOUTH TWICE DAILY  . metFORMIN -XR 500 MG  TAKE 2 TABLETS BY MOUTH TWICE DAILY FOR DIABETES  . minoxidil (LONITEN) 10 MG tablet TAKE 1/2 TO 1 TABLET BY MOUTH DAILY AS DIRECTED FOR BLOOD PRESSURE  . Multiple Vitamin  Take 1 tablet by mouth daily.  Marland Kitchen FISH OIL 1000 MG CAPS Take 1,000 mg by mouth every evening.   . phentermine (ADIPEX-P) 37.5 MG tablet Take 1 tablet (37.5 mg total) by mouth daily before breakfast.  . Probiotic   Take 1 tablet by mouth every morning.   . sertraline (ZOLOFT) 50 MG tablet Take 1 tablet (50 mg total) by mouth daily.  Marland Kitchen TIMOPTIC 0.5 % ophth soln Place 1 drop into both eyes 2 (two) times daily.   . traZODone  150 MG tablet TAKE 1/3 TO 1/2 TO 1 TABLET BY MOUTH EVERY NIGHT AT BEDTIME AS NEEDED FOR SLEEP  . glipiZIDE  10 MG tablet TAKE 1 TABLET BY MOUTH THREE TIMES DAILY  . losartan 100 MG tablet TAKE 1 TABLET BY MOUTH DAILY  . minoxidil  10 MG tablet TAKE 1/2 TO 1  TABLET BY MOUTH DAILY AS DIRECTED FOR BLOOD PRESSURE    Allergies  Allergen Reactions  . Ciprofloxacin Hcl Diarrhea  . Cymbalta [Duloxetine Hcl] Other (See Comments)    Per pt: it did not work  . Codeine Other (See Comments)    Per pt: unknown  . Lortab [Hydrocodone-Acetaminophen] Other (See Comments)    Per pt: unknown  . Oxycodone Other (See Comments)    Per pt: unknown   PMHx:   Past Medical History  Diagnosis Date  . Osteopenia   . Osteoporosis   . Diabetes mellitus without complication   . Hypertension   . Bipolar 1 disorder   . Hyperlipidemia   . Anemia   . Cataract   . Glaucoma   . Chronic back pain   . Hypothyroidism    Immunization History  Administered Date(s) Administered  . DT 05/18/2014  .  Influenza Split 04/22/2013  . Influenza, High Dose Seasonal PF 04/01/2014  . Pneumococcal Conjugate-13 09/02/2014  . Pneumococcal Polysaccharide-23 07/23/2001, 07/23/2010  . Td 07/23/2002  . Zoster 07/23/2006   Past Surgical History  Procedure Laterality Date  . Back surgery  2007  . Knee arthroscopy Bilateral   . Dilation and curettage of uterus    . Breast surgery Left     CYST REMOVAL  . Lumbar fusion  2009   FHx:    Reviewed / unchanged  SHx:    Reviewed / unchanged  Systems Review:  Constitutional: Denies fever, chills, wt changes, headaches, insomnia, fatigue, night sweats, change in appetite. Eyes: Denies redness, blurred vision, diplopia, discharge, itchy, watery eyes.  ENT: Denies discharge, congestion, post nasal drip, epistaxis, sore throat, earache, hearing loss, dental pain, tinnitus, vertigo, sinus pain, snoring.  CV: Denies chest pain, palpitations, irregular heartbeat, syncope, dyspnea, diaphoresis, orthopnea, PND, claudication or edema. Respiratory: denies cough, dyspnea, DOE, pleurisy, hoarseness, laryngitis, wheezing.  Gastrointestinal: Denies dysphagia, odynophagia, heartburn, reflux, water brash, abdominal pain or cramps, nausea, vomiting, bloating, diarrhea, constipation, hematemesis, melena, hematochezia  or hemorrhoids. Genitourinary: Denies dysuria, frequency, urgency, nocturia, hesitancy, discharge, hematuria or flank pain. Musculoskeletal: Denies arthralgias, myalgias, stiffness, jt. swelling, pain, limping or strain/sprain.  Skin: Denies pruritus, rash, hives, warts, acne, eczema or change in skin lesion(s). Neuro: No weakness, tremor, incoordination, spasms, paresthesia or pain. Psychiatric: Denies confusion, memory loss or sensory loss. Endo: Denies change in weight, skin or hair change.  Heme/Lymph: No excessive bleeding, bruising or enlarged lymph nodes.  Physical Exam  BP 138/80   Pulse 64  Temp 97.8 F   Resp 16  Ht 5\' 3"    Wt 221 lb 4.8 oz       BMI 39.21  Appears well nourished and in no distress. Eyes: PERRLA, EOMs, conjunctiva no swelling or erythema. Sinuses: No frontal/maxillary tenderness ENT/Mouth: EAC's clear, TM's nl w/o erythema, bulging. Nares clear w/o erythema, swelling, exudates. Oropharynx clear without erythema or exudates. Oral hygiene is good. Tongue normal, non obstructing. Hearing intact.  Neck: Supple. Thyroid nl. Car 2+/2+ without bruits, nodes or JVD. Chest: Respirations nl with BS clear & equal w/o rales, rhonchi, wheezing or stridor.  Cor: Heart sounds normal w/ regular rate and rhythm without sig. murmurs, gallops, clicks, or rubs. Peripheral pulses normal and equal  without edema.  Abdomen: Soft & bowel sounds normal. Non-tender w/o guarding, rebound, hernias, masses, or organomegaly.  Lymphatics: Unremarkable.  Musculoskeletal: Full ROM all peripheral extremities, joint stability, 5/5 strength, and normal gait.  Skin: Warm, dry without exposed rashes, lesions or ecchymosis apparent.  Neuro:  Cranial nerves intact, reflexes equal bilaterally. Sensory-motor testing grossly intact. Tendon reflexes grossly intact.  Pysch: Alert & oriented x 3.  Insight and judgement nl & appropriate. No ideations.  Assessment and Plan:  1. Essential hypertension  - TSH  2. T2_NIDDM  - Hemoglobin A1c - Insulin, random  3. Hyperlipidemia  - Lipid panel  - advised to resume her Atorvastatin.   4. Vitamin D deficiency  - Vit D  25 hydroxy   5. T2_NIDDM w/ Stage 3 CKD   6. Idiopathic gout  - Uric acid  7. Encounter for long-term (current) use of medications  - CBC with Differential/Platelet - BASIC METABOLIC PANEL WITH GFR - Hepatic function panel - Magnesium  8. BMI 39.0-39.9  9. LBP and polyarthralgias  - advised that she resume her prior dose of Gabapentin.   Recommended regular exercise, BP monitoring, weight control, and discussed med and SE's. Recommended labs to assess and monitor  clinical status. Further disposition pending results of labs. Over 30 minutes of exam, counseling, chart review was performed

## 2015-03-31 NOTE — Patient Instructions (Signed)

## 2015-04-01 LAB — INSULIN, RANDOM: Insulin: 5.8 u[IU]/mL (ref 2.0–19.6)

## 2015-04-01 LAB — HEMOGLOBIN A1C
HEMOGLOBIN A1C: 8.4 % — AB (ref <5.7)
Mean Plasma Glucose: 194 mg/dL — ABNORMAL HIGH (ref <117)

## 2015-04-01 LAB — VITAMIN D 25 HYDROXY (VIT D DEFICIENCY, FRACTURES): Vit D, 25-Hydroxy: 46 ng/mL (ref 30–100)

## 2015-04-08 ENCOUNTER — Ambulatory Visit (INDEPENDENT_AMBULATORY_CARE_PROVIDER_SITE_OTHER): Payer: Medicare Other | Admitting: Physician Assistant

## 2015-04-08 ENCOUNTER — Other Ambulatory Visit: Payer: Self-pay | Admitting: Internal Medicine

## 2015-04-08 ENCOUNTER — Encounter: Payer: Self-pay | Admitting: Physician Assistant

## 2015-04-08 VITALS — BP 132/70 | HR 68 | Temp 97.3°F | Resp 16 | Ht 63.5 in | Wt 228.4 lb

## 2015-04-08 DIAGNOSIS — R251 Tremor, unspecified: Secondary | ICD-10-CM

## 2015-04-08 DIAGNOSIS — I5031 Acute diastolic (congestive) heart failure: Secondary | ICD-10-CM | POA: Diagnosis not present

## 2015-04-08 DIAGNOSIS — N183 Chronic kidney disease, stage 3 unspecified: Secondary | ICD-10-CM

## 2015-04-08 DIAGNOSIS — I10 Essential (primary) hypertension: Secondary | ICD-10-CM

## 2015-04-08 DIAGNOSIS — Z23 Encounter for immunization: Secondary | ICD-10-CM | POA: Diagnosis not present

## 2015-04-08 LAB — CBC WITH DIFFERENTIAL/PLATELET
BASOS PCT: 0 % (ref 0–1)
Basophils Absolute: 0 10*3/uL (ref 0.0–0.1)
EOS PCT: 2 % (ref 0–5)
Eosinophils Absolute: 0.1 10*3/uL (ref 0.0–0.7)
HCT: 35.2 % — ABNORMAL LOW (ref 36.0–46.0)
HEMOGLOBIN: 11.9 g/dL — AB (ref 12.0–15.0)
Lymphocytes Relative: 29 % (ref 12–46)
Lymphs Abs: 1.5 10*3/uL (ref 0.7–4.0)
MCH: 28.8 pg (ref 26.0–34.0)
MCHC: 33.8 g/dL (ref 30.0–36.0)
MCV: 85.2 fL (ref 78.0–100.0)
MONO ABS: 0.3 10*3/uL (ref 0.1–1.0)
MONOS PCT: 5 % (ref 3–12)
MPV: 10.2 fL (ref 8.6–12.4)
NEUTROS ABS: 3.2 10*3/uL (ref 1.7–7.7)
Neutrophils Relative %: 64 % (ref 43–77)
Platelets: 240 10*3/uL (ref 150–400)
RBC: 4.13 MIL/uL (ref 3.87–5.11)
RDW: 15.1 % (ref 11.5–15.5)
WBC: 5 10*3/uL (ref 4.0–10.5)

## 2015-04-08 LAB — BASIC METABOLIC PANEL WITH GFR
BUN: 40 mg/dL — ABNORMAL HIGH (ref 7–25)
CALCIUM: 9.9 mg/dL (ref 8.6–10.4)
CO2: 32 mmol/L — AB (ref 20–31)
CREATININE: 1.85 mg/dL — AB (ref 0.60–0.93)
Chloride: 98 mmol/L (ref 98–110)
GFR, Est African American: 31 mL/min — ABNORMAL LOW (ref >=60)
GFR, Est Non African American: 27 mL/min — ABNORMAL LOW (ref >=60)
GLUCOSE: 168 mg/dL — AB (ref 65–99)
Potassium: 4.6 mmol/L (ref 3.5–5.3)
SODIUM: 142 mmol/L (ref 135–146)

## 2015-04-08 NOTE — Patient Instructions (Addendum)
Do only 3 tablets of the gabapentin a day- going up to 4 can cause tremors/shaking  If this does not help the tremors after 1 week then go to 1/2 of the trazodone at night.   Do only 3 of the metformin a day  Do the following things EVERYDAY: 1) Weigh yourself in the morning before breakfast. Write it down and keep it in a log. 2) Take your medicines as prescribed 3) Eat low salt foods-Limit salt (sodium) to 2000 mg per day. Best thing to do is avoid processed foods.   4) Stay as active as you can everyday 5) Limit all fluids for the day to less than 2 liters  Call your doctor if:  Anytime you have any of the following symptoms:  1) 3 pound weight gain in 24 hours or 5 pounds in 1 week  THEN INCREASE LASIX TO 1.5 pills in the morning and the one in the evening for 3 days.  2) shortness of breath, with or without a dry hacking cough  3) swelling in the hands, feet or stomach  4) if you have to sleep on extra pillows at night in order to breathe. 5) after laying down at night for 20-30 mins, you wake up short of breath.   These can all be signs of fluid overload.

## 2015-04-08 NOTE — Progress Notes (Signed)
Subjective:    Patient ID: Alyssa [name], female    DOB: Aug 30, 1945, 69 y.o.   MRN: [id number]  HPI 69 y.o. obese WF with history of diastolic CHF, HTN, CKD stage III, bipolar, COPD presents with tremor/shaking. She states last visit she was told to take zyrtec. Her metformin was increased from 2 tablets to 4 tablets daily. She was also given imodium 2-3 before breakfast for diarrhea, which is helping. In addition she has increased her gabapentin from 2 tablets to 4 tablets. She states the pain is better but she is having shaking/tremors. She does have a history of AMS and tremors with increase in medications thought to be due to CKD. She has had  increase in her weight of 7 lbs but denies SOB, PND, orthopnea, mild edema. She is till on 2 pills a day.    Wt Readings from Last 3 Encounters:  04/08/15 228 lb 6.4 oz (103.602 kg)  03/31/15 221 lb 4.8 oz (100.381 kg)  02/23/15 221 lb 3.2 oz (100.336 kg)    Blood pressure 132/70, pulse 68, temperature 97.3 F (36.3 C), resp. rate 16, height 5' 3.5" (1.613 m), weight 228 lb 6.4 oz (103.602 kg). Current Outpatient Prescriptions on File Prior to Visit  Medication Sig Dispense Refill  . ACCU-CHEK AVIVA PLUS test strip     . aspirin 81 MG tablet Take 81 mg by mouth daily.    Marland Kitchen atorvastatin (LIPITOR) 80 MG tablet Take 1 tablet (80 mg total) by mouth daily. 30 tablet 1  . Blood Glucose Monitoring Suppl (ACCU-CHEK AVIVA PLUS) W/DEVICE KIT   0  . Cholecalciferol (VITAMIN D-3 PO) Take 5,000 Units by mouth every evening.     . Cyanocobalamin (VITAMIN B 12 PO) Take 1,000 mcg by mouth every other day.    . DUREZOL 0.05 % EMUL INT 1 GTT INTO  OD QID  0  . furosemide (LASIX) 40 MG tablet TAKE 1 TABLET BY MOUTH TWICE DAILY 180 tablet 1  . gabapentin (NEURONTIN) 800 MG tablet Take 1 tablet (800 mg total) by mouth 2 (two) times daily. 60 tablet 5  . glipiZIDE (GLUCOTROL) 10 MG tablet Take 5-10 mg by mouth 2 (two) times daily as needed (only takes if needed,  depending on glucose level >200).     Marland Kitchen latanoprost (XALATAN) 0.005 % ophthalmic solution Place 1 drop into both eyes every evening.     Marland Kitchen levothyroxine (SYNTHROID, LEVOTHROID) 50 MCG tablet TAKE 1 TABLET BY MOUTH DAILY BEFORE BREKFAST 90 tablet 1  . losartan (COZAAR) 100 MG tablet TAKE 1 TABLET BY MOUTH DAILY 90 tablet 2  . MAGNESIUM-OXIDE 400 (241.3 MG) MG tablet TAKE 1 TABLET BY MOUTH TWICE DAILY 180 tablet 0  . metFORMIN (GLUCOPHAGE-XR) 500 MG 24 hr tablet TAKE 2 TABLETS BY MOUTH TWICE DAILY FOR DIABETES 360 tablet 2  . minoxidil (LONITEN) 10 MG tablet TAKE 1/2 TO 1 TABLET BY MOUTH DAILY AS DIRECTED FOR BLOOD PRESSURE 90 tablet 1  . Multiple Vitamin (MULTIVITAMIN) tablet Take 1 tablet by mouth daily.    . Omega-3 Fatty Acids (FISH OIL) 1000 MG CAPS Take 1,000 mg by mouth every evening.     . phentermine (ADIPEX-P) 37.5 MG tablet Take 1 tablet (37.5 mg total) by mouth daily before breakfast. 30 tablet 0  . Probiotic Product (PROBIOTIC DAILY PO) Take 1 tablet by mouth every morning.     . sertraline (ZOLOFT) 50 MG tablet Take 1 tablet (50 mg total) by mouth daily. 90 tablet  1  . timolol (TIMOPTIC) 0.5 % ophthalmic solution Place 1 drop into both eyes 2 (two) times daily.     . traZODone (DESYREL) 150 MG tablet TAKE 1/3 TO 1/2 TO 1 TABLET BY MOUTH EVERY NIGHT AT BEDTIME AS NEEDED FOR SLEEP 90 tablet 1  . VIGAMOX 0.5 % ophthalmic solution INSTILL 1 DROP INTO RIGHT EYE QID STARTING 2 DAYS PRIOR TO SURGERY AND INSTILL 2 DROPS THE MORNING OF SURGERY  0   No current facility-administered medications on file prior to visit.   Past Medical History  Diagnosis Date  . Osteoporosis   . Diabetes mellitus without complication   . Hypertension   . Bipolar 1 disorder   . Hyperlipidemia   . Anemia   . Cataract   . Glaucoma   . Chronic back pain   . Hypothyroidism    Review of Systems  Constitutional: Negative for fever, chills and diaphoresis.  HENT: Negative.   Respiratory: Negative.    Cardiovascular: Positive for leg swelling. Negative for chest pain and palpitations.  Gastrointestinal: Negative.   Genitourinary: Negative.   Musculoskeletal: Positive for myalgias. Negative for back pain and neck pain.  Skin: Negative.   Neurological: Positive for tremors. Negative for weakness.  Psychiatric/Behavioral: Negative.        Objective:   Physical Exam  Constitutional: She is oriented to person, place, and time. She appears well-developed and well-nourished. She does not have a sickly appearance. No distress.  HENT:  Nose: Left sinus exhibits frontal sinus tenderness.  Mouth/Throat: Uvula is midline and oropharynx is clear and moist. No oropharyngeal exudate.  Neck: Trachea normal, normal range of motion and phonation normal. Neck supple. No tracheal tenderness present. No tracheal deviation present.  Cardiovascular: Normal rate, regular rhythm and normal heart sounds.   No murmur heard. Pulmonary/Chest: Effort normal. No stridor. No respiratory distress. She has no decreased breath sounds. She has no wheezes. She has no rhonchi. She has no rales. She exhibits no tenderness.  02 RA 98%  Abdominal: Soft. Bowel sounds are normal. There is no tenderness. There is no rebound and no guarding.  Obese abdomen  Neurological: She is alert and oriented to person, place, and time. She has normal strength.  Gait is antalgic.   Skin: Skin is warm and dry. No rash noted. She is not diaphoretic.  Psychiatric: She has a normal mood and affect.  Vitals reviewed.     Assessment & Plan:  Tremors/shaking- likely from serotonin syndrome/CKD- will cut back gabapentin to 3 a day, may need to cut back on trazodone but will continue 1 a day for now. But may also have to cut back- check BMP Diastolic with weight gain but lungs CTAB, continue lasix BID but cut back on fluids, monitor weight.

## 2015-05-05 DIAGNOSIS — M25561 Pain in right knee: Secondary | ICD-10-CM | POA: Diagnosis not present

## 2015-05-16 ENCOUNTER — Other Ambulatory Visit: Payer: Self-pay | Admitting: Internal Medicine

## 2015-05-19 ENCOUNTER — Other Ambulatory Visit: Payer: Self-pay

## 2015-05-19 MED ORDER — GABAPENTIN 800 MG PO TABS: 800.0000 mg | ORAL_TABLET | Freq: Three times a day (TID) | ORAL | Status: DC

## 2015-05-24 DIAGNOSIS — M170 Bilateral primary osteoarthritis of knee: Secondary | ICD-10-CM | POA: Diagnosis not present

## 2015-06-06 DIAGNOSIS — M170 Bilateral primary osteoarthritis of knee: Secondary | ICD-10-CM | POA: Diagnosis not present

## 2015-06-07 ENCOUNTER — Other Ambulatory Visit: Payer: Self-pay | Admitting: Internal Medicine

## 2015-06-13 DIAGNOSIS — M1711 Unilateral primary osteoarthritis, right knee: Secondary | ICD-10-CM | POA: Diagnosis not present

## 2015-06-18 ENCOUNTER — Other Ambulatory Visit: Payer: Self-pay | Admitting: Internal Medicine

## 2015-06-22 DIAGNOSIS — H401122 Primary open-angle glaucoma, left eye, moderate stage: Secondary | ICD-10-CM | POA: Diagnosis not present

## 2015-06-22 DIAGNOSIS — H26493 Other secondary cataract, bilateral: Secondary | ICD-10-CM | POA: Diagnosis not present

## 2015-06-22 DIAGNOSIS — H401111 Primary open-angle glaucoma, right eye, mild stage: Secondary | ICD-10-CM | POA: Diagnosis not present

## 2015-06-22 DIAGNOSIS — E113293 Type 2 diabetes mellitus with mild nonproliferative diabetic retinopathy without macular edema, bilateral: Secondary | ICD-10-CM | POA: Diagnosis not present

## 2015-06-30 DIAGNOSIS — H26491 Other secondary cataract, right eye: Secondary | ICD-10-CM | POA: Diagnosis not present

## 2015-07-07 DIAGNOSIS — N183 Chronic kidney disease, stage 3 (moderate): Secondary | ICD-10-CM | POA: Diagnosis not present

## 2015-07-07 DIAGNOSIS — H26492 Other secondary cataract, left eye: Secondary | ICD-10-CM | POA: Diagnosis not present

## 2015-07-25 ENCOUNTER — Other Ambulatory Visit: Payer: Self-pay | Admitting: Physician Assistant

## 2015-07-27 ENCOUNTER — Ambulatory Visit: Payer: Self-pay | Admitting: Physician Assistant

## 2015-08-02 ENCOUNTER — Ambulatory Visit: Payer: Self-pay | Admitting: Physician Assistant

## 2015-08-04 ENCOUNTER — Ambulatory Visit: Payer: Self-pay | Admitting: Physician Assistant

## 2015-08-05 ENCOUNTER — Ambulatory Visit (INDEPENDENT_AMBULATORY_CARE_PROVIDER_SITE_OTHER): Payer: Medicare Other | Admitting: Physician Assistant

## 2015-08-05 ENCOUNTER — Encounter: Payer: Self-pay | Admitting: Physician Assistant

## 2015-08-05 VITALS — BP 138/72 | HR 84 | Temp 99.0°F | Resp 16 | Ht 63.5 in | Wt 233.0 lb

## 2015-08-05 DIAGNOSIS — N183 Chronic kidney disease, stage 3 unspecified: Secondary | ICD-10-CM

## 2015-08-05 DIAGNOSIS — E1129 Type 2 diabetes mellitus with other diabetic kidney complication: Secondary | ICD-10-CM | POA: Diagnosis not present

## 2015-08-05 DIAGNOSIS — R251 Tremor, unspecified: Secondary | ICD-10-CM | POA: Diagnosis not present

## 2015-08-05 DIAGNOSIS — I10 Essential (primary) hypertension: Secondary | ICD-10-CM | POA: Diagnosis not present

## 2015-08-05 DIAGNOSIS — R350 Frequency of micturition: Secondary | ICD-10-CM | POA: Diagnosis not present

## 2015-08-05 DIAGNOSIS — E785 Hyperlipidemia, unspecified: Secondary | ICD-10-CM | POA: Diagnosis not present

## 2015-08-05 DIAGNOSIS — E039 Hypothyroidism, unspecified: Secondary | ICD-10-CM | POA: Diagnosis not present

## 2015-08-05 DIAGNOSIS — Z79899 Other long term (current) drug therapy: Secondary | ICD-10-CM | POA: Diagnosis not present

## 2015-08-05 LAB — BASIC METABOLIC PANEL WITH GFR
BUN: 26 mg/dL — AB (ref 7–25)
CHLORIDE: 100 mmol/L (ref 98–110)
CO2: 35 mmol/L — AB (ref 20–31)
CREATININE: 1.53 mg/dL — AB (ref 0.60–0.93)
Calcium: 10 mg/dL (ref 8.6–10.4)
GFR, Est African American: 39 mL/min — ABNORMAL LOW (ref >=60)
GFR, Est Non African American: 34 mL/min — ABNORMAL LOW (ref >=60)
Glucose, Bld: 56 mg/dL — ABNORMAL LOW (ref 65–99)
POTASSIUM: 4.1 mmol/L (ref 3.5–5.3)
Sodium: 144 mmol/L (ref 135–146)

## 2015-08-05 LAB — CBC WITH DIFFERENTIAL/PLATELET
BASOS PCT: 1 % (ref 0–1)
Basophils Absolute: 0.1 10*3/uL (ref 0.0–0.1)
EOS ABS: 0.2 10*3/uL (ref 0.0–0.7)
Eosinophils Relative: 3 % (ref 0–5)
HCT: 35.8 % — ABNORMAL LOW (ref 36.0–46.0)
HEMOGLOBIN: 11.9 g/dL — AB (ref 12.0–15.0)
Lymphocytes Relative: 35 % (ref 12–46)
Lymphs Abs: 2.2 10*3/uL (ref 0.7–4.0)
MCH: 30.1 pg (ref 26.0–34.0)
MCHC: 33.2 g/dL (ref 30.0–36.0)
MCV: 90.4 fL (ref 78.0–100.0)
MONO ABS: 0.2 10*3/uL (ref 0.1–1.0)
MONOS PCT: 4 % (ref 3–12)
MPV: 10.5 fL (ref 8.6–12.4)
NEUTROS ABS: 3.5 10*3/uL (ref 1.7–7.7)
NEUTROS PCT: 57 % (ref 43–77)
PLATELETS: 252 10*3/uL (ref 150–400)
RBC: 3.96 MIL/uL (ref 3.87–5.11)
RDW: 14.9 % (ref 11.5–15.5)
WBC: 6.2 10*3/uL (ref 4.0–10.5)

## 2015-08-05 LAB — HEPATIC FUNCTION PANEL
ALBUMIN: 3.9 g/dL (ref 3.6–5.1)
ALT: 13 U/L (ref 6–29)
AST: 18 U/L (ref 10–35)
Alkaline Phosphatase: 46 U/L (ref 33–130)
BILIRUBIN DIRECT: 0.1 mg/dL (ref <=0.2)
BILIRUBIN TOTAL: 0.4 mg/dL (ref 0.2–1.2)
Indirect Bilirubin: 0.3 mg/dL (ref 0.2–1.2)
Total Protein: 6.3 g/dL (ref 6.1–8.1)

## 2015-08-05 LAB — HEMOGLOBIN A1C
HEMOGLOBIN A1C: 7.7 % — AB (ref <5.7)
MEAN PLASMA GLUCOSE: 174 mg/dL — AB (ref <117)

## 2015-08-05 LAB — LIPID PANEL
CHOL/HDL RATIO: 2.3 ratio (ref <=5.0)
Cholesterol: 130 mg/dL (ref 125–200)
HDL: 57 mg/dL (ref >=46)
LDL CALC: 57 mg/dL (ref <130)
Triglycerides: 78 mg/dL (ref <150)
VLDL: 16 mg/dL (ref <30)

## 2015-08-05 LAB — MAGNESIUM: Magnesium: 1.9 mg/dL (ref 1.5–2.5)

## 2015-08-05 NOTE — Patient Instructions (Addendum)
ONLY TAKE 2 OF THE GABAPENTIN A DAY STOP THE TRAZODONE CAN TAKE XANAX 1MG  1/2-1 AT NIGHT FOR SLEEP INSTEAD  WILL CHECK LABS CHECK SUGARS  GO TO THE ER IF YOU HAVE ANY SHORTNESS OF BREATH, CHEST PAIN, WORSENING SHAKING/WEAKNESS  Do the following things EVERYDAY: 1) Weigh yourself in the morning before breakfast. Write it down and keep it in a log. 2) Take your medicines as prescribed 3) Eat low salt foods-Limit salt (sodium) to 2000 mg per day. Best thing to do is avoid processed foods.   4) Stay as active as you can everyday 5) Limit all fluids for the day to less than 2 liters  Call your doctor if:  Anytime you have any of the following symptoms:  1) 3 pound weight gain in 24 hours or 5 pounds in 1 week  2) shortness of breath, with or without a dry hacking cough  3) swelling in the hands, feet or stomach  4) if you have to sleep on extra pillows at night in order to breathe. 5) after laying down at night for 20-30 mins, you wake up short of breath.   These can all be signs of fluid overload.   Serotonin Syndrome Serotonin is a brain chemical that regulates the nervous system, which includes the brain, spinal cord, and nerves. Serotonin appears to play a role in all types of behavior, including appetite, emotions, movement, thinking, and response to stress. Excessively high levels of serotonin in the body can cause serotonin syndrome, which is a very dangerous condition. CAUSES This condition can be caused by taking medicines or drugs that increase the level of serotonin in your body. These include:  Antidepressant medicines.  Migraine medicines.  Certain pain medicines.  Certain recreational drugs, including ecstasy, LSD, cocaine, and amphetamines.  Over-the-counter cough or cold medicines that contain dextromethorphan.  Certain herbal supplements, including St. John's wort, ginseng, and nutmeg. This condition usually occurs when you take these medicines or drugs in  combination, but it can also happen with a high dose of a single medicine or drug. RISK FACTORS This condition is more likely to develop in:  People who have recently increased the dosage of medicine that increases the serotonin level.  People who just started taking medicine that increases the serotonin level. SYMPTOMS Symptoms of this condition usually happens within several hours of a medicine change. Symptoms include:  Headache.  Muscle twitching or stiffness.  Diarrhea.  Confusion.  Restlessness or agitation.  Shivering or goose bumps.  Loss of muscle coordination.  Rapid heart rate.  Sweating. Severe cases of serotonin syndromecan cause:  Irregular heartbeat.  Seizures.  Loss of consciousness.  High fever. DIAGNOSIS This condition is diagnosed with a medical history and physical exam. You will be asked aboutyour symptoms and your use of medicines and recreational drugs. Your health care provider may also order lab work or additional tests to rule out other causes of your symptoms. TREATMENT The treatment for this condition depends on the severity of your symptoms. For mild cases, stopping the medicine that caused your condition is usually all that is needed. For moderate to severe cases, hospitalization is required to monitor you and to prevent further muscle damage. HOME CARE INSTRUCTIONS  Take over-the-counter and prescription medicines only as told by your health care provider. This is important.  Check with your health care provider before you start taking any new prescriptions, over-the-counter medicines, herbs, or supplements.  Avoid combining any medicines that can cause this condition to  occur.  Keep all follow-up visits as told by your health care provider.This is important.  Maintain a healthy lifestyle.  Eat healthy foods.  Get plenty of sleep.  Exercise regularly.  Do not drink alcohol.  Do not use recreational drugs. SEEK MEDICAL CARE  IF:  Medicines do not seem to be helping.  Your symptoms do not improve or they get worse.  You have trouble taking care of yourself. SEEK IMMEDIATE MEDICAL CARE IF:  You have worsening confusion, severe headache, chest pain, high fever, seizures, or loss of consciousness.  You have serious thoughts about hurting yourself or others.  You experience serious side effects of medicine, such as swelling of your face, lips, tongue, or throat.   This information is not intended to replace advice given to you by your health care provider. Make sure you discuss any questions you have with your health care provider.   Document Released: 08/16/2004 Document Revised: 11/23/2014 Document Reviewed: 07/22/2014 Elsevier Interactive Patient Education [employer].

## 2015-08-05 NOTE — Progress Notes (Signed)
Subjective:    Patient ID: Alyssa [name], female    DOB: [birth year], 70 y.o.   MRN: [id number]  HPI 70 y.o. WF with history of dCHF, HTN, CKD, bipolar/depression, DM2 presents for overdue follow up and presents with dropping books and possible UTI.    Her blood pressure has been controlled at home, today their BP is BP: 138/72 mmHg  She does not workout. She denies chest pain, shortness of breath, dizziness.  She is on cholesterol medication and denies myalgias. Her cholesterol is at goal. The cholesterol last visit was:   Lab Results  Component Value Date   CHOL 206* 03/31/2015   HDL 62 03/31/2015   LDLCALC 116 03/31/2015   TRIG 138 03/31/2015   CHOLHDL 3.3 03/31/2015    She has been working on diet and exercise for diabetes with CKD stage 3, and denies paresthesia of the feet, polydipsia, polyuria and visual disturbances. Last A1C in the office was:  Lab Results  Component Value Date   HGBA1C 8.4* 03/31/2015   Patient is on Vitamin D supplement.   Lab Results  Component Value Date   VD25OH 46 03/31/2015     She states she has been having decreased motivation and fatigue. She has been having dark urine with frequent urination. She has been stressed with her husbands health and with her family. She also this AM has been having spasms or involuntary muscle spasms in her arms that have caused her to drop books. She will close her eyes and feel the floor coming up to her face. She has had hallucinations before with gabapentin TID, and she is currently taking 2 in the AM and 1 at night. She is on trazodone, zoloft in addition the the gabapetnin.  She is on thyroid medication. Her medication was not changed last visit.   Lab Results  Component Value Date   TSH 1.981 03/31/2015  .    Blood pressure 138/72, pulse 84, temperature 99 F (37.2 C), temperature source Temporal, resp. rate 16, height 5' 3.5" (1.613 m), weight 233 lb (105.688 kg), SpO2 90 %.  Past Medical History   Diagnosis Date  . Osteoporosis   . Diabetes mellitus without complication ([place])   . Hypertension   . Bipolar 1 disorder ([place])   . Hyperlipidemia   . Anemia   . Cataract   . Glaucoma   . Chronic back pain   . Hypothyroidism    Current Outpatient Prescriptions on File Prior to Visit  Medication Sig Dispense Refill  . ACCU-CHEK AVIVA PLUS test strip     . aspirin 81 MG tablet Take 81 mg by mouth daily.    Marland Kitchen atorvastatin (LIPITOR) 80 MG tablet TAKE 1 TABLET(80 MG) BY MOUTH DAILY 90 tablet 1  . Blood Glucose Monitoring Suppl (ACCU-CHEK AVIVA PLUS) W/DEVICE KIT   0  . Cholecalciferol (VITAMIN D-3 PO) Take 5,000 Units by mouth every evening.     . Cyanocobalamin (VITAMIN B 12 PO) Take 1,000 mcg by mouth every other day.    . DUREZOL 0.05 % EMUL INT 1 GTT INTO  OD QID  0  . furosemide (LASIX) 40 MG tablet TAKE 1 TABLET BY MOUTH TWICE DAILY 180 tablet 0  . gabapentin (NEURONTIN) 800 MG tablet Take 1 tablet (800 mg total) by mouth 3 (three) times daily. 90 tablet 5  . glipiZIDE (GLUCOTROL) 10 MG tablet TAKE 1 TABLET BY MOUTH THREE TIMES DAILY 90 tablet 5  . latanoprost (XALATAN) 0.005 % ophthalmic solution  Place 1 drop into both eyes every evening.     Marland Kitchen levothyroxine (SYNTHROID, LEVOTHROID) 50 MCG tablet TAKE 1 TABLET BY MOUTH DAILY BEFORE BREKFAST 90 tablet 1  . losartan (COZAAR) 100 MG tablet TAKE 1 TABLET BY MOUTH DAILY 90 tablet 2  . MAGNESIUM-OXIDE 400 (241.3 Mg) MG tablet TAKE 1 TABLET BY MOUTH TWICE DAILY 180 tablet 1  . metFORMIN (GLUCOPHAGE-XR) 500 MG 24 hr tablet TAKE 2 TABLETS BY MOUTH TWICE DAILY FOR DIABETES 360 tablet 2  . minoxidil (LONITEN) 10 MG tablet TAKE 1/2 TO 1 TABLET BY MOUTH DAILY AS DIRECTED FOR BLOOD PRESSURE 90 tablet 1  . Multiple Vitamin (MULTIVITAMIN) tablet Take 1 tablet by mouth daily.    . Omega-3 Fatty Acids (FISH OIL) 1000 MG CAPS Take 1,000 mg by mouth every evening.     . phentermine (ADIPEX-P) 37.5 MG tablet Take 1 tablet (37.5 mg total) by mouth daily  before breakfast. 30 tablet 0  . Probiotic Product (PROBIOTIC DAILY PO) Take 1 tablet by mouth every morning.     . sertraline (ZOLOFT) 50 MG tablet Take 1 tablet (50 mg total) by mouth daily. 90 tablet 1  . timolol (TIMOPTIC) 0.5 % ophthalmic solution INSTILL 1 DROP INTO BOTH EYES TWICE DAILY 5 mL 1  . traZODone (DESYREL) 150 MG tablet TAKE 1/3 TO 1/2 TO 1 TABLET BY MOUTH EVERY NIGHT AT BEDTIME AS NEEDED FOR SLEEP 90 tablet 1  . VIGAMOX 0.5 % ophthalmic solution INSTILL 1 DROP INTO RIGHT EYE QID STARTING 2 DAYS PRIOR TO SURGERY AND INSTILL 2 DROPS THE MORNING OF SURGERY  0   No current facility-administered medications on file prior to visit.   Review of Systems  Constitutional: Negative for fever, chills and diaphoresis.  HENT: Negative.   Respiratory: Negative.   Cardiovascular: Positive for leg swelling. Negative for chest pain and palpitations.  Gastrointestinal: Negative.   Genitourinary: Negative.   Musculoskeletal: Positive for myalgias. Negative for back pain and neck pain.  Skin: Negative.   Neurological: Positive for tremors. Negative for weakness.  Psychiatric/Behavioral: Negative.        Objective:   Physical Exam  Constitutional: She is oriented to person, place, and time. She appears well-developed and well-nourished. She does not have a sickly appearance. No distress.  HENT:  Nose: Right sinus exhibits no maxillary sinus tenderness and no frontal sinus tenderness. Left sinus exhibits no maxillary sinus tenderness and no frontal sinus tenderness.  Mouth/Throat: Uvula is midline and oropharynx is clear and moist. No oropharyngeal exudate.  Neck: Trachea normal, normal range of motion and phonation normal. Neck supple. No tracheal tenderness present. No tracheal deviation present.  Cardiovascular: Normal rate, regular rhythm and normal heart sounds.   No murmur heard. Pulmonary/Chest: Effort normal. No stridor. No respiratory distress. She has no decreased breath sounds.  She has no wheezes. She has no rhonchi. She has no rales. She exhibits no tenderness.  02 RA 98%  Abdominal: Soft. Bowel sounds are normal. There is no tenderness. There is no rebound and no guarding.  Obese abdomen  Neurological: She is alert and oriented to person, place, and time. She has normal strength. She displays no tremor. No cranial nerve deficit or sensory deficit. She exhibits normal muscle tone. Coordination and gait abnormal.  Gait is antalgic with a walker.   Skin: Skin is warm and dry. No rash noted. She is not diaphoretic.  Psychiatric: She has a normal mood and affect.  Vitals reviewed.     Assessment &  Plan:   1. Essential hypertension - continue medications, DASH diet, exercise and monitor at home. Call if greater than 130/80.  - CBC with Differential/Platelet - Hepatic function panel  2. Type 2 diabetes mellitus with other diabetic kidney complication, without long-term current use of insulin ([place]) Discussed general issues about diabetes pathophysiology and management., Educational material distributed., Suggested low cholesterol diet., Encouraged aerobic exercise., Discussed foot care., Reminded to get yearly retinal exam. - Hemoglobin A1c  3. Hypothyroidism, unspecified hypothyroidism type - TSH  4. CKD (chronic kidney disease) stage 3, GFR 30-59 ml/min - BASIC METABOLIC PANEL WITH GFR  5. Hyperlipidemia - Lipid panel  6. Tremor Tremors/shaking/involuntary muscle reactions- no focal deficeits? From CKD with serotonin syndrome/tardive dyskinesia- no tremors at this time but will decrease gabapentin and stop trazodone, and possible need referral to neuro.  7. Morbid obesity, unspecified obesity type ([place]) Obesity with co morbidities- long discussion about weight loss, diet, and exercise  8. Urinary frequency - Urinalysis, Routine w reflex microscopic (not at [facility]) - Urine culture  9. Medication management - Magnesium

## 2015-08-06 LAB — URINALYSIS, MICROSCOPIC ONLY
BACTERIA UA: NONE SEEN [HPF]
CASTS: NONE SEEN [LPF]
Crystals: NONE SEEN [HPF]
RBC / HPF: NONE SEEN RBC/HPF (ref <=2)
SQUAMOUS EPITHELIAL / LPF: NONE SEEN [HPF] (ref <=5)
YEAST: NONE SEEN [HPF]

## 2015-08-06 LAB — URINALYSIS, ROUTINE W REFLEX MICROSCOPIC
BILIRUBIN URINE: NEGATIVE
Glucose, UA: NEGATIVE
HGB URINE DIPSTICK: NEGATIVE
KETONES UR: NEGATIVE
NITRITE: NEGATIVE
PH: 6 (ref 5.0–8.0)
SPECIFIC GRAVITY, URINE: 1.01 (ref 1.001–1.035)

## 2015-08-06 LAB — TSH: TSH: 1.828 u[IU]/mL (ref 0.350–4.500)

## 2015-08-06 LAB — URINE CULTURE

## 2015-08-08 ENCOUNTER — Telehealth: Payer: Self-pay

## 2015-08-08 NOTE — Telephone Encounter (Signed)
Pt aware of lab results and voiced understanding  

## 2015-08-08 NOTE — Telephone Encounter (Signed)
-----   Message from Vicie Mutters, [place] sent at 08/06/2015  9:26 AM EST ----- Regarding: Call about labs too She does my chart and needs a call.

## 2015-08-12 ENCOUNTER — Other Ambulatory Visit: Payer: Self-pay | Admitting: Physician Assistant

## 2015-08-12 MED ORDER — ALPRAZOLAM 0.5 MG PO TABS: ORAL_TABLET | ORAL | Status: DC

## 2015-08-12 NOTE — Progress Notes (Signed)
Called into [facility].

## 2015-08-16 ENCOUNTER — Ambulatory Visit (INDEPENDENT_AMBULATORY_CARE_PROVIDER_SITE_OTHER): Payer: Medicare Other | Admitting: Internal Medicine

## 2015-08-16 ENCOUNTER — Encounter: Payer: Self-pay | Admitting: Internal Medicine

## 2015-08-16 VITALS — BP 136/76 | HR 92 | Temp 97.7°F | Resp 16 | Ht 63.0 in | Wt 213.8 lb

## 2015-08-16 DIAGNOSIS — R531 Weakness: Secondary | ICD-10-CM

## 2015-08-16 NOTE — Patient Instructions (Signed)
Stop Gabapentin. 

## 2015-08-16 NOTE — Progress Notes (Signed)
Subjective:    Patient ID: Alyssa [name], female    DOB: 1946-03-31, 70 y.o.   MRN: [id number]  HPI  Patient is a very nice 70 yo MWF with HTN, HLD, T2_DM and Bipolar illnes  who is seen today for f/u of c/o muscle twitching, irritability and general malaise that she attributes to the stresses of caring for her husband. She was advised to d/c her night time Trazodone and also to taper her Gabapentin from 3 to 2 pills/day. She reports that she feels somewhat better and is generally very vague in answering any question posed to her. She denies any increase in pain sx's with tapering her Gabapentin.   Medication Sig  . ACCU-CHEK AVIVA PLUS test strip   . ALPRAZolam (XANAX) 0.5 MG tablet 1-2 at night for sleep  . aspirin 81 MG tablet Take 81 mg by mouth daily.  Marland Kitchen atorvastatin (LIPITOR) 80 MG tablet TAKE 1 TABLET(80 MG) BY MOUTH DAILY  . Blood Glucose Monitoring Suppl (ACCU-CHEK AVIVA PLUS) W/DEVICE KIT   . Cholecalciferol (VITAMIN D-3 PO) Take 5,000 Units by mouth every evening.   . Cyanocobalamin (VITAMIN B 12 PO) Take 1,000 mcg by mouth every other day.  . DUREZOL 0.05 % EMUL INT 1 GTT INTO  OD QID  . furosemide (LASIX) 40 MG tablet TAKE 1 TABLET BY MOUTH TWICE DAILY  . glipiZIDE (GLUCOTROL) 10 MG tablet TAKE 1 TABLET BY MOUTH THREE TIMES DAILY  . latanoprost (XALATAN) 0.005 % ophthalmic solution Place 1 drop into both eyes every evening.   Marland Kitchen levothyroxine (SYNTHROID, LEVOTHROID) 50 MCG tablet TAKE 1 TABLET BY MOUTH DAILY BEFORE BREKFAST  . losartan  100 MG tablet TAKE 1 TABLET BY MOUTH DAILY  . MAGNESIUM 400  MG tablet TAKE 1 TABLET BY MOUTH TWICE DAILY  . metFORMIN-XR 500 MG  TAKE 2 TABLETS BY MOUTH TWICE DAILY FOR DIABETES  . minoxidil  10 MG TAKE 1/2 TO 1 TABLET BY MOUTH DAILY AS DIRECTED FOR BLOOD PRESSURE  . Multiple Vitamin  Take 1 tablet by mouth daily.  . Omega-3 FISH OIL 1000 MG  Take 1,000 mg by mouth every evening.   . phentermine 37.5 MG tablet Take 1 tablet (37.5 mg total) by  mouth daily before breakfast.  . Probiotic Take 1 tablet by mouth every morning.   . sertraline  50 MG tablet Take 1 tablet (50 mg total) by mouth daily.  Marland Kitchen TIMOPTIC 0.5 % ophth soln INSTILL 1 DROP INTO BOTH EYES TWICE DAILY  . gabapentin  800 MG tablet Take 1 tablet (800 mg total) by mouth 3 (three) times daily.   Allergies  Allergen Reactions  . Ciprofloxacin Hcl Diarrhea  . Cymbalta [Duloxetine Hcl] Other (See Comments)    Per pt: it did not work  . Codeine Other (See Comments)    Per pt: unknown  . Lortab [Hydrocodone-Acetaminophen] Other (See Comments)    Per pt: unknown  . Oxycodone Other (See Comments)    Per pt: unknown   Past Medical History  Diagnosis Date  . Osteoporosis   . Diabetes mellitus without complication ([place])   . Hypertension   . Bipolar 1 disorder ([place])   . Hyperlipidemia   . Anemia   . Cataract   . Glaucoma   . Chronic back pain   . Hypothyroidism    Past Surgical History  Procedure Laterality Date  . Back surgery  2007  . Knee arthroscopy Bilateral   . Dilation and curettage of uterus    .  Breast surgery Left     CYST REMOVAL  . Lumbar fusion  2009  . Eye surgery Left July 2015    Lt CE/ IOL implant  . Eye surgery Right July 2016    Rt CE/IOL   Review of Systems   10 point systems review negative except as above.    Objective:   Physical Exam  BP 136/76 mmHg  Pulse 92  Temp(Src) 97.7 F (36.5 C)  Resp 16  Ht '5\' 3"'$  (1.6 m)  Wt 213 lb 12.8 oz (96.979 kg)  BMI 37.88 kg/m2  HEENT - Eac's patent. TM's Nl. EOM's full. PERRLA. NasoOroPharynx clear. Neck - supple. Nl Thyroid. Carotids 2+ & No bruits, nodes, JVD Chest - Clear equal BS w/o Rales, rhonchi, wheezes. Cor - Nl HS. RRR w/o sig MGR. PP 1(+). No edema. Abd - No palpable organomegaly, masses or tenderness. BS nl. MS- FROM w/o deformities. Muscle power, tone and bulk Nl. Gait Nl. Neuro - No obvious Cr N abnormalities. Sensory, motor and Cerebellar functions appear Nl w/o focal  abnormalities. Psyche - Mental status normal & appropriate.     Assessment & Plan:   1. Weakness  - advised to d/c Gabapentin - Discussed meds & SE's - ROV prn

## 2015-08-26 ENCOUNTER — Other Ambulatory Visit: Payer: Self-pay | Admitting: Internal Medicine

## 2015-08-29 ENCOUNTER — Other Ambulatory Visit: Payer: Self-pay | Admitting: Internal Medicine

## 2015-08-29 ENCOUNTER — Telehealth: Payer: Self-pay | Admitting: Internal Medicine

## 2015-08-29 NOTE — Telephone Encounter (Signed)
Husband calls reporting that the patient has not been eating much and has not been willing to get out of the bed since last visit to the office.  It has been at least 2 weeks since visit and doubt 2 days will make difference in course of treatment.  Will let patients husband know we are aware of his not but will not change anything.

## 2015-08-31 ENCOUNTER — Encounter: Payer: Self-pay | Admitting: Internal Medicine

## 2015-08-31 ENCOUNTER — Ambulatory Visit (INDEPENDENT_AMBULATORY_CARE_PROVIDER_SITE_OTHER): Payer: Medicare Other | Admitting: Internal Medicine

## 2015-08-31 VITALS — BP 144/72 | HR 76 | Temp 98.0°F | Resp 14 | Ht 63.5 in | Wt 206.0 lb

## 2015-08-31 DIAGNOSIS — R630 Anorexia: Secondary | ICD-10-CM

## 2015-08-31 DIAGNOSIS — F329 Major depressive disorder, single episode, unspecified: Secondary | ICD-10-CM

## 2015-08-31 DIAGNOSIS — F32A Depression, unspecified: Secondary | ICD-10-CM

## 2015-08-31 MED ORDER — SERTRALINE HCL 100 MG PO TABS: 100.0000 mg | ORAL_TABLET | Freq: Every day | ORAL | Status: DC

## 2015-08-31 MED ORDER — MIRTAZAPINE 15 MG PO TABS: 15.0000 mg | ORAL_TABLET | Freq: Every day | ORAL | Status: DC

## 2015-08-31 NOTE — Progress Notes (Signed)
Subjective:    Patient ID: Alyssa [name], female    DOB: [birth year], 70 y.o.   MRN: [id number]  HPI  Patient presents to the office for evaluation of fatigue, not getting out of bed, and lack of appetite.  She was seen by Dr. Melford Aase 3 weeks ago and was taken off gabapentin.  Her husband reports that she has been staying in the bed and won't get out and will eat minimal amounts.  She reports that she doesn't feel like doing anything at all.  She reports that right now it is just more than she can stand.  She reports that she just can't eat.  She reports that she cannot taste any food and nothing tastes right.  She has been eating jello, cheese, crackers, and some oatmeal.  She feels like she is extremely fatigued and tired.  Her husband reports that this is not her normal.  She does not take the xanax.  She does not take anything right now for sleep.  She feels like she isn't resting.  She doesn't think that she takes zoloft currently.  She reports that she hasn't been well and her husband hasn't been well and she has been having a hard time dealing with the stress of the situation of being unwell.      Review of Systems  Constitutional: Positive for chills and fatigue. Negative for fever.  HENT: Negative for congestion, ear pain, postnasal drip, sinus pressure, sore throat and tinnitus.   Respiratory: Negative for chest tightness, shortness of breath and wheezing.   Cardiovascular: Negative for chest pain.  Gastrointestinal: Negative for nausea, vomiting, diarrhea, constipation, abdominal distention and rectal pain.       Objective:   Physical Exam  Constitutional: She is oriented to person, place, and time. She appears well-developed and well-nourished. No distress.  HENT:  Head: Normocephalic.  Mouth/Throat: Oropharynx is clear and moist. No oropharyngeal exudate.  Eyes: Conjunctivae are normal. No scleral icterus.  Neck: Normal range of motion. Neck supple. No JVD present. No  thyromegaly present.  Cardiovascular: Normal rate, regular rhythm, normal heart sounds and intact distal pulses.  Exam reveals no gallop and no friction rub.   No murmur heard. Pulmonary/Chest: Effort normal and breath sounds normal. No respiratory distress. She has no wheezes. She has no rales. She exhibits no tenderness.  Abdominal: Soft. Bowel sounds are normal. She exhibits no distension and no mass. There is no tenderness. There is no rebound and no guarding.  Musculoskeletal: Normal range of motion.  Lymphadenopathy:    She has no cervical adenopathy.  Neurological: She is alert and oriented to person, place, and time.  Skin: Skin is warm and dry. She is not diaphoretic.  Psychiatric: Judgment normal. She is slowed and withdrawn. She is not agitated, not aggressive, not hyperactive, not actively hallucinating and not combative. Cognition and memory are normal. She exhibits a depressed mood. She expresses no homicidal and no suicidal ideation. She expresses no suicidal plans and no homicidal plans.  Extremely slow speech, Depressed flat affect.    Nursing note and vitals reviewed.   Filed Vitals:   08/31/15 1050  BP: 144/72  Pulse: 76  Temp: 98 F (36.7 C)  Resp: 14           Assessment & Plan:   Physcial exam at baseline.  Given affect and symptoms think that this is likely a major depressive episode related to bipolar disorder.  Will try increasing zoloft and using remeron for  mood but approching failure to thrive and if worsens will likely need psych hospitalization for adjustment of medications.   1. Depression  - sertraline (ZOLOFT) 100 MG tablet; Take 1 tablet (100 mg total) by mouth daily.  Dispense: 30 tablet; Refill: 2  2. Decreased appetite  - mirtazapine (REMERON) 15 MG tablet; Take 1 tablet (15 mg total) by mouth at bedtime.  Dispense: 30 tablet; Refill: 2

## 2015-08-31 NOTE — Patient Instructions (Signed)
Mirtazapine tablets What is this medicine? MIRTAZAPINE (mir TAZ a peen) is used to treat depression. This medicine may be used for other purposes; ask your health care provider or pharmacist if you have questions. What should I tell my health care provider before I take this medicine? They need to know if you have any of these conditions: -bipolar disorder -glaucoma -kidney disease -liver disease -suicidal thoughts -an unusual or allergic reaction to mirtazapine, other medicines, foods, dyes, or preservatives -pregnant or trying to get pregnant -breast-feeding How should I use this medicine? Take this medicine by mouth with a glass of water. Follow the directions on the prescription label. Take your medicine at regular intervals. Do not take your medicine more often than directed. Do not stop taking this medicine suddenly except upon the advice of your doctor. Stopping this medicine too quickly may cause serious side effects or your condition may worsen. A special MedGuide will be given to you by the pharmacist with each prescription and refill. Be sure to read this information carefully each time. Talk to your pediatrician regarding the use of this medicine in children. Special care may be needed. Overdosage: If you think you have taken too much of this medicine contact a poison control center or emergency room at once. NOTE: This medicine is only for you. Do not share this medicine with others. What if I miss a dose? If you miss a dose, take it as soon as you can. If it is almost time for your next dose, take only that dose. Do not take double or extra doses. What may interact with this medicine? Do not take this medicine with any of the following medications: -linezolid -MAOIs like Carbex, Eldepryl, Marplan, Nardil, and Parnate -methylene blue (injected into a vein) This medicine may also interact with the following medications: -alcohol -antiviral medicines for HIV or AIDS -certain  medicines that treat or prevent blood clots like warfarin -certain medicines for depression, anxiety, or psychotic disturbances -certain medicines for fungal infections like ketoconazole and itraconazole -certain medicines for migraine headache like almotriptan, eletriptan, frovatriptan, naratriptan, rizatriptan, sumatriptan, zolmitriptan -certain medicines for seizures like carbamazepine or phenytoin -certain medicines for sleep -cimetidine -erythromycin -fentanyl -lithium -medicines for blood pressure -nefazodone -rasagiline -rifampin -supplements like St. John's wort, kava kava, valerian -tramadol -tryptophan This list may not describe all possible interactions. Give your health care provider a list of all the medicines, herbs, non-prescription drugs, or dietary supplements you use. Also tell them if you smoke, drink alcohol, or use illegal drugs. Some items may interact with your medicine. What should I watch for while using this medicine? Tell your doctor if your symptoms do not get better or if they get worse. Visit your doctor or health care professional for regular checks on your progress. Because it may take several weeks to see the full effects of this medicine, it is important to continue your treatment as prescribed by your doctor. Patients and their families should watch out for new or worsening thoughts of suicide or depression. Also watch out for sudden changes in feelings such as feeling anxious, agitated, panicky, irritable, hostile, aggressive, impulsive, severely restless, overly excited and hyperactive, or not being able to sleep. If this happens, especially at the beginning of treatment or after a change in dose, call your health care professional. You may get drowsy or dizzy. Do not drive, use machinery, or do anything that needs mental alertness until you know how this medicine affects you. Do not stand or sit up   quickly, especially if you are an older patient. This  reduces the risk of dizzy or fainting spells. Alcohol may interfere with the effect of this medicine. Avoid alcoholic drinks. This medicine may cause dry eyes and blurred vision. If you wear contact lenses you may feel some discomfort. Lubricating drops may help. See your eye doctor if the problem does not go away or is severe. Your mouth may get dry. Chewing sugarless gum or sucking hard candy, and drinking plenty of water may help. Contact your doctor if the problem does not go away or is severe. What side effects may I notice from receiving this medicine? Side effects that you should report to your doctor or health care professional as soon as possible: -allergic reactions like skin rash, itching or hives, swelling of the face, lips, or tongue -breathing problems -confusion -fever, sore throat, or mouth ulcers or blisters -flu like symptoms including fever, chills, cough, muscle or joint aches and pains -stomach pain with nausea and/or vomiting -suicidal thoughts or other mood changes -swelling of the hands or feet -unusual bleeding or bruising -unusually weak or tired -vomiting Side effects that usually do not require medical attention (report to your doctor or health care professional if they continue or are bothersome): -constipation -increased appetite -weight gain This list may not describe all possible side effects. Call your doctor for medical advice about side effects. You may report side effects to FDA at 1-800-FDA-1088. Where should I keep my medicine? Keep out of the reach of children. Store at room temperature between 15 and 30 degrees C (59 and 86 degrees F) Protect from light and moisture. Throw away any unused medicine after the expiration date. NOTE: This sheet is a summary. It may not cover all possible information. If you have questions about this medicine, talk to your doctor, pharmacist, or health care provider.    2016, Elsevier/Gold Standard. (2013-01-30  13:11:19)    

## 2015-09-15 ENCOUNTER — Ambulatory Visit (INDEPENDENT_AMBULATORY_CARE_PROVIDER_SITE_OTHER): Payer: Medicare Other | Admitting: Internal Medicine

## 2015-09-15 ENCOUNTER — Encounter: Payer: Self-pay | Admitting: Internal Medicine

## 2015-09-15 VITALS — BP 154/80 | HR 74 | Temp 98.4°F | Resp 16 | Ht 63.5 in | Wt 214.0 lb

## 2015-09-15 DIAGNOSIS — I10 Essential (primary) hypertension: Secondary | ICD-10-CM

## 2015-09-15 DIAGNOSIS — F319 Bipolar disorder, unspecified: Secondary | ICD-10-CM | POA: Diagnosis not present

## 2015-09-15 MED ORDER — FREESTYLE LANCETS MISC: Status: DC

## 2015-09-15 NOTE — Progress Notes (Signed)
Patient ID: EVALENA FUJII, female   DOB: Nov 17, 1945, 70 y.o.   MRN: [id number]  Assessment and Plan:   1. Essential hypertension -has recently been eating a lot of soy sauce and sodium -cut back on sodium -monitor -if consistently stays high will change meds  2. Bipolar 1 disorder ([place]) -seems to be improved -stop remeron  -continue increased zoloft at 100 mg   3. GERD -dexilant samples x 2 weeks    HPI 70 y.o.female presents for 1 month follow up of depression, fatigue, and weakness which was significantly worse at the last meeting.  We sent her home on increased zoloft to 100 mg and also was given remeron.   She feels like she is doing much better.  She is no longer having any visual changes.  She reports that she is taking remeron but feels like it is making her a little bit sleepy during the day time and sometimes she does have some jitteriness with restless legs.  She reports that she is back to eating better.  She reports that she has been getting some more reflux after eating.  She reports that she did have this after [place] food.  Patient reports that they have been doing well.  female is taking their medication.  They are having difficulty with their medications.  They report no adverse reactions.    Past Medical History  Diagnosis Date  . Osteoporosis   . Diabetes mellitus without complication ([place])   . Hypertension   . Bipolar 1 disorder ([place])   . Hyperlipidemia   . Anemia   . Cataract   . Glaucoma   . Chronic back pain   . Hypothyroidism      Allergies  Allergen Reactions  . Ciprofloxacin Hcl Diarrhea  . Cymbalta [Duloxetine Hcl] Other (See Comments)    Per pt: it did not work  . Codeine Other (See Comments)    Per pt: unknown  . Lortab [Hydrocodone-Acetaminophen] Other (See Comments)    Per pt: unknown  . Oxycodone Other (See Comments)    Per pt: unknown      Current Outpatient Prescriptions on File Prior to Visit  Medication Sig Dispense Refill  .  ACCU-CHEK AVIVA PLUS test strip     . ALPRAZolam (XANAX) 0.5 MG tablet 1-2 at night for sleep 60 tablet 0  . aspirin 81 MG tablet Take 81 mg by mouth daily.    Marland Kitchen atorvastatin (LIPITOR) 80 MG tablet TAKE 1 TABLET(80 MG) BY MOUTH DAILY 90 tablet 1  . Blood Glucose Monitoring Suppl (ACCU-CHEK AVIVA PLUS) W/DEVICE KIT   0  . Cholecalciferol (VITAMIN D-3 PO) Take 5,000 Units by mouth every evening.     . Cyanocobalamin (VITAMIN B 12 PO) Take 1,000 mcg by mouth every other day.    . furosemide (LASIX) 40 MG tablet TAKE 1 TABLET BY MOUTH TWICE DAILY 180 tablet 0  . glipiZIDE (GLUCOTROL) 10 MG tablet TAKE 1 TABLET BY MOUTH THREE TIMES DAILY 90 tablet 5  . latanoprost (XALATAN) 0.005 % ophthalmic solution Place 1 drop into both eyes every evening.     Marland Kitchen levothyroxine (SYNTHROID, LEVOTHROID) 50 MCG tablet TAKE 1 TABLET BY MOUTH DAILY BEFORE BREKFAST 90 tablet 1  . losartan (COZAAR) 100 MG tablet TAKE 1 TABLET BY MOUTH DAILY 90 tablet 2  . MAGNESIUM-OXIDE 400 (241.3 Mg) MG tablet TAKE 1 TABLET BY MOUTH TWICE DAILY 180 tablet 1  . metFORMIN (GLUCOPHAGE-XR) 500 MG 24 hr tablet TAKE 2 TABLETS BY MOUTH  TWICE DAILY FOR DIABETES 360 tablet 2  . minoxidil (LONITEN) 10 MG tablet TAKE 1/2 TO 1 TABLET BY MOUTH DAILY AS DIRECTED FOR BLOOD PRESSURE 90 tablet 1  . mirtazapine (REMERON) 15 MG tablet Take 1 tablet (15 mg total) by mouth at bedtime. 30 tablet 2  . Multiple Vitamin (MULTIVITAMIN) tablet Take 1 tablet by mouth daily.    . Omega-3 Fatty Acids (FISH OIL) 1000 MG CAPS Take 1,000 mg by mouth every evening.     . phentermine (ADIPEX-P) 37.5 MG tablet Take 1 tablet (37.5 mg total) by mouth daily before breakfast. 30 tablet 0  . Probiotic Product (PROBIOTIC DAILY PO) Take 1 tablet by mouth every morning.     . sertraline (ZOLOFT) 100 MG tablet Take 1 tablet (100 mg total) by mouth daily. 30 tablet 2  . timolol (TIMOPTIC) 0.5 % ophthalmic solution INSTILL 1 DROP INTO BOTH EYES TWICE DAILY 5 mL 1   No current  facility-administered medications on file prior to visit.    ROS: all negative except above.   Physical Exam: Filed Weights   09/15/15 1354  Weight: 214 lb (97.07 kg)   BP 154/80 mmHg  Pulse 74  Temp(Src) 98.4 F (36.9 C) (Temporal)  Resp 16  Ht 5' 3.5" (1.613 m)  Wt 214 lb (97.07 kg)  BMI 37.31 kg/m2 General Appearance: Well developed well nourished, non-toxic appearing in no apparent distress. Eyes: PERRLA, EOMs, conjunctiva w/ no swelling or erythema or discharge Sinuses: No Frontal/maxillary tenderness ENT/Mouth: Ear canals clear without swelling or erythema.  TM's normal bilaterally with no retractions, bulging, or loss of landmarks.   Neck: Supple, thyroid normal, no notable JVD  Respiratory: Respiratory effort normal, Clear breath sounds anteriorly and posteriorly bilaterally without rales, rhonchi, wheezing or stridor. No retractions or accessory muscle usage. Cardio: RRR with no MRGs.   Abdomen: Soft, + BS.  Non tender, no guarding, rebound, hernias, masses.  Musculoskeletal: Full ROM, 5/5 strength, normal gait.  Skin: Warm, dry without rashes  Neuro: Awake and oriented X 3, Cranial nerves intact. Normal muscle tone, no cerebellar symptoms. Sensation intact.  Psych: normal affect, Insight and Judgment appropriate.     Starlyn Skeans, PA-C 2:15 PM [facility]

## 2015-09-21 ENCOUNTER — Other Ambulatory Visit: Payer: Self-pay | Admitting: Internal Medicine

## 2015-09-22 ENCOUNTER — Encounter: Payer: Self-pay | Admitting: Internal Medicine

## 2015-09-23 DIAGNOSIS — H16201 Unspecified keratoconjunctivitis, right eye: Secondary | ICD-10-CM | POA: Diagnosis not present

## 2015-09-26 ENCOUNTER — Other Ambulatory Visit: Payer: Self-pay | Admitting: Internal Medicine

## 2015-09-26 MED ORDER — DEXLANSOPRAZOLE 60 MG PO CPDR: 60.0000 mg | DELAYED_RELEASE_CAPSULE | Freq: Every day | ORAL | Status: DC

## 2015-09-27 ENCOUNTER — Other Ambulatory Visit: Payer: Self-pay | Admitting: Physician Assistant

## 2015-09-27 ENCOUNTER — Other Ambulatory Visit: Payer: Self-pay | Admitting: *Deleted

## 2015-09-27 ENCOUNTER — Other Ambulatory Visit: Payer: Self-pay | Admitting: Internal Medicine

## 2015-09-27 MED ORDER — OMEPRAZOLE 40 MG PO CPDR: 40.0000 mg | DELAYED_RELEASE_CAPSULE | Freq: Every day | ORAL | Status: DC

## 2015-09-27 MED ORDER — PANTOPRAZOLE SODIUM 40 MG PO TBEC: 40.0000 mg | DELAYED_RELEASE_TABLET | Freq: Every day | ORAL | Status: DC

## 2015-10-04 DIAGNOSIS — H16201 Unspecified keratoconjunctivitis, right eye: Secondary | ICD-10-CM | POA: Diagnosis not present

## 2015-10-04 DIAGNOSIS — H401131 Primary open-angle glaucoma, bilateral, mild stage: Secondary | ICD-10-CM | POA: Diagnosis not present

## 2015-10-04 DIAGNOSIS — E113293 Type 2 diabetes mellitus with mild nonproliferative diabetic retinopathy without macular edema, bilateral: Secondary | ICD-10-CM | POA: Diagnosis not present

## 2015-10-05 ENCOUNTER — Other Ambulatory Visit: Payer: Self-pay | Admitting: Internal Medicine

## 2015-10-19 ENCOUNTER — Other Ambulatory Visit: Payer: Self-pay | Admitting: Physician Assistant

## 2015-10-21 ENCOUNTER — Other Ambulatory Visit: Payer: Self-pay | Admitting: Physician Assistant

## 2015-10-26 ENCOUNTER — Encounter: Payer: Self-pay | Admitting: Internal Medicine

## 2015-10-26 ENCOUNTER — Ambulatory Visit (INDEPENDENT_AMBULATORY_CARE_PROVIDER_SITE_OTHER): Payer: Medicare Other | Admitting: Internal Medicine

## 2015-10-26 VITALS — BP 136/82 | HR 88 | Temp 97.9°F | Resp 16 | Ht 63.0 in | Wt 204.6 lb

## 2015-10-26 DIAGNOSIS — R6889 Other general symptoms and signs: Secondary | ICD-10-CM | POA: Diagnosis not present

## 2015-10-26 DIAGNOSIS — I10 Essential (primary) hypertension: Secondary | ICD-10-CM | POA: Diagnosis not present

## 2015-10-26 DIAGNOSIS — E1122 Type 2 diabetes mellitus with diabetic chronic kidney disease: Secondary | ICD-10-CM

## 2015-10-26 DIAGNOSIS — E039 Hypothyroidism, unspecified: Secondary | ICD-10-CM

## 2015-10-26 DIAGNOSIS — N183 Chronic kidney disease, stage 3 unspecified: Secondary | ICD-10-CM

## 2015-10-26 DIAGNOSIS — E785 Hyperlipidemia, unspecified: Secondary | ICD-10-CM

## 2015-10-26 DIAGNOSIS — M1000 Idiopathic gout, unspecified site: Secondary | ICD-10-CM

## 2015-10-26 DIAGNOSIS — Z0001 Encounter for general adult medical examination with abnormal findings: Secondary | ICD-10-CM

## 2015-10-26 DIAGNOSIS — Z79899 Other long term (current) drug therapy: Secondary | ICD-10-CM

## 2015-10-26 DIAGNOSIS — Z136 Encounter for screening for cardiovascular disorders: Secondary | ICD-10-CM

## 2015-10-26 DIAGNOSIS — M109 Gout, unspecified: Secondary | ICD-10-CM | POA: Diagnosis not present

## 2015-10-26 DIAGNOSIS — Z1212 Encounter for screening for malignant neoplasm of rectum: Secondary | ICD-10-CM

## 2015-10-26 DIAGNOSIS — E1129 Type 2 diabetes mellitus with other diabetic kidney complication: Secondary | ICD-10-CM | POA: Diagnosis not present

## 2015-10-26 LAB — URIC ACID: Uric Acid, Serum: 8 mg/dL — ABNORMAL HIGH (ref 2.4–7.0)

## 2015-10-26 LAB — CBC WITH DIFFERENTIAL/PLATELET
Basophils Absolute: 59 {cells}/uL (ref 0–200)
Basophils Relative: 1 %
Eosinophils Absolute: 177 {cells}/uL (ref 15–500)
Eosinophils Relative: 3 %
HCT: 37.4 % (ref 35.0–45.0)
Hemoglobin: 12.3 g/dL (ref 11.7–15.5)
Lymphocytes Relative: 23 %
Lymphs Abs: 1357 {cells}/uL (ref 850–3900)
MCH: 28.7 pg (ref 27.0–33.0)
MCHC: 32.9 g/dL (ref 32.0–36.0)
MCV: 87.2 fL (ref 80.0–100.0)
MPV: 10.6 fL (ref 7.5–12.5)
Monocytes Absolute: 236 {cells}/uL (ref 200–950)
Monocytes Relative: 4 %
Neutro Abs: 4071 {cells}/uL (ref 1500–7800)
Neutrophils Relative %: 69 %
Platelets: 278 K/uL (ref 140–400)
RBC: 4.29 MIL/uL (ref 3.80–5.10)
RDW: 15.4 % — ABNORMAL HIGH (ref 11.0–15.0)
WBC: 5.9 K/uL (ref 3.8–10.8)

## 2015-10-26 LAB — MAGNESIUM: MAGNESIUM: 1.7 mg/dL (ref 1.5–2.5)

## 2015-10-26 LAB — BASIC METABOLIC PANEL WITHOUT GFR
BUN: 30 mg/dL — ABNORMAL HIGH (ref 7–25)
CO2: 30 mmol/L (ref 20–31)
Calcium: 10.4 mg/dL (ref 8.6–10.4)
Chloride: 102 mmol/L (ref 98–110)
Creat: 1.39 mg/dL — ABNORMAL HIGH (ref 0.60–0.93)
GFR, Est African American: 44 mL/min — ABNORMAL LOW (ref >=60)
GFR, Est Non African American: 38 mL/min — ABNORMAL LOW (ref >=60)
Glucose, Bld: 111 mg/dL — ABNORMAL HIGH (ref 65–99)
Potassium: 3.8 mmol/L (ref 3.5–5.3)
Sodium: 143 mmol/L (ref 135–146)

## 2015-10-26 LAB — LIPID PANEL
Cholesterol: 148 mg/dL (ref 125–200)
HDL: 42 mg/dL — ABNORMAL LOW (ref >=46)
LDL Cholesterol: 76 mg/dL (ref <130)
Total CHOL/HDL Ratio: 3.5 ratio (ref <=5.0)
Triglycerides: 151 mg/dL — ABNORMAL HIGH (ref <150)
VLDL: 30 mg/dL (ref <30)

## 2015-10-26 LAB — HEPATIC FUNCTION PANEL
ALT: 11 U/L (ref 6–29)
AST: 16 U/L (ref 10–35)
Albumin: 4.2 g/dL (ref 3.6–5.1)
Alkaline Phosphatase: 47 U/L (ref 33–130)
Bilirubin, Direct: 0.1 mg/dL (ref <=0.2)
Indirect Bilirubin: 0.5 mg/dL (ref 0.2–1.2)
Total Bilirubin: 0.6 mg/dL (ref 0.2–1.2)
Total Protein: 6.9 g/dL (ref 6.1–8.1)

## 2015-10-26 LAB — HEMOGLOBIN A1C
Hgb A1c MFr Bld: 7.9 % — ABNORMAL HIGH (ref <5.7)
Mean Plasma Glucose: 180 mg/dL

## 2015-10-26 LAB — TSH: TSH: 1.56 mIU/L

## 2015-10-26 NOTE — Patient Instructions (Signed)

## 2015-10-26 NOTE — Progress Notes (Signed)
Patient ID: Alyssa [name], female   DOB: 06/10/1946, 70 y.o.   MRN: [id number]  Annual Screening/Preventative Visit And Comprehensive Evaluation &  Examination  This very nice 70 y.o. MWF  presents for a Wellness/Preventative Visit & comprehensive evaluation and management of multiple medical co-morbidities.  Patient has been followed for HTN, T2_NIDDM, Hyperlipidemia and Vitamin D Deficiency.    HTN predates since 1998. Patient's BP has been controlled at home and patient denies any cardiac symptoms as chest pain, palpitations, shortness of breath, dizziness or ankle swelling. Today's BP: 136/82 mmHg    Patient's hyperlipidemia is controlled with diet and medications. Patient denies myalgias or other medication SE's. Last lipids were  Cholesterol 130; HDL 57; LDL Cholsterol 57; Triglycerides 78 on 08/05/2015:   Patient has Morbid Obesity (BMI 36+) from poor dietary compliance and thus consequence T2_NIDDM predating since 1990 and patient denies reactive hypoglycemic symptoms, visual blurring, diabetic polys or paresthesias. Last A1c was not at goal due to admitted snacking & overeating with A1c  7.7% on 08/05/2015.   Finally, patient has history of Vitamin D Deficiency and last Vitamin D was still relatively low at 46 on 03/31/2015.    Medication Sig  . aspirin 81 MG tablet Take 81 mg by mouth daily.  Marland Kitchen atorvastatin  80 MG tablet TAKE 1 TABLET(80 MG) BY MOUTH DAILY  . VITAMIN D Take 5,000 Units by mouth every evening.   Marland Kitchen VITAMIN B 12 tab Take 1,000 mcg by mouth every other day.  . furosemide  40 MG tablet TAKE 1 TABLET BY MOUTH TWICE DAILY  . glipiZIDE 10 MG tablet TAKE 1 TABLET BY MOUTH THREE TIMES DAILY  . XALATAN ophthalmic solution Place 1 drop into both eyes every evening.   Marland Kitchen levothyroxine  50 MCG tablet TAKE 1 TABLET BY MOUTH DAILY BEFORE BREKFAST  . losartan  100 MG tablet TAKE 1 TABLET BY MOUTH DAILY  . MAGNESIUM(41.3 Mg TAKE 1 TABLET BY MOUTH TWICE DAILY  . metFORMIN-XR 500 MG  TAKE 2  TABLETS BY MOUTH TWICE DAILY FOR DIABETES  . minoxidil  10 MG tablet TAKE 1/2 TO 1 TABLET BY MOUTH DAILY AS DIRECTED FOR BLOOD PRESSURE  . Multiple Vitamin   Take 1 tablet by mouth daily.  . Omega-3 FISH OIL 1000 MG  Take 1,000 mg by mouth every evening.   Marland Kitchen omeprazole  40 MG capsule TAKE 1 CAPSULE(40 MG) BY MOUTH DAILY  . pantoprazole  40 MG tablet TAKE 1 TABLET(40 MG) BY MOUTH DAILY  . phentermine  37.5 MG tablet Take 1 tablet (37.5 mg total) by mouth daily before breakfast.  . sertraline  100 MG tablet Take 1 tablet (100 mg total) by mouth daily.  Marland Kitchen TIMOPTIC  ophth soln INSTILL 1 DROP IN BOTH EYES TWICE DAILY  . PROBIOTIC DAILY  Take 1 tablet by mouth every morning.    Allergies  Allergen Reactions  . Ciprofloxacin Hcl Diarrhea  . Cymbalta [Duloxetine Hcl] Other (See Comments)    Per pt: it did not work  . Xanax [Alprazolam] Other (See Comments)    Excessive sleepiness  . Codeine Other (See Comments)    Per pt: unknown  . Lortab [Hydrocodone-Acetaminophen] Other (See Comments)    Per pt: unknown  . Oxycodone Other (See Comments)    Per pt: unknown   Past Medical History  Diagnosis Date  . Osteoporosis   . Diabetes mellitus without complication ([place])   . Hypertension   . Bipolar 1 disorder ([place])   .  Hyperlipidemia   . Anemia   . Cataract   . Glaucoma   . Chronic back pain   . Hypothyroidism    Health Maintenance  Topic Date Due  . TETANUS/TDAP  07/23/2012  . MAMMOGRAM  12/26/2015  . OPHTHALMOLOGY EXAM  01/17/2016  . HEMOGLOBIN A1C  02/02/2016  . FOOT EXAM  02/14/2016  . INFLUENZA VACCINE  02/21/2016  . COLONOSCOPY  10/08/2018  . DEXA SCAN  Completed  . ZOSTAVAX  Completed  . Hepatitis C Screening  Completed  . PNA vac Low Risk Adult  Completed   Immunization History  Administered Date(s) Administered  . DT 05/18/2014  . Influenza Split 04/22/2013  . Influenza, High Dose Seasonal PF 04/01/2014  . Pneumococcal Conjugate-13 09/02/2014  . Pneumococcal  Polysaccharide-23 07/23/2001, 07/23/2010  . Td 07/23/2002  . Zoster 07/23/2006   Past Surgical History  Procedure Laterality Date  . Back surgery  2007  . Knee arthroscopy Bilateral   . Dilation and curettage of uterus    . Breast surgery Left     CYST REMOVAL  . Lumbar fusion  2009  . Eye surgery Left July 2015    Lt CE/ IOL implant  . Eye surgery Right July 2016    Rt CE/IOL   Family History  Problem Relation Age of Onset  . Adopted: Yes   Social History  Substance Use Topics  . Smoking status: Never Smoker   . Smokeless tobacco: Never Used  . Alcohol Use: Yes     Comment: social drinker- glass of wine or cocktail    ROS Constitutional: Denies fever, chills, weight loss/gain, headaches, insomnia,  night sweats, and change in appetite. Does c/o fatigue. Eyes: Denies redness, blurred vision, diplopia, discharge, itchy, watery eyes.  ENT: Denies discharge, congestion, post nasal drip, epistaxis, sore throat, earache, hearing loss, dental pain, Tinnitus, Vertigo, Sinus pain, snoring.  Cardio: Denies chest pain, palpitations, irregular heartbeat, syncope, dyspnea, diaphoresis, orthopnea, PND, claudication, edema Respiratory: denies cough, dyspnea, DOE, pleurisy, hoarseness, laryngitis, wheezing.  Gastrointestinal: Denies dysphagia, heartburn, reflux, water brash, pain, cramps, nausea, vomiting, bloating, diarrhea, constipation, hematemesis, melena, hematochezia, jaundice, hemorrhoids Genitourinary: Denies dysuria, frequency, urgency, nocturia, hesitancy, discharge, hematuria, flank pain Breast: Breast lumps, nipple discharge, bleeding.  Musculoskeletal: Denies arthralgia, myalgia, stiffness, Jt. Swelling, pain, limp, and strain/sprain. Denies falls. Skin: Denies puritis, rash, hives, warts, acne, eczema, changing in skin lesion Neuro: No weakness, tremor, incoordination, spasms, paresthesia, pain Psychiatric: Denies confusion, memory loss, sensory loss. Denies  Depression. Endocrine: Denies change in weight, skin, hair change, nocturia, and paresthesia, diabetic polys, visual blurring, hyper / hypo glycemic episodes.  Heme/Lymph: No excessive bleeding, bruising, enlarged lymph nodes.  Physical Exam  BP 136/82 mmHg  Pulse 88  Temp(Src) 97.9 F (36.6 C)  Resp 16  Ht 5\' 3"  (1.6 m)  Wt 204 lb 9.6 oz (92.806 kg)  BMI 36.25 kg/m2  General Appearance: Over nourished and in no apparent distress. Eyes: PERRLA, EOMs, conjunctiva no swelling or erythema, normal fundi and vessels. Sinuses: No frontal/maxillary tenderness ENT/Mouth: EACs patent / TMs  nl. Nares clear without erythema, swelling, mucoid exudates. Oral hygiene is good. No erythema, swelling, or exudate. Tongue normal, non-obstructing. Tonsils not swollen or erythematous. Hearing normal.  Neck: Supple, thyroid normal. No bruits, nodes or JVD. Respiratory: Respiratory effort normal.  BS equal and clear bilateral without rales, rhonci, wheezing or stridor. Cardio: Heart sounds are normal with regular rate and rhythm and no murmurs, rubs or gallops. Peripheral pulses are normal and equal bilaterally  without edema. No aortic or femoral bruits. Chest: symmetric with normal excursions and percussion. Breasts: Symmetric, without lumps, nipple discharge, retractions, or fibrocystic changes.  Abdomen: Flat, soft, with bowel sounds. Nontender, no guarding, rebound, hernias, masses, or organomegaly.  Lymphatics: Non tender without lymphadenopathy.  Musculoskeletal: Full ROM all peripheral extremities, joint stability, 5/5 strength, and normal gait. Skin: Warm and dry without rashes, lesions, cyanosis, clubbing or  ecchymosis.  Neuro: Cranial nerves intact, reflexes equal bilaterally. Normal muscle tone, no cerebellar symptoms. Sensation intact to touch , Vibratory & monofilament to the toes bilaterally.  Pysch: Alert and oriented X 3, normal affect, Insight and Judgment appropriate.   Assessment and  Plan  1. Annual Preventative Screening Examination   2. Essential hypertension  - EKG 12-Lead - [name], RETROPERITNL ABD,  LTD - TSH  3. Hyperlipidemia  - Lipid panel - TSH  4. Type 2 diabetes mellitus with stage 3 chronic kidney disease, without long-term current use of insulin (HCC)  - Microalbumin / creatinine urine ratio - [facility] DIABETES FOOT EXAM - LOW EXTREMITY NEUR EXAM DOCUM - Hemoglobin A1c - Insulin, random  5. Hypothyroidism  - VITAMIN D 25 Hydroxy   6. CKD (chronic kidney disease) stage 3, GFR 30-59 ml/min   7. Idiopathic gout  - Uric acid  8. Screening for rectal cancer  - Microalbumin / creatinine urine ratio - EKG 12-Lead - [name], RETROPERITNL ABD,  LTD - POC Hemoccult Bld/Stl  - Urinalysis, Routine w reflex microscopic  - Uric acid - [facility] DIABETES FOOT EXAM - LOW EXTREMITY NEUR EXAM DOCUM - CBC with Differential/Platelet - BASIC METABOLIC PANEL WITH GFR - Hepatic function panel - Magnesium - Lipid panel - TSH - Hemoglobin A1c - Insulin, random - VITAMIN D 25 Hydroxy   9. Medication management  - Urinalysis, Routine w reflex microscopic  - CBC with Differential/Platelet - BASIC METABOLIC PANEL WITH GFR - Hepatic function panel - Magnesium  10. Screening for ischemic heart disease   11. Screening for AAA (aortic abdominal aneurysm)   Continue prudent diet as discussed, weight control, BP monitoring, regular exercise, and medications. Discussed med's effects and SE's. Screening labs and tests as requested with regular follow-up as recommended. Over 40 minutes of exam, counseling, chart review and high complex critical decision making was performed.

## 2015-10-27 ENCOUNTER — Other Ambulatory Visit: Payer: Self-pay | Admitting: Internal Medicine

## 2015-10-27 DIAGNOSIS — E790 Hyperuricemia without signs of inflammatory arthritis and tophaceous disease: Secondary | ICD-10-CM

## 2015-10-27 DIAGNOSIS — N390 Urinary tract infection, site not specified: Secondary | ICD-10-CM

## 2015-10-27 LAB — URINALYSIS, MICROSCOPIC ONLY
Bacteria, UA: NONE SEEN [HPF]
CRYSTALS: NONE SEEN [HPF]
Casts: NONE SEEN [LPF]
Squamous Epithelial / LPF: NONE SEEN [HPF] (ref <=5)
Yeast: NONE SEEN [HPF]

## 2015-10-27 LAB — URINALYSIS, ROUTINE W REFLEX MICROSCOPIC
Bilirubin Urine: NEGATIVE
Glucose, UA: NEGATIVE
HGB URINE DIPSTICK: NEGATIVE
KETONES UR: NEGATIVE
NITRITE: NEGATIVE
PH: 6 (ref 5.0–8.0)
Specific Gravity, Urine: 1.011 (ref 1.001–1.035)

## 2015-10-27 LAB — INSULIN, RANDOM: Insulin: 8.3 u[IU]/mL (ref 2.0–19.6)

## 2015-10-27 LAB — MICROALBUMIN / CREATININE URINE RATIO
CREATININE, URINE: 48 mg/dL (ref 20–320)
MICROALB UR: 28.4 mg/dL
Microalb Creat Ratio: 592 mcg/mg creat — ABNORMAL HIGH (ref <30)

## 2015-10-27 LAB — VITAMIN D 25 HYDROXY (VIT D DEFICIENCY, FRACTURES): VIT D 25 HYDROXY: 88 ng/mL (ref 30–100)

## 2015-10-27 MED ORDER — NITROFURANTOIN MONOHYD MACRO 100 MG PO CAPS: ORAL_CAPSULE | ORAL | Status: AC

## 2015-10-27 MED ORDER — ALLOPURINOL 300 MG PO TABS: ORAL_TABLET | ORAL | Status: DC

## 2015-10-31 DIAGNOSIS — M6281 Muscle weakness (generalized): Secondary | ICD-10-CM | POA: Diagnosis not present

## 2015-10-31 DIAGNOSIS — M1711 Unilateral primary osteoarthritis, right knee: Secondary | ICD-10-CM | POA: Diagnosis not present

## 2015-11-09 ENCOUNTER — Telehealth: Payer: Self-pay | Admitting: *Deleted

## 2015-11-09 NOTE — Telephone Encounter (Signed)
Pt aware of lab results and an appointment was made for NV to recheck urine.

## 2015-12-20 ENCOUNTER — Other Ambulatory Visit: Payer: Self-pay | Admitting: Internal Medicine

## 2015-12-20 DIAGNOSIS — I10 Essential (primary) hypertension: Secondary | ICD-10-CM

## 2015-12-22 ENCOUNTER — Ambulatory Visit (INDEPENDENT_AMBULATORY_CARE_PROVIDER_SITE_OTHER): Payer: Medicare Other | Admitting: *Deleted

## 2015-12-22 ENCOUNTER — Other Ambulatory Visit: Payer: Self-pay | Admitting: *Deleted

## 2015-12-22 DIAGNOSIS — N3000 Acute cystitis without hematuria: Secondary | ICD-10-CM

## 2015-12-22 MED ORDER — MINOXIDIL 10 MG PO TABS: ORAL_TABLET | ORAL | Status: DC

## 2015-12-22 NOTE — Progress Notes (Signed)
Patient ID: Alyssa [name], female   DOB: 1946/02/28, 70 y.o.   MRN: [id number] Patient here for a NV to recheck a U/A and a urine culture.  Patient states she finished her Macrobid and also took OTC Cystex.  Patient states she is having diarrhea due to taking Pantoprazole.  She states she does not have a problem with indigestion and wants to stop the medication for now.  Per Dr Melford Aase, it is OK to stop and she was advised to call as needed.

## 2015-12-23 LAB — URINE CULTURE
COLONY COUNT: NO GROWTH
Organism ID, Bacteria: NO GROWTH

## 2015-12-23 LAB — URINALYSIS, COMPLETE
BACTERIA UA: NONE SEEN [HPF]
Bilirubin Urine: NEGATIVE
CRYSTALS: NONE SEEN [HPF]
Casts: NONE SEEN [LPF]
GLUCOSE, UA: NEGATIVE
Hgb urine dipstick: NEGATIVE
Ketones, ur: NEGATIVE
Nitrite: NEGATIVE
RBC / HPF: NONE SEEN RBC/HPF (ref <=2)
Specific Gravity, Urine: 1.011 (ref 1.001–1.035)
Squamous Epithelial / LPF: NONE SEEN [HPF] (ref <=5)
WBC, UA: NONE SEEN WBC/HPF (ref <=5)
YEAST: NONE SEEN [HPF]
pH: 6 (ref 5.0–8.0)

## 2015-12-26 ENCOUNTER — Other Ambulatory Visit: Payer: Self-pay | Admitting: Internal Medicine

## 2015-12-28 ENCOUNTER — Telehealth: Payer: Self-pay | Admitting: *Deleted

## 2015-12-28 NOTE — Telephone Encounter (Signed)
Pt aware of normal urine results

## 2015-12-29 ENCOUNTER — Encounter: Payer: Self-pay | Admitting: Physician Assistant

## 2015-12-29 ENCOUNTER — Other Ambulatory Visit: Payer: Self-pay | Admitting: Physician Assistant

## 2015-12-29 ENCOUNTER — Ambulatory Visit (INDEPENDENT_AMBULATORY_CARE_PROVIDER_SITE_OTHER): Payer: Medicare Other | Admitting: Physician Assistant

## 2015-12-29 VITALS — BP 134/66 | HR 73 | Temp 97.9°F | Resp 14 | Ht 63.0 in | Wt 221.0 lb

## 2015-12-29 DIAGNOSIS — N183 Chronic kidney disease, stage 3 unspecified: Secondary | ICD-10-CM

## 2015-12-29 DIAGNOSIS — E039 Hypothyroidism, unspecified: Secondary | ICD-10-CM

## 2015-12-29 DIAGNOSIS — A09 Infectious gastroenteritis and colitis, unspecified: Secondary | ICD-10-CM | POA: Diagnosis not present

## 2015-12-29 DIAGNOSIS — Z79899 Other long term (current) drug therapy: Secondary | ICD-10-CM

## 2015-12-29 DIAGNOSIS — D649 Anemia, unspecified: Secondary | ICD-10-CM | POA: Diagnosis not present

## 2015-12-29 NOTE — Progress Notes (Signed)
Subjective:    Patient ID: Alyssa [name], female    DOB: 11/22/1945, 70 y.o.   MRN: [id number]  HPI 70 y.o. WF with history of DM, HTN, bipolar, chol, hypothyroidism presents with diarrhea.  Patient also complains of diarrhea. Onset of diarrhea was 3-4 months ago. Diarrhea is occurring approximately 3-4 times per day. Patient describes diarrhea as loose diarrhea with occ skinny BM's. Diarrhea has been associated with no other symptoms. Patient denies blood in stool, fever, illness in household contacts, recent travel, significant abdominal pain, unintentional weight loss. Patient reports recent ABX use in may, and has had frequent doses due to UTIs. She was on protonix and prilosec at the same time but has stopped recently, she is now just on probiotics.  Last colonoscopy: 09/2013  Blood pressure 134/66, pulse 73, temperature 97.9 F (36.6 C), temperature source Temporal, resp. rate 14, height _0  (1.6 m), weight 221 lb (100.245 kg), SpO2 97 %.  Past Medical History  Diagnosis Date  . Osteoporosis   . Diabetes mellitus without complication ([place])   . Hypertension   . Bipolar 1 disorder ([place])   . Hyperlipidemia   . Anemia   . Cataract   . Glaucoma   . Chronic back pain   . Hypothyroidism    Current Outpatient Prescriptions on File Prior to Visit  Medication Sig Dispense Refill  . allopurinol (ZYLOPRIM) 300 MG tablet Take 1/2 to 1 tablet daily to prevent Gout 90 tablet 1  . aspirin 81 MG tablet Take 81 mg by mouth daily.    Marland Kitchen atorvastatin (LIPITOR) 80 MG tablet TAKE 1 TABLET(80 MG) BY MOUTH DAILY 90 tablet 1  . Blood Glucose Monitoring Suppl (ACCU-CHEK AVIVA PLUS) W/DEVICE KIT   0  . Cholecalciferol (VITAMIN D-3 PO) Take 5,000 Units by mouth every evening.     . Cyanocobalamin (VITAMIN B 12 PO) Take 1,000 mcg by mouth every other day.    . furosemide (LASIX) 40 MG tablet TAKE 1 TABLET BY MOUTH TWICE DAILY 180 tablet 1  . glipiZIDE (GLUCOTROL) 10 MG tablet TAKE 1 TABLET BY MOUTH  THREE TIMES DAILY 90 tablet 5  . glucose blood (ACCU-CHEK AVIVA PLUS) test strip Check fasting glucose each morning 100 each 1  . Lancets (FREESTYLE) lancets Use as instructed 100 each 12  . latanoprost (XALATAN) 0.005 % ophthalmic solution Place 1 drop into both eyes every evening.     Marland Kitchen levothyroxine (SYNTHROID, LEVOTHROID) 50 MCG tablet TAKE 1 TABLET BY MOUTH DAILY BEFORE BREKFAST 90 tablet 1  . losartan (COZAAR) 100 MG tablet TAKE 1 TABLET BY MOUTH DAILY 90 tablet 1  . MAGNESIUM-OXIDE 400 (241.3 Mg) MG tablet TAKE 1 TABLET BY MOUTH TWICE DAILY 180 tablet 3  . metFORMIN (GLUCOPHAGE-XR) 500 MG 24 hr tablet TAKE 2 TABLETS BY MOUTH TWICE DAILY FOR DIABETES 360 tablet 2  . minoxidil (LONITEN) 10 MG tablet TAKE 1/2 TO 1 TABLET BY MOUTH DAILY AS DIRECTED FOR BLOOD PRESSURE 90 tablet 0  . Multiple Vitamin (MULTIVITAMIN) tablet Take 1 tablet by mouth daily.    . Omega-3 Fatty Acids (FISH OIL) 1000 MG CAPS Take 1,000 mg by mouth every evening.     . phentermine (ADIPEX-P) 37.5 MG tablet Take 1 tablet (37.5 mg total) by mouth daily before breakfast. 30 tablet 0  . Probiotic Product (PROBIOTIC DAILY PO) Take 1 tablet by mouth every morning.     . sertraline (ZOLOFT) 100 MG tablet Take 1 tablet (100 mg total) by mouth daily.  30 tablet 2  . timolol (TIMOPTIC) 0.5 % ophthalmic solution INSTILL 1 DROP IN BOTH EYES TWICE DAILY 5 mL 0   No current facility-administered medications on file prior to visit.   Review of Systems  Constitutional: Negative for fever, chills, diaphoresis and fatigue.  HENT: Negative.   Respiratory: Negative.  Negative for cough.   Cardiovascular: Negative.   Gastrointestinal: Positive for diarrhea. Negative for nausea, vomiting, abdominal pain, constipation, blood in stool, abdominal distention, anal bleeding and rectal pain.  Genitourinary: Negative.   Musculoskeletal: Positive for back pain and arthralgias. Negative for myalgias, joint swelling, gait problem, neck pain and neck  stiffness.  Skin: Negative.   Neurological: Negative for dizziness and headaches.       Objective:   Physical Exam  Constitutional: She is oriented to person, place, and time. She appears well-developed and well-nourished. She does not have a sickly appearance. No distress.  HENT:  Nose: Right sinus exhibits no maxillary sinus tenderness and no frontal sinus tenderness. Left sinus exhibits no maxillary sinus tenderness and no frontal sinus tenderness.  Mouth/Throat: Uvula is midline and oropharynx is clear and moist. No oropharyngeal exudate.  Neck: Trachea normal, normal range of motion and phonation normal. Neck supple. No tracheal tenderness present. No tracheal deviation present.  Cardiovascular: Normal rate, regular rhythm and normal heart sounds.   No murmur heard. Pulmonary/Chest: Effort normal. No stridor. No respiratory distress. She has no decreased breath sounds. She has no wheezes. She has no rhonchi. She has no rales. She exhibits no tenderness.  Abdominal: Soft. Bowel sounds are normal. There is no tenderness (NONTENDER). There is no rebound and no guarding.  Obese abdomen  Neurological: She is alert and oriented to person, place, and time. She has normal strength. She displays no tremor. No cranial nerve deficit or sensory deficit. She exhibits normal muscle tone. Coordination and gait abnormal.  Gait is antalgic with a walker.   Skin: Skin is warm and dry. No rash noted. She is not diaphoretic.  Psychiatric: She has a normal mood and affect.  Vitals reviewed.      Assessment & Plan:  Diarrhea with benign abdomen exam-  Will check for Cdiff due to ABX use and double PPI use Stop metformin for now GI consult. Lab studies per orders. Stool studies per orders. Check labs. The 'BRAT' diet is suggested, then progress to diet as tolerated.  Call if bloody stools, persistent diarrhea, not voiding regularly, unable to take oral fluids, vomiting, high fever, severe weakness,  abdominal pain or failure to improve in 2-3 days.   Future Appointments Date Time Provider [place]  02/21/2016 10:30 AM Starlyn Skeans, PA-C [facility] None  05/14/2016 9:30 AM Unk Pinto, MD [facility] None  12/06/2016 10:00 AM Unk Pinto, MD [facility] None

## 2015-12-29 NOTE — Patient Instructions (Addendum)
Stop the Metformin for now Stop the magnesium for now To see if these help stop the diarrhea.  We are going to check stool to rule out something called Cdiff due to the recent antbiotic use Will check your labs Stay off of the protonix and prilosec.   Call if bloody stools, persistent diarrhea, not voiding regularly, unable to take oral fluids, vomiting, high fever, severe weakness, abdominal pain or failure to improve in 2-3 days.   Diarrhea Diarrhea is frequent loose and watery bowel movements. It can cause you to feel weak and dehydrated. Dehydration can cause you to become tired and thirsty, have a dry mouth, and have decreased urination that often is dark yellow. Diarrhea is a sign of another problem, most often an infection that will not last long. In most cases, diarrhea typically lasts 2-3 days. However, it can last longer if it is a sign of something more serious. It is important to treat your diarrhea as directed by your caregiver to lessen or prevent future episodes of diarrhea. CAUSES  Some common causes include:  Gastrointestinal infections caused by viruses, bacteria, or parasites.  Food poisoning or food allergies.  Certain medicines, such as antibiotics, chemotherapy, and laxatives.  Artificial sweeteners and fructose.  Digestive disorders. HOME CARE INSTRUCTIONS  Ensure adequate fluid intake (hydration): Have 1 cup (8 oz) of fluid for each diarrhea episode. Avoid fluids that contain simple sugars or sports drinks, fruit juices, whole milk products, and sodas. Your urine should be clear or pale yellow if you are drinking enough fluids. Hydrate with an oral rehydration solution that you can purchase at pharmacies, retail stores, and online. You can prepare an oral rehydration solution at home by mixing the following ingredients together:   - tsp table salt.   tsp baking soda.   tsp salt substitute containing potassium chloride.  1  tablespoons sugar.  1 L (34 oz) of  water.  Certain foods and beverages may increase the speed at which food moves through the gastrointestinal (GI) tract. These foods and beverages should be avoided and include:  Caffeinated and alcoholic beverages.  High-fiber foods, such as raw fruits and vegetables, nuts, seeds, and whole grain breads and cereals.  Foods and beverages sweetened with sugar alcohols, such as xylitol, sorbitol, and mannitol.  Some foods may be well tolerated and may help thicken stool including:  Starchy foods, such as rice, toast, pasta, low-sugar cereal, oatmeal, grits, baked potatoes, crackers, and bagels.  Bananas.  Applesauce.  Add probiotic-rich foods to help increase healthy bacteria in the GI tract, such as yogurt and fermented milk products.  Wash your hands well after each diarrhea episode.  Only take over-the-counter or prescription medicines as directed by your caregiver.  Take a warm bath to relieve any burning or pain from frequent diarrhea episodes. SEEK IMMEDIATE MEDICAL CARE IF:   You are unable to keep fluids down.  You have persistent vomiting.  You have blood in your stool, or your stools are black and tarry.  You do not urinate in 6-8 hours, or there is only a small amount of very dark urine.  You have abdominal pain that increases or localizes.  You have weakness, dizziness, confusion, or light-headedness.  You have a severe headache.  Your diarrhea gets worse or does not get better.  You have a fever or persistent symptoms for more than 2-3 days.  You have a fever and your symptoms suddenly get worse. MAKE SURE YOU:   Understand these instructions.  Will watch your condition.  Will get help right away if you are not doing well or get worse.   This information is not intended to replace advice given to you by your health care provider. Make sure you discuss any questions you have with your health care provider.   Document Released: 06/29/2002 Document  Revised: 07/30/2014 Document Reviewed: 03/16/2012 Elsevier Interactive Patient Education [employer].

## 2015-12-30 LAB — CBC WITH DIFFERENTIAL/PLATELET
BASOS ABS: 55 {cells}/uL (ref 0–200)
Basophils Relative: 1 %
EOS PCT: 3 %
Eosinophils Absolute: 165 cells/uL (ref 15–500)
HEMATOCRIT: 32.9 % — AB (ref 35.0–45.0)
Hemoglobin: 10.5 g/dL — ABNORMAL LOW (ref 11.7–15.5)
Lymphocytes Relative: 32 %
Lymphs Abs: 1760 cells/uL (ref 850–3900)
MCH: 29.2 pg (ref 27.0–33.0)
MCHC: 31.9 g/dL — AB (ref 32.0–36.0)
MCV: 91.4 fL (ref 80.0–100.0)
MPV: 10.7 fL (ref 7.5–12.5)
Monocytes Absolute: 220 cells/uL (ref 200–950)
Monocytes Relative: 4 %
NEUTROS PCT: 60 %
Neutro Abs: 3300 cells/uL (ref 1500–7800)
Platelets: 256 10*3/uL (ref 140–400)
RBC: 3.6 MIL/uL — AB (ref 3.80–5.10)
RDW: 15.1 % — AB (ref 11.0–15.0)
WBC: 5.5 10*3/uL (ref 3.8–10.8)

## 2015-12-30 LAB — IRON AND TIBC
%SAT: 15 % (ref 11–50)
IRON: 41 ug/dL — AB (ref 45–160)
TIBC: 278 ug/dL (ref 250–450)
UIBC: 237 ug/dL (ref 125–400)

## 2015-12-30 LAB — BASIC METABOLIC PANEL WITH GFR
BUN: 40 mg/dL — ABNORMAL HIGH (ref 7–25)
CALCIUM: 10 mg/dL (ref 8.6–10.4)
CO2: 31 mmol/L (ref 20–31)
CREATININE: 1.8 mg/dL — AB (ref 0.60–0.93)
Chloride: 95 mmol/L — ABNORMAL LOW (ref 98–110)
GFR, EST AFRICAN AMERICAN: 32 mL/min — AB (ref >=60)
GFR, EST NON AFRICAN AMERICAN: 28 mL/min — AB (ref >=60)
GLUCOSE: 164 mg/dL — AB (ref 65–99)
Potassium: 4.1 mmol/L (ref 3.5–5.3)
SODIUM: 141 mmol/L (ref 135–146)

## 2015-12-30 LAB — HEPATIC FUNCTION PANEL
ALBUMIN: 3.9 g/dL (ref 3.6–5.1)
ALT: 11 U/L (ref 6–29)
AST: 16 U/L (ref 10–35)
Alkaline Phosphatase: 49 U/L (ref 33–130)
BILIRUBIN INDIRECT: 0.3 mg/dL (ref 0.2–1.2)
Bilirubin, Direct: 0.1 mg/dL (ref <=0.2)
TOTAL PROTEIN: 6.6 g/dL (ref 6.1–8.1)
Total Bilirubin: 0.4 mg/dL (ref 0.2–1.2)

## 2015-12-30 LAB — TSH: TSH: 2.38 mIU/L

## 2015-12-30 LAB — MAGNESIUM: MAGNESIUM: 2.4 mg/dL (ref 1.5–2.5)

## 2015-12-30 LAB — FERRITIN: Ferritin: 117 ng/mL (ref 20–288)

## 2016-01-02 DIAGNOSIS — M1711 Unilateral primary osteoarthritis, right knee: Secondary | ICD-10-CM | POA: Diagnosis not present

## 2016-01-02 DIAGNOSIS — M6281 Muscle weakness (generalized): Secondary | ICD-10-CM | POA: Diagnosis not present

## 2016-01-05 ENCOUNTER — Ambulatory Visit (INDEPENDENT_AMBULATORY_CARE_PROVIDER_SITE_OTHER): Payer: Medicare Other | Admitting: *Deleted

## 2016-01-05 DIAGNOSIS — Z79899 Other long term (current) drug therapy: Secondary | ICD-10-CM

## 2016-01-05 DIAGNOSIS — D649 Anemia, unspecified: Secondary | ICD-10-CM

## 2016-01-05 DIAGNOSIS — A09 Infectious gastroenteritis and colitis, unspecified: Secondary | ICD-10-CM

## 2016-01-05 LAB — CBC WITH DIFFERENTIAL/PLATELET
Basophils Absolute: 62 cells/uL (ref 0–200)
Basophils Relative: 1 %
EOS ABS: 186 {cells}/uL (ref 15–500)
Eosinophils Relative: 3 %
HEMATOCRIT: 33.8 % — AB (ref 35.0–45.0)
Hemoglobin: 10.8 g/dL — ABNORMAL LOW (ref 11.7–15.5)
LYMPHS PCT: 37 %
Lymphs Abs: 2294 cells/uL (ref 850–3900)
MCH: 29.1 pg (ref 27.0–33.0)
MCHC: 32 g/dL (ref 32.0–36.0)
MCV: 91.1 fL (ref 80.0–100.0)
MONO ABS: 248 {cells}/uL (ref 200–950)
MPV: 10.2 fL (ref 7.5–12.5)
Monocytes Relative: 4 %
NEUTROS PCT: 55 %
Neutro Abs: 3410 cells/uL (ref 1500–7800)
Platelets: 249 10*3/uL (ref 140–400)
RBC: 3.71 MIL/uL — ABNORMAL LOW (ref 3.80–5.10)
RDW: 14.6 % (ref 11.0–15.0)
WBC: 6.2 10*3/uL (ref 3.8–10.8)

## 2016-01-05 NOTE — Progress Notes (Signed)
Patient ID: Alyssa [name], female   DOB: 22-Oct-1945, 70 y.o.   MRN: [id number] Patient presents for recheck CBC, BMP and Iron TIBC, per Vicie Mutters, PA-C orders.  Patient states she increased her iron consumption through diet and d/c her PPI.

## 2016-01-06 LAB — GASTROINTESTINAL PATHOGEN PANEL PCR
C. DIFFICILE TOX A/B, PCR: NOT DETECTED
CAMPYLOBACTER, PCR: NOT DETECTED
Cryptosporidium, PCR: NOT DETECTED
E COLI 0157, PCR: NOT DETECTED
E coli (ETEC) LT/ST PCR: NOT DETECTED
E coli (STEC) stx1/stx2, PCR: NOT DETECTED
GIARDIA LAMBLIA, PCR: NOT DETECTED
Norovirus, PCR: NOT DETECTED
ROTAVIRUS, PCR: NOT DETECTED
Salmonella, PCR: NOT DETECTED
Shigella, PCR: NOT DETECTED

## 2016-01-06 LAB — BASIC METABOLIC PANEL WITH GFR
BUN: 51 mg/dL — AB (ref 7–25)
CALCIUM: 10.1 mg/dL (ref 8.6–10.4)
CHLORIDE: 103 mmol/L (ref 98–110)
CO2: 28 mmol/L (ref 20–31)
CREATININE: 1.7 mg/dL — AB (ref 0.60–0.93)
GFR, Est African American: 34 mL/min — ABNORMAL LOW (ref >=60)
GFR, Est Non African American: 30 mL/min — ABNORMAL LOW (ref >=60)
Glucose, Bld: 124 mg/dL — ABNORMAL HIGH (ref 65–99)
Potassium: 4.6 mmol/L (ref 3.5–5.3)
Sodium: 142 mmol/L (ref 135–146)

## 2016-01-06 LAB — IRON AND TIBC
%SAT: 19 % (ref 11–50)
Iron: 54 ug/dL (ref 45–160)
TIBC: 289 ug/dL (ref 250–450)
UIBC: 235 ug/dL (ref 125–400)

## 2016-01-14 ENCOUNTER — Other Ambulatory Visit: Payer: Self-pay | Admitting: Internal Medicine

## 2016-01-18 ENCOUNTER — Other Ambulatory Visit: Payer: Self-pay | Admitting: Physician Assistant

## 2016-01-18 DIAGNOSIS — R569 Unspecified convulsions: Secondary | ICD-10-CM

## 2016-01-28 ENCOUNTER — Other Ambulatory Visit: Payer: Self-pay | Admitting: Physician Assistant

## 2016-02-08 ENCOUNTER — Ambulatory Visit: Payer: Self-pay | Admitting: Internal Medicine

## 2016-02-21 ENCOUNTER — Encounter: Payer: Self-pay | Admitting: Internal Medicine

## 2016-02-21 ENCOUNTER — Ambulatory Visit (INDEPENDENT_AMBULATORY_CARE_PROVIDER_SITE_OTHER): Payer: Medicare Other | Admitting: Internal Medicine

## 2016-02-21 VITALS — BP 132/74 | HR 81 | Temp 97.7°F | Resp 16 | Ht 63.0 in | Wt 227.2 lb

## 2016-02-21 DIAGNOSIS — Z0000 Encounter for general adult medical examination without abnormal findings: Secondary | ICD-10-CM

## 2016-02-21 DIAGNOSIS — R6889 Other general symptoms and signs: Secondary | ICD-10-CM

## 2016-02-21 DIAGNOSIS — Z0001 Encounter for general adult medical examination with abnormal findings: Secondary | ICD-10-CM

## 2016-02-21 DIAGNOSIS — E119 Type 2 diabetes mellitus without complications: Secondary | ICD-10-CM

## 2016-02-21 DIAGNOSIS — I5031 Acute diastolic (congestive) heart failure: Secondary | ICD-10-CM | POA: Diagnosis not present

## 2016-02-21 DIAGNOSIS — E039 Hypothyroidism, unspecified: Secondary | ICD-10-CM | POA: Diagnosis not present

## 2016-02-21 DIAGNOSIS — M1000 Idiopathic gout, unspecified site: Secondary | ICD-10-CM

## 2016-02-21 DIAGNOSIS — F32A Depression, unspecified: Secondary | ICD-10-CM

## 2016-02-21 DIAGNOSIS — F319 Bipolar disorder, unspecified: Secondary | ICD-10-CM

## 2016-02-21 DIAGNOSIS — N183 Chronic kidney disease, stage 3 unspecified: Secondary | ICD-10-CM

## 2016-02-21 DIAGNOSIS — I10 Essential (primary) hypertension: Secondary | ICD-10-CM

## 2016-02-21 DIAGNOSIS — M810 Age-related osteoporosis without current pathological fracture: Secondary | ICD-10-CM

## 2016-02-21 DIAGNOSIS — Z79899 Other long term (current) drug therapy: Secondary | ICD-10-CM

## 2016-02-21 DIAGNOSIS — E559 Vitamin D deficiency, unspecified: Secondary | ICD-10-CM

## 2016-02-21 DIAGNOSIS — H16132 Photokeratitis, left eye: Secondary | ICD-10-CM

## 2016-02-21 DIAGNOSIS — E1122 Type 2 diabetes mellitus with diabetic chronic kidney disease: Secondary | ICD-10-CM

## 2016-02-21 DIAGNOSIS — F329 Major depressive disorder, single episode, unspecified: Secondary | ICD-10-CM

## 2016-02-21 DIAGNOSIS — E785 Hyperlipidemia, unspecified: Secondary | ICD-10-CM

## 2016-02-21 LAB — HEPATIC FUNCTION PANEL
ALBUMIN: 4.1 g/dL (ref 3.6–5.1)
ALK PHOS: 49 U/L (ref 33–130)
ALT: 11 U/L (ref 6–29)
AST: 18 U/L (ref 10–35)
BILIRUBIN TOTAL: 0.5 mg/dL (ref 0.2–1.2)
Bilirubin, Direct: 0.1 mg/dL (ref <=0.2)
Indirect Bilirubin: 0.4 mg/dL (ref 0.2–1.2)
TOTAL PROTEIN: 6.6 g/dL (ref 6.1–8.1)

## 2016-02-21 LAB — CBC WITH DIFFERENTIAL/PLATELET
BASOS PCT: 0 %
Basophils Absolute: 0 cells/uL (ref 0–200)
EOS ABS: 174 {cells}/uL (ref 15–500)
Eosinophils Relative: 3 %
HEMATOCRIT: 32.9 % — AB (ref 35.0–45.0)
Hemoglobin: 10.4 g/dL — ABNORMAL LOW (ref 11.7–15.5)
Lymphocytes Relative: 26 %
Lymphs Abs: 1508 cells/uL (ref 850–3900)
MCH: 28.5 pg (ref 27.0–33.0)
MCHC: 31.6 g/dL — ABNORMAL LOW (ref 32.0–36.0)
MCV: 90.1 fL (ref 80.0–100.0)
MONO ABS: 232 {cells}/uL (ref 200–950)
MPV: 11.2 fL (ref 7.5–12.5)
Monocytes Relative: 4 %
NEUTROS ABS: 3886 {cells}/uL (ref 1500–7800)
Neutrophils Relative %: 67 %
Platelets: 252 10*3/uL (ref 140–400)
RBC: 3.65 MIL/uL — ABNORMAL LOW (ref 3.80–5.10)
RDW: 14.7 % (ref 11.0–15.0)
WBC: 5.8 10*3/uL (ref 3.8–10.8)

## 2016-02-21 LAB — BASIC METABOLIC PANEL WITH GFR
BUN: 46 mg/dL — AB (ref 7–25)
CHLORIDE: 101 mmol/L (ref 98–110)
CO2: 29 mmol/L (ref 20–31)
Calcium: 10.3 mg/dL (ref 8.6–10.4)
Creat: 2.08 mg/dL — ABNORMAL HIGH (ref 0.60–0.93)
GFR, Est African American: 27 mL/min — ABNORMAL LOW (ref >=60)
GFR, Est Non African American: 23 mL/min — ABNORMAL LOW (ref >=60)
GLUCOSE: 83 mg/dL (ref 65–99)
POTASSIUM: 4.3 mmol/L (ref 3.5–5.3)
Sodium: 143 mmol/L (ref 135–146)

## 2016-02-21 LAB — LIPID PANEL
CHOL/HDL RATIO: 2.2 ratio (ref <=5.0)
Cholesterol: 137 mg/dL (ref 125–200)
HDL: 63 mg/dL (ref >=46)
LDL CALC: 56 mg/dL (ref <130)
Triglycerides: 90 mg/dL (ref <150)
VLDL: 18 mg/dL (ref <30)

## 2016-02-21 LAB — HEMOGLOBIN A1C
HEMOGLOBIN A1C: 7.8 % — AB (ref <5.7)
MEAN PLASMA GLUCOSE: 177 mg/dL

## 2016-02-21 LAB — TSH: TSH: 3.56 mIU/L

## 2016-02-21 NOTE — Patient Instructions (Signed)
Please take 1/2 tablet of immodium daily to help with diarrhea when you are going to be going out.  Do not take on days when you will be home.  You will get a call from [place] about getting set up for home physical therapy.  Please take claritin or zyrtec to help with keeping your ears dry and preventing dizziness.    Please use heat on your let to help with the bruising and pain.  Please stop flax seed and aloe vera juice.  Please stop allopurinol.  If you have a gout flare please let me know.

## 2016-02-21 NOTE — Progress Notes (Signed)
MEDICARE ANNUAL WELLNESS VISIT AND FOLLOW UP  Assessment:    1. Essential hypertension -cont meds -well controlled -dash diet -monitor at home - TSH  2. Acute diastolic congestive heart failure (HCC) -cont lasix and ARB -asa daily -followed by cards  3. Type 2 diabetes mellitus with stage 3 chronic kidney disease, without long-term current use of insulin (HCC) -cont diet and exercise -cont meds -A1C level  4. Hypothyroidism, unspecified hypothyroidism type -cont levothyroxine - TSH  5. T2_NIDDM -cont diet and exercise -cont glipizide - Hemoglobin A1c  6. CKD (chronic kidney disease) stage 3, GFR 30-59 ml/min -followed by Dr. Joelyn Oms  7. Osteoporosis -cont calcium and vitamin D supplement  8. Bipolar 1 disorder (HCC) -cont meds  9. Hyperlipidemia -cont meds -well controlled and at goal - Lipid panel  10. Vitamin D deficiency -cont supplement  11. Morbid obesity, unspecified obesity type ([employer]) -cont diet and exercise  12. Depression, controlled -cont meds -currently well controlled but does vacillate  13. Idiopathic gout, unspecified chronicity, unspecified site -stop allopurinol as has never had a flare and is not interested in continuing medication  14. Medication management  - CBC with Differential/Platelet - BASIC METABOLIC PANEL WITH GFR - Hepatic function panel  15. Encounter for Medicare annual wellness exam -due next year  20. Actinic keratitis, left -followed by Derm    Over 30 minutes of exam, counseling, chart review, and critical decision making was performed  Future Appointments Date Time Provider [place]  05/14/2016 9:30 AM Unk Pinto, MD [facility] None  12/06/2016 10:00 AM Unk Pinto, MD [facility] None    Plan:   During the course of the visit the patient was educated and counseled about appropriate screening and preventive services including:    Pneumococcal vaccine   Influenza vaccine  Td  vaccine  Prevnar 13  Screening electrocardiogram  Screening mammography  Bone densitometry screening  Colorectal cancer screening  Diabetes screening  Glaucoma screening  Nutrition counseling   Advanced directives: given info/requested copies   Subjective:   Alyssa [name] is a 70 y.o. female who presents for Medicare Annual Wellness Visit and 3 month follow up on hypertension, diabetes, hyperlipidemia, vitamin D def.   Her blood pressure has been controlled at home, today their BP is BP: 132/74 She does workout. She denies chest pain, shortness of breath, dizziness.  She is on cholesterol medication and denies myalgias. Her cholesterol is at goal. The cholesterol last visit was:  Lab Results  Component Value Date   CHOL 148 10/26/2015   HDL 42 (L) 10/26/2015   LDLCALC 76 10/26/2015   TRIG 151 (H) 10/26/2015   CHOLHDL 3.5 10/26/2015   Patient reports that she has been taking the glipizide.  She reports that she is checking her blood sugars are doing okay.  She aims to get 150.  She reports that she takes the glipizide if her sugars are running 200 or over.    Lab Results  Component Value Date   HGBA1C 7.9 (H) 10/26/2015   Last GFR Lab Results  Component Value Date   GFRNONAA 30 (L) 01/05/2016   Lab Results  Component Value Date   GFRAA 34 (L) 01/05/2016   Patient is on Vitamin D supplement. Lab Results  Component Value Date   VD25OH 68 10/26/2015     Patient reports that she fell 10 days ago trying to get in to the bed.  She reports that she scraped her left leg and she bruised her right arm.  She reports that both are getting better.  She has not used anything on them.    She reports that she is not feeling very stable.  She reports that she feels very unsteady on her feet.  She reports that she has to grab things.  She does feel stable when she uses her walker.  She feels like she cannot use her walker when she goes out because it is inconvenient.    She  reports that she is still having some issues with gas and diarrhea.  She reports that she does sometimes have some bowel incontinence.  She reports that the stools is poorly formed, but not watery.  She reports that it is hard for her to go out.  She reports that her bowels are not on a schedule.    Medication Review Current Outpatient Prescriptions on File Prior to Visit  Medication Sig Dispense Refill  . allopurinol (ZYLOPRIM) 300 MG tablet Take 1/2 to 1 tablet daily to prevent Gout 90 tablet 1  . aspirin 81 MG tablet Take 81 mg by mouth daily.    Marland Kitchen atorvastatin (LIPITOR) 80 MG tablet TAKE 1 TABLET(80 MG) BY MOUTH DAILY 90 tablet 1  . Blood Glucose Monitoring Suppl (ACCU-CHEK AVIVA PLUS) W/DEVICE KIT   0  . Cholecalciferol (VITAMIN D-3 PO) Take 5,000 Units by mouth every evening.     . Cyanocobalamin (VITAMIN B 12 PO) Take 1,000 mcg by mouth every other day.    . furosemide (LASIX) 40 MG tablet TAKE 1 TABLET BY MOUTH TWICE DAILY 180 tablet 1  . gabapentin (NEURONTIN) 800 MG tablet TAKE 1 TABLET(800 MG) BY MOUTH THREE TIMES DAILY 270 tablet 5  . glipiZIDE (GLUCOTROL) 10 MG tablet TAKE 1 TABLET BY MOUTH THREE TIMES DAILY 90 tablet 5  . glucose blood (ACCU-CHEK AVIVA PLUS) test strip Check fasting glucose each morning 100 each 1  . Lancets (FREESTYLE) lancets Use as instructed 100 each 12  . latanoprost (XALATAN) 0.005 % ophthalmic solution Place 1 drop into both eyes every evening.     Marland Kitchen levothyroxine (SYNTHROID, LEVOTHROID) 50 MCG tablet TAKE 1 TABLET BY MOUTH DAILY BEFORE BREAKFAST 90 tablet 0  . losartan (COZAAR) 100 MG tablet TAKE 1 TABLET BY MOUTH DAILY 90 tablet 1  . minoxidil (LONITEN) 10 MG tablet TAKE 1/2 TO 1 TABLET BY MOUTH DAILY AS DIRECTED FOR BLOOD PRESSURE 90 tablet 0  . Multiple Vitamin (MULTIVITAMIN) tablet Take 1 tablet by mouth daily.    . Omega-3 Fatty Acids (FISH OIL) 1000 MG CAPS Take 1,000 mg by mouth every evening.     . phentermine (ADIPEX-P) 37.5 MG tablet Take 1  tablet (37.5 mg total) by mouth daily before breakfast. 30 tablet 0  . sertraline (ZOLOFT) 100 MG tablet TAKE 1 TABLET(100 MG) BY MOUTH DAILY 90 tablet 1  . timolol (TIMOPTIC) 0.5 % ophthalmic solution INSTILL 1 DROP IN BOTH EYES TWICE DAILY 5 mL 0   No current facility-administered medications on file prior to visit.     Allergies: Allergies  Allergen Reactions  . Ciprofloxacin Hcl Diarrhea  . Cymbalta [Duloxetine Hcl] Other (See Comments)    Per pt: it did not work  . Xanax [Alprazolam] Other (See Comments)    Excessive sleepiness  . Codeine Other (See Comments)    Per pt: unknown  . Lortab [Hydrocodone-Acetaminophen] Other (See Comments)    Per pt: unknown  . Oxycodone Other (See Comments)    Per pt: unknown    Current Problems (verified)  has Osteoporosis; T2_NIDDM w/CKD 3 ; Hypertension; Bipolar 1 disorder ([place]); Hyperlipidemia; Vitamin D deficiency; Morbid obesity (BMI 39.21) ; Depression, controlled; Acute diastolic congestive heart failure (HCC); CKD (chronic kidney disease) stage 3, GFR 30-59 ml/min; Gout; Medication management; Hypothyroidism; Encounter for Medicare annual wellness exam; Actinic keratitis; and T2_NIDDM on her problem list.  Screening Tests Immunization History  Administered Date(s) Administered  . DT 05/18/2014  . Influenza Split 04/22/2013  . Influenza, High Dose Seasonal PF 04/01/2014  . Pneumococcal Conjugate-13 09/02/2014  . Pneumococcal Polysaccharide-23 07/23/2001, 07/23/2010  . Td 07/23/2002  . Zoster 07/23/2006    Preventative care: Last colonoscopy: 2015 Last mammogram: 2015   DEXA:2015  Prior vaccinations: TD or Tdap: 2015  Influenza: 2014  Pneumococcal: 2012 Prevnar13: 2016 Shingles/Zostavax: 2008  Names of Other Physician/Practitioners you currently use: 1. [facility]- here for primary care 2. Dr. Ellie Lunch, eye doctor, last visit /17 3. Not seeing one, dentist, last visit  Patient Care  Team: Unk Pinto, MD as PCP - General (Internal Medicine) Lafayette Dragon, MD as Consulting Physician (Gastroenterology) Marcial Pacas, MD as Consulting Physician (Neurology) Estevan Ryder, MD as Referring Physician (Cardiology) Luberta Mutter, MD as Consulting Physician (Ophthalmology) Jari Pigg, MD as Consulting Physician (Dermatology)  Surgical: She  has a past surgical history that includes Back surgery (2007); Knee arthroscopy (Bilateral); Dilation and curettage of uterus; Breast surgery (Left); Lumbar fusion (2009); Eye surgery (Left, July 2015); and Eye surgery (Right, July 2016). Family Her family history is not on file. She was adopted. Social history  She reports that she has never smoked. She has never used smokeless tobacco. She reports that she drinks alcohol. She reports that she does not use drugs.  MEDICARE WELLNESS OBJECTIVES: Physical activity:   Cardiac risk factors:   Depression/mood screen:   Depression screen [facility] 2/9 10/26/2015  Decreased Interest 0  Down, Depressed, Hopeless 0  PHQ - 2 Score 0  Altered sleeping -  Tired, decreased energy -  Change in appetite -  Feeling bad or failure about yourself  -  Trouble concentrating -  Moving slowly or fidgety/restless -  Suicidal thoughts -  PHQ-9 Score -    ADLs:  In your present state of health, do you have any difficulty performing the following activities: 10/26/2015  Hearing? N  Vision? N  Difficulty concentrating or making decisions? N  Walking or climbing stairs? N  Dressing or bathing? N  Doing errands, shopping? N  Some recent data might be hidden     Cognitive Testing  Alert? Yes  Normal Appearance?Yes  Oriented to person? Yes  Place? Yes   Time? Yes  Recall of three objects?  Yes  Can perform simple calculations? Yes  Displays appropriate judgment?Yes  Can read the correct time from a watch face?Yes  EOL planning:     Objective:   Today's Vitals   02/21/16 1030  BP: 132/74  Pulse: 81   Resp: 16  Temp: 97.7 F (36.5 C)  TempSrc: Temporal  SpO2: 96%  Weight: 227 lb 3.2 oz (103.1 kg)  Height: '5\' 3"'$  (1.6 m)   Body mass index is 40.25 kg/m.  General appearance: alert, no distress, WD/WN,  female HEENT: normocephalic, sclerae anicteric, TMs pearly, nares patent, no discharge or erythema, pharynx normal Oral cavity: MMM, no lesions Neck: supple, no lymphadenopathy, no thyromegaly, no masses Heart: RRR, normal S1, S2, no murmurs Lungs: CTA bilaterally, no wheezes, rhonchi, or rales Abdomen: +bs, soft, non tender, non distended,  no masses, no hepatomegaly, no splenomegaly Musculoskeletal: nontender, no swelling, no obvious deformity Extremities: no edema, no cyanosis, no clubbing Pulses: 2+ symmetric, upper and lower extremities, normal cap refill Neurological: alert, oriented x 3, CN2-12 intact, strength normal upper extremities and lower extremities, sensation normal throughout, DTRs 2+ throughout, no cerebellar signs, gait normal Psychiatric: normal affect, behavior normal, pleasant  Breast: defer Gyn: defer Rectal: defer   Medicare Attestation I have personally reviewed: The patient's medical and social history Their use of alcohol, tobacco or illicit drugs Their current medications and supplements The patient's functional ability including ADLs,fall risks, home safety risks, cognitive, and hearing and visual impairment Diet and physical activities Evidence for depression or mood disorders  The patient's weight, height, BMI, and visual acuity have been recorded in the chart.  I have made referrals, counseling, and provided education to the patient based on review of the above and I have provided the patient with a written personalized care plan for preventive services.     Starlyn Skeans, PA-C   02/21/2016

## 2016-02-28 ENCOUNTER — Other Ambulatory Visit: Payer: Self-pay | Admitting: Physician Assistant

## 2016-04-05 ENCOUNTER — Other Ambulatory Visit: Payer: Self-pay | Admitting: Internal Medicine

## 2016-04-18 DIAGNOSIS — M1711 Unilateral primary osteoarthritis, right knee: Secondary | ICD-10-CM | POA: Diagnosis not present

## 2016-04-19 ENCOUNTER — Other Ambulatory Visit: Payer: Self-pay | Admitting: Internal Medicine

## 2016-04-19 DIAGNOSIS — Z01419 Encounter for gynecological examination (general) (routine) without abnormal findings: Secondary | ICD-10-CM | POA: Diagnosis not present

## 2016-04-19 DIAGNOSIS — Z1231 Encounter for screening mammogram for malignant neoplasm of breast: Secondary | ICD-10-CM | POA: Diagnosis not present

## 2016-04-30 DIAGNOSIS — H401122 Primary open-angle glaucoma, left eye, moderate stage: Secondary | ICD-10-CM | POA: Diagnosis not present

## 2016-04-30 DIAGNOSIS — H401111 Primary open-angle glaucoma, right eye, mild stage: Secondary | ICD-10-CM | POA: Diagnosis not present

## 2016-05-07 ENCOUNTER — Other Ambulatory Visit: Payer: Self-pay | Admitting: Internal Medicine

## 2016-05-14 ENCOUNTER — Ambulatory Visit: Payer: Self-pay | Admitting: Internal Medicine

## 2016-06-05 ENCOUNTER — Other Ambulatory Visit: Payer: Self-pay | Admitting: Internal Medicine

## 2016-06-06 ENCOUNTER — Ambulatory Visit: Payer: Self-pay | Admitting: Internal Medicine

## 2016-06-19 ENCOUNTER — Ambulatory Visit (INDEPENDENT_AMBULATORY_CARE_PROVIDER_SITE_OTHER): Payer: Medicare Other | Admitting: Internal Medicine

## 2016-06-19 ENCOUNTER — Encounter: Payer: Self-pay | Admitting: Internal Medicine

## 2016-06-19 VITALS — BP 164/100 | HR 80 | Temp 97.5°F | Resp 16 | Ht 63.0 in | Wt 235.0 lb

## 2016-06-19 DIAGNOSIS — Z79899 Other long term (current) drug therapy: Secondary | ICD-10-CM

## 2016-06-19 DIAGNOSIS — N184 Chronic kidney disease, stage 4 (severe): Secondary | ICD-10-CM

## 2016-06-19 DIAGNOSIS — E790 Hyperuricemia without signs of inflammatory arthritis and tophaceous disease: Secondary | ICD-10-CM

## 2016-06-19 DIAGNOSIS — E1129 Type 2 diabetes mellitus with other diabetic kidney complication: Secondary | ICD-10-CM

## 2016-06-19 DIAGNOSIS — E039 Hypothyroidism, unspecified: Secondary | ICD-10-CM

## 2016-06-19 DIAGNOSIS — E559 Vitamin D deficiency, unspecified: Secondary | ICD-10-CM | POA: Diagnosis not present

## 2016-06-19 DIAGNOSIS — E782 Mixed hyperlipidemia: Secondary | ICD-10-CM

## 2016-06-19 DIAGNOSIS — Z91199 Patient's noncompliance with other medical treatment and regimen due to unspecified reason: Secondary | ICD-10-CM

## 2016-06-19 DIAGNOSIS — Z9119 Patient's noncompliance with other medical treatment and regimen: Secondary | ICD-10-CM

## 2016-06-19 DIAGNOSIS — I10 Essential (primary) hypertension: Secondary | ICD-10-CM | POA: Diagnosis not present

## 2016-06-19 NOTE — Progress Notes (Signed)
Subjective:    Patient ID: Alyssa [name], female    DOB: 30-Aug-1945, 70 y.o.   MRN: [id number]  HPI  Patient is a 70 yo recently widowed WF who is a poorly compliant Morbidly Obese T2 _DM with a hx/o bipolar disorder, depression and characterological disorder who presents today very upset relating difficulty dealing with the loss of her husband and her daughter in attendance relates her mother has been very bitter and difficult to deal with. Patient has several c/o about the coordination of care in this this office and she also admits that she is very poorly dietary compliant. She does admit difficulty with diarrhea on her Metformin and Magnesium supplements and denies ever being informed that she has any issues with Diabetic Kidney Disease despite the fact that that she was referred to a Nephrologist (Dr Joelyn Oms) for consultation. She accepts no responsibility for her poor diabetic control as she freely admits her glutinous overeating.   Medication Sig  . aspirin 81 MG tablet Take 81 mg by mouth daily.  Marland Kitchen atorvastatin (LIPITOR) 80 MG tablet TAKE 1 TABLET(80 MG) BY MOUTH DAILY  . Cholecalciferol (VITAMIN D-3 PO) Take 5,000 Units by mouth every evening.   . Cyanocobalamin (VITAMIN B 12 PO) Take 1,000 mcg by mouth every other day.  . furosemide (LASIX) 40 MG tablet TAKE 1 TABLET BY MOUTH TWICE DAILY  . gabapentin (NEURONTIN) 800 MG tablet TAKE 1 TABLET(800 MG) BY MOUTH THREE TIMES DAILY  . glipiZIDE (GLUCOTROL) 10 MG tablet TAKE 1 TABLET BY MOUTH THREE TIMES DAILY  . glucose blood (ACCU-CHEK AVIVA PLUS) test strip Check fasting glucose each morning  . Lancets (FREESTYLE) lancets Use as instructed  . latanoprost (XALATAN) 0.005 % ophthalmic solution Place 1 drop into both eyes every evening.   Marland Kitchen levothyroxine (SYNTHROID, LEVOTHROID) 50 MCG tablet TAKE 1 TABLET BY MOUTH DAILY BEFORE BREAKFAST  . losartan (COZAAR) 100 MG tablet TAKE 1 TABLET BY MOUTH DAILY  . minoxidil (LONITEN) 10 MG tablet TAKE  1/2 TO 1 TABLET BY MOUTH DAILY AS DIRECTED FOR BLOOD PRESSURE  . Multiple Vitamin (MULTIVITAMIN) tablet Take 1 tablet by mouth daily.  . Omega-3 Fatty Acids (FISH OIL) 1000 MG CAPS Take 1,000 mg by mouth every evening.   . phentermine (ADIPEX-P) 37.5 MG tablet Take 1 tablet (37.5 mg total) by mouth daily before breakfast.  . sertraline (ZOLOFT) 100 MG tablet TAKE 1 TABLET(100 MG) BY MOUTH DAILY  . timolol (TIMOPTIC) 0.5 % ophthalmic solution INSTILL 1 DROP IN BOTH EYES TWICE DAILY  . allopurinol (ZYLOPRIM) 300 MG tablet Take 1/2 to 1 tablet daily to prevent Gout  . gabapentin (NEURONTIN) 800 MG tablet TAKE 1 TABLET(800 MG) BY MOUTH THREE TIMES DAILY   Allergies  Allergen Reactions  . Ciprofloxacin Hcl Diarrhea  . Cymbalta [Duloxetine Hcl] Other (See Comments)    Per pt: it did not work  . Xanax [Alprazolam] Other (See Comments)    Excessive sleepiness  . Codeine Other (See Comments)    Per pt: unknown  . Lortab [Hydrocodone-Acetaminophen] Other (See Comments)    Per pt: unknown  . Oxycodone Other (See Comments)    Per pt: unknown   Past Medical History:  Diagnosis Date  . Anemia   . Bipolar 1 disorder ([place])   . Cataract   . Chronic back pain   . Diabetes mellitus without complication ([place])   . Glaucoma   . Hyperlipidemia   . Hypertension   . Hypothyroidism   . Osteoporosis  Past Surgical History:  Procedure Laterality Date  . BACK SURGERY  2007  . BREAST SURGERY Left    CYST REMOVAL  . DILATION AND CURETTAGE OF UTERUS    . EYE SURGERY Left July 2015   Lt CE/ IOL implant  . EYE SURGERY Right July 2016   Rt CE/IOL  . KNEE ARTHROSCOPY Bilateral   . LUMBAR FUSION  2009   Review of Systems  Systems review could not be obtained as the patient left the exam room slamming 2 doors behind her as she screamed at this examiner and her daughter.      Objective:   Physical Exam  BP (!) 164/100   Pulse 80   Temp 97.5 F (36.4 C)   Resp 16   Ht 5\' 3"  (1.6 m)   Wt 235 lb  (106.6 kg)   BMI 41.63 kg/m   Examination was not attempted in consideration of patients confrontational behaviors.     Assessment & Plan:     Patient was offered referral to another primary care office for care or to see an endocrinologist for consideration of alternate diabetic treatments and diabetic teaching as her renal functions are declining beyond the safety of using Metformin.     In discussion with patient's daughter, it was agreed to defer program changes pending today's lab results.   Over 25 minutes of exam, counseling, chart review and critical decision making was performed

## 2016-06-19 NOTE — Patient Instructions (Signed)

## 2016-06-20 LAB — LIPID PANEL
Cholesterol: 172 mg/dL (ref <200)
HDL: 64 mg/dL (ref >50)
LDL CALC: 84 mg/dL (ref <100)
TRIGLYCERIDES: 121 mg/dL (ref <150)
Total CHOL/HDL Ratio: 2.7 Ratio (ref <5.0)
VLDL: 24 mg/dL (ref <30)

## 2016-06-20 LAB — MAGNESIUM: MAGNESIUM: 2.1 mg/dL (ref 1.5–2.5)

## 2016-06-20 LAB — BASIC METABOLIC PANEL WITH GFR
BUN: 51 mg/dL — AB (ref 7–25)
CO2: 25 mmol/L (ref 20–31)
CREATININE: 1.39 mg/dL — AB (ref 0.60–0.93)
Calcium: 10.3 mg/dL (ref 8.6–10.4)
Chloride: 104 mmol/L (ref 98–110)
GFR, EST AFRICAN AMERICAN: 44 mL/min — AB (ref >=60)
GFR, Est Non African American: 38 mL/min — ABNORMAL LOW (ref >=60)
GLUCOSE: 157 mg/dL — AB (ref 65–99)
Potassium: 3.9 mmol/L (ref 3.5–5.3)
Sodium: 142 mmol/L (ref 135–146)

## 2016-06-20 LAB — CBC WITH DIFFERENTIAL/PLATELET
BASOS ABS: 60 {cells}/uL (ref 0–200)
BASOS PCT: 1 %
EOS ABS: 180 {cells}/uL (ref 15–500)
Eosinophils Relative: 3 %
HEMATOCRIT: 34 % — AB (ref 35.0–45.0)
Hemoglobin: 10.9 g/dL — ABNORMAL LOW (ref 11.7–15.5)
LYMPHS PCT: 32 %
Lymphs Abs: 1920 cells/uL (ref 850–3900)
MCH: 28.5 pg (ref 27.0–33.0)
MCHC: 32.1 g/dL (ref 32.0–36.0)
MCV: 89 fL (ref 80.0–100.0)
MONO ABS: 180 {cells}/uL — AB (ref 200–950)
MONOS PCT: 3 %
MPV: 10.5 fL (ref 7.5–12.5)
NEUTROS PCT: 61 %
Neutro Abs: 3660 cells/uL (ref 1500–7800)
PLATELETS: 265 10*3/uL (ref 140–400)
RBC: 3.82 MIL/uL (ref 3.80–5.10)
RDW: 15.1 % — AB (ref 11.0–15.0)
WBC: 6 10*3/uL (ref 3.8–10.8)

## 2016-06-20 LAB — HEPATIC FUNCTION PANEL
ALBUMIN: 3.9 g/dL (ref 3.6–5.1)
ALK PHOS: 56 U/L (ref 33–130)
ALT: 12 U/L (ref 6–29)
AST: 19 U/L (ref 10–35)
BILIRUBIN TOTAL: 0.3 mg/dL (ref 0.2–1.2)
Bilirubin, Direct: 0.1 mg/dL (ref <=0.2)
Indirect Bilirubin: 0.2 mg/dL (ref 0.2–1.2)
TOTAL PROTEIN: 6.5 g/dL (ref 6.1–8.1)

## 2016-06-20 LAB — VITAMIN D 25 HYDROXY (VIT D DEFICIENCY, FRACTURES): VIT D 25 HYDROXY: 64 ng/mL (ref 30–100)

## 2016-06-20 LAB — TSH: TSH: 3.28 m[IU]/L

## 2016-06-20 LAB — HEMOGLOBIN A1C
Hgb A1c MFr Bld: 8.4 % — ABNORMAL HIGH (ref <5.7)
MEAN PLASMA GLUCOSE: 194 mg/dL

## 2016-06-20 LAB — INSULIN, RANDOM: INSULIN: 13.9 u[IU]/mL (ref 2.0–19.6)

## 2016-06-20 LAB — URIC ACID: Uric Acid, Serum: 6.9 mg/dL (ref 2.5–7.0)

## 2016-06-27 ENCOUNTER — Other Ambulatory Visit: Payer: Self-pay | Admitting: Physician Assistant

## 2016-06-27 ENCOUNTER — Other Ambulatory Visit: Payer: Self-pay | Admitting: Internal Medicine

## 2016-06-27 DIAGNOSIS — I10 Essential (primary) hypertension: Secondary | ICD-10-CM

## 2016-06-28 ENCOUNTER — Telehealth: Payer: Self-pay

## 2016-06-28 ENCOUNTER — Other Ambulatory Visit: Payer: Self-pay | Admitting: Physician Assistant

## 2016-06-28 MED ORDER — GLUCOSE BLOOD VI STRP: ORAL_STRIP | 4 refills | Status: DC

## 2016-06-28 NOTE — Telephone Encounter (Signed)
LVM for pt to return office call. 

## 2016-06-28 NOTE — Telephone Encounter (Signed)
Informed pt that medicare would only pay for testing sugars 3 times a day & that @ her next office they can discuss any questions she may have.  Pt seemed happy with information that was given.

## 2016-07-02 ENCOUNTER — Ambulatory Visit: Payer: Self-pay | Admitting: Physician Assistant

## 2016-07-03 ENCOUNTER — Ambulatory Visit: Payer: Self-pay | Admitting: Internal Medicine

## 2016-07-04 ENCOUNTER — Encounter: Payer: Self-pay | Admitting: Physician Assistant

## 2016-07-04 ENCOUNTER — Ambulatory Visit (INDEPENDENT_AMBULATORY_CARE_PROVIDER_SITE_OTHER): Payer: Medicare Other | Admitting: Physician Assistant

## 2016-07-04 VITALS — BP 138/70 | HR 101 | Temp 97.7°F | Resp 14 | Ht 63.0 in | Wt 227.0 lb

## 2016-07-04 DIAGNOSIS — E782 Mixed hyperlipidemia: Secondary | ICD-10-CM | POA: Diagnosis not present

## 2016-07-04 DIAGNOSIS — F319 Bipolar disorder, unspecified: Secondary | ICD-10-CM | POA: Diagnosis not present

## 2016-07-04 DIAGNOSIS — E1129 Type 2 diabetes mellitus with other diabetic kidney complication: Secondary | ICD-10-CM

## 2016-07-04 DIAGNOSIS — I10 Essential (primary) hypertension: Secondary | ICD-10-CM | POA: Diagnosis not present

## 2016-07-04 MED ORDER — PHENTERMINE HCL 37.5 MG PO TABS: 37.5000 mg | ORAL_TABLET | Freq: Every day | ORAL | 0 refills | Status: DC

## 2016-07-04 MED ORDER — GABAPENTIN 800 MG PO TABS: ORAL_TABLET | ORAL | 5 refills | Status: DC

## 2016-07-04 NOTE — Patient Instructions (Addendum)
Take the gabpaentin 800mg  1 in AM, 1 at lunch, and 2 at dinner Take the glipizide depending on your sugars, if you have any low sugars please cut the glipizide in half or stop taking it and we may try the tradjenta.   Please stop the glipizide (Glucotrol). This medication forces your blood sugar down no matter what it is starting at. This can cause diabetics to have to eat more to keep their blood sugar elevated which goes against what our goals are for you. Only take 1/2 of the medication if your sugar is above 150 and you can take a whole pill if your sugar is above 200 in the morning. If at any time you start to have low blood sugars in the morning or during the day please stop this medication. Please never take this medication if you are sick or can not eat. A low blood sugar is much more dangerous than a high blood sugar. Your brain needs two things, sugar and oxygen.   Phentermine  While taking the medication we may ask that you come into the office once a month or once every 2-3 months to monitor your weight, blood pressure, and heart rate. In addition we can help answer your questions about diet, exercise, and help you every step of the way with your weight loss journey. Sometime it is helpful if you bring in a food diary or use an app on your phone such as myfitnesspal to record your calorie intake, especially in the beginning.   You can start out on 1/3 to 1/2 a pill in the morning and if you are tolerating it well you can increase to one pill daily. I also have some patients that take 1/3 or 1/2 at lunch to help prevent night time eating.  This medication is cheapest CASH pay at [facility] is 14-17 dollars and you do NOT need a membership to get meds from there.    What is this medicine? PHENTERMINE (FEN ter meen) decreases your appetite. This medicine is intended to be used in addition to a healthy reduced calorie diet and exercise. The best results are achieved this  way. This medicine is only indicated for short-term use. Eventually your weight loss may level out and the medication will no longer be needed.   How should I use this medicine? Take this medicine by mouth. Follow the directions on the prescription label. The tablets should stay in the bottle until immediately before you take your dose. Take your doses at regular intervals. Do not take your medicine more often than directed.  Overdosage: If you think you have taken too much of this medicine contact a poison control center or emergency room at once. NOTE: This medicine is only for you. Do not share this medicine with others.  What if I miss a dose? If you miss a dose, take it as soon as you can. If it is almost time for your next dose, take only that dose. Do not take double or extra doses. Do not increase or in any way change your dose without consulting your doctor.  What should I watch for while using this medicine? Notify your physician immediately if you become short of breath while doing your normal activities. Do not take this medicine within 6 hours of bedtime. It can keep you from getting to sleep. Avoid drinks that contain caffeine and try to stick to a regular bedtime every night. Do not stand or sit  up quickly, especially if you are an older patient. This reduces the risk of dizzy or fainting spells. Avoid alcoholic drinks.  What side effects may I notice from receiving this medicine? Side effects that you should report to your doctor or health care professional as soon as possible: -chest pain, palpitations -depression or severe changes in mood -increased blood pressure -irritability -nervousness or restlessness -severe dizziness -shortness of breath -problems urinating -unusual swelling of the legs -vomiting  Side effects that usually do not require medical attention (report to your doctor or health care professional if they continue or are bothersome): -blurred vision or  other eye problems -changes in sexual ability or desire -constipation or diarrhea -difficulty sleeping -dry mouth or unpleasant taste -headache -nausea This list may not describe all possible side effects. Call your doctor for medical advice about side effects. You may report side effects to FDA at 1-800-FDA-1088.

## 2016-07-04 NOTE — Progress Notes (Signed)
Assessment and Plan: 1. Essential hypertension Continue medications  2. Morbid Obesity with co morbidities - long discussion about weight loss, diet, and exercise - continue phentemrine for now, tolerating well  3. Type 2 diabetes mellitus with other diabetic kidney complication, without long-term current use of insulin (HCC) Will send in one touch meter, check sugar 3 x a day, continue glipizide and weight loss - if any low sugars may try trajenta, may refer to endocrine - POCT Glucose (Device for Home Use)  4. Bipolar 1 disorder (HCC) Continue zoloft for now, suggest counseling   HPI 70 y.o. obese recently widowed female with history of dCHF, HTN, CKD, bioplar/depression, DM2, noncompliance presents for follow up. She was seen 11/28 by Dr. Melford Aase, she had worsening A1C from 7.8 to 8.4, she presents for DM teaching and to discuss care in this office.  BMI is Body mass index is 40.21 kg/m., she is working on diet and exercise. She has tried to follow a diet and she is on the phentermine. This AM her sugar was 124 and she did not take a glipizide, if it is over 200 she will take a pill. States she can not tell if it is high or low. Has right knee pain, wants to try to get knee replacement and needs weight loss and better control of her sugars with this.   Wt Readings from Last 3 Encounters:  07/04/16 227 lb (103 kg)  06/19/16 235 lb (106.6 kg)  02/21/16 227 lb 3.2 oz (103.1 kg)   We had a long discussion about being defensive and that when she has a question we will be happy to discuss it with her but she should not raise her voice at myself or any of the office staff, she expressed understanding.   Past Medical History:  Diagnosis Date  . Anemia   . Bipolar 1 disorder ([place])   . Cataract   . Chronic back pain   . Diabetes mellitus without complication ([place])   . Glaucoma   . Hyperlipidemia   . Hypertension   . Hypothyroidism   . Osteoporosis      Allergies  Allergen  Reactions  . Ciprofloxacin Hcl Diarrhea  . Cymbalta [Duloxetine Hcl] Other (See Comments)    Per pt: it did not work  . Xanax [Alprazolam] Other (See Comments)    Excessive sleepiness  . Codeine Other (See Comments)    Per pt: unknown  . Lortab [Hydrocodone-Acetaminophen] Other (See Comments)    Per pt: unknown  . Oxycodone Other (See Comments)    Per pt: unknown    Current Outpatient Prescriptions on File Prior to Visit  Medication Sig  . ACCU-CHEK AVIVA PLUS test strip USE TO TEST BLOOD SUGAR THREE TIMES DAILY  . aspirin 81 MG tablet Take 81 mg by mouth daily.  Marland Kitchen atorvastatin (LIPITOR) 80 MG tablet TAKE 1 TABLET(80 MG) BY MOUTH DAILY  . Blood Glucose Monitoring Suppl (ACCU-CHEK AVIVA PLUS) W/DEVICE KIT   . Cholecalciferol (VITAMIN D-3 PO) Take 5,000 Units by mouth every evening.   . Cyanocobalamin (VITAMIN B 12 PO) Take 1,000 mcg by mouth every other day.  Marland Kitchen FLUZONE HIGH-DOSE 0.5 ML SUSY   . furosemide (LASIX) 40 MG tablet TAKE 1 TABLET BY MOUTH TWICE DAILY  . glipiZIDE (GLUCOTROL) 10 MG tablet TAKE 1 TABLET BY MOUTH THREE TIMES DAILY  . Lancets (FREESTYLE) lancets Use as instructed  . latanoprost (XALATAN) 0.005 % ophthalmic solution Place 1 drop into both eyes every evening.   Marland Kitchen  levothyroxine (SYNTHROID, LEVOTHROID) 50 MCG tablet TAKE 1 TABLET BY MOUTH DAILY BEFORE BREAKFAST  . losartan (COZAAR) 100 MG tablet TAKE 1 TABLET BY MOUTH DAILY  . minoxidil (LONITEN) 10 MG tablet TAKE 1/2 TO 1 TABLET BY MOUTH DAILY AS DIRECTED FOR BLOOD PRESSURE  . Multiple Vitamin (MULTIVITAMIN) tablet Take 1 tablet by mouth daily.  . Omega-3 Fatty Acids (FISH OIL) 1000 MG CAPS Take 1,000 mg by mouth every evening.   . sertraline (ZOLOFT) 100 MG tablet TAKE 1 TABLET(100 MG) BY MOUTH DAILY  . timolol (TIMOPTIC) 0.5 % ophthalmic solution INSTILL 1 DROP IN BOTH EYES TWICE DAILY   No current facility-administered medications on file prior to visit.     ROS: all negative except above.   Physical  Exam: Filed Weights   07/04/16 1139  Weight: 227 lb (103 kg)   BP 138/70   Pulse (!) 101   Temp 97.7 F (36.5 C)   Resp 14   Ht _0  (1.6 m)   Wt 227 lb (103 kg)   SpO2 98%   BMI 40.21 kg/m  General Appearance: Well nourished, in no apparent distress. Eyes: PERRLA, EOMs, conjunctiva no swelling or erythema Sinuses: No Frontal/maxillary tenderness ENT/Mouth: Ext aud canals clear, TMs without erythema, bulging. No erythema, swelling, or exudate on post pharynx.  Tonsils not swollen or erythematous. Hearing normal.  Neck: Supple, thyroid normal.  Respiratory: Respiratory effort normal, BS equal bilaterally without rales, rhonchi, wheezing or stridor.  Cardio: RRR with no MRGs. Brisk peripheral pulses without edema.  Abdomen: Soft, + BS.  Non tender, no guarding, rebound, hernias, masses. Lymphatics: Non tender without lymphadenopathy.  Musculoskeletal: Full ROM, 5/5 strength, normal gait.  Skin: Warm, dry without rashes, lesions, ecchymosis.  Neuro: Cranial nerves intact. Normal muscle tone, no cerebellar symptoms. Sensation intact.  Psych: Awake and oriented X 3, normal affect, Insight and Judgment appropriate.     Vicie Mutters, PA-C 5:13 PM [facility]

## 2016-07-09 ENCOUNTER — Other Ambulatory Visit: Payer: Self-pay | Admitting: Internal Medicine

## 2016-07-17 ENCOUNTER — Other Ambulatory Visit: Payer: Self-pay | Admitting: Internal Medicine

## 2016-08-02 DIAGNOSIS — I10 Essential (primary) hypertension: Secondary | ICD-10-CM | POA: Diagnosis not present

## 2016-08-02 DIAGNOSIS — N183 Chronic kidney disease, stage 3 (moderate): Secondary | ICD-10-CM | POA: Diagnosis not present

## 2016-08-06 ENCOUNTER — Ambulatory Visit (INDEPENDENT_AMBULATORY_CARE_PROVIDER_SITE_OTHER): Payer: Medicare Other | Admitting: Physician Assistant

## 2016-08-06 ENCOUNTER — Encounter: Payer: Self-pay | Admitting: Physician Assistant

## 2016-08-06 VITALS — BP 138/90 | HR 67 | Temp 97.5°F | Resp 16 | Ht 63.0 in | Wt 225.0 lb

## 2016-08-06 DIAGNOSIS — N183 Chronic kidney disease, stage 3 unspecified: Secondary | ICD-10-CM

## 2016-08-06 DIAGNOSIS — E1122 Type 2 diabetes mellitus with diabetic chronic kidney disease: Secondary | ICD-10-CM

## 2016-08-06 DIAGNOSIS — M79605 Pain in left leg: Secondary | ICD-10-CM | POA: Diagnosis not present

## 2016-08-06 DIAGNOSIS — F319 Bipolar disorder, unspecified: Secondary | ICD-10-CM | POA: Diagnosis not present

## 2016-08-06 LAB — CBC WITH DIFFERENTIAL/PLATELET
BASOS PCT: 1 %
Basophils Absolute: 50 cells/uL (ref 0–200)
EOS PCT: 3 %
Eosinophils Absolute: 150 cells/uL (ref 15–500)
HCT: 35.1 % (ref 35.0–45.0)
HEMOGLOBIN: 11.4 g/dL — AB (ref 11.7–15.5)
LYMPHS ABS: 1250 {cells}/uL (ref 850–3900)
Lymphocytes Relative: 25 %
MCH: 28.4 pg (ref 27.0–33.0)
MCHC: 32.5 g/dL (ref 32.0–36.0)
MCV: 87.3 fL (ref 80.0–100.0)
MONOS PCT: 6 %
MPV: 10.1 fL (ref 7.5–12.5)
Monocytes Absolute: 300 cells/uL (ref 200–950)
NEUTROS ABS: 3250 {cells}/uL (ref 1500–7800)
Neutrophils Relative %: 65 %
Platelets: 258 10*3/uL (ref 140–400)
RBC: 4.02 MIL/uL (ref 3.80–5.10)
RDW: 15.4 % — ABNORMAL HIGH (ref 11.0–15.0)
WBC: 5 10*3/uL (ref 3.8–10.8)

## 2016-08-06 LAB — TSH: TSH: 4.02 m[IU]/L

## 2016-08-06 MED ORDER — PHENTERMINE HCL 37.5 MG PO TABS: 37.5000 mg | ORAL_TABLET | Freq: Every day | ORAL | 2 refills | Status: DC

## 2016-08-06 MED ORDER — ALLOPURINOL 300 MG PO TABS: 300.0000 mg | ORAL_TABLET | Freq: Every day | ORAL | 2 refills | Status: DC

## 2016-08-06 NOTE — Progress Notes (Signed)
Assessment and Plan: 1. Type 2 diabetes mellitus with stage 3 chronic kidney disease, without long-term current use of insulin (HCC) Given new regimen based on sugars morning AND different for at night May benefit from endocrinologist - CBC with Differential/Platelet - Comprehensive metabolic panel  2. Morbid obesity (BMI 39.21)  Continue weight loss, refilled phentermine - TSH - phentermine (ADIPEX-P) 37.5 MG tablet; Take 1 tablet (37.5 mg total) by mouth daily before breakfast.  Dispense: 30 tablet; Refill: 2  3. Bipolar 1 disorder (HCC) Continue zoloft, may need counseling  4. Pain of left lower extremity ? From decreasing gabapentin but states feeling better off gabapentin, declines additional meds at this time, ? Benefit from Pt patient declines - Uric acid   HPI 71 y.o.female presents for 1 month follow up for diabetes and obesity. She was started on phentermine last visit, doing well. Husband michael died in 07-May-2023. She has been trying to cut back on gabapentin and states it is helping her balance, she is on 1 in the AM and 1 at night. But she states that she has had left anterior pain, burning pain. Nothing worse, but pushing/squeezing on it makes it better. She has not been taking her allopurinol. She followed up with kidney doctor. She has been taking the glipizide based on sugars, will do 1/2 if above 150 and 1 whole if above 200, but has been having sugars 60-70's in the AM.  BMI is Body mass index is 39.86 kg/m., she is working on diet and exercise, she did 1 month with phentermine but has not taken it since last visit.  Wt Readings from Last 3 Encounters:  08/06/16 225 lb (102.1 kg)  07/04/16 227 lb (103 kg)  06/19/16 235 lb (106.6 kg)   Lab Results  Component Value Date   HGBA1C 8.4 (H) 06/19/2016   She is on thyroid medication. Her medication was not changed last visit.   Lab Results  Component Value Date   TSH 3.28 06/19/2016  .   Blood pressure 138/90, pulse 67,  temperature 97.5 F (36.4 C), resp. rate 16, height 5' 3" (1.6 m), weight 225 lb (102.1 kg), SpO2 97 %.   Past Medical History:  Diagnosis Date  . Anemia   . Bipolar 1 disorder ([place])   . Cataract   . Chronic back pain   . Diabetes mellitus without complication ([place])   . Glaucoma   . Hyperlipidemia   . Hypertension   . Hypothyroidism   . Osteoporosis      Allergies  Allergen Reactions  . Ciprofloxacin Hcl Diarrhea  . Cymbalta [Duloxetine Hcl] Other (See Comments)    Per pt: it did not work  . Xanax [Alprazolam] Other (See Comments)    Excessive sleepiness  . Codeine Other (See Comments)    Per pt: unknown  . Lortab [Hydrocodone-Acetaminophen] Other (See Comments)    Per pt: unknown  . Oxycodone Other (See Comments)    Per pt: unknown    Current Outpatient Prescriptions on File Prior to Visit  Medication Sig  . ACCU-CHEK AVIVA PLUS test strip USE TO TEST BLOOD SUGAR THREE TIMES DAILY  . aspirin 81 MG tablet Take 81 mg by mouth daily.  Marland Kitchen atorvastatin (LIPITOR) 80 MG tablet TAKE 1 TABLET(80 MG) BY MOUTH DAILY  . Blood Glucose Monitoring Suppl (ACCU-CHEK AVIVA PLUS) W/DEVICE KIT   . Cholecalciferol (VITAMIN D-3 PO) Take 5,000 Units by mouth every evening.   . Cyanocobalamin (VITAMIN B 12 PO) Take 1,000 mcg by  mouth every other day.  Marland Kitchen FLUZONE HIGH-DOSE 0.5 ML SUSY   . furosemide (LASIX) 40 MG tablet TAKE 1 TABLET BY MOUTH TWICE DAILY  . gabapentin (NEURONTIN) 800 MG tablet TAKE 1 TABLET(800 MG) BY MOUTH UP TO FOUR TIMES DAILY  . glipiZIDE (GLUCOTROL) 10 MG tablet TAKE 1 TABLET BY MOUTH THREE TIMES DAILY  . Lancets (FREESTYLE) lancets Use as instructed  . latanoprost (XALATAN) 0.005 % ophthalmic solution Place 1 drop into both eyes every evening.   Marland Kitchen levothyroxine (SYNTHROID, LEVOTHROID) 50 MCG tablet TAKE 1 TABLET BY MOUTH DAILY BEFORE BREAKFAST  . losartan (COZAAR) 100 MG tablet TAKE 1 TABLET BY MOUTH DAILY  . minoxidil (LONITEN) 10 MG tablet TAKE 1/2 TO 1 TABLET BY MOUTH  DAILY AS DIRECTED FOR BLOOD PRESSURE  . Multiple Vitamin (MULTIVITAMIN) tablet Take 1 tablet by mouth daily.  . Omega-3 Fatty Acids (FISH OIL) 1000 MG CAPS Take 1,000 mg by mouth every evening.   . phentermine (ADIPEX-P) 37.5 MG tablet Take 1 tablet (37.5 mg total) by mouth daily before breakfast.  . sertraline (ZOLOFT) 100 MG tablet TAKE 1 TABLET(100 MG) BY MOUTH DAILY  . timolol (TIMOPTIC) 0.5 % ophthalmic solution INSTILL 1 DROP IN BOTH EYES TWICE DAILY   No current facility-administered medications on file prior to visit.     ROS: all negative except above.   Physical Exam: Filed Weights   08/06/16 1432  Weight: 225 lb (102.1 kg)   BP 138/90   Pulse 67   Temp 97.5 F (36.4 C)   Resp 16   Ht 5' 3" (1.6 m)   Wt 225 lb (102.1 kg)   SpO2 97%   BMI 39.86 kg/m  General Appearance: Well nourished, in no apparent distress. Eyes: PERRLA, EOMs, conjunctiva no swelling or erythema Sinuses: No Frontal/maxillary tenderness ENT/Mouth: Ext aud canals clear, TMs without erythema, bulging. No erythema, swelling, or exudate on post pharynx.  Tonsils not swollen or erythematous. Hearing normal.  Neck: Supple, thyroid normal.  Respiratory: Respiratory effort normal, BS equal bilaterally without rales, rhonchi, wheezing or stridor.  Cardio: RRR with no MRGs. Brisk peripheral pulses without edema.  Abdomen: Soft, + BS.  Non tender, no guarding, rebound, hernias, masses. Lymphatics: Non tender without lymphadenopathy.  Musculoskeletal: Full ROM, 5/5 strength, normal gait.  Skin: Warm, dry without rashes, lesions, ecchymosis.  Neuro: Cranial nerves intact. Normal muscle tone, no cerebellar symptoms. Sensation intact.  Psych: Awake and oriented X 3, normal affect, Insight and Judgment appropriate.   Vicie Mutters, PA-C 2:44 PM [facility]

## 2016-08-06 NOTE — Progress Notes (Signed)
Phentermine was called into pharmacy

## 2016-08-06 NOTE — Patient Instructions (Addendum)
Your A1C is a measure of your sugar over the past 3 months and is not affected by what you have eaten over the past few days. Diabetes increases your chances of stroke and heart attack over 300 % and is the leading cause of blindness and kidney failure in the [place]. Please make sure you decrease bad carbs like white bread, white rice, potatoes, corn, soft drinks, pasta, cereals, refined sugars, sweet tea, dried fruits, and fruit juice. Good carbs are okay to eat in moderation like sweet potatoes, brown rice, whole grain pasta/bread, most fruit (except dried fruit) and you can eat as many veggies as you want.   Greater than 6.5 is considered diabetic. Between 6.4 and 5.7 is prediabetic If your A1C is less than 5.7 you are NOT diabetic.  Targets for Glucose Readings: Time of Check Target for patients WITHOUT Diabetes Target for DIABETICS  Before Meals Less than 100  less than 150  Two hours after meals Less than 200  Less than 250   At night your sugar should be less than 250 about 2 hours after eating If your sugars is greater than 250 at night take 1/2 of the diabetic medication If your sugar is greater than 350 at night take a whole sugar medications at night  Morning sugar should be less than 150 in the morning.  Only take 1/2 of the medication if your sugar is above 150 in the morning and you can take a whole pill if your sugar is above 200 in the morning     Bad carbs also include fruit juice, alcohol, and sweet tea. These are empty calories that do not signal to your brain that you are full.   Please remember the good carbs are still carbs which convert into sugar. So please measure them out no more than 1/2-1 cup of rice, oatmeal, pasta, and beans  Veggies are however free foods! Pile them on.   Not all fruit is created equal. Please see the list below, the fruit at the bottom is higher in sugars than the fruit at the top. Please avoid all dried fruits.      Simple math  prevails.    1st - exercise does not produce significant weight loss - at best one converts fat into muscle , "bulks up", loses inches, but usually stays "weight neutral"     2nd - think of your body weightas a check book: If you eat more calories than you burn up - you save money or gain weight .... Or if you spend more money than you put in the check book, ie burn up more calories than you eat, then you lose weight     3rd - if you walk or run 1 mile, you burn up 100 calories - you have to burn up 3,500 calories to lose 1 pound, ie you have to walk/run 35 miles to lose 1 measly pound. So if you want to lose 10 #, then you have to walk/run 350 miles, so.... clearly exercise is not the solution.     4. So if you consume 1,500 calories, then you have to burn up the equivalent of 15 miles to stay weight neutral - It also stands to reason that if you consume 1,500 cal/day and don't lose weight, then you must be burning up about 1,500 cals/day to stay weight neutral.     5. If you really want to lose weight, you must cut your calorie intake 300 calories /day  and at that rate you should lose about 1 # every 3 days.   6. Please purchase Dr Fara Olden Fuhrman's book(s) "The End of Dieting" & "Eat to Live" . It has some great concepts and recipes.      Marland Kitchen

## 2016-08-07 LAB — COMPREHENSIVE METABOLIC PANEL
ALT: 13 U/L (ref 6–29)
AST: 20 U/L (ref 10–35)
Albumin: 3.9 g/dL (ref 3.6–5.1)
Alkaline Phosphatase: 49 U/L (ref 33–130)
BILIRUBIN TOTAL: 0.5 mg/dL (ref 0.2–1.2)
BUN: 49 mg/dL — AB (ref 7–25)
CALCIUM: 10.2 mg/dL (ref 8.6–10.4)
CO2: 24 mmol/L (ref 20–31)
Chloride: 106 mmol/L (ref 98–110)
Creat: 2.87 mg/dL — ABNORMAL HIGH (ref 0.60–0.93)
Glucose, Bld: 72 mg/dL (ref 65–99)
Potassium: 3.8 mmol/L (ref 3.5–5.3)
Sodium: 145 mmol/L (ref 135–146)
Total Protein: 6.4 g/dL (ref 6.1–8.1)

## 2016-08-07 LAB — URIC ACID: URIC ACID, SERUM: 7.6 mg/dL — AB (ref 2.5–7.0)

## 2016-08-21 ENCOUNTER — Ambulatory Visit: Payer: Self-pay | Admitting: Nurse Practitioner

## 2016-09-04 ENCOUNTER — Other Ambulatory Visit: Payer: Self-pay | Admitting: Physician Assistant

## 2016-09-06 ENCOUNTER — Encounter: Payer: Self-pay | Admitting: Physician Assistant

## 2016-09-06 ENCOUNTER — Ambulatory Visit (INDEPENDENT_AMBULATORY_CARE_PROVIDER_SITE_OTHER): Payer: Medicare Other | Admitting: Physician Assistant

## 2016-09-06 VITALS — BP 132/72 | HR 94 | Temp 97.9°F | Resp 16 | Ht 63.0 in | Wt 208.0 lb

## 2016-09-06 DIAGNOSIS — I10 Essential (primary) hypertension: Secondary | ICD-10-CM

## 2016-09-06 DIAGNOSIS — I5031 Acute diastolic (congestive) heart failure: Secondary | ICD-10-CM

## 2016-09-06 DIAGNOSIS — N183 Chronic kidney disease, stage 3 (moderate): Secondary | ICD-10-CM

## 2016-09-06 DIAGNOSIS — E1122 Type 2 diabetes mellitus with diabetic chronic kidney disease: Secondary | ICD-10-CM

## 2016-09-06 DIAGNOSIS — F319 Bipolar disorder, unspecified: Secondary | ICD-10-CM | POA: Diagnosis not present

## 2016-09-06 DIAGNOSIS — E039 Hypothyroidism, unspecified: Secondary | ICD-10-CM | POA: Diagnosis not present

## 2016-09-06 NOTE — Patient Instructions (Signed)
[facility] Address: [address] [place], [place] [address]  Phone: [phone]     Bad carbs also include fruit juice, alcohol, and sweet tea. These are empty calories that do not signal to your brain that you are full.   Please remember the good carbs are still carbs which convert into sugar. So please measure them out no more than 1/2-1 cup of rice, oatmeal, pasta, and beans  Veggies are however free foods! Pile them on.   Not all fruit is created equal. Please see the list below, the fruit at the bottom is higher in sugars than the fruit at the top. Please avoid all dried fruits.     Simple math prevails.    1st - exercise does not produce significant weight loss - at best one converts fat into muscle , "bulks up", loses inches, but usually stays "weight neutral"     2nd - think of your body weightas a check book: If you eat more calories than you burn up - you save money or gain weight .... Or if you spend more money than you put in the check book, ie burn up more calories than you eat, then you lose weight     3rd - if you walk or run 1 mile, you burn up 100 calories - you have to burn up 3,500 calories to lose 1 pound, ie you have to walk/run 35 miles to lose 1 measly pound. So if you want to lose 10 #, then you have to walk/run 350 miles, so.... clearly exercise is not the solution.     4. So if you consume 1,500 calories, then you have to burn up the equivalent of 15 miles to stay weight neutral - It also stands to reason that if you consume 1,500 cal/day and don't lose weight, then you must be burning up about 1,500 cals/day to stay weight neutral.     5. If you really want to lose weight, you must cut your calorie intake 300 calories /day and at that rate you should lose about 1 # every 3 days.   6. Please purchase Dr Fara Olden Fuhrman's book(s) "The End of Dieting" & "Eat to Live" . It has some great concepts and recipes.

## 2016-09-06 NOTE — Progress Notes (Signed)
Assessment and Plan:  Type 2 diabetes mellitus with stage 3 chronic kidney disease, without long-term current use of insulin ([place]) May benefit from endocrinologist - CBC with Differential/Platelet - Comprehensive metabolic panel  Morbid obesity (BMI 39.21)  Continue weight loss, refilled phentermine - phentermine (ADIPEX-P) 37.5 MG tablet; Take 1 tablet (37.5 mg total) by mouth daily before breakfast.  Dispense: 30 tablet; Refill: 2  Bipolar 1 disorder ([place]) Continue zoloft, suggest counseling   Future Appointments Date Time Provider [place]  10/16/2016 4:30 PM Unk Pinto, MD [facility] None     HPI 71 y.o.female presents for 1 month follow up for diabetes and obesity. She was started on phentermine x 2 months, doing well. Husband michael died in 04/08/16. She has lost another 17 lbs, she is off the glipizide, she has not been taking the gabapentin. She has been taking 2 tylenol  650in the AM and 2 at night. She is on allopurinol now. She is back on trazodone, has not been sleeping since her husband passed.  She has a cat now that is her "buddy" and she likes.  BMI is Body mass index is 36.85 kg/m., she is working on diet and exercise. She is on 1/2 phentermine. And has completely changed her eating habits.   Wt Readings from Last 3 Encounters:  09/06/16 208 lb (94.3 kg)  08/06/16 225 lb (102.1 kg)  07/04/16 227 lb (103 kg)   Wt Readings from Last 6 Encounters:  09/06/16 208 lb (94.3 kg)  08/06/16 225 lb (102.1 kg)  07/04/16 227 lb (103 kg)  06/19/16 235 lb (106.6 kg)  02/21/16 227 lb 3.2 oz (103.1 kg)  12/29/15 221 lb (100.2 kg)   Lab Results  Component Value Date   LABURIC 7.6 (H) 08/06/2016   Lab Results  Component Value Date   HGBA1C 8.4 (H) 06/19/2016   She is on thyroid medication. Her medication was not changed last visit.   Lab Results  Component Value Date   TSH 4.02 08/06/2016  .  Blood pressure 132/72, pulse 94, temperature 97.9 F (36.6  C), resp. rate 16, height _0  (1.6 m), weight 208 lb (94.3 kg), SpO2 99 %.  Past Medical History:  Diagnosis Date  . Anemia   . Bipolar 1 disorder ([place])   . Cataract   . Chronic back pain   . Diabetes mellitus without complication ([place])   . Glaucoma   . Hyperlipidemia   . Hypertension   . Hypothyroidism   . Osteoporosis      Allergies  Allergen Reactions  . Ciprofloxacin Hcl Diarrhea  . Cymbalta [Duloxetine Hcl] Other (See Comments)    Per pt: it did not work  . Xanax [Alprazolam] Other (See Comments)    Excessive sleepiness  . Codeine Other (See Comments)    Per pt: unknown  . Lortab [Hydrocodone-Acetaminophen] Other (See Comments)    Per pt: unknown  . Oxycodone Other (See Comments)    Per pt: unknown    Current Outpatient Prescriptions on File Prior to Visit  Medication Sig  . ACCU-CHEK AVIVA PLUS test strip USE TO TEST BLOOD SUGAR THREE TIMES DAILY  . allopurinol (ZYLOPRIM) 300 MG tablet Take 1 tablet (300 mg total) by mouth daily.  Marland Kitchen aspirin 81 MG tablet Take 81 mg by mouth daily.  Marland Kitchen atorvastatin (LIPITOR) 80 MG tablet TAKE 1 TABLET(80 MG) BY MOUTH DAILY  . Blood Glucose Monitoring Suppl (ACCU-CHEK AVIVA PLUS) W/DEVICE KIT   . Cholecalciferol (VITAMIN D-3 PO) Take  5,000 Units by mouth every evening.   . Cyanocobalamin (VITAMIN B 12 PO) Take 1,000 mcg by mouth every other day.  Marland Kitchen FLUZONE HIGH-DOSE 0.5 ML SUSY   . furosemide (LASIX) 40 MG tablet TAKE 1 TABLET BY MOUTH TWICE DAILY  . glipiZIDE (GLUCOTROL) 10 MG tablet TAKE 1 TABLET BY MOUTH THREE TIMES DAILY  . Lancets (FREESTYLE) lancets Use as instructed  . latanoprost (XALATAN) 0.005 % ophthalmic solution Place 1 drop into both eyes every evening.   Marland Kitchen levothyroxine (SYNTHROID, LEVOTHROID) 50 MCG tablet TAKE 1 TABLET BY MOUTH DAILY BEFORE BREAKFAST  . losartan (COZAAR) 100 MG tablet TAKE 1 TABLET BY MOUTH DAILY  . minoxidil (LONITEN) 10 MG tablet TAKE 1/2 TO 1 TABLET BY MOUTH DAILY AS DIRECTED FOR BLOOD PRESSURE   . Multiple Vitamin (MULTIVITAMIN) tablet Take 1 tablet by mouth daily.  . Omega-3 Fatty Acids (FISH OIL) 1000 MG CAPS Take 1,000 mg by mouth every evening.   . phentermine (ADIPEX-P) 37.5 MG tablet Take 1 tablet (37.5 mg total) by mouth daily before breakfast.  . sertraline (ZOLOFT) 100 MG tablet TAKE 1 TABLET(100 MG) BY MOUTH DAILY  . timolol (TIMOPTIC) 0.5 % ophthalmic solution INSTILL 1 DROP IN BOTH EYES TWICE DAILY   No current facility-administered medications on file prior to visit.     ROS: all negative except above.   Physical Exam: Filed Weights   09/06/16 1448  Weight: 208 lb (94.3 kg)   BP 132/72   Pulse 94   Temp 97.9 F (36.6 C)   Resp 16   Ht _0  (1.6 m)   Wt 208 lb (94.3 kg)   SpO2 99%   BMI 36.85 kg/m  General Appearance: Well nourished, in no apparent distress. Eyes: PERRLA, EOMs, conjunctiva no swelling or erythema Sinuses: No Frontal/maxillary tenderness ENT/Mouth: Ext aud canals clear, TMs without erythema, bulging. No erythema, swelling, or exudate on post pharynx.  Tonsils not swollen or erythematous. Hearing normal.  Neck: Supple, thyroid normal.  Respiratory: Respiratory effort normal, BS equal bilaterally without rales, rhonchi, wheezing or stridor.  Cardio: RRR with no MRGs. Brisk peripheral pulses without edema.  Abdomen: Soft, + BS.  Non tender, no guarding, rebound, hernias, masses. Lymphatics: Non tender without lymphadenopathy.  Musculoskeletal: Full ROM, 5/5 strength, walking with walker Skin: Warm, dry without rashes, lesions, ecchymosis.  Neuro: Cranial nerves intact. Normal muscle tone, no cerebellar symptoms.   Psych: Awake and oriented X 3, normal affect, Insight and Judgment appropriate.   Vicie Mutters, PA-C 3:16 PM [facility]

## 2016-09-19 ENCOUNTER — Ambulatory Visit (INDEPENDENT_AMBULATORY_CARE_PROVIDER_SITE_OTHER): Payer: Medicare Other | Admitting: Physician Assistant

## 2016-09-19 ENCOUNTER — Other Ambulatory Visit: Payer: Self-pay | Admitting: Physician Assistant

## 2016-09-19 ENCOUNTER — Other Ambulatory Visit: Payer: Self-pay | Admitting: Internal Medicine

## 2016-09-19 ENCOUNTER — Ambulatory Visit: Payer: Self-pay | Admitting: Physician Assistant

## 2016-09-19 ENCOUNTER — Encounter: Payer: Self-pay | Admitting: Physician Assistant

## 2016-09-19 VITALS — BP 140/82 | HR 73 | Temp 97.7°F | Resp 16 | Ht 63.0 in | Wt 198.6 lb

## 2016-09-19 DIAGNOSIS — M79605 Pain in left leg: Secondary | ICD-10-CM | POA: Diagnosis not present

## 2016-09-19 DIAGNOSIS — E782 Mixed hyperlipidemia: Secondary | ICD-10-CM | POA: Diagnosis not present

## 2016-09-19 DIAGNOSIS — E1122 Type 2 diabetes mellitus with diabetic chronic kidney disease: Secondary | ICD-10-CM | POA: Diagnosis not present

## 2016-09-19 DIAGNOSIS — I10 Essential (primary) hypertension: Secondary | ICD-10-CM | POA: Diagnosis not present

## 2016-09-19 DIAGNOSIS — R8290 Unspecified abnormal findings in urine: Secondary | ICD-10-CM

## 2016-09-19 DIAGNOSIS — N183 Chronic kidney disease, stage 3 (moderate): Secondary | ICD-10-CM

## 2016-09-19 DIAGNOSIS — E039 Hypothyroidism, unspecified: Secondary | ICD-10-CM | POA: Diagnosis not present

## 2016-09-19 LAB — CBC WITH DIFFERENTIAL/PLATELET
BASOS ABS: 55 {cells}/uL (ref 0–200)
Basophils Relative: 1 %
EOS ABS: 110 {cells}/uL (ref 15–500)
Eosinophils Relative: 2 %
HEMATOCRIT: 39.5 % (ref 35.0–45.0)
Hemoglobin: 12.6 g/dL (ref 11.7–15.5)
LYMPHS PCT: 22 %
Lymphs Abs: 1210 cells/uL (ref 850–3900)
MCH: 28.2 pg (ref 27.0–33.0)
MCHC: 31.9 g/dL — ABNORMAL LOW (ref 32.0–36.0)
MCV: 88.4 fL (ref 80.0–100.0)
MPV: 12 fL (ref 7.5–12.5)
Monocytes Absolute: 220 cells/uL (ref 200–950)
Monocytes Relative: 4 %
Neutro Abs: 3905 cells/uL (ref 1500–7800)
Neutrophils Relative %: 71 %
PLATELETS: 246 10*3/uL (ref 140–400)
RBC: 4.47 MIL/uL (ref 3.80–5.10)
RDW: 16.1 % — AB (ref 11.0–15.0)
WBC: 5.5 10*3/uL (ref 3.8–10.8)

## 2016-09-19 LAB — TSH: TSH: 3.08 m[IU]/L

## 2016-09-19 LAB — HEMOGLOBIN A1C
Hgb A1c MFr Bld: 8 % — ABNORMAL HIGH (ref <5.7)
Mean Plasma Glucose: 183 mg/dL

## 2016-09-19 MED ORDER — NORTRIPTYLINE HCL 25 MG PO CAPS
ORAL_CAPSULE | ORAL | 0 refills | Status: DC
Start: 2016-09-19 — End: 2016-09-19

## 2016-09-19 NOTE — Patient Instructions (Addendum)
Stop the phentermine for now  Start on nortriptyline at night 1-2  For pain/sleep   Going to send to DM educator/endocrin

## 2016-09-19 NOTE — Progress Notes (Signed)
Subjective:    Patient ID: Alyssa [name], female    DOB: December 28, 1945, 71 y.o.   MRN: [id number]  HPI 71 y.o. WF presents with a lot of issues.  She states her night time sugars are good but she will have sugars as high as 200's in the morning. Has never seen a Dm educator.  She has stopped the gabapentin due to fall/imbalance/fatigue but now having left leg/thigh pain, worse in the morning. States she is not sleeping, the trazodone is not helping.  Gagged this AM on pills and vomited bile Saturday AM and Sunday had dizziness. Has not been eating much and has been taking the phentermine still for goal of knee surgery.  Lab Results  Component Value Date   HGBA1C 8.4 (H) 06/19/2016    BMI is Body mass index is 35.18 kg/m., she is working on diet and exercise. Wt Readings from Last 3 Encounters:  09/19/16 198 lb 9.6 oz (90.1 kg)  09/06/16 208 lb (94.3 kg)  08/06/16 225 lb (102.1 kg)    Medications Current Outpatient Prescriptions on File Prior to Visit  Medication Sig  . ACCU-CHEK AVIVA PLUS test strip USE TO TEST BLOOD SUGAR THREE TIMES DAILY  . allopurinol (ZYLOPRIM) 300 MG tablet Take 1 tablet (300 mg total) by mouth daily.  Marland Kitchen aspirin 81 MG tablet Take 81 mg by mouth daily.  Marland Kitchen atorvastatin (LIPITOR) 80 MG tablet TAKE 1 TABLET(80 MG) BY MOUTH DAILY  . Blood Glucose Monitoring Suppl (ACCU-CHEK AVIVA PLUS) W/DEVICE KIT   . Cholecalciferol (VITAMIN D-3 PO) Take 5,000 Units by mouth every evening.   . Cyanocobalamin (VITAMIN B 12 PO) Take 1,000 mcg by mouth every other day.  Marland Kitchen FLUZONE HIGH-DOSE 0.5 ML SUSY   . furosemide (LASIX) 40 MG tablet TAKE 1 TABLET BY MOUTH TWICE DAILY  . Lancets (FREESTYLE) lancets Use as instructed  . latanoprost (XALATAN) 0.005 % ophthalmic solution Place 1 drop into both eyes every evening.   Marland Kitchen levothyroxine (SYNTHROID, LEVOTHROID) 50 MCG tablet TAKE 1 TABLET BY MOUTH DAILY BEFORE BREAKFAST  . losartan (COZAAR) 100 MG tablet TAKE 1 TABLET BY MOUTH DAILY  .  minoxidil (LONITEN) 10 MG tablet TAKE 1/2 TO 1 TABLET BY MOUTH DAILY AS DIRECTED FOR BLOOD PRESSURE  . Multiple Vitamin (MULTIVITAMIN) tablet Take 1 tablet by mouth daily.  . Omega-3 Fatty Acids (FISH OIL) 1000 MG CAPS Take 1,000 mg by mouth every evening.   . phentermine (ADIPEX-P) 37.5 MG tablet Take 1 tablet (37.5 mg total) by mouth daily before breakfast.  . sertraline (ZOLOFT) 100 MG tablet TAKE 1 TABLET(100 MG) BY MOUTH DAILY  . timolol (TIMOPTIC) 0.5 % ophthalmic solution INSTILL 1 DROP IN BOTH EYES TWICE DAILY   No current facility-administered medications on file prior to visit.     Problem list She has Osteoporosis; T2_NIDDM w/CKD 3 ; Hypertension; Bipolar 1 disorder ([place]); Hyperlipidemia; Vitamin D deficiency; Morbid obesity (BMI 39.21) ; Depression, major, in partial remission ([place]); Acute diastolic congestive heart failure (HCC); CKD (chronic kidney disease) stage 3, GFR 30-59 ml/min; Gout; Medication management; Hypothyroidism; Encounter for Medicare annual wellness exam; Actinic keratitis; and T2_NIDDM on her problem list.  Blood pressure 140/82, pulse 73, temperature 97.7 F (36.5 C), resp. rate 16, height '5\' 3"'$  (1.6 m), weight 198 lb 9.6 oz (90.1 kg), SpO2 96 %.  Medications Current Outpatient Prescriptions on File Prior to Visit  Medication Sig  . ACCU-CHEK AVIVA PLUS test strip USE TO TEST BLOOD SUGAR THREE TIMES DAILY  .  allopurinol (ZYLOPRIM) 300 MG tablet Take 1 tablet (300 mg total) by mouth daily.  Marland Kitchen aspirin 81 MG tablet Take 81 mg by mouth daily.  Marland Kitchen atorvastatin (LIPITOR) 80 MG tablet TAKE 1 TABLET(80 MG) BY MOUTH DAILY  . Blood Glucose Monitoring Suppl (ACCU-CHEK AVIVA PLUS) W/DEVICE KIT   . Cholecalciferol (VITAMIN D-3 PO) Take 5,000 Units by mouth every evening.   . Cyanocobalamin (VITAMIN B 12 PO) Take 1,000 mcg by mouth every other day.  Marland Kitchen FLUZONE HIGH-DOSE 0.5 ML SUSY   . furosemide (LASIX) 40 MG tablet TAKE 1 TABLET BY MOUTH TWICE DAILY  . Lancets  (FREESTYLE) lancets Use as instructed  . latanoprost (XALATAN) 0.005 % ophthalmic solution Place 1 drop into both eyes every evening.   Marland Kitchen levothyroxine (SYNTHROID, LEVOTHROID) 50 MCG tablet TAKE 1 TABLET BY MOUTH DAILY BEFORE BREAKFAST  . losartan (COZAAR) 100 MG tablet TAKE 1 TABLET BY MOUTH DAILY  . minoxidil (LONITEN) 10 MG tablet TAKE 1/2 TO 1 TABLET BY MOUTH DAILY AS DIRECTED FOR BLOOD PRESSURE  . Multiple Vitamin (MULTIVITAMIN) tablet Take 1 tablet by mouth daily.  . Omega-3 Fatty Acids (FISH OIL) 1000 MG CAPS Take 1,000 mg by mouth every evening.   . phentermine (ADIPEX-P) 37.5 MG tablet Take 1 tablet (37.5 mg total) by mouth daily before breakfast.  . sertraline (ZOLOFT) 100 MG tablet TAKE 1 TABLET(100 MG) BY MOUTH DAILY  . timolol (TIMOPTIC) 0.5 % ophthalmic solution INSTILL 1 DROP IN BOTH EYES TWICE DAILY   No current facility-administered medications on file prior to visit.     Problem list She has Osteoporosis; T2_NIDDM w/CKD 3 ; Hypertension; Bipolar 1 disorder ([place]); Hyperlipidemia; Vitamin D deficiency; Morbid obesity (BMI 39.21) ; Depression, major, in partial remission ([place]); Acute diastolic congestive heart failure (HCC); CKD (chronic kidney disease) stage 3, GFR 30-59 ml/min; Gout; Medication management; Hypothyroidism; Encounter for Medicare annual wellness exam; Actinic keratitis; and T2_NIDDM on her problem list.  Review of Systems  Constitutional: Negative for activity change, appetite change, chills, diaphoresis, fatigue, fever and unexpected weight change.  HENT: Negative.   Respiratory: Negative.  Negative for shortness of breath.   Cardiovascular: Positive for leg swelling. Negative for chest pain and palpitations.  Gastrointestinal: Positive for nausea and vomiting. Negative for abdominal distention, abdominal pain, anal bleeding, blood in stool, constipation, diarrhea and rectal pain.  Genitourinary: Positive for frequency.  Musculoskeletal: Positive for  arthralgias and myalgias. Negative for back pain and neck pain.  Skin: Negative.   Neurological: Negative for tremors, seizures, syncope, facial asymmetry, speech difficulty, weakness, light-headedness, numbness and headaches.  Hematological: Negative.   Psychiatric/Behavioral: Positive for sleep disturbance. Negative for agitation, behavioral problems, confusion, decreased concentration, dysphoric mood, hallucinations, self-injury and suicidal ideas. The patient is nervous/anxious. The patient is not hyperactive.        Objective:   Physical Exam  Constitutional: She is oriented to person, place, and time. She appears well-developed and well-nourished. She does not have a sickly appearance. No distress.  HENT:  Nose: Right sinus exhibits no maxillary sinus tenderness and no frontal sinus tenderness. Left sinus exhibits no maxillary sinus tenderness and no frontal sinus tenderness.  Mouth/Throat: Uvula is midline and oropharynx is clear and moist. No oropharyngeal exudate.  Neck: Trachea normal, normal range of motion and phonation normal. Neck supple. No tracheal tenderness present. No tracheal deviation present.  Cardiovascular: Normal rate, regular rhythm and normal heart sounds.   No murmur heard. Pulmonary/Chest: Effort normal. No stridor. No respiratory distress. She has  no decreased breath sounds. She has no wheezes. She has no rhonchi. She has no rales. She exhibits no tenderness.  Abdominal: Soft. Bowel sounds are normal. There is no tenderness ([place]). There is no rebound and no guarding.  Obese abdomen  Neurological: She is alert and oriented to person, place, and time. She has normal strength. She displays no tremor. No cranial nerve deficit or sensory deficit. She exhibits normal muscle tone. Coordination and gait abnormal.  Gait is antalgic with a walker.   Skin: Skin is warm and dry. No rash noted. She is not diaphoretic.  Psychiatric: She has a normal mood and affect.   Vitals reviewed.      Assessment & Plan:  1. Essential hypertension - continue medications, DASH diet, exercise and monitor at home. Call if greater than 130/80.  - CBC with Differential/Platelet - BASIC METABOLIC PANEL WITH GFR - Hepatic function panel  2. Type 2 diabetes mellitus with stage 3 chronic kidney disease, without long-term current use of insulin ([place]) Would benefit from DM educator and endocrinologist, also due to difference of opinions, suggest following with another PCP, patient and daughter understands, a letter will be sent.  - Hemoglobin A1c  3. Acquired hypothyroidism - TSH  4. Morbid obesity (BMI 39.21)  - long discussion about weight loss, diet, and exercise  5. Pain of left lower extremity Stop gabapentin, try the nortriptyline, may need to follow up ortho for back If any worsening pain, abnormal urine, weakness, follow up ER or ortho  6. Mixed hyperlipidemia - Lipid panel  7. Abnormal urinalysis - Urinalysis, Routine w reflex microscopic - Urine culture

## 2016-09-20 LAB — URINALYSIS, ROUTINE W REFLEX MICROSCOPIC
Bilirubin Urine: NEGATIVE
GLUCOSE, UA: NEGATIVE
Hgb urine dipstick: NEGATIVE
KETONES UR: NEGATIVE
Leukocytes, UA: NEGATIVE
NITRITE: NEGATIVE
PH: 5.5 (ref 5.0–8.0)
SPECIFIC GRAVITY, URINE: 1.015 (ref 1.001–1.035)

## 2016-09-20 LAB — BASIC METABOLIC PANEL WITH GFR
BUN: 44 mg/dL — AB (ref 7–25)
CHLORIDE: 91 mmol/L — AB (ref 98–110)
CO2: 21 mmol/L (ref 20–31)
CREATININE: 2.76 mg/dL — AB (ref 0.60–0.93)
Calcium: 10.8 mg/dL — ABNORMAL HIGH (ref 8.6–10.4)
GFR, Est African American: 19 mL/min — ABNORMAL LOW (ref >=60)
GFR, Est Non African American: 17 mL/min — ABNORMAL LOW (ref >=60)
GLUCOSE: 258 mg/dL — AB (ref 65–99)
Potassium: 3.6 mmol/L (ref 3.5–5.3)
Sodium: 132 mmol/L — ABNORMAL LOW (ref 135–146)

## 2016-09-20 LAB — LIPID PANEL
CHOL/HDL RATIO: 3.8 ratio (ref <5.0)
Cholesterol: 213 mg/dL — ABNORMAL HIGH (ref <200)
HDL: 56 mg/dL (ref >50)
LDL CALC: 123 mg/dL — AB (ref <100)
TRIGLYCERIDES: 170 mg/dL — AB (ref <150)
VLDL: 34 mg/dL — AB (ref <30)

## 2016-09-20 LAB — HEPATIC FUNCTION PANEL
ALBUMIN: 4.2 g/dL (ref 3.6–5.1)
ALT: 11 U/L (ref 6–29)
AST: 24 U/L (ref 10–35)
Alkaline Phosphatase: 61 U/L (ref 33–130)
BILIRUBIN TOTAL: 0.7 mg/dL (ref 0.2–1.2)
Bilirubin, Direct: 0.1 mg/dL (ref <=0.2)
Indirect Bilirubin: 0.6 mg/dL (ref 0.2–1.2)
Total Protein: 7.1 g/dL (ref 6.1–8.1)

## 2016-09-20 LAB — URINALYSIS, MICROSCOPIC ONLY
BACTERIA UA: NONE SEEN [HPF]
Casts: NONE SEEN [LPF]
Crystals: NONE SEEN [HPF]
Squamous Epithelial / LPF: NONE SEEN [HPF] (ref <=5)
Yeast: NONE SEEN [HPF]

## 2016-09-20 LAB — URINE CULTURE: ORGANISM ID, BACTERIA: NO GROWTH

## 2016-09-25 ENCOUNTER — Encounter: Payer: Self-pay | Admitting: Physician Assistant

## 2016-09-25 DIAGNOSIS — N183 Chronic kidney disease, stage 3 unspecified: Secondary | ICD-10-CM

## 2016-09-25 DIAGNOSIS — E1122 Type 2 diabetes mellitus with diabetic chronic kidney disease: Secondary | ICD-10-CM

## 2016-10-15 ENCOUNTER — Encounter: Payer: Self-pay | Admitting: Physician Assistant

## 2016-10-16 ENCOUNTER — Ambulatory Visit: Payer: Self-pay | Admitting: Internal Medicine

## 2016-10-16 DIAGNOSIS — M79605 Pain in left leg: Secondary | ICD-10-CM | POA: Diagnosis not present

## 2016-10-16 DIAGNOSIS — M25561 Pain in right knee: Secondary | ICD-10-CM | POA: Diagnosis not present

## 2016-10-16 DIAGNOSIS — G8929 Other chronic pain: Secondary | ICD-10-CM | POA: Diagnosis not present

## 2016-10-16 DIAGNOSIS — M6281 Muscle weakness (generalized): Secondary | ICD-10-CM | POA: Diagnosis not present

## 2016-10-16 DIAGNOSIS — Z8639 Personal history of other endocrine, nutritional and metabolic disease: Secondary | ICD-10-CM | POA: Diagnosis not present

## 2016-10-17 ENCOUNTER — Other Ambulatory Visit: Payer: Self-pay | Admitting: Internal Medicine

## 2016-10-17 ENCOUNTER — Other Ambulatory Visit: Payer: Self-pay | Admitting: Physician Assistant

## 2016-10-25 ENCOUNTER — Encounter: Payer: Self-pay | Admitting: Physician Assistant

## 2016-11-01 ENCOUNTER — Encounter: Payer: Self-pay | Admitting: Physician Assistant

## 2016-11-23 DIAGNOSIS — M1711 Unilateral primary osteoarthritis, right knee: Secondary | ICD-10-CM | POA: Diagnosis not present

## 2016-11-23 DIAGNOSIS — M5416 Radiculopathy, lumbar region: Secondary | ICD-10-CM | POA: Diagnosis not present

## 2016-11-23 DIAGNOSIS — M961 Postlaminectomy syndrome, not elsewhere classified: Secondary | ICD-10-CM | POA: Diagnosis not present

## 2016-11-27 ENCOUNTER — Encounter: Payer: Self-pay | Admitting: Physician Assistant

## 2016-12-06 ENCOUNTER — Encounter: Payer: Self-pay | Admitting: Internal Medicine

## 2016-12-11 ENCOUNTER — Other Ambulatory Visit: Payer: Self-pay | Admitting: Physician Assistant

## 2016-12-19 ENCOUNTER — Other Ambulatory Visit: Payer: Self-pay | Admitting: Internal Medicine

## 2016-12-19 DIAGNOSIS — I10 Essential (primary) hypertension: Secondary | ICD-10-CM

## 2016-12-21 ENCOUNTER — Encounter: Payer: Self-pay | Admitting: Physician Assistant

## 2016-12-21 DIAGNOSIS — M5442 Lumbago with sciatica, left side: Secondary | ICD-10-CM | POA: Diagnosis not present

## 2016-12-21 DIAGNOSIS — M170 Bilateral primary osteoarthritis of knee: Secondary | ICD-10-CM | POA: Diagnosis not present

## 2016-12-21 DIAGNOSIS — M961 Postlaminectomy syndrome, not elsewhere classified: Secondary | ICD-10-CM | POA: Diagnosis not present

## 2016-12-21 DIAGNOSIS — G8929 Other chronic pain: Secondary | ICD-10-CM | POA: Diagnosis not present

## 2016-12-21 DIAGNOSIS — M5441 Lumbago with sciatica, right side: Secondary | ICD-10-CM | POA: Diagnosis not present

## 2016-12-25 NOTE — Telephone Encounter (Signed)
Faxed all requested medical records to [facility]. Transfer of care. [phone], fax [phone]. Per patient request. Notified family that records have been sent.

## 2016-12-28 ENCOUNTER — Other Ambulatory Visit: Payer: Self-pay | Admitting: Physician Assistant

## 2016-12-28 DIAGNOSIS — M5117 Intervertebral disc disorders with radiculopathy, lumbosacral region: Secondary | ICD-10-CM | POA: Diagnosis not present

## 2016-12-28 DIAGNOSIS — M5116 Intervertebral disc disorders with radiculopathy, lumbar region: Secondary | ICD-10-CM | POA: Diagnosis not present

## 2016-12-28 DIAGNOSIS — M4726 Other spondylosis with radiculopathy, lumbar region: Secondary | ICD-10-CM | POA: Diagnosis not present

## 2017-01-04 ENCOUNTER — Inpatient Hospital Stay (HOSPITAL_COMMUNITY)
Admission: EM | Admit: 2017-01-04 | Discharge: 2017-01-12 | DRG: 291 | Disposition: A | Discharge status: Home Health | Payer: Medicare Other | Attending: Internal Medicine | Admitting: Internal Medicine

## 2017-01-04 ENCOUNTER — Emergency Department (HOSPITAL_COMMUNITY): Payer: Medicare Other

## 2017-01-04 ENCOUNTER — Encounter (HOSPITAL_COMMUNITY): Payer: Self-pay

## 2017-01-04 DIAGNOSIS — I5033 Acute on chronic diastolic (congestive) heart failure: Secondary | ICD-10-CM | POA: Diagnosis not present

## 2017-01-04 DIAGNOSIS — Z9114 Patient's other noncompliance with medication regimen: Secondary | ICD-10-CM

## 2017-01-04 DIAGNOSIS — N183 Chronic kidney disease, stage 3 unspecified: Secondary | ICD-10-CM

## 2017-01-04 DIAGNOSIS — N179 Acute kidney failure, unspecified: Secondary | ICD-10-CM

## 2017-01-04 DIAGNOSIS — G8929 Other chronic pain: Secondary | ICD-10-CM | POA: Diagnosis present

## 2017-01-04 DIAGNOSIS — E1122 Type 2 diabetes mellitus with diabetic chronic kidney disease: Secondary | ICD-10-CM | POA: Diagnosis present

## 2017-01-04 DIAGNOSIS — R1111 Vomiting without nausea: Secondary | ICD-10-CM | POA: Diagnosis not present

## 2017-01-04 DIAGNOSIS — R740 Nonspecific elevation of levels of transaminase and lactic acid dehydrogenase [LDH]: Secondary | ICD-10-CM | POA: Diagnosis not present

## 2017-01-04 DIAGNOSIS — M549 Dorsalgia, unspecified: Secondary | ICD-10-CM | POA: Diagnosis not present

## 2017-01-04 DIAGNOSIS — E1165 Type 2 diabetes mellitus with hyperglycemia: Secondary | ICD-10-CM | POA: Diagnosis not present

## 2017-01-04 DIAGNOSIS — K8020 Calculus of gallbladder without cholecystitis without obstruction: Secondary | ICD-10-CM | POA: Diagnosis not present

## 2017-01-04 DIAGNOSIS — I130 Hypertensive heart and chronic kidney disease with heart failure and stage 1 through stage 4 chronic kidney disease, or unspecified chronic kidney disease: Principal | ICD-10-CM | POA: Diagnosis present

## 2017-01-04 DIAGNOSIS — I5021 Acute systolic (congestive) heart failure: Secondary | ICD-10-CM | POA: Diagnosis not present

## 2017-01-04 DIAGNOSIS — Z6841 Body Mass Index (BMI) 40.0 and over, adult: Secondary | ICD-10-CM | POA: Diagnosis not present

## 2017-01-04 DIAGNOSIS — N185 Chronic kidney disease, stage 5: Secondary | ICD-10-CM | POA: Diagnosis not present

## 2017-01-04 DIAGNOSIS — Z86010 Personal history of colonic polyps: Secondary | ICD-10-CM

## 2017-01-04 DIAGNOSIS — E1129 Type 2 diabetes mellitus with other diabetic kidney complication: Secondary | ICD-10-CM | POA: Diagnosis present

## 2017-01-04 DIAGNOSIS — M810 Age-related osteoporosis without current pathological fracture: Secondary | ICD-10-CM | POA: Diagnosis present

## 2017-01-04 DIAGNOSIS — H409 Unspecified glaucoma: Secondary | ICD-10-CM | POA: Diagnosis not present

## 2017-01-04 DIAGNOSIS — Z7984 Long term (current) use of oral hypoglycemic drugs: Secondary | ICD-10-CM | POA: Diagnosis not present

## 2017-01-04 DIAGNOSIS — K5900 Constipation, unspecified: Secondary | ICD-10-CM | POA: Diagnosis not present

## 2017-01-04 DIAGNOSIS — Z981 Arthrodesis status: Secondary | ICD-10-CM

## 2017-01-04 DIAGNOSIS — K7689 Other specified diseases of liver: Secondary | ICD-10-CM | POA: Diagnosis not present

## 2017-01-04 DIAGNOSIS — E119 Type 2 diabetes mellitus without complications: Secondary | ICD-10-CM | POA: Diagnosis not present

## 2017-01-04 DIAGNOSIS — E039 Hypothyroidism, unspecified: Secondary | ICD-10-CM | POA: Diagnosis present

## 2017-01-04 DIAGNOSIS — I509 Heart failure, unspecified: Secondary | ICD-10-CM | POA: Diagnosis not present

## 2017-01-04 DIAGNOSIS — I5032 Chronic diastolic (congestive) heart failure: Secondary | ICD-10-CM

## 2017-01-04 DIAGNOSIS — D649 Anemia, unspecified: Secondary | ICD-10-CM

## 2017-01-04 DIAGNOSIS — M109 Gout, unspecified: Secondary | ICD-10-CM | POA: Diagnosis present

## 2017-01-04 DIAGNOSIS — I5031 Acute diastolic (congestive) heart failure: Secondary | ICD-10-CM

## 2017-01-04 DIAGNOSIS — I10 Essential (primary) hypertension: Secondary | ICD-10-CM | POA: Diagnosis not present

## 2017-01-04 DIAGNOSIS — I272 Pulmonary hypertension, unspecified: Secondary | ICD-10-CM | POA: Diagnosis not present

## 2017-01-04 DIAGNOSIS — N170 Acute kidney failure with tubular necrosis: Secondary | ICD-10-CM | POA: Diagnosis not present

## 2017-01-04 DIAGNOSIS — Z79899 Other long term (current) drug therapy: Secondary | ICD-10-CM

## 2017-01-04 DIAGNOSIS — J90 Pleural effusion, not elsewhere classified: Secondary | ICD-10-CM | POA: Diagnosis not present

## 2017-01-04 DIAGNOSIS — Z885 Allergy status to narcotic agent status: Secondary | ICD-10-CM

## 2017-01-04 DIAGNOSIS — M5136 Other intervertebral disc degeneration, lumbar region: Secondary | ICD-10-CM | POA: Diagnosis not present

## 2017-01-04 DIAGNOSIS — E785 Hyperlipidemia, unspecified: Secondary | ICD-10-CM | POA: Diagnosis not present

## 2017-01-04 DIAGNOSIS — R42 Dizziness and giddiness: Secondary | ICD-10-CM | POA: Diagnosis not present

## 2017-01-04 DIAGNOSIS — D631 Anemia in chronic kidney disease: Secondary | ICD-10-CM | POA: Diagnosis not present

## 2017-01-04 DIAGNOSIS — Z961 Presence of intraocular lens: Secondary | ICD-10-CM | POA: Diagnosis present

## 2017-01-04 DIAGNOSIS — M5441 Lumbago with sciatica, right side: Secondary | ICD-10-CM | POA: Diagnosis not present

## 2017-01-04 DIAGNOSIS — M5442 Lumbago with sciatica, left side: Secondary | ICD-10-CM | POA: Diagnosis not present

## 2017-01-04 DIAGNOSIS — Z881 Allergy status to other antibiotic agents status: Secondary | ICD-10-CM

## 2017-01-04 DIAGNOSIS — F319 Bipolar disorder, unspecified: Secondary | ICD-10-CM | POA: Diagnosis present

## 2017-01-04 DIAGNOSIS — R945 Abnormal results of liver function studies: Secondary | ICD-10-CM | POA: Diagnosis not present

## 2017-01-04 DIAGNOSIS — E1121 Type 2 diabetes mellitus with diabetic nephropathy: Secondary | ICD-10-CM | POA: Diagnosis not present

## 2017-01-04 DIAGNOSIS — Z794 Long term (current) use of insulin: Secondary | ICD-10-CM | POA: Diagnosis not present

## 2017-01-04 DIAGNOSIS — I517 Cardiomegaly: Secondary | ICD-10-CM | POA: Diagnosis not present

## 2017-01-04 DIAGNOSIS — R748 Abnormal levels of other serum enzymes: Secondary | ICD-10-CM | POA: Diagnosis present

## 2017-01-04 DIAGNOSIS — Z9889 Other specified postprocedural states: Secondary | ICD-10-CM

## 2017-01-04 DIAGNOSIS — Z888 Allergy status to other drugs, medicaments and biological substances status: Secondary | ICD-10-CM

## 2017-01-04 DIAGNOSIS — Z7982 Long term (current) use of aspirin: Secondary | ICD-10-CM

## 2017-01-04 LAB — URINALYSIS, ROUTINE W REFLEX MICROSCOPIC
BILIRUBIN URINE: NEGATIVE
Glucose, UA: >=500 mg/dL — AB
Hgb urine dipstick: NEGATIVE
Ketones, ur: NEGATIVE mg/dL
NITRITE: NEGATIVE
PH: 6 (ref 5.0–8.0)
Protein, ur: 100 mg/dL — AB
SPECIFIC GRAVITY, URINE: 1.009 (ref 1.005–1.030)
SQUAMOUS EPITHELIAL / LPF: NONE SEEN

## 2017-01-04 LAB — COMPREHENSIVE METABOLIC PANEL
ALK PHOS: 88 U/L (ref 38–126)
ALT: 192 U/L — AB (ref 14–54)
ANION GAP: 10 (ref 5–15)
AST: 81 U/L — ABNORMAL HIGH (ref 15–41)
Albumin: 3.8 g/dL (ref 3.5–5.0)
BILIRUBIN TOTAL: 0.6 mg/dL (ref 0.3–1.2)
BUN: 71 mg/dL — ABNORMAL HIGH (ref 6–20)
CALCIUM: 10.1 mg/dL (ref 8.9–10.3)
CO2: 21 mmol/L — ABNORMAL LOW (ref 22–32)
CREATININE: 3.54 mg/dL — AB (ref 0.44–1.00)
Chloride: 112 mmol/L — ABNORMAL HIGH (ref 101–111)
GFR calc non Af Amer: 12 mL/min — ABNORMAL LOW (ref >60)
GFR, EST AFRICAN AMERICAN: 14 mL/min — AB (ref >60)
GLUCOSE: 237 mg/dL — AB (ref 65–99)
Potassium: 4.5 mmol/L (ref 3.5–5.1)
Sodium: 143 mmol/L (ref 135–145)
TOTAL PROTEIN: 6.7 g/dL (ref 6.5–8.1)

## 2017-01-04 LAB — LIPASE, BLOOD: Lipase: 46 U/L (ref 11–51)

## 2017-01-04 LAB — IRON AND TIBC
Iron: 40 ug/dL (ref 28–170)
Saturation Ratios: 15 % (ref 10.4–31.8)
TIBC: 273 ug/dL (ref 250–450)
UIBC: 233 ug/dL

## 2017-01-04 LAB — CBC
HEMATOCRIT: 26.2 % — AB (ref 36.0–46.0)
Hemoglobin: 8.3 g/dL — ABNORMAL LOW (ref 12.0–15.0)
MCH: 29.2 pg (ref 26.0–34.0)
MCHC: 31.7 g/dL (ref 30.0–36.0)
MCV: 92.3 fL (ref 78.0–100.0)
PLATELETS: 233 10*3/uL (ref 150–400)
RBC: 2.84 MIL/uL — ABNORMAL LOW (ref 3.87–5.11)
RDW: 16.5 % — ABNORMAL HIGH (ref 11.5–15.5)
WBC: 7.1 10*3/uL (ref 4.0–10.5)

## 2017-01-04 LAB — FERRITIN: FERRITIN: 187 ng/mL (ref 11–307)

## 2017-01-04 LAB — TROPONIN I
TROPONIN I: 0.07 ng/mL — AB (ref <0.03)
Troponin I: 0.06 ng/mL (ref <0.03)

## 2017-01-04 LAB — RETICULOCYTES
RBC.: 2.74 MIL/uL — ABNORMAL LOW (ref 3.87–5.11)
RETIC COUNT ABSOLUTE: 35.6 10*3/uL (ref 19.0–186.0)
RETIC CT PCT: 1.3 % (ref 0.4–3.1)

## 2017-01-04 LAB — BRAIN NATRIURETIC PEPTIDE: B Natriuretic Peptide: 899.1 pg/mL — ABNORMAL HIGH (ref 0.0–100.0)

## 2017-01-04 LAB — POC OCCULT BLOOD, ED: FECAL OCCULT BLD: NEGATIVE

## 2017-01-04 LAB — VITAMIN B12: Vitamin B-12: 1735 pg/mL — ABNORMAL HIGH (ref 180–914)

## 2017-01-04 LAB — FOLATE: Folate: 47.6 ng/mL (ref >5.9)

## 2017-01-04 MED ORDER — TIMOLOL MALEATE 0.5 % OP SOLN
1.0000 [drp] | Freq: Two times a day (BID) | OPHTHALMIC | Status: DC
  Administered 2017-01-04 – 2017-01-12 (×14): 1 [drp] via OPHTHALMIC
  Filled 2017-01-04: qty 5

## 2017-01-04 MED ORDER — CARVEDILOL 3.125 MG PO TABS
3.1250 mg | ORAL_TABLET | Freq: Two times a day (BID) | ORAL | Status: DC
  Administered 2017-01-05 – 2017-01-12 (×14): 3.125 mg via ORAL
  Filled 2017-01-04 (×15): qty 1

## 2017-01-04 MED ORDER — LEVOTHYROXINE SODIUM 50 MCG PO TABS
50.0000 ug | ORAL_TABLET | Freq: Every day | ORAL | Status: DC
  Administered 2017-01-05 – 2017-01-06 (×2): 50 ug via ORAL
  Filled 2017-01-04 (×2): qty 1

## 2017-01-04 MED ORDER — VITAMIN B-12 1000 MCG PO TABS
1000.0000 ug | ORAL_TABLET | ORAL | Status: DC
  Administered 2017-01-05 – 2017-01-11 (×4): 1000 ug via ORAL
  Filled 2017-01-04 (×5): qty 1

## 2017-01-04 MED ORDER — ADULT MULTIVITAMIN W/MINERALS CH
1.0000 | ORAL_TABLET | Freq: Every day | ORAL | Status: DC
  Administered 2017-01-05 – 2017-01-12 (×8): 1 via ORAL
  Filled 2017-01-04 (×9): qty 1

## 2017-01-04 MED ORDER — OMEGA-3-ACID ETHYL ESTERS 1 G PO CAPS
1.0000 g | ORAL_CAPSULE | Freq: Every day | ORAL | Status: DC
  Administered 2017-01-05 – 2017-01-12 (×8): 1 g via ORAL
  Filled 2017-01-04 (×8): qty 1

## 2017-01-04 MED ORDER — SERTRALINE HCL 100 MG PO TABS
100.0000 mg | ORAL_TABLET | Freq: Every day | ORAL | Status: DC
  Administered 2017-01-05 – 2017-01-06 (×2): 100 mg via ORAL
  Filled 2017-01-04 (×4): qty 1

## 2017-01-04 MED ORDER — ASPIRIN 81 MG PO CHEW
81.0000 mg | CHEWABLE_TABLET | Freq: Every day | ORAL | Status: DC
  Administered 2017-01-05 – 2017-01-12 (×8): 81 mg via ORAL
  Filled 2017-01-04 (×8): qty 1

## 2017-01-04 MED ORDER — SODIUM CHLORIDE 0.9% FLUSH
3.0000 mL | Freq: Two times a day (BID) | INTRAVENOUS | Status: DC
  Administered 2017-01-05 – 2017-01-12 (×15): 3 mL via INTRAVENOUS

## 2017-01-04 MED ORDER — INSULIN ASPART 100 UNIT/ML ~~LOC~~ SOLN
0.0000 [IU] | Freq: Every day | SUBCUTANEOUS | Status: DC
  Administered 2017-01-06 – 2017-01-09 (×3): 2 [IU] via SUBCUTANEOUS
  Administered 2017-01-10 – 2017-01-11 (×2): 3 [IU] via SUBCUTANEOUS

## 2017-01-04 MED ORDER — LATANOPROST 0.005 % OP SOLN
1.0000 [drp] | Freq: Every evening | OPHTHALMIC | Status: DC
  Administered 2017-01-04 – 2017-01-11 (×8): 1 [drp] via OPHTHALMIC
  Filled 2017-01-04: qty 2.5

## 2017-01-04 MED ORDER — FUROSEMIDE 10 MG/ML IJ SOLN
40.0000 mg | Freq: Two times a day (BID) | INTRAMUSCULAR | Status: DC
  Administered 2017-01-05: 40 mg via INTRAVENOUS
  Filled 2017-01-04: qty 4

## 2017-01-04 MED ORDER — ATORVASTATIN CALCIUM 80 MG PO TABS
80.0000 mg | ORAL_TABLET | Freq: Every day | ORAL | Status: DC
  Administered 2017-01-04 – 2017-01-05 (×2): 80 mg via ORAL
  Filled 2017-01-04 (×2): qty 1

## 2017-01-04 MED ORDER — MINOXIDIL 2.5 MG PO TABS
5.0000 mg | ORAL_TABLET | Freq: Every day | ORAL | Status: DC
  Administered 2017-01-05 – 2017-01-12 (×8): 5 mg via ORAL
  Filled 2017-01-04 (×8): qty 2

## 2017-01-04 MED ORDER — LABETALOL HCL 5 MG/ML IV SOLN
5.0000 mg | Freq: Once | INTRAVENOUS | Status: AC
  Administered 2017-01-04: 5 mg via INTRAVENOUS
  Filled 2017-01-04: qty 4

## 2017-01-04 MED ORDER — ISOSORB DINITRATE-HYDRALAZINE 20-37.5 MG PO TABS
1.0000 | ORAL_TABLET | Freq: Three times a day (TID) | ORAL | Status: DC
  Administered 2017-01-04 – 2017-01-12 (×22): 1 via ORAL
  Filled 2017-01-04 (×22): qty 1

## 2017-01-04 MED ORDER — ZOLPIDEM TARTRATE 5 MG PO TABS
5.0000 mg | ORAL_TABLET | Freq: Every evening | ORAL | Status: DC | PRN
  Administered 2017-01-04: 5 mg via ORAL
  Filled 2017-01-04: qty 1

## 2017-01-04 MED ORDER — ACETAMINOPHEN 325 MG PO TABS: 650.0000 mg | ORAL_TABLET | ORAL | Status: DC | PRN

## 2017-01-04 MED ORDER — SODIUM CHLORIDE 0.9% FLUSH: 3.0000 mL | INTRAVENOUS | Status: DC | PRN

## 2017-01-04 MED ORDER — ENOXAPARIN SODIUM 30 MG/0.3ML ~~LOC~~ SOLN: 30.0000 mg | SUBCUTANEOUS | Status: DC

## 2017-01-04 MED ORDER — VITAMIN D 1000 UNITS PO TABS
5000.0000 [IU] | ORAL_TABLET | Freq: Every evening | ORAL | Status: DC
  Administered 2017-01-05 – 2017-01-11 (×7): 5000 [IU] via ORAL
  Filled 2017-01-04 (×7): qty 5

## 2017-01-04 MED ORDER — INSULIN ASPART 100 UNIT/ML ~~LOC~~ SOLN
0.0000 [IU] | Freq: Three times a day (TID) | SUBCUTANEOUS | Status: DC
  Administered 2017-01-05: 5 [IU] via SUBCUTANEOUS
  Administered 2017-01-05: 3 [IU] via SUBCUTANEOUS
  Administered 2017-01-06 (×2): 2 [IU] via SUBCUTANEOUS
  Administered 2017-01-06 – 2017-01-07 (×2): 3 [IU] via SUBCUTANEOUS
  Administered 2017-01-07: 5 [IU] via SUBCUTANEOUS
  Administered 2017-01-07: 2 [IU] via SUBCUTANEOUS
  Administered 2017-01-08: 8 [IU] via SUBCUTANEOUS
  Administered 2017-01-08: 2 [IU] via SUBCUTANEOUS
  Administered 2017-01-08 – 2017-01-09 (×3): 5 [IU] via SUBCUTANEOUS
  Administered 2017-01-10: 3 [IU] via SUBCUTANEOUS
  Administered 2017-01-10 (×2): 2 [IU] via SUBCUTANEOUS
  Administered 2017-01-11: 8 [IU] via SUBCUTANEOUS
  Administered 2017-01-11: 2 [IU] via SUBCUTANEOUS
  Administered 2017-01-11: 3 [IU] via SUBCUTANEOUS
  Administered 2017-01-12: 8 [IU] via SUBCUTANEOUS
  Administered 2017-01-12: 3 [IU] via SUBCUTANEOUS

## 2017-01-04 MED ORDER — ONDANSETRON HCL 4 MG/2ML IJ SOLN: 4.0000 mg | Freq: Four times a day (QID) | INTRAMUSCULAR | Status: DC | PRN

## 2017-01-04 MED ORDER — SODIUM CHLORIDE 0.9 % IV SOLN: 250.0000 mL | INTRAVENOUS | Status: DC | PRN

## 2017-01-04 NOTE — ED Notes (Signed)
Pt was brought in by her daughter with c/o "she has just not felt right, possible gas pains." She reports on Wednesday pt was having diarrhea, abdominal pain and nausea and abdominal bloating and was relieved with imodium and ginger ale. But she reports since then, pt still has not been feeling well. Pt currently denies any abdominal pain or diarrhea but reports feeling more weak and "lethargic." pt is able to sit up in wheelchair independently, eyes open, A&Ox4, speaking full, clear, complete sentences.

## 2017-01-04 NOTE — ED Notes (Signed)
Patient transported to CT 

## 2017-01-04 NOTE — ED Notes (Signed)
ED Provider at bedside. 

## 2017-01-04 NOTE — ED Provider Notes (Signed)
[facility] DEPT Provider Note   CSN: [id number] Arrival date & time: 01/04/17  1241     History   Chief Complaint Chief Complaint  Patient presents with  . Abdominal Pain    HPI Alyssa [name] is a 71 y.o. female.   Abdominal Pain   This is a new problem. The current episode started more than 2 days ago. The problem occurs constantly. The pain is associated with eating. The pain is located in the generalized abdominal region. The quality of the pain is sharp.    Past Medical History:  Diagnosis Date  . Anemia   . Bipolar 1 disorder ([place])   . Cataract   . Chronic back pain   . Diabetes mellitus without complication ([place])   . Glaucoma   . Hyperlipidemia   . Hypertension   . Hypothyroidism   . Osteoporosis     Patient Active Problem List   Diagnosis Date Noted  . Anemia 01/04/2017  . Acute CHF (congestive heart failure) ([place]) 01/04/2017  . T2_NIDDM 03/31/2015  . Actinic keratitis 02/23/2015  . Encounter for Medicare annual wellness exam 02/09/2015  . Medication management 07/14/2014  . Hypothyroidism 07/14/2014  . CKD (chronic kidney disease) stage 3, GFR 30-59 ml/min 06/24/2014  . Gout 06/24/2014  . Acute diastolic congestive heart failure ([place]) 10/13/2013  . Morbid obesity (BMI 39.21)  07/15/2013  . Depression, major, in partial remission ([place]) 07/15/2013  . Vitamin D deficiency   . Osteoporosis   . 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 Hypertension   . Bipolar 1 disorder ([place])   . Hyperlipidemia     Past Surgical History:  Procedure Laterality Date  . BACK SURGERY  2007  . BREAST SURGERY Left    CYST REMOVAL  . DILATION AND CURETTAGE OF UTERUS    . EYE SURGERY Left July 2015   Lt CE/ IOL implant  . EYE SURGERY Right July 2016   Rt CE/IOL  . KNEE ARTHROSCOPY Bilateral   . LUMBAR FUSION  2009    OB History    No data available       Home Medications    Prior to Admission medications   Medication Sig Start Date End Date Taking? Authorizing Provider    allopurinol (ZYLOPRIM) 300 MG tablet Take 1 tablet (300 mg total) by mouth daily. 08/06/16 08/06/17 Yes Vicie Mutters, PA-C  aspirin 81 MG tablet Take 81 mg by mouth daily.   Yes [provider]  atorvastatin (LIPITOR) 80 MG tablet TAKE 1 TABLET(80 MG) BY MOUTH DAILY 06/05/16  Yes Unk Pinto, MD  Cholecalciferol (VITAMIN D-3 PO) Take 5,000 Units by mouth every evening.    Yes [provider]  Cyanocobalamin (VITAMIN B 12 PO) Take 1,000 mcg by mouth every other day.   Yes [provider]  furosemide (LASIX) 40 MG tablet TAKE 1 TABLET BY MOUTH TWICE DAILY 10/18/16  Yes Vicie Mutters, PA-C  gabapentin (NEURONTIN) 800 MG tablet Take 800 mg by mouth 4 (four) times daily. 10/28/16  Yes [provider]  glipiZIDE (GLUCOTROL) 10 MG tablet Take 10 mg by mouth as needed (for blood sugar).  10/29/16  Yes [provider]  latanoprost (XALATAN) 0.005 % ophthalmic solution Place 1 drop into both eyes every evening.  03/31/14  Yes [provider]  levothyroxine (SYNTHROID, LEVOTHROID) 50 MCG tablet TAKE 1 TABLET BY MOUTH DAILY BEFORE BREAKFAST 09/04/16  Yes Unk Pinto, MD  losartan (COZAAR) 100 MG tablet TAKE 1 TABLET BY MOUTH DAILY  06/27/16  Yes Unk Pinto, MD  minoxidil (LONITEN) 10 MG tablet TAKE 1/2 TO 1 TABLET BY MOUTH DAILY AS DIRECTED FOR BLOOD PRESSURE Patient taking differently: TAKE 1 TABLET BY MOUTH DAILY AS DIRECTED FOR BLOOD PRESSURE 09/20/16  Yes Vicie Mutters, PA-C  Multiple Vitamin (MULTIVITAMIN) tablet Take 1 tablet by mouth daily.   Yes [provider]  Omega-3 Fatty Acids (FISH OIL) 1000 MG CAPS Take 1,000 mg by mouth every evening.    Yes [provider]  timolol (TIMOPTIC) 0.5 % ophthalmic solution INSTILL 1 DROP IN BOTH EYES TWICE DAILY 10/21/15  Yes Unk Pinto, MD  ACCU-CHEK AVIVA PLUS test strip USE TO TEST BLOOD SUGAR THREE TIMES DAILY 06/28/16   Forcucci, Loma Sousa, PA-C  Blood Glucose Monitoring Suppl  (ACCU-CHEK AVIVA PLUS) W/DEVICE KIT  09/13/14   [provider]  Lancets (FREESTYLE) lancets Use as instructed 09/15/15   Starlyn Skeans, PA-C    Family History Family History  Problem Relation Age of Onset  . Adopted: Yes    Social History Social History  Substance Use Topics  . Smoking status: Never Smoker  . Smokeless tobacco: Never Used  . Alcohol use Yes     Comment: social drinker- glass of wine or cocktail     Allergies   Ciprofloxacin hcl; Cymbalta [duloxetine hcl]; Xanax [alprazolam]; Codeine; Lortab [hydrocodone-acetaminophen]; and Oxycodone   Review of Systems Review of Systems  Gastrointestinal: Positive for abdominal pain.  All other systems reviewed and are negative.    Physical Exam Updated Vital Signs BP (!) 182/84 (BP Location: Left Arm)   Pulse 87   Temp 98.4 F (36.9 C) (Oral)   Resp 20   Ht '5\' 3"'$  (1.6 m)   Wt 100.8 kg (222 lb 3.2 oz)   SpO2 92%   BMI 39.36 kg/m   Physical Exam  Constitutional: She appears well-developed and well-nourished.  HENT:  Head: Normocephalic and atraumatic.  Eyes: Conjunctivae and EOM are normal.  Neck: Normal range of motion.  Cardiovascular: Normal rate and regular rhythm.  Exam reveals no friction rub.   No murmur heard. Pulmonary/Chest: Effort normal. No stridor. No respiratory distress.  Abdominal: Soft. She exhibits distension.  Musculoskeletal: She exhibits edema. She exhibits no deformity.  Neurological: She is alert. No cranial nerve deficit. Coordination normal.  Skin: Skin is warm and dry. No pallor.  Nursing note and vitals reviewed.    ED Treatments / Results  Labs (all labs ordered are listed, but only abnormal results are displayed) Labs Reviewed  COMPREHENSIVE METABOLIC PANEL - Abnormal; Notable for the following:       Result Value   Chloride 112 (*)    CO2 21 (*)    Glucose, Bld 237 (*)    BUN 71 (*)    Creatinine, Ser 3.54 (*)    AST 81 (*)    ALT 192 (*)    GFR calc  non Af Amer 12 (*)    GFR calc Af Amer 14 (*)    All other components within normal limits  URINALYSIS, ROUTINE W REFLEX MICROSCOPIC - Abnormal; Notable for the following:    Glucose, UA >=500 (*)    Protein, ur 100 (*)    Leukocytes, UA SMALL (*)    Bacteria, UA RARE (*)    All other components within normal limits  CBC - Abnormal; Notable for the following:    RBC 2.84 (*)    Hemoglobin 8.3 (*)    HCT 26.2 (*)    RDW  16.5 (*)    All other components within normal limits  BRAIN NATRIURETIC PEPTIDE - Abnormal; Notable for the following:    B Natriuretic Peptide 899.1 (*)    All other components within normal limits  TROPONIN I - Abnormal; Notable for the following:    Troponin I 0.06 (*)    All other components within normal limits  TROPONIN I - Abnormal; Notable for the following:    Troponin I 0.07 (*)    All other components within normal limits  VITAMIN B12 - Abnormal; Notable for the following:    Vitamin B-12 1,735 (*)    All other components within normal limits  RETICULOCYTES - Abnormal; Notable for the following:    RBC. 2.74 (*)    All other components within normal limits  LIPASE, BLOOD  FOLATE  IRON AND TIBC  FERRITIN  TROPONIN I  TROPONIN I  COMPREHENSIVE METABOLIC PANEL  BRAIN NATRIURETIC PEPTIDE  CBC WITH DIFFERENTIAL/PLATELET  LIPID PANEL  HEMOGLOBIN A1C  CBC  MAGNESIUM  TSH  HEMOGLOBIN A1C  POC OCCULT BLOOD, ED    EKG  EKG Interpretation None       Radiology Ct Abdomen Pelvis Wo Contrast  Result Date: 01/04/2017 CLINICAL DATA:  Dizziness.  Abdominal pain with nausea and vomiting. EXAM: CT CHEST, ABDOMEN AND PELVIS WITHOUT CONTRAST TECHNIQUE: Multidetector CT imaging of the chest, abdomen and pelvis was performed following the standard protocol without IV contrast. COMPARISON:  None. FINDINGS: CT CHEST FINDINGS Cardiovascular: Cardiomegaly with small volume pericardial fluid. Enlarged main pulmonary artery at 4 cm, usually seen with pulmonary  hypertension. Diffuse atherosclerotic calcification of the coronaries. Multifocal aortic and great vessel calcification. No acute vascular finding. Mediastinum/Nodes: Hilar enlargement it appears vascular. No adenopathy noted. Negative esophagus. Lungs/Pleura: Moderate layering pleural effusions, greater on the right. Multi segment atelectasis. The aerated lung shows no edema. No pneumothorax. Musculoskeletal: See below CT ABDOMEN PELVIS FINDINGS Hepatobiliary: No focal liver abnormality.Subtle layering calculi. No inflammatory changes in the biliary tree. Normal common bile duct diameter. Pancreas: Unremarkable. Spleen: Unremarkable. Adrenals/Urinary Tract: Negative adrenals. No hydronephrosis or stone. Hilar calcifications appear atherosclerotic. Unremarkable bladder. Stomach/Bowel: No obstruction. No appendicitis. Extensive sigmoid diverticulosis. Vascular/Lymphatic: Diffuse atherosclerotic calcification. No noted adenopathy. No mass or adenopathy. Reproductive:Fibroid uterus with 4 cm calcified/ hyalinized fibroid. Other: No ascites or pneumoperitoneum.  Anasarca. Musculoskeletal: No acute finding. Severe lumbar facet arthropathy. Status post L4-5 fusion. L5-S1 ankylosis or fusion. Extensive thoracic spondylosis with multi-level ankylosis. Advanced glenohumeral osteoarthritis. Due to patient size, some of the abdominal wall is not visible. IMPRESSION: 1. Cardiomegaly with volume overload. Moderate right and small left pleural effusions with multi segment atelectasis. Extensive anasarca. 2. Probable pulmonary hypertension. 3. Extensive colonic diverticulosis. 4. Cholelithiasis. Electronically Signed   By: Monte Fantasia M.D.   On: 01/04/2017 20:04   Ct Head Wo Contrast  Result Date: 01/04/2017 CLINICAL DATA:  Dizziness and weakness. EXAM: CT HEAD WITHOUT CONTRAST TECHNIQUE: Contiguous axial images were obtained from the base of the skull through the vertex without intravenous contrast. COMPARISON:   10/12/2013 head CT, MRI 01/13/2014 FINDINGS: Brain: Chronic moderate superficial atrophy with small vessel ischemic disease of periventricular and subcortical white matter. No acute intracranial hemorrhage, midline shift or edema. No extra-axial fluid collections. The basal cisterns and fourth ventricle are midline. Vascular: Atherosclerotic calcifications of the vertebral arteries and cavernous sinus carotids. No hyperdense vessels. Skull: Negative for fracture or focal lesions. Sinuses/Orbits: No acute finding. Other: None. IMPRESSION: Chronic moderate superficial atrophy with small vessel ischemic disease.  No acute intracranial abnormality. Electronically Signed   By: Ashley Royalty M.D.   On: 01/04/2017 19:47   Ct Chest Wo Contrast  Result Date: 01/04/2017 CLINICAL DATA:  Dizziness.  Abdominal pain with nausea and vomiting. EXAM: CT CHEST, ABDOMEN AND PELVIS WITHOUT CONTRAST TECHNIQUE: Multidetector CT imaging of the chest, abdomen and pelvis was performed following the standard protocol without IV contrast. COMPARISON:  None. FINDINGS: CT CHEST FINDINGS Cardiovascular: Cardiomegaly with small volume pericardial fluid. Enlarged main pulmonary artery at 4 cm, usually seen with pulmonary hypertension. Diffuse atherosclerotic calcification of the coronaries. Multifocal aortic and great vessel calcification. No acute vascular finding. Mediastinum/Nodes: Hilar enlargement it appears vascular. No adenopathy noted. Negative esophagus. Lungs/Pleura: Moderate layering pleural effusions, greater on the right. Multi segment atelectasis. The aerated lung shows no edema. No pneumothorax. Musculoskeletal: See below CT ABDOMEN PELVIS FINDINGS Hepatobiliary: No focal liver abnormality.Subtle layering calculi. No inflammatory changes in the biliary tree. Normal common bile duct diameter. Pancreas: Unremarkable. Spleen: Unremarkable. Adrenals/Urinary Tract: Negative adrenals. No hydronephrosis or stone. Hilar calcifications  appear atherosclerotic. Unremarkable bladder. Stomach/Bowel: No obstruction. No appendicitis. Extensive sigmoid diverticulosis. Vascular/Lymphatic: Diffuse atherosclerotic calcification. No noted adenopathy. No mass or adenopathy. Reproductive:Fibroid uterus with 4 cm calcified/ hyalinized fibroid. Other: No ascites or pneumoperitoneum.  Anasarca. Musculoskeletal: No acute finding. Severe lumbar facet arthropathy. Status post L4-5 fusion. L5-S1 ankylosis or fusion. Extensive thoracic spondylosis with multi-level ankylosis. Advanced glenohumeral osteoarthritis. Due to patient size, some of the abdominal wall is not visible. IMPRESSION: 1. Cardiomegaly with volume overload. Moderate right and small left pleural effusions with multi segment atelectasis. Extensive anasarca. 2. Probable pulmonary hypertension. 3. Extensive colonic diverticulosis. 4. Cholelithiasis. Electronically Signed   By: Monte Fantasia M.D.   On: 01/04/2017 20:04    Procedures Procedures (including critical care time)  Medications Ordered in ED Medications  sertraline (ZOLOFT) tablet 100 mg (not administered)  minoxidil (LONITEN) tablet 5 mg (not administered)  levothyroxine (SYNTHROID, LEVOTHROID) tablet 50 mcg (not administered)  timolol (TIMOPTIC) 0.5 % ophthalmic solution 1 drop (1 drop Both Eyes Given 01/04/17 2356)  atorvastatin (LIPITOR) tablet 80 mg (80 mg Oral Given 01/04/17 2357)  vitamin B-12 (CYANOCOBALAMIN) tablet 1,000 mcg (not administered)  latanoprost (XALATAN) 0.005 % ophthalmic solution 1 drop (1 drop Both Eyes Given 01/04/17 2356)  cholecalciferol (VITAMIN D) tablet 5,000 Units (not administered)  multivitamin with minerals tablet 1 tablet (not administered)  omega-3 acid ethyl esters (LOVAZA) capsule 1 g (not administered)  aspirin chewable tablet 81 mg (not administered)  sodium chloride flush (NS) 0.9 % injection 3 mL (not administered)  sodium chloride flush (NS) 0.9 % injection 3 mL (not administered)  0.9  %  sodium chloride infusion (not administered)  acetaminophen (TYLENOL) tablet 650 mg (not administered)  ondansetron (ZOFRAN) injection 4 mg (not administered)  enoxaparin (LOVENOX) injection 30 mg (not administered)  furosemide (LASIX) injection 40 mg (not administered)  carvedilol (COREG) tablet 3.125 mg (not administered)  isosorbide-hydrALAZINE (BIDIL) 20-37.5 MG per tablet 1 tablet (1 tablet Oral Given 01/04/17 2356)  zolpidem (AMBIEN) tablet 5 mg (5 mg Oral Given 01/04/17 2356)  insulin aspart (novoLOG) injection 0-15 Units (not administered)  insulin aspart (novoLOG) injection 0-5 Units (not administered)  labetalol (NORMODYNE,TRANDATE) injection 5 mg (5 mg Intravenous Given 01/04/17 1742)     Initial Impression / Assessment and Plan / ED Course  I have reviewed the triage vital signs and the nursing notes.  Pertinent labs & imaging results that were available during my care of the patient were  reviewed by me and considered in my medical decision making (see chart for details).    71 yo here with abdominal pain and intermittent confusion. Also increased swelling in legs and abdomen on exam.  Concern for possible HF exacerbation. Had an MRI that showed something in uterus today so could have some type of neoplasm causing symptoms.  Also with drop in hemoglobin along with worsening kidney function.  Heme negative, anemia 2/2 AKI? Plan for admission.   Final Clinical Impressions(s) / ED Diagnoses   Final diagnoses:  Acute kidney injury ([place])  Acute congestive heart failure, unspecified heart failure type ([place])  Anemia, unspecified type    New Prescriptions Current Discharge Medication List       Merrily Pew, MD 01/05/17 [phone]

## 2017-01-04 NOTE — H&P (Signed)
History and Physical    Alyssa [name] WGY:[id number] DOB: 05-09-1946 DOA: 01/04/2017  Referring MD/NP/PA: EDP PCP: Patient, No Pcp Per  Outpatient Specialists:  Patient coming from: Home  Chief Complaint: Feeling poorly  HPI: Alyssa [name] is a 71 y.o. female with medical history significant for but not limited to type 2 diabetes, hypertension and hyperlipidemia presenting with several days history of "feeling poorly. The last several days patient has been feeling weak with easy fatigability and mild shortness of breath on exertion without fever or chills or cough. Had noted some lower extremity swelling as well. She had also been a little bit confused.  3 days ago she had some abdominal bloating with abdominal pain which was generalized and aching in character with some diarrhea which was relieved with change in an Imodium, which has since resolved.  Patient is unable to give specific history,which is limited, mainly by her daughter at bedside as noted above.  ED Course: At the ED patient had a BP of 152/84 with heart rate of 87 and O2 saturation of 92% on room air. 12-lead EKG revealed nonspecific ST-T wave changes.CT of the abdomen/pelvis and chest was notable for cardiomegaly with lateral pleural effusion, right more than left, with fluid overload/Anasarca and probable pulmonary hypertension.head CT was negative. Of note patient's hemoglobin had gone from 12.6 on 09/19/2016 to 8.3 today without history of hematochezia or melena -Patient is said to have had a recent MRI which had noted an abnormal finding in her uterus. She is admitted for further care. Review of Systems: As per HPI otherwise 10 point review of systems negative.   Past Medical History:  Diagnosis Date  . Anemia   . Bipolar 1 disorder ([place])   . Cataract   . Chronic back pain   . Diabetes mellitus without complication ([place])   . Glaucoma   . Hyperlipidemia   . Hypertension   . Hypothyroidism   . Osteoporosis      Past Surgical History:  Procedure Laterality Date  . BACK SURGERY  2007  . BREAST SURGERY Left    CYST REMOVAL  . DILATION AND CURETTAGE OF UTERUS    . EYE SURGERY Left July 2015   Lt CE/ IOL implant  . EYE SURGERY Right July 2016   Rt CE/IOL  . KNEE ARTHROSCOPY Bilateral   . LUMBAR FUSION  2009     reports that she has never smoked. She has never used smokeless tobacco. She reports that she drinks alcohol. She reports that she does not use drugs.  Allergies  Allergen Reactions  . Ciprofloxacin Hcl Diarrhea  . Cymbalta [Duloxetine Hcl] Other (See Comments)    Per pt: it did not work  . Xanax [Alprazolam] Other (See Comments)    Excessive sleepiness  . Codeine Other (See Comments)    Per pt: unknown  . Lortab [Hydrocodone-Acetaminophen] Other (See Comments)    Per pt: unknown  . Oxycodone Other (See Comments)    Per pt: unknown    Family History  Problem Relation Age of Onset  . Adopted: Yes     Prior to Admission medications   Medication Sig Start Date End Date Taking? Authorizing Provider  ACCU-CHEK AVIVA PLUS test strip USE TO TEST BLOOD SUGAR THREE TIMES DAILY 06/28/16   Forcucci, Loma Sousa, PA-C  allopurinol (ZYLOPRIM) 300 MG tablet Take 1 tablet (300 mg total) by mouth daily. 08/06/16 08/06/17  Vicie Mutters, PA-C  aspirin 81 MG tablet Take 81 mg by mouth daily.  [provider]  atorvastatin (LIPITOR) 80 MG tablet TAKE 1 TABLET(80 MG) BY MOUTH DAILY 06/05/16   Unk Pinto, MD  Blood Glucose Monitoring Suppl (ACCU-CHEK AVIVA PLUS) W/DEVICE KIT  09/13/14   [provider]  Cholecalciferol (VITAMIN D-3 PO) Take 5,000 Units by mouth every evening.     [provider]  Cyanocobalamin (VITAMIN B 12 PO) Take 1,000 mcg by mouth every other day.    [provider]  FLUZONE HIGH-DOSE 0.5 ML SUSY  04/04/16   [provider]  furosemide (LASIX) 40 MG tablet TAKE 1 TABLET BY MOUTH TWICE DAILY 10/18/16   Vicie Mutters, PA-C   Lancets (FREESTYLE) lancets Use as instructed 09/15/15   Forcucci, Loma Sousa, PA-C  latanoprost (XALATAN) 0.005 % ophthalmic solution Place 1 drop into both eyes every evening.  03/31/14   [provider]  levothyroxine (SYNTHROID, LEVOTHROID) 50 MCG tablet TAKE 1 TABLET BY MOUTH DAILY BEFORE BREAKFAST 09/04/16   Unk Pinto, MD  losartan (COZAAR) 100 MG tablet TAKE 1 TABLET BY MOUTH DAILY 06/27/16   Unk Pinto, MD  minoxidil (LONITEN) 10 MG tablet TAKE 1/2 TO 1 TABLET BY MOUTH DAILY AS DIRECTED FOR BLOOD PRESSURE 09/20/16   Vicie Mutters, PA-C  Multiple Vitamin (MULTIVITAMIN) tablet Take 1 tablet by mouth daily.    [provider]  nortriptyline (PAMELOR) 25 MG capsule TAKE 1 TO 2 CAPSULES BY MOUTH AT NIGHT FOR SLEEP OR PAIN 10/18/16   Vicie Mutters, PA-C  Omega-3 Fatty Acids (FISH OIL) 1000 MG CAPS Take 1,000 mg by mouth every evening.     [provider]  phentermine (ADIPEX-P) 37.5 MG tablet Take 1 tablet (37.5 mg total) by mouth daily before breakfast. 08/06/16   Vicie Mutters, PA-C  sertraline (ZOLOFT) 100 MG tablet TAKE 1 TABLET(100 MG) BY MOUTH DAILY 10/18/16   Vicie Mutters, PA-C  timolol (TIMOPTIC) 0.5 % ophthalmic solution INSTILL 1 DROP IN BOTH EYES TWICE DAILY 10/21/15   Unk Pinto, MD    Physical Exam: Vitals:   01/04/17 1645 01/04/17 1700 01/04/17 1730 01/04/17 1845  BP:  (!) 189/94 (!) 174/63   Pulse: 98 95 86 76  Resp:    20  Temp:      TempSrc:      SpO2: 93% 90% 93% 93%      Constitutional: NAD, calm, comfortable Vitals:   01/04/17 1645 01/04/17 1700 01/04/17 1730 01/04/17 1845  BP:  (!) 189/94 (!) 174/63   Pulse: 98 95 86 76  Resp:    20  Temp:      TempSrc:      SpO2: 93% 90% 93% 93%   Eyes: PERRL,(+) conjunctival pallor ENMT: Mucous membranes are moist. Posterior pharynx clear of any exudate or lesions.Normal dentition.  Neck: normal, supple, no masses, no thyromegaly Respiratory: Diminished breath sounds at the  bases with occasional crackles crackles. Normal respiratory effort. No accessory muscle use.  Cardiovascular: Regular rate and rhythm, no murmurs / rubs / gallops. 2+ pedal pulses. No carotid bruits.  Abdomen: no tenderness, no masses palpated. No hepatosplenomegaly. Bowel sounds positive.  Extremities: no clubbing / cyanosis. Bipedal pitting edema   Neurologic: Alert and 3, no obvious acute focal deficits  Psychiatric: Normal judgment and insight. Normal mood.    Labs on Admission: I have personally reviewed following labs and imaging studies  CBC:  Recent Labs Lab 01/04/17 1744  WBC 7.1  HGB 8.3*  HCT 26.2*  MCV 92.3  PLT 062   Basic Metabolic Panel:  Recent  Labs Lab 01/04/17 1322  NA 143  K 4.5  CL 112*  CO2 21*  GLUCOSE 237*  BUN 71*  CREATININE 3.54*  CALCIUM 10.1   GFR: CrCl cannot be calculated (Unknown ideal weight.). Liver Function Tests:  Recent Labs Lab 01/04/17 1322  AST 81*  ALT 192*  ALKPHOS 88  BILITOT 0.6  PROT 6.7  ALBUMIN 3.8    Recent Labs Lab 01/04/17 1322  LIPASE 46   No results for input(s): AMMONIA in the last 168 hours. Coagulation Profile: No results for input(s): INR, PROTIME in the last 168 hours. Cardiac Enzymes:  Recent Labs Lab 01/04/17 1744  TROPONINI 0.06*   BNP (last 3 results) No results for input(s): PROBNP in the last 8760 hours. HbA1C: No results for input(s): HGBA1C in the last 72 hours. CBG: No results for input(s): GLUCAP in the last 168 hours. Lipid Profile: No results for input(s): CHOL, HDL, LDLCALC, TRIG, CHOLHDL, LDLDIRECT in the last 72 hours. Thyroid Function Tests: No results for input(s): TSH, T4TOTAL, FREET4, T3FREE, THYROIDAB in the last 72 hours. Anemia Panel: No results for input(s): VITAMINB12, FOLATE, FERRITIN, TIBC, IRON, RETICCTPCT in the last 72 hours. Urine analysis:    Component Value Date/Time   COLORURINE YELLOW 01/04/2017 1624   APPEARANCEUR CLEAR 01/04/2017 1624    LABSPEC 1.009 01/04/2017 1624   PHURINE 6.0 01/04/2017 1624   GLUCOSEU >=500 (A) 01/04/2017 1624   HGBUR NEGATIVE 01/04/2017 1624   BILIRUBINUR NEGATIVE 01/04/2017 1624   KETONESUR NEGATIVE 01/04/2017 1624   PROTEINUR 100 (A) 01/04/2017 1624   UROBILINOGEN 0.2 02/14/2015 1157   NITRITE NEGATIVE 01/04/2017 1624   LEUKOCYTESUR SMALL (A) 01/04/2017 1624   Sepsis Labs: _0 (procalcitonin:4,lacticidven:4) )No results found for this or any previous visit (from the past 240 hour(s)).   Radiological Exams on Admission: Ct Abdomen Pelvis Wo Contrast  Result Date: 01/04/2017 CLINICAL DATA:  Dizziness.  Abdominal pain with nausea and vomiting. EXAM: CT CHEST, ABDOMEN AND PELVIS WITHOUT CONTRAST TECHNIQUE: Multidetector CT imaging of the chest, abdomen and pelvis was performed following the standard protocol without IV contrast. COMPARISON:  None. FINDINGS: CT CHEST FINDINGS Cardiovascular: Cardiomegaly with small volume pericardial fluid. Enlarged main pulmonary artery at 4 cm, usually seen with pulmonary hypertension. Diffuse atherosclerotic calcification of the coronaries. Multifocal aortic and great vessel calcification. No acute vascular finding. Mediastinum/Nodes: Hilar enlargement it appears vascular. No adenopathy noted. Negative esophagus. Lungs/Pleura: Moderate layering pleural effusions, greater on the right. Multi segment atelectasis. The aerated lung shows no edema. No pneumothorax. Musculoskeletal: See below CT ABDOMEN PELVIS FINDINGS Hepatobiliary: No focal liver abnormality.Subtle layering calculi. No inflammatory changes in the biliary tree. Normal common bile duct diameter. Pancreas: Unremarkable. Spleen: Unremarkable. Adrenals/Urinary Tract: Negative adrenals. No hydronephrosis or stone. Hilar calcifications appear atherosclerotic. Unremarkable bladder. Stomach/Bowel: No obstruction. No appendicitis. Extensive sigmoid diverticulosis. Vascular/Lymphatic: Diffuse atherosclerotic  calcification. No noted adenopathy. No mass or adenopathy. Reproductive:Fibroid uterus with 4 cm calcified/ hyalinized fibroid. Other: No ascites or pneumoperitoneum.  Anasarca. Musculoskeletal: No acute finding. Severe lumbar facet arthropathy. Status post L4-5 fusion. L5-S1 ankylosis or fusion. Extensive thoracic spondylosis with multi-level ankylosis. Advanced glenohumeral osteoarthritis. Due to patient size, some of the abdominal wall is not visible. IMPRESSION: 1. Cardiomegaly with volume overload. Moderate right and small left pleural effusions with multi segment atelectasis. Extensive anasarca. 2. Probable pulmonary hypertension. 3. Extensive colonic diverticulosis. 4. Cholelithiasis. Electronically Signed   By: Monte Fantasia M.D.   On: 01/04/2017 20:04   Ct Head Wo Contrast  Result Date: 01/04/2017 CLINICAL  DATA:  Dizziness and weakness. EXAM: CT HEAD WITHOUT CONTRAST TECHNIQUE: Contiguous axial images were obtained from the base of the skull through the vertex without intravenous contrast. COMPARISON:  10/12/2013 head CT, MRI 01/13/2014 FINDINGS: Brain: Chronic moderate superficial atrophy with small vessel ischemic disease of periventricular and subcortical white matter. No acute intracranial hemorrhage, midline shift or edema. No extra-axial fluid collections. The basal cisterns and fourth ventricle are midline. Vascular: Atherosclerotic calcifications of the vertebral arteries and cavernous sinus carotids. No hyperdense vessels. Skull: Negative for fracture or focal lesions. Sinuses/Orbits: No acute finding. Other: None. IMPRESSION: Chronic moderate superficial atrophy with small vessel ischemic disease. No acute intracranial abnormality. Electronically Signed   By: Ashley Royalty M.D.   On: 01/04/2017 19:47   Ct Chest Wo Contrast  Result Date: 01/04/2017 CLINICAL DATA:  Dizziness.  Abdominal pain with nausea and vomiting. EXAM: CT CHEST, ABDOMEN AND PELVIS WITHOUT CONTRAST TECHNIQUE:  Multidetector CT imaging of the chest, abdomen and pelvis was performed following the standard protocol without IV contrast. COMPARISON:  None. FINDINGS: CT CHEST FINDINGS Cardiovascular: Cardiomegaly with small volume pericardial fluid. Enlarged main pulmonary artery at 4 cm, usually seen with pulmonary hypertension. Diffuse atherosclerotic calcification of the coronaries. Multifocal aortic and great vessel calcification. No acute vascular finding. Mediastinum/Nodes: Hilar enlargement it appears vascular. No adenopathy noted. Negative esophagus. Lungs/Pleura: Moderate layering pleural effusions, greater on the right. Multi segment atelectasis. The aerated lung shows no edema. No pneumothorax. Musculoskeletal: See below CT ABDOMEN PELVIS FINDINGS Hepatobiliary: No focal liver abnormality.Subtle layering calculi. No inflammatory changes in the biliary tree. Normal common bile duct diameter. Pancreas: Unremarkable. Spleen: Unremarkable. Adrenals/Urinary Tract: Negative adrenals. No hydronephrosis or stone. Hilar calcifications appear atherosclerotic. Unremarkable bladder. Stomach/Bowel: No obstruction. No appendicitis. Extensive sigmoid diverticulosis. Vascular/Lymphatic: Diffuse atherosclerotic calcification. No noted adenopathy. No mass or adenopathy. Reproductive:Fibroid uterus with 4 cm calcified/ hyalinized fibroid. Other: No ascites or pneumoperitoneum.  Anasarca. Musculoskeletal: No acute finding. Severe lumbar facet arthropathy. Status post L4-5 fusion. L5-S1 ankylosis or fusion. Extensive thoracic spondylosis with multi-level ankylosis. Advanced glenohumeral osteoarthritis. Due to patient size, some of the abdominal wall is not visible. IMPRESSION: 1. Cardiomegaly with volume overload. Moderate right and small left pleural effusions with multi segment atelectasis. Extensive anasarca. 2. Probable pulmonary hypertension. 3. Extensive colonic diverticulosis. 4. Cholelithiasis. Electronically Signed   By:  Monte Fantasia M.D.   On: 01/04/2017 20:04    EKG: Independently reviewed.   Assessment/Plan Active Problems:   T2_NIDDM w/CKD 3    Acute diastolic congestive heart failure (HCC)   CKD (chronic kidney disease) stage 3, GFR 30-59 ml/min   Anemia  (please populate well all problems here in Problem List. (For example, if patient is on BP meds )  #1 Acute CHF: Serial troponin 2-D echo Follow BNP Monitor I/O with daily weight Fluid restriction to <1000 daily mls Follow CXR B-Blocker Hold ACEI/ARB- Bidil Consider cardiac consult in the morning  #2 Acute on chronic CKD: Possibly related to acute CHF hemodynamic changes Cr baseline _0 .00 Hold ACEI/ARB Monitor renal fxn with electrolytes Mildly elevated troponin may be related to renal dysfunction-follow trend Consider Nephrology consult in AM  #3 Anemia: Chronic, possibly related to chronic disease However acute drop with history of recent uterine findings on MRI concerning for neoplasm is worrisome No clinical evidence of bleeding check FOBT/anemia panel-SCDs for now; consider heparin for DVT prophylaxis if no risk of bleeding. Follow up on MRI report monitor clinically  #4 DM2: Hold oral hypoglycemic(s) for now Sliding-scale insulin Consider  basal insulin as needed  #5 Transaminitis: May be related to hepatic congestion due to acute CHF Check hepatitis panel Follow clinically   DVT prophylaxis: (SCD) Code Status: (Full) Family Communication:  Disposition Plan: (To be determined) Consults called:  Admission status: (inpatient Margarita Sermons MD [facility] Pager [phone]  If 7PM-7AM, please contact night-coverage [email or web address] Password [facility]  01/04/2017, 9:21 PM

## 2017-01-05 ENCOUNTER — Observation Stay (HOSPITAL_COMMUNITY): Payer: Medicare Other

## 2017-01-05 ENCOUNTER — Inpatient Hospital Stay (HOSPITAL_COMMUNITY): Payer: Medicare Other

## 2017-01-05 ENCOUNTER — Other Ambulatory Visit: Payer: Self-pay

## 2017-01-05 DIAGNOSIS — K7689 Other specified diseases of liver: Secondary | ICD-10-CM | POA: Diagnosis not present

## 2017-01-05 DIAGNOSIS — R945 Abnormal results of liver function studies: Secondary | ICD-10-CM | POA: Diagnosis not present

## 2017-01-05 DIAGNOSIS — I509 Heart failure, unspecified: Secondary | ICD-10-CM | POA: Diagnosis not present

## 2017-01-05 DIAGNOSIS — N179 Acute kidney failure, unspecified: Secondary | ICD-10-CM | POA: Diagnosis not present

## 2017-01-05 LAB — BLOOD GAS, ARTERIAL
Acid-base deficit: 2.8 mmol/L — ABNORMAL HIGH (ref 0.0–2.0)
BICARBONATE: 22.2 mmol/L (ref 20.0–28.0)
Drawn by: 313061
FIO2: 21
O2 Saturation: 91 %
PATIENT TEMPERATURE: 98.6
PO2 ART: 63.2 mmHg — AB (ref 83.0–108.0)
pCO2 arterial: 43.2 mmHg (ref 32.0–48.0)
pH, Arterial: 7.331 — ABNORMAL LOW (ref 7.350–7.450)

## 2017-01-05 LAB — LIPID PANEL
Cholesterol: 156 mg/dL (ref 0–200)
HDL: 58 mg/dL (ref >40)
LDL CALC: 86 mg/dL (ref 0–99)
Total CHOL/HDL Ratio: 2.7 RATIO
Triglycerides: 58 mg/dL (ref <150)
VLDL: 12 mg/dL (ref 0–40)

## 2017-01-05 LAB — GLUCOSE, CAPILLARY
GLUCOSE-CAPILLARY: 217 mg/dL — AB (ref 65–99)
Glucose-Capillary: 129 mg/dL — ABNORMAL HIGH (ref 65–99)
Glucose-Capillary: 198 mg/dL — ABNORMAL HIGH (ref 65–99)
Glucose-Capillary: 271 mg/dL — ABNORMAL HIGH (ref 65–99)
Glucose-Capillary: 61 mg/dL — ABNORMAL LOW (ref 65–99)

## 2017-01-05 LAB — CBC WITH DIFFERENTIAL/PLATELET
Basophils Absolute: 0 10*3/uL (ref 0.0–0.1)
Basophils Relative: 0 %
EOS PCT: 2 %
Eosinophils Absolute: 0.1 10*3/uL (ref 0.0–0.7)
HCT: 24.8 % — ABNORMAL LOW (ref 36.0–46.0)
Hemoglobin: 7.7 g/dL — ABNORMAL LOW (ref 12.0–15.0)
LYMPHS ABS: 1.2 10*3/uL (ref 0.7–4.0)
LYMPHS PCT: 16 %
MCH: 28.8 pg (ref 26.0–34.0)
MCHC: 31 g/dL (ref 30.0–36.0)
MCV: 92.9 fL (ref 78.0–100.0)
Monocytes Absolute: 0.3 10*3/uL (ref 0.1–1.0)
Monocytes Relative: 3 %
Neutro Abs: 6 10*3/uL (ref 1.7–7.7)
Neutrophils Relative %: 79 %
PLATELETS: 204 10*3/uL (ref 150–400)
RBC: 2.67 MIL/uL — AB (ref 3.87–5.11)
RDW: 16.6 % — ABNORMAL HIGH (ref 11.5–15.5)
WBC: 7.7 10*3/uL (ref 4.0–10.5)

## 2017-01-05 LAB — COMPREHENSIVE METABOLIC PANEL
ALBUMIN: 3.4 g/dL — AB (ref 3.5–5.0)
ALT: 152 U/L — AB (ref 14–54)
AST: 53 U/L — AB (ref 15–41)
Alkaline Phosphatase: 81 U/L (ref 38–126)
Anion gap: 8 (ref 5–15)
BUN: 68 mg/dL — AB (ref 6–20)
CHLORIDE: 112 mmol/L — AB (ref 101–111)
CO2: 23 mmol/L (ref 22–32)
CREATININE: 3.65 mg/dL — AB (ref 0.44–1.00)
Calcium: 9.7 mg/dL (ref 8.9–10.3)
GFR calc Af Amer: 13 mL/min — ABNORMAL LOW (ref >60)
GFR calc non Af Amer: 11 mL/min — ABNORMAL LOW (ref >60)
Glucose, Bld: 265 mg/dL — ABNORMAL HIGH (ref 65–99)
Potassium: 4.6 mmol/L (ref 3.5–5.1)
SODIUM: 143 mmol/L (ref 135–145)
Total Bilirubin: 0.5 mg/dL (ref 0.3–1.2)
Total Protein: 5.9 g/dL — ABNORMAL LOW (ref 6.5–8.1)

## 2017-01-05 LAB — AMMONIA: Ammonia: 29 umol/L (ref 9–35)

## 2017-01-05 LAB — MAGNESIUM: MAGNESIUM: 1.6 mg/dL — AB (ref 1.7–2.4)

## 2017-01-05 LAB — ECHOCARDIOGRAM COMPLETE
Height: 63 in
Weight: 3555.2 oz

## 2017-01-05 LAB — TROPONIN I
TROPONIN I: 0.06 ng/mL — AB (ref <0.03)
Troponin I: 0.06 ng/mL (ref <0.03)

## 2017-01-05 LAB — BRAIN NATRIURETIC PEPTIDE: B Natriuretic Peptide: 1317.6 pg/mL — ABNORMAL HIGH (ref 0.0–100.0)

## 2017-01-05 LAB — PREPARE RBC (CROSSMATCH)

## 2017-01-05 LAB — TSH: TSH: 6.02 u[IU]/mL — AB (ref 0.350–4.500)

## 2017-01-05 MED ORDER — INSULIN GLARGINE 100 UNIT/ML ~~LOC~~ SOLN
8.0000 [IU] | Freq: Every day | SUBCUTANEOUS | Status: DC
  Administered 2017-01-05 – 2017-01-12 (×8): 8 [IU] via SUBCUTANEOUS
  Filled 2017-01-05 (×8): qty 0.08

## 2017-01-05 MED ORDER — MAGNESIUM SULFATE 2 GM/50ML IV SOLN
2.0000 g | Freq: Once | INTRAVENOUS | Status: AC
  Administered 2017-01-05: 2 g via INTRAVENOUS
  Filled 2017-01-05: qty 50

## 2017-01-05 MED ORDER — SODIUM CHLORIDE 0.9 % IV SOLN
Freq: Once | INTRAVENOUS | Status: AC
  Administered 2017-01-05: 16:00:00 via INTRAVENOUS

## 2017-01-05 MED ORDER — PANTOPRAZOLE SODIUM 40 MG PO TBEC
40.0000 mg | DELAYED_RELEASE_TABLET | Freq: Every day | ORAL | Status: DC
  Administered 2017-01-05 – 2017-01-06 (×2): 40 mg via ORAL
  Filled 2017-01-05 (×2): qty 1

## 2017-01-05 NOTE — Discharge Summary (Deleted)
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 female with medical history significant for but not limited to type 2 diabetes, hypertension and hyperlipidemia presenting with several days history of "feeling poorly. The last several days patient has been feeling weak with easy fatigability and mild shortness of breath on exertion without fever or chills or cough. Diarrhea 4 days ago that resolved with immodium. Patient noted to have anemia with hemoglobin of 8.3, BNP 1317, troponin slightly up but unchanged. Patient admitted for fatigue and weakness  Assessment and plan Acute on chronic CHF exacerbation  2-D echo EF: 50% -   55%, large left pleural  effusion. 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 Baseline creatinine around 2.4, creatinine was 3.54, now 3.65 Hold off on lasix , due to above  Hold off on IVF ,given anasarca, will recheck renal panel in am and make a decision  Left pleural effusion Moderate right and small left pleural effusions  On  CT chest   Anemia likely anemia of chronic disease Colonoscopy 2015 showed sessile polyps Baseline hemoglobin seems to be between  11-12 Hemoglobin 8.3 down to 7.7 today Anemia panel within normal limits FOBT negative 1, repeat FOBT   Transfuse 1 unit packed red blood cells to keep hemoglobin greater than 8.0 CT abdomen pelvis does not show any obvious etiology  Transaminitis AST ALT slightly elevated Suspect hepatic congestion from congestive heart failure Right upper quadrant ultrasound Check ammonia level  Hypomagnesemia Mg 1.6 , will replete    DVT prophylaxsis SCD  Code Status:  Full code    Family Communication: Discussed in detail with the patient, all imaging results, lab results explained to the patient   Disposition Plan:  Pending clinical improvement, 3-4 days      Consultants:  cardiology  Procedures:  None  Antibiotics: Anti-infectives    None         HPI/Subjective: Very sleepy, daughter by the bedside provides most of the history  Objective: Vitals:   01/04/17 1845 01/04/17 2156 01/04/17 2201 01/04/17 2323  BP:   (!) 178/86 (!) 182/84  Pulse: 76 97 85 87  Resp: 20 (!) 27 (!) 21 20  Temp:    98.4 F (36.9 C)  TempSrc:    Oral  SpO2: 93% 96%  92%  Weight:    100.8 kg (222 lb 3.2 oz)  Height:    5\' 3"  (1.6 m)    Intake/Output Summary (Last 24 hours) at 01/05/17 0901 Last data filed at 01/05/17 0738  Gross per 24 hour  Intake  60 ml  Output              900 ml  Net             -840 ml    Exam:  Examination:  General exam: Appears calm and comfortable  Respiratory system: Decreased breath sounds with crackles. Respiratory effort normal. Cardiovascular system: S1 & S2 heard, RRR. No JVD, murmurs, rubs, gallops or clicks. Bilateral pedal edema. Gastrointestinal system: Abdomen is nondistended, soft and nontender. No organomegaly or masses felt. Normal bowel sounds heard. Central nervous system: somnolent but arousable  Extremities: Symmetric 5 x 5 power. Skin: No rashes, lesions or ulcers Psychiatry: somnolent     Data Reviewed: I have  personally reviewed following labs and imaging studies  Micro Results No results found for this or any previous visit (from the past 240 hour(s)).  Radiology Reports Ct Abdomen Pelvis Wo Contrast  Result Date: 01/04/2017 CLINICAL DATA:  Dizziness.  Abdominal pain with nausea and vomiting. EXAM: CT CHEST, ABDOMEN AND PELVIS WITHOUT CONTRAST TECHNIQUE: Multidetector CT imaging of the chest, abdomen and pelvis was performed following the standard protocol without IV contrast. COMPARISON:  None. FINDINGS: CT CHEST FINDINGS Cardiovascular: Cardiomegaly with small volume pericardial fluid. Enlarged main pulmonary artery at 4 cm, usually seen with pulmonary hypertension. Diffuse atherosclerotic calcification of the coronaries. Multifocal aortic and great vessel calcification. No acute vascular finding. Mediastinum/Nodes: Hilar enlargement it appears vascular. No adenopathy noted. Negative esophagus. Lungs/Pleura: Moderate layering pleural effusions, greater on the right. Multi segment atelectasis. The aerated lung shows no edema. No pneumothorax. Musculoskeletal: See below CT ABDOMEN PELVIS FINDINGS Hepatobiliary: No focal liver abnormality.Subtle layering calculi. No inflammatory changes in the biliary tree. Normal common bile duct diameter. Pancreas: Unremarkable. Spleen: Unremarkable. Adrenals/Urinary Tract: Negative adrenals. No hydronephrosis or stone. Hilar calcifications appear atherosclerotic. Unremarkable bladder. Stomach/Bowel: No obstruction. No appendicitis. Extensive sigmoid diverticulosis. Vascular/Lymphatic: Diffuse atherosclerotic calcification. No noted adenopathy. No mass or adenopathy. Reproductive:Fibroid uterus with 4 cm calcified/ hyalinized fibroid. Other: No ascites or pneumoperitoneum.  Anasarca. Musculoskeletal: No acute finding. Severe lumbar facet arthropathy. Status post L4-5 fusion. L5-S1 ankylosis or fusion. Extensive thoracic spondylosis with multi-level ankylosis. Advanced  glenohumeral osteoarthritis. Due to patient size, some of the abdominal wall is not visible. IMPRESSION: 1. Cardiomegaly with volume overload. Moderate right and small left pleural effusions with multi segment atelectasis. Extensive anasarca. 2. Probable pulmonary hypertension. 3. Extensive colonic diverticulosis. 4. Cholelithiasis. Electronically Signed   By: Monte Fantasia M.D.   On: 01/04/2017 20:04   Ct Head Wo Contrast  Result Date: 01/04/2017 CLINICAL DATA:  Dizziness and weakness. EXAM: CT HEAD WITHOUT CONTRAST TECHNIQUE: Contiguous axial images were obtained from the base of the skull through the vertex without intravenous contrast. COMPARISON:  10/12/2013 head CT, MRI 01/13/2014 FINDINGS: Brain: Chronic moderate superficial atrophy with small vessel ischemic disease of periventricular and subcortical white matter. No acute intracranial hemorrhage, midline shift or edema. No extra-axial fluid collections. The basal cisterns and fourth ventricle are midline. Vascular: Atherosclerotic calcifications of the vertebral arteries and cavernous sinus carotids. No hyperdense vessels. Skull: Negative for fracture or focal lesions. Sinuses/Orbits: No acute finding. Other: None. IMPRESSION: Chronic moderate superficial atrophy with small vessel ischemic disease. No acute intracranial abnormality. Electronically Signed   By: Ashley Royalty M.D.   On: 01/04/2017 19:47   Ct Chest Wo Contrast  Result Date: 01/04/2017 CLINICAL DATA:  Dizziness.  Abdominal pain with nausea and vomiting. EXAM: CT CHEST, ABDOMEN AND PELVIS WITHOUT CONTRAST TECHNIQUE: Multidetector CT imaging  of the chest, abdomen and pelvis was performed following the standard protocol without IV contrast. COMPARISON:  None. FINDINGS: CT CHEST FINDINGS Cardiovascular: Cardiomegaly with small volume pericardial fluid. Enlarged main pulmonary artery at 4 cm, usually seen with pulmonary hypertension. Diffuse atherosclerotic calcification of the coronaries.  Multifocal aortic and great vessel calcification. No acute vascular finding. Mediastinum/Nodes: Hilar enlargement it appears vascular. No adenopathy noted. Negative esophagus. Lungs/Pleura: Moderate layering pleural effusions, greater on the right. Multi segment atelectasis. The aerated lung shows no edema. No pneumothorax. Musculoskeletal: See below CT ABDOMEN PELVIS FINDINGS Hepatobiliary: No focal liver abnormality.Subtle layering calculi. No inflammatory changes in the biliary tree. Normal common bile duct diameter. Pancreas: Unremarkable. Spleen: Unremarkable. Adrenals/Urinary Tract: Negative adrenals. No hydronephrosis or stone. Hilar calcifications appear atherosclerotic. Unremarkable bladder. Stomach/Bowel: No obstruction. No appendicitis. Extensive sigmoid diverticulosis. Vascular/Lymphatic: Diffuse atherosclerotic calcification. No noted adenopathy. No mass or adenopathy. Reproductive:Fibroid uterus with 4 cm calcified/ hyalinized fibroid. Other: No ascites or pneumoperitoneum.  Anasarca. Musculoskeletal: No acute finding. Severe lumbar facet arthropathy. Status post L4-5 fusion. L5-S1 ankylosis or fusion. Extensive thoracic spondylosis with multi-level ankylosis. Advanced glenohumeral osteoarthritis. Due to patient size, some of the abdominal wall is not visible. IMPRESSION: 1. Cardiomegaly with volume overload. Moderate right and small left pleural effusions with multi segment atelectasis. Extensive anasarca. 2. Probable pulmonary hypertension. 3. Extensive colonic diverticulosis. 4. Cholelithiasis. Electronically Signed   By: Monte Fantasia M.D.   On: 01/04/2017 20:04     CBC  Recent Labs Lab 01/04/17 1744 01/05/17 0357  WBC 7.1 7.7  HGB 8.3* 7.7*  HCT 26.2* 24.8*  PLT 233 204  MCV 92.3 92.9  MCH 29.2 28.8  MCHC 31.7 31.0  RDW 16.5* 16.6*  LYMPHSABS  --  1.2  MONOABS  --  0.3  EOSABS  --  0.1  BASOSABS  --  0.0    Chemistries   Recent Labs Lab 01/04/17 1322 01/05/17 0357   NA 143 143  K 4.5 4.6  CL 112* 112*  CO2 21* 23  GLUCOSE 237* 265*  BUN 71* 68*  CREATININE 3.54* 3.65*  CALCIUM 10.1 9.7  MG  --  1.6*  AST 81* 53*  ALT 192* 152*  ALKPHOS 88 81  BILITOT 0.6 0.5   ------------------------------------------------------------------------------------------------------------------ estimated creatinine clearance is 15.8 mL/min (A) (by C-G formula based on SCr of 3.65 mg/dL (H)). ------------------------------------------------------------------------------------------------------------------ No results for input(s): HGBA1C in the last 72 hours. ------------------------------------------------------------------------------------------------------------------  Recent Labs  01/05/17 0357  CHOL 156  HDL 58  LDLCALC 86  TRIG 58  CHOLHDL 2.7   ------------------------------------------------------------------------------------------------------------------  Recent Labs  01/05/17 0357  TSH 6.020*   ------------------------------------------------------------------------------------------------------------------  Recent Labs  01/04/17 2144  VITAMINB12 1,735*  FOLATE 47.6  FERRITIN 187  TIBC 273  IRON 40  RETICCTPCT 1.3    Coagulation profile No results for input(s): INR, PROTIME in the last 168 hours.  No results for input(s): DDIMER in the last 72 hours.  Cardiac Enzymes  Recent Labs Lab 01/04/17 1744 01/04/17 2144 01/05/17 0357  TROPONINI 0.06* 0.07* 0.06*   ------------------------------------------------------------------------------------------------------------------ Invalid input(s): POCBNP   CBG:  Recent Labs Lab 01/05/17 0009 01/05/17 0732  GLUCAP 271* 198*       Studies: Ct Abdomen Pelvis Wo Contrast  Result Date: 01/04/2017 CLINICAL DATA:  Dizziness.  Abdominal pain with nausea and vomiting. EXAM: CT CHEST, ABDOMEN AND PELVIS WITHOUT CONTRAST TECHNIQUE: Multidetector CT imaging of the chest, abdomen  and pelvis was performed following the standard protocol without IV contrast. COMPARISON:  None.  FINDINGS: CT CHEST FINDINGS Cardiovascular: Cardiomegaly with small volume pericardial fluid. Enlarged main pulmonary artery at 4 cm, usually seen with pulmonary hypertension. Diffuse atherosclerotic calcification of the coronaries. Multifocal aortic and great vessel calcification. No acute vascular finding. Mediastinum/Nodes: Hilar enlargement it appears vascular. No adenopathy noted. Negative esophagus. Lungs/Pleura: Moderate layering pleural effusions, greater on the right. Multi segment atelectasis. The aerated lung shows no edema. No pneumothorax. Musculoskeletal: See below CT ABDOMEN PELVIS FINDINGS Hepatobiliary: No focal liver abnormality.Subtle layering calculi. No inflammatory changes in the biliary tree. Normal common bile duct diameter. Pancreas: Unremarkable. Spleen: Unremarkable. Adrenals/Urinary Tract: Negative adrenals. No hydronephrosis or stone. Hilar calcifications appear atherosclerotic. Unremarkable bladder. Stomach/Bowel: No obstruction. No appendicitis. Extensive sigmoid diverticulosis. Vascular/Lymphatic: Diffuse atherosclerotic calcification. No noted adenopathy. No mass or adenopathy. Reproductive:Fibroid uterus with 4 cm calcified/ hyalinized fibroid. Other: No ascites or pneumoperitoneum.  Anasarca. Musculoskeletal: No acute finding. Severe lumbar facet arthropathy. Status post L4-5 fusion. L5-S1 ankylosis or fusion. Extensive thoracic spondylosis with multi-level ankylosis. Advanced glenohumeral osteoarthritis. Due to patient size, some of the abdominal wall is not visible. IMPRESSION: 1. Cardiomegaly with volume overload. Moderate right and small left pleural effusions with multi segment atelectasis. Extensive anasarca. 2. Probable pulmonary hypertension. 3. Extensive colonic diverticulosis. 4. Cholelithiasis. Electronically Signed   By: Monte Fantasia M.D.   On: 01/04/2017 20:04   Ct  Head Wo Contrast  Result Date: 01/04/2017 CLINICAL DATA:  Dizziness and weakness. EXAM: CT HEAD WITHOUT CONTRAST TECHNIQUE: Contiguous axial images were obtained from the base of the skull through the vertex without intravenous contrast. COMPARISON:  10/12/2013 head CT, MRI 01/13/2014 FINDINGS: Brain: Chronic moderate superficial atrophy with small vessel ischemic disease of periventricular and subcortical white matter. No acute intracranial hemorrhage, midline shift or edema. No extra-axial fluid collections. The basal cisterns and fourth ventricle are midline. Vascular: Atherosclerotic calcifications of the vertebral arteries and cavernous sinus carotids. No hyperdense vessels. Skull: Negative for fracture or focal lesions. Sinuses/Orbits: No acute finding. Other: None. IMPRESSION: Chronic moderate superficial atrophy with small vessel ischemic disease. No acute intracranial abnormality. Electronically Signed   By: Ashley Royalty M.D.   On: 01/04/2017 19:47   Ct Chest Wo Contrast  Result Date: 01/04/2017 CLINICAL DATA:  Dizziness.  Abdominal pain with nausea and vomiting. EXAM: CT CHEST, ABDOMEN AND PELVIS WITHOUT CONTRAST TECHNIQUE: Multidetector CT imaging of the chest, abdomen and pelvis was performed following the standard protocol without IV contrast. COMPARISON:  None. FINDINGS: CT CHEST FINDINGS Cardiovascular: Cardiomegaly with small volume pericardial fluid. Enlarged main pulmonary artery at 4 cm, usually seen with pulmonary hypertension. Diffuse atherosclerotic calcification of the coronaries. Multifocal aortic and great vessel calcification. No acute vascular finding. Mediastinum/Nodes: Hilar enlargement it appears vascular. No adenopathy noted. Negative esophagus. Lungs/Pleura: Moderate layering pleural effusions, greater on the right. Multi segment atelectasis. The aerated lung shows no edema. No pneumothorax. Musculoskeletal: See below CT ABDOMEN PELVIS FINDINGS Hepatobiliary: No focal liver  abnormality.Subtle layering calculi. No inflammatory changes in the biliary tree. Normal common bile duct diameter. Pancreas: Unremarkable. Spleen: Unremarkable. Adrenals/Urinary Tract: Negative adrenals. No hydronephrosis or stone. Hilar calcifications appear atherosclerotic. Unremarkable bladder. Stomach/Bowel: No obstruction. No appendicitis. Extensive sigmoid diverticulosis. Vascular/Lymphatic: Diffuse atherosclerotic calcification. No noted adenopathy. No mass or adenopathy. Reproductive:Fibroid uterus with 4 cm calcified/ hyalinized fibroid. Other: No ascites or pneumoperitoneum.  Anasarca. Musculoskeletal: No acute finding. Severe lumbar facet arthropathy. Status post L4-5 fusion. L5-S1 ankylosis or fusion. Extensive thoracic spondylosis with multi-level ankylosis. Advanced glenohumeral osteoarthritis. Due to patient size, some of the abdominal  wall is not visible. IMPRESSION: 1. Cardiomegaly with volume overload. Moderate right and small left pleural effusions with multi segment atelectasis. Extensive anasarca. 2. Probable pulmonary hypertension. 3. Extensive colonic diverticulosis. 4. Cholelithiasis. Electronically Signed   By: Monte Fantasia M.D.   On: 01/04/2017 20:04      Lab Results  Component Value Date   HGBA1C 8.0 (H) 09/19/2016   HGBA1C 8.4 (H) 06/19/2016   HGBA1C 7.8 (H) 02/21/2016   Lab Results  Component Value Date   MICROALBUR 28.4 10/26/2015   LDLCALC 86 01/05/2017   CREATININE 3.65 (H) 01/05/2017       Scheduled Meds: . aspirin  81 mg Oral Daily  . atorvastatin  80 mg Oral q1800  . carvedilol  3.125 mg Oral BID WC  . cholecalciferol  5,000 Units Oral QPM  . furosemide  40 mg Intravenous Q12H  . insulin aspart  0-15 Units Subcutaneous TID WC  . insulin aspart  0-5 Units Subcutaneous QHS  . isosorbide-hydrALAZINE  1 tablet Oral TID  . latanoprost  1 drop Both Eyes QPM  . levothyroxine  50 mcg Oral QAC breakfast  . minoxidil  5 mg Oral Daily  . multivitamin with  minerals  1 tablet Oral Daily  . omega-3 acid ethyl esters  1 g Oral Daily  . sertraline  100 mg Oral Daily  . sodium chloride flush  3 mL Intravenous Q12H  . timolol  1 drop Both Eyes BID  . vitamin B-12  1,000 mcg Oral QODAY   Continuous Infusions: . sodium chloride       LOS: 1 day    Time spent: >30 MINS    Reyne Dumas  [facility] Pager [phone]. If 7PM-7AM, please contact night-coverage at [email or web address], password [facility] 01/05/2017, 9:01 AM  LOS: 1 day

## 2017-01-05 NOTE — Progress Notes (Signed)
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 female with medical history significant for but not limited to type 2 diabetes, hypertension and hyperlipidemia presenting with several days history of "feeling poorly. The last several days patient has been feeling weak with easy fatigability and mild shortness of breath on exertion without fever or chills or cough. Diarrhea 4 days ago that resolved with immodium. Patient noted to have anemia with hemoglobin of 8.3, BNP 1317, troponin slightly up but unchanged. Patient admitted for fatigue and weakness  Assessment and plan Acute on chronic CHF exacerbation  2-D echo EF: 50% -   55%, large left pleural  effusion. 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 Baseline creatinine around 2.4, creatinine was 3.54, now 3.65 Hold off on lasix , due to above  Hold off on IVF ,given anasarca, will recheck renal panel in am and make a decision  Left pleural effusion Moderate right and small left pleural effusions  On  CT chest   Anemia likely anemia of chronic disease Colonoscopy 2015 showed sessile polyps Baseline hemoglobin seems to be between  11-12 Hemoglobin 8.3 down to 7.7 today Anemia panel within normal limits FOBT negative 1, repeat FOBT   Transfuse 1 unit packed red blood cells to keep hemoglobin greater than 8.0 CT abdomen pelvis does not show any obvious etiology  Transaminitis AST ALT slightly elevated Suspect hepatic congestion from congestive heart failure Right upper quadrant ultrasound Check ammonia level  Hypomagnesemia Mg 1.6 , will replete    DVT prophylaxsis SCD  Code Status:  Full code    Family Communication: Discussed in detail with the patient, all imaging results, lab results explained to the patient   Disposition Plan:  Pending clinical improvement, 3-4 days      Consultants:  cardiology  Procedures:  None  Antibiotics: Anti-infectives    None         HPI/Subjective: Very sleepy, daughter by the bedside provides most of the history  Objective: Vitals:   01/05/17 1200 01/05/17 1615 01/05/17 1645 01/05/17 1732  BP: (!) 144/68 134/71 (!) 118/57   Pulse: 69 63 62 65  Resp: 18 18 18    Temp: 97.7 F (36.5 C) 97.5 F (36.4 C) 97.8 F (36.6 C)   TempSrc: Oral Oral Oral   SpO2: 96% 92%    Weight:      Height:        Intake/Output Summary (Last 24 hours) at 01/05/17 1802 Last data filed at 01/05/17 1149  Gross [name] 24 hour  Intake               60 ml  Output             1500 ml  Net            -1440 ml    Exam:  Examination:  General exam: Appears calm and comfortable  Respiratory system: Decreased breath sounds with crackles. Respiratory effort normal. Cardiovascular system: S1 & S2 heard, RRR. No JVD, murmurs, rubs, gallops or clicks. Bilateral pedal edema. Gastrointestinal system: Abdomen is nondistended, soft and nontender. No organomegaly or masses felt. Normal bowel sounds heard. Central nervous system: somnolent but arousable  Extremities: Symmetric 5 x 5 power. Skin: No rashes, lesions or ulcers Psychiatry: somnolent     Data Reviewed: I have  personally reviewed following labs and imaging studies  Micro Results No results found for this or any previous visit (from the past 240 hour(s)).  Radiology Reports Ct Abdomen Pelvis Wo Contrast  Result Date: 01/04/2017 CLINICAL DATA:  Dizziness.  Abdominal pain with nausea and vomiting. EXAM: CT CHEST, ABDOMEN AND PELVIS WITHOUT CONTRAST TECHNIQUE: Multidetector CT imaging of the chest, abdomen and pelvis was performed following the standard protocol without IV contrast. COMPARISON:  None. FINDINGS: CT CHEST FINDINGS Cardiovascular: Cardiomegaly with small volume pericardial fluid. Enlarged main pulmonary artery at 4 cm, usually seen with pulmonary hypertension. Diffuse atherosclerotic calcification of the coronaries. Multifocal aortic and great vessel calcification. No acute vascular finding. Mediastinum/Nodes: Hilar enlargement it appears vascular. No adenopathy noted. Negative esophagus. Lungs/Pleura: Moderate layering pleural effusions, greater on the right. Multi segment atelectasis. The aerated lung shows no edema. No pneumothorax. Musculoskeletal: See below CT ABDOMEN PELVIS FINDINGS Hepatobiliary: No focal liver abnormality.Subtle layering calculi. No inflammatory changes in the biliary tree. Normal common bile duct diameter. Pancreas: Unremarkable. Spleen: Unremarkable. Adrenals/Urinary Tract: Negative adrenals. No hydronephrosis or stone. Hilar calcifications appear atherosclerotic. Unremarkable bladder. Stomach/Bowel: No obstruction. No appendicitis. Extensive sigmoid diverticulosis. Vascular/Lymphatic: Diffuse atherosclerotic calcification. No noted adenopathy. No mass or adenopathy. Reproductive:Fibroid uterus with 4 cm calcified/ hyalinized fibroid. Other: No ascites or pneumoperitoneum.  Anasarca. Musculoskeletal: No acute finding. Severe lumbar facet arthropathy. Status post L4-5 fusion. L5-S1 ankylosis or fusion. Extensive thoracic spondylosis with multi-level ankylosis. Advanced  glenohumeral osteoarthritis. Due to patient size, some of the abdominal wall is not visible. IMPRESSION: 1. Cardiomegaly with volume overload. Moderate right and small left pleural effusions with multi segment atelectasis. Extensive anasarca. 2. Probable pulmonary hypertension. 3. Extensive colonic diverticulosis. 4. Cholelithiasis. Electronically Signed   By: Monte Fantasia M.D.   On: 01/04/2017 20:04   Ct Head Wo Contrast  Result Date: 01/04/2017 CLINICAL DATA:  Dizziness and weakness. EXAM: CT HEAD WITHOUT CONTRAST TECHNIQUE: Contiguous axial images were obtained from the base of the skull through the vertex without intravenous contrast. COMPARISON:  10/12/2013 head CT, MRI 01/13/2014 FINDINGS: Brain: Chronic moderate superficial atrophy with small vessel ischemic disease of periventricular and subcortical white matter. No acute intracranial hemorrhage, midline shift or edema. No extra-axial fluid collections. The basal cisterns and fourth ventricle are midline. Vascular: Atherosclerotic calcifications of the vertebral arteries and cavernous sinus carotids. No hyperdense vessels. Skull: Negative for fracture or focal lesions. Sinuses/Orbits: No acute finding. Other: None. IMPRESSION: Chronic moderate superficial atrophy with small vessel ischemic disease. No acute intracranial abnormality. Electronically Signed   By: Ashley Royalty M.D.   On: 01/04/2017 19:47   Ct Chest Wo Contrast  Result Date: 01/04/2017 CLINICAL DATA:  Dizziness.  Abdominal pain with nausea and vomiting. EXAM: CT CHEST, ABDOMEN AND PELVIS WITHOUT CONTRAST TECHNIQUE: Multidetector CT imaging of the chest, abdomen and  pelvis was performed following the standard protocol without IV contrast. COMPARISON:  None. FINDINGS: CT CHEST FINDINGS Cardiovascular: Cardiomegaly with small volume pericardial fluid. Enlarged main pulmonary artery at 4 cm, usually seen with pulmonary hypertension. Diffuse atherosclerotic calcification of the coronaries.  Multifocal aortic and great vessel calcification. No acute vascular finding. Mediastinum/Nodes: Hilar enlargement it appears vascular. No adenopathy noted. Negative esophagus. Lungs/Pleura: Moderate layering pleural effusions, greater on the right. Multi segment atelectasis. The aerated lung shows no edema. No pneumothorax. Musculoskeletal: See below CT ABDOMEN PELVIS FINDINGS Hepatobiliary: No focal liver abnormality.Subtle layering calculi. No inflammatory changes in the biliary tree. Normal common bile duct diameter. Pancreas: Unremarkable. Spleen: Unremarkable. Adrenals/Urinary Tract: Negative adrenals. No hydronephrosis or stone. Hilar calcifications appear atherosclerotic. Unremarkable bladder. Stomach/Bowel: No obstruction. No appendicitis. Extensive sigmoid diverticulosis. Vascular/Lymphatic: Diffuse atherosclerotic calcification. No noted adenopathy. No mass or adenopathy. Reproductive:Fibroid uterus with 4 cm calcified/ hyalinized fibroid. Other: No ascites or pneumoperitoneum.  Anasarca. Musculoskeletal: No acute finding. Severe lumbar facet arthropathy. Status post L4-5 fusion. L5-S1 ankylosis or fusion. Extensive thoracic spondylosis with multi-level ankylosis. Advanced glenohumeral osteoarthritis. Due to patient size, some of the abdominal wall is not visible. IMPRESSION: 1. Cardiomegaly with volume overload. Moderate right and small left pleural effusions with multi segment atelectasis. Extensive anasarca. 2. Probable pulmonary hypertension. 3. Extensive colonic diverticulosis. 4. Cholelithiasis. Electronically Signed   By: Monte Fantasia M.D.   On: 01/04/2017 20:04     CBC  Recent Labs Lab 01/04/17 1744 01/05/17 0357  WBC 7.1 7.7  HGB 8.3* 7.7*  HCT 26.2* 24.8*  PLT 233 204  MCV 92.3 92.9  MCH 29.2 28.8  MCHC 31.7 31.0  RDW 16.5* 16.6*  LYMPHSABS  --  1.2  MONOABS  --  0.3  EOSABS  --  0.1  BASOSABS  --  0.0    Chemistries   Recent Labs Lab 01/04/17 1322 01/05/17 0357   NA 143 143  K 4.5 4.6  CL 112* 112*  CO2 21* 23  GLUCOSE 237* 265*  BUN 71* 68*  CREATININE 3.54* 3.65*  CALCIUM 10.1 9.7  MG  --  1.6*  AST 81* 53*  ALT 192* 152*  ALKPHOS 88 81  BILITOT 0.6 0.5   ------------------------------------------------------------------------------------------------------------------ estimated creatinine clearance is 15.8 mL/min (A) (by C-G formula based on SCr of 3.65 mg/dL (H)). ------------------------------------------------------------------------------------------------------------------ No results for input(s): HGBA1C in the last 72 hours. ------------------------------------------------------------------------------------------------------------------  Recent Labs  01/05/17 0357  CHOL 156  HDL 58  LDLCALC 86  TRIG 58  CHOLHDL 2.7   ------------------------------------------------------------------------------------------------------------------  Recent Labs  01/05/17 0357  TSH 6.020*   ------------------------------------------------------------------------------------------------------------------  Recent Labs  01/04/17 2144  VITAMINB12 1,735*  FOLATE 47.6  FERRITIN 187  TIBC 273  IRON 40  RETICCTPCT 1.3    Coagulation profile No results for input(s): INR, PROTIME in the last 168 hours.  No results for input(s): DDIMER in the last 72 hours.  Cardiac Enzymes  Recent Labs Lab 01/04/17 2144 01/05/17 0357 01/05/17 1116  TROPONINI 0.07* 0.06* 0.06*   ------------------------------------------------------------------------------------------------------------------ Invalid input(s): POCBNP   CBG:  Recent Labs Lab 01/05/17 0009 01/05/17 0732 01/05/17 1112 01/05/17 1636  GLUCAP 271* 198* 217* 61*       Studies: Ct Abdomen Pelvis Wo Contrast  Result Date: 01/04/2017 CLINICAL DATA:  Dizziness.  Abdominal pain with nausea and vomiting. EXAM: CT CHEST, ABDOMEN AND PELVIS WITHOUT CONTRAST TECHNIQUE:  Multidetector CT imaging of the chest, abdomen and pelvis was performed following the standard protocol without IV contrast. COMPARISON:  None. FINDINGS: CT CHEST FINDINGS Cardiovascular: Cardiomegaly with small volume pericardial fluid. Enlarged main pulmonary artery at 4 cm, usually seen with pulmonary hypertension. Diffuse atherosclerotic calcification of the coronaries. Multifocal aortic and great vessel calcification. No acute vascular finding. Mediastinum/Nodes: Hilar enlargement it appears vascular. No adenopathy noted. Negative esophagus. Lungs/Pleura: Moderate layering pleural effusions, greater on the right. Multi segment atelectasis. The aerated lung shows no edema. No pneumothorax. Musculoskeletal: See below CT ABDOMEN PELVIS FINDINGS Hepatobiliary: No focal liver abnormality.Subtle layering calculi. No inflammatory changes in the biliary tree. Normal common bile duct diameter. Pancreas: Unremarkable. Spleen: Unremarkable. Adrenals/Urinary Tract: Negative adrenals. No hydronephrosis or stone. Hilar calcifications appear atherosclerotic. Unremarkable bladder. Stomach/Bowel: No obstruction. No appendicitis. Extensive sigmoid diverticulosis. Vascular/Lymphatic: Diffuse atherosclerotic calcification. No noted adenopathy. No mass or adenopathy. Reproductive:Fibroid uterus with 4 cm calcified/ hyalinized fibroid. Other: No ascites or pneumoperitoneum.  Anasarca. Musculoskeletal: No acute finding. Severe lumbar facet arthropathy. Status post L4-5 fusion. L5-S1 ankylosis or fusion. Extensive thoracic spondylosis with multi-level ankylosis. Advanced glenohumeral osteoarthritis. Due to patient size, some of the abdominal wall is not visible. IMPRESSION: 1. Cardiomegaly with volume overload. Moderate right and small left pleural effusions with multi segment atelectasis. Extensive anasarca. 2. Probable pulmonary hypertension. 3. Extensive colonic diverticulosis. 4. Cholelithiasis. Electronically Signed   By:  Monte Fantasia M.D.   On: 01/04/2017 20:04   Ct Head Wo Contrast  Result Date: 01/04/2017 CLINICAL DATA:  Dizziness and weakness. EXAM: CT HEAD WITHOUT CONTRAST TECHNIQUE: Contiguous axial images were obtained from the base of the skull through the vertex without intravenous contrast. COMPARISON:  10/12/2013 head CT, MRI 01/13/2014 FINDINGS: Brain: Chronic moderate superficial atrophy with small vessel ischemic disease of periventricular and subcortical white matter. No acute intracranial hemorrhage, midline shift or edema. No extra-axial fluid collections. The basal cisterns and fourth ventricle are midline. Vascular: Atherosclerotic calcifications of the vertebral arteries and cavernous sinus carotids. No hyperdense vessels. Skull: Negative for fracture or focal lesions. Sinuses/Orbits: No acute finding. Other: None. IMPRESSION: Chronic moderate superficial atrophy with small vessel ischemic disease. No acute intracranial abnormality. Electronically Signed   By: Ashley Royalty M.D.   On: 01/04/2017 19:47   Ct Chest Wo Contrast  Result Date: 01/04/2017 CLINICAL DATA:  Dizziness.  Abdominal pain with nausea and vomiting. EXAM: CT CHEST, ABDOMEN AND PELVIS WITHOUT CONTRAST TECHNIQUE: Multidetector CT imaging of the chest, abdomen and pelvis was performed following the standard protocol without IV contrast. COMPARISON:  None. FINDINGS: CT CHEST FINDINGS Cardiovascular: Cardiomegaly with small volume pericardial fluid. Enlarged main pulmonary artery at 4 cm, usually seen with pulmonary hypertension. Diffuse atherosclerotic calcification of the coronaries. Multifocal aortic and great vessel calcification. No acute vascular finding. Mediastinum/Nodes: Hilar enlargement it appears vascular. No adenopathy noted. Negative esophagus. Lungs/Pleura: Moderate layering pleural effusions, greater on the right. Multi segment atelectasis. The aerated lung shows no edema. No pneumothorax. Musculoskeletal: See below CT ABDOMEN  PELVIS FINDINGS Hepatobiliary: No focal liver abnormality.Subtle layering calculi. No inflammatory changes in the biliary tree. Normal common bile duct diameter. Pancreas: Unremarkable. Spleen: Unremarkable. Adrenals/Urinary Tract: Negative adrenals. No hydronephrosis or stone. Hilar calcifications appear atherosclerotic. Unremarkable bladder. Stomach/Bowel: No obstruction. No appendicitis. Extensive sigmoid diverticulosis. Vascular/Lymphatic: Diffuse atherosclerotic calcification. No noted adenopathy. No mass or adenopathy. Reproductive:Fibroid uterus with 4 cm calcified/ hyalinized fibroid. Other: No ascites or pneumoperitoneum.  Anasarca. Musculoskeletal: No acute finding. Severe lumbar facet arthropathy. Status post L4-5 fusion. L5-S1 ankylosis or fusion. Extensive thoracic spondylosis with multi-level ankylosis. Advanced glenohumeral osteoarthritis. Due to patient size, some of the  abdominal wall is not visible. IMPRESSION: 1. Cardiomegaly with volume overload. Moderate right and small left pleural effusions with multi segment atelectasis. Extensive anasarca. 2. Probable pulmonary hypertension. 3. Extensive colonic diverticulosis. 4. Cholelithiasis. Electronically Signed   By: Monte Fantasia M.D.   On: 01/04/2017 20:04      Lab Results  Component Value Date   HGBA1C 8.0 (H) 09/19/2016   HGBA1C 8.4 (H) 06/19/2016   HGBA1C 7.8 (H) 02/21/2016   Lab Results  Component Value Date   MICROALBUR 28.4 10/26/2015   LDLCALC 86 01/05/2017   CREATININE 3.65 (H) 01/05/2017       Scheduled Meds: . aspirin  81 mg Oral Daily  . atorvastatin  80 mg Oral q1800  . carvedilol  3.125 mg Oral BID WC  . cholecalciferol  5,000 Units Oral QPM  . insulin aspart  0-15 Units Subcutaneous TID WC  . insulin aspart  0-5 Units Subcutaneous QHS  . insulin glargine  8 Units Subcutaneous Daily  . isosorbide-hydrALAZINE  1 tablet Oral TID  . latanoprost  1 drop Both Eyes QPM  . levothyroxine  50 mcg Oral QAC  breakfast  . minoxidil  5 mg Oral Daily  . multivitamin with minerals  1 tablet Oral Daily  . omega-3 acid ethyl esters  1 g Oral Daily  . pantoprazole  40 mg Oral Daily  . sertraline  100 mg Oral Daily  . sodium chloride flush  3 mL Intravenous Q12H  . timolol  1 drop Both Eyes BID  . vitamin B-12  1,000 mcg Oral QODAY   Continuous Infusions: . sodium chloride       LOS: 1 day    Time spent: >30 MINS    Reyne Dumas  [facility] Pager [phone]. If 7PM-7AM, please contact night-coverage at [email or web address], password [facility] 01/05/2017, 6:02 PM  LOS: 1 day

## 2017-01-05 NOTE — Progress Notes (Signed)
Daughter upset that patient keeps getting stuck for various labs and her left arm where the IV is is hurting, wants a picc line.  I explained to daughter that picc lines are usually limited to prolonged intravenous needs however daughter states that it is "not fair" to keep sticking her mother.  Notified Dr. Allyson Sabal of above.  Also daughter says confusion that patient is having is new, "she was not like this yesterday". Did receive Ambien last night which she does not usually take, Ambien DC'd by Dr. Allyson Sabal.

## 2017-01-05 NOTE — Care Management Obs Status (Signed)
[place] NOTIFICATION   Patient Details  Name: SHIANA RAPPLEYE MRN: [id number] Date of Birth: July 04, 1946   Medicare Observation Status Notification Given:  Yes    Dellie Catholic, RN 01/05/2017, 3:07 PM

## 2017-01-05 NOTE — Progress Notes (Signed)
Patient in much better spirits after completing her abdominal ultrasound and getting to eat.  Sat up in chair and ate supper with daughter at bedside.  Completed one unit of blood this shift.  Patient relates that "this (her health) is my fault" because she has not taken care of herself after her husbands death in 05-03-2016.  Patient also relays that today is his birthday.  Patient alert and oriented x 4 at this time, forgetful of some details.  Remains on room air.

## 2017-01-05 NOTE — Plan of Care (Signed)
Problem: Safety: Goal: Ability to remain free from injury will improve Outcome: Progressing Up with walker   Problem: Tissue Perfusion: Goal: Risk factors for ineffective tissue perfusion will decrease Outcome: Progressing Maintains oxygen saturation in the 90's on room air

## 2017-01-05 NOTE — Progress Notes (Signed)
  Echocardiogram 2D Echocardiogram has been performed.  Alyssa [name] 01/05/2017, 9:43 AM

## 2017-01-05 NOTE — Care Management CC44 (Signed)
Condition Code 44 Documentation Completed  Patient Details  Name: Alyssa [name] MRN: [id number] Date of Birth: Jul 29, 1945   Condition Code 44 given:  Yes Patient signature on Condition Code 44 notice:  Yes Documentation of 2 MD's agreement:  Yes Code 44 added to claim:  Yes    Dellie Catholic, RN 01/05/2017, 3:07 PM

## 2017-01-06 ENCOUNTER — Encounter (HOSPITAL_COMMUNITY): Payer: Self-pay | Admitting: Physician Assistant

## 2017-01-06 DIAGNOSIS — I10 Essential (primary) hypertension: Secondary | ICD-10-CM | POA: Diagnosis not present

## 2017-01-06 DIAGNOSIS — N186 End stage renal disease: Secondary | ICD-10-CM | POA: Diagnosis not present

## 2017-01-06 DIAGNOSIS — Z48813 Encounter for surgical aftercare following surgery on the respiratory system: Secondary | ICD-10-CM | POA: Diagnosis not present

## 2017-01-06 DIAGNOSIS — E1165 Type 2 diabetes mellitus with hyperglycemia: Secondary | ICD-10-CM | POA: Diagnosis present

## 2017-01-06 DIAGNOSIS — J90 Pleural effusion, not elsewhere classified: Secondary | ICD-10-CM | POA: Diagnosis not present

## 2017-01-06 DIAGNOSIS — I120 Hypertensive chronic kidney disease with stage 5 chronic kidney disease or end stage renal disease: Secondary | ICD-10-CM | POA: Diagnosis not present

## 2017-01-06 DIAGNOSIS — M109 Gout, unspecified: Secondary | ICD-10-CM | POA: Diagnosis present

## 2017-01-06 DIAGNOSIS — H409 Unspecified glaucoma: Secondary | ICD-10-CM | POA: Diagnosis present

## 2017-01-06 DIAGNOSIS — I5021 Acute systolic (congestive) heart failure: Secondary | ICD-10-CM | POA: Diagnosis not present

## 2017-01-06 DIAGNOSIS — D631 Anemia in chronic kidney disease: Secondary | ICD-10-CM | POA: Diagnosis present

## 2017-01-06 DIAGNOSIS — R748 Abnormal levels of other serum enzymes: Secondary | ICD-10-CM | POA: Diagnosis present

## 2017-01-06 DIAGNOSIS — E119 Type 2 diabetes mellitus without complications: Secondary | ICD-10-CM | POA: Diagnosis not present

## 2017-01-06 DIAGNOSIS — E1121 Type 2 diabetes mellitus with diabetic nephropathy: Secondary | ICD-10-CM

## 2017-01-06 DIAGNOSIS — Z794 Long term (current) use of insulin: Secondary | ICD-10-CM | POA: Diagnosis not present

## 2017-01-06 DIAGNOSIS — N185 Chronic kidney disease, stage 5: Secondary | ICD-10-CM

## 2017-01-06 DIAGNOSIS — G8929 Other chronic pain: Secondary | ICD-10-CM | POA: Diagnosis present

## 2017-01-06 DIAGNOSIS — E039 Hypothyroidism, unspecified: Secondary | ICD-10-CM | POA: Diagnosis present

## 2017-01-06 DIAGNOSIS — N183 Chronic kidney disease, stage 3 (moderate): Secondary | ICD-10-CM | POA: Diagnosis present

## 2017-01-06 DIAGNOSIS — N179 Acute kidney failure, unspecified: Secondary | ICD-10-CM | POA: Diagnosis not present

## 2017-01-06 DIAGNOSIS — K7689 Other specified diseases of liver: Secondary | ICD-10-CM | POA: Diagnosis not present

## 2017-01-06 DIAGNOSIS — I130 Hypertensive heart and chronic kidney disease with heart failure and stage 1 through stage 4 chronic kidney disease, or unspecified chronic kidney disease: Secondary | ICD-10-CM | POA: Diagnosis present

## 2017-01-06 DIAGNOSIS — Z6841 Body Mass Index (BMI) 40.0 and over, adult: Secondary | ICD-10-CM | POA: Diagnosis not present

## 2017-01-06 DIAGNOSIS — E8770 Fluid overload, unspecified: Secondary | ICD-10-CM | POA: Diagnosis not present

## 2017-01-06 DIAGNOSIS — E785 Hyperlipidemia, unspecified: Secondary | ICD-10-CM | POA: Diagnosis present

## 2017-01-06 DIAGNOSIS — I5031 Acute diastolic (congestive) heart failure: Secondary | ICD-10-CM

## 2017-01-06 DIAGNOSIS — M810 Age-related osteoporosis without current pathological fracture: Secondary | ICD-10-CM | POA: Diagnosis present

## 2017-01-06 DIAGNOSIS — I509 Heart failure, unspecified: Secondary | ICD-10-CM | POA: Diagnosis not present

## 2017-01-06 DIAGNOSIS — R740 Nonspecific elevation of levels of transaminase and lactic acid dehydrogenase [LDH]: Secondary | ICD-10-CM | POA: Diagnosis present

## 2017-01-06 DIAGNOSIS — N170 Acute kidney failure with tubular necrosis: Secondary | ICD-10-CM | POA: Diagnosis not present

## 2017-01-06 DIAGNOSIS — E1122 Type 2 diabetes mellitus with diabetic chronic kidney disease: Secondary | ICD-10-CM | POA: Diagnosis present

## 2017-01-06 DIAGNOSIS — F319 Bipolar disorder, unspecified: Secondary | ICD-10-CM | POA: Diagnosis present

## 2017-01-06 DIAGNOSIS — M549 Dorsalgia, unspecified: Secondary | ICD-10-CM | POA: Diagnosis present

## 2017-01-06 DIAGNOSIS — I272 Pulmonary hypertension, unspecified: Secondary | ICD-10-CM | POA: Diagnosis present

## 2017-01-06 DIAGNOSIS — Z961 Presence of intraocular lens: Secondary | ICD-10-CM | POA: Diagnosis present

## 2017-01-06 DIAGNOSIS — I5033 Acute on chronic diastolic (congestive) heart failure: Secondary | ICD-10-CM | POA: Diagnosis not present

## 2017-01-06 DIAGNOSIS — Z7984 Long term (current) use of oral hypoglycemic drugs: Secondary | ICD-10-CM | POA: Diagnosis not present

## 2017-01-06 DIAGNOSIS — K5900 Constipation, unspecified: Secondary | ICD-10-CM | POA: Diagnosis not present

## 2017-01-06 LAB — COMPREHENSIVE METABOLIC PANEL
ALBUMIN: 3.3 g/dL — AB (ref 3.5–5.0)
ALK PHOS: 73 U/L (ref 38–126)
ALT: 115 U/L — AB (ref 14–54)
AST: 39 U/L (ref 15–41)
Anion gap: 10 (ref 5–15)
BUN: 67 mg/dL — ABNORMAL HIGH (ref 6–20)
CHLORIDE: 114 mmol/L — AB (ref 101–111)
CO2: 21 mmol/L — ABNORMAL LOW (ref 22–32)
CREATININE: 3.84 mg/dL — AB (ref 0.44–1.00)
Calcium: 9.7 mg/dL (ref 8.9–10.3)
GFR calc non Af Amer: 11 mL/min — ABNORMAL LOW (ref >60)
GFR, EST AFRICAN AMERICAN: 13 mL/min — AB (ref >60)
GLUCOSE: 153 mg/dL — AB (ref 65–99)
Potassium: 4.4 mmol/L (ref 3.5–5.1)
SODIUM: 145 mmol/L (ref 135–145)
Total Bilirubin: 0.6 mg/dL (ref 0.3–1.2)
Total Protein: 6 g/dL — ABNORMAL LOW (ref 6.5–8.1)

## 2017-01-06 LAB — HEMOGLOBIN A1C
Hgb A1c MFr Bld: 7.4 % — ABNORMAL HIGH (ref 4.8–5.6)
Mean Plasma Glucose: 166 mg/dL

## 2017-01-06 LAB — GLUCOSE, CAPILLARY
GLUCOSE-CAPILLARY: 139 mg/dL — AB (ref 65–99)
GLUCOSE-CAPILLARY: 129 mg/dL — AB (ref 65–99)
GLUCOSE-CAPILLARY: 178 mg/dL — AB (ref 65–99)
GLUCOSE-CAPILLARY: 224 mg/dL — AB (ref 65–99)
Glucose-Capillary: 142 mg/dL — ABNORMAL HIGH (ref 65–99)

## 2017-01-06 LAB — TYPE AND SCREEN
ABO/RH(D): A POS
Antibody Screen: NEGATIVE
Unit division: 0

## 2017-01-06 LAB — CBC
HCT: 27.7 % — ABNORMAL LOW (ref 36.0–46.0)
HEMOGLOBIN: 8.5 g/dL — AB (ref 12.0–15.0)
MCH: 29.4 pg (ref 26.0–34.0)
MCHC: 30.7 g/dL (ref 30.0–36.0)
MCV: 95.8 fL (ref 78.0–100.0)
PLATELETS: 196 10*3/uL (ref 150–400)
RBC: 2.89 MIL/uL — AB (ref 3.87–5.11)
RDW: 17.5 % — ABNORMAL HIGH (ref 11.5–15.5)
WBC: 8.4 10*3/uL (ref 4.0–10.5)

## 2017-01-06 LAB — BPAM RBC
Blood Product Expiration Date: 201806272359
ISSUE DATE / TIME: 201806161616
Unit Type and Rh: 6200

## 2017-01-06 MED ORDER — FUROSEMIDE 10 MG/ML IJ SOLN
120.0000 mg | Freq: Three times a day (TID) | INTRAMUSCULAR | Status: DC
  Administered 2017-01-06 – 2017-01-08 (×5): 120 mg via INTRAVENOUS
  Filled 2017-01-06 (×5): qty 12

## 2017-01-06 MED ORDER — FUROSEMIDE 10 MG/ML IJ SOLN
60.0000 mg | Freq: Two times a day (BID) | INTRAMUSCULAR | Status: DC
  Administered 2017-01-06: 20 mg via INTRAVENOUS
  Administered 2017-01-06: 60 mg via INTRAVENOUS
  Filled 2017-01-06: qty 6

## 2017-01-06 MED ORDER — LEVOTHYROXINE SODIUM 75 MCG PO TABS
75.0000 ug | ORAL_TABLET | Freq: Every day | ORAL | Status: DC
  Administered 2017-01-07 – 2017-01-12 (×6): 75 ug via ORAL
  Filled 2017-01-06 (×6): qty 1

## 2017-01-06 NOTE — Plan of Care (Signed)
Problem: Tissue Perfusion: Goal: Risk factors for ineffective tissue perfusion will decrease Outcome: Progressing Remains on room air, O2 sats in the 90's   Problem: Fluid Volume: Goal: Ability to maintain a balanced intake and output will improve Outcome: Progressing Continues to diurese   Problem: Nutrition: Goal: Adequate nutrition will be maintained Outcome: Not Progressing Patient has poor appetite, does not like the food we have in the hospital

## 2017-01-06 NOTE — Progress Notes (Signed)
Patient stable during 7 a to 7 p shift, patient continues with flat affect, delayed responses when asked direct questions at times. ? Depression after loss of husband in last year.  Up in room with walker, daughter at bedside majority of shift.

## 2017-01-06 NOTE — Progress Notes (Addendum)
PROGRESS NOTE                                                                                                                                                                                                             Patient Demographics:    Alyssa [name], is a 71 y.o. female, DOB - 1946/07/03, QPY:[id number]  Admit date - 01/04/2017   Admitting Physician Benito Mccreedy, MD  Outpatient Primary MD for the patient is Patient, No Pcp Per  LOS - 1  Outpatient Specialists: none  Chief Complaint  Patient presents with  . Abdominal Pain       Brief Narrative      Subjective:   Patient reports her shortness of breath to have unchanged. Still feels weak. Denies chest pain or palpitation. Discussed with daughter at bedside.   Assessment  & Plan :   Principal problem Acute on chronic diastolic CHF. 2-D echo with EF of 50-55%. Has not been seen by cardiology since 2015. Increased Lasix dose to 60 mg IV twice a day. Strict I/O and daily weight. Continue Coreg. Holding ARB due to worsening renal function. Continue aspirin . Added BiDil. Cardiology consult appreciated.  Acute on chronic kidney disease stage III Possibly associated with diabetic nephropathy and medication nonadherence. Patient was referred to Dr. Joelyn Oms by her PCP in the past. Creatinine worsened from baseline of around 1.6, 6 months back. Patient may be progressing towards dialysis. -Monitor with diuresis. Nephrology consulted.  Moderate right pleural effusion Secondary to CHF. Possibly will improve with diuresis. Hold off on thoracentesis.  Elevated troponin Possibly associated with acute CHF. No chest pain symptoms or EKG changes.  Uncontrolled type 2 diabetes mellitus with hyperglycemia A1c of 7.4. Patient not following with current PCP due to disagreement regarding her plan of care. Continue Lantus with sliding scale  coverage.  Transaminitis Ultrasound abdomen shows gallbladder sludge and CBD dilated measuring up to 9 mm. No Murphy's sign. LFTs slowly improving. Will hold off on MRCP. Discontinue statin.  Hypothyroidism Elevated TSH. Increased Synthroid dose.  Anemia of chronic kidney disease. Colonoscopy in 2015 showed sessile polyps. FOBT negative. CT abdomen and pelvis unremarkable. Iron panel and B12 normal.  Hypomagnesemia Replenished.     Code Status : Full code  Family Communication  : Daughter at bedside  Disposition: Pending hospital course.  Barriers For Discharge :  Active symptoms  Consults  :   Cardiology Nephrology  Procedures  : 2-D echo CT chest Renal ultrasound  DVT Prophylaxis  :  Heparin  Lab Results  Component Value Date   PLT 196 01/06/2017    Antibiotics  :    Anti-infectives    None        Objective:   Vitals:   01/05/17 1850 01/05/17 1953 01/06/17 0429 01/06/17 1053  BP: (!) 105/59 (!) 115/51 120/62 (!) 152/63  Pulse: 66 71 76 81  Resp: 20 18 18    Temp: 97.5 F (36.4 C) 97.7 F (36.5 C) 98 F (36.7 C)   TempSrc: Oral Oral Oral   SpO2:  99% 98% 91%  Weight:   100.3 kg (221 lb 3.2 oz)   Height:        Wt Readings from Last 3 Encounters:  01/06/17 100.3 kg (221 lb 3.2 oz)  09/19/16 90.1 kg (198 lb 9.6 oz)  09/06/16 94.3 kg (208 lb)     Intake/Output Summary (Last 24 hours) at 01/06/17 1532 Last data filed at 01/06/17 1355  Gross per 24 hour  Intake              335 ml  Output              300 ml  Net               35 ml     Physical Exam  Gen: not in distress HEENT:Pallor present, moist mucosa, supple neck Chest: Diminished right-sided breath sounds, rhonchi or wheeze CVS: N S1&S2, no murmurs, rubs or gallop GI: soft, NT, ND, BS+ Musculoskeletal: warm, trace edema CNS: Somnolent but arousable, oriented.    Data Review:    CBC  Recent Labs Lab 01/04/17 1744 01/05/17 0357 01/06/17 0944  WBC 7.1 7.7 8.4  HGB  8.3* 7.7* 8.5*  HCT 26.2* 24.8* 27.7*  PLT 233 204 196  MCV 92.3 92.9 95.8  MCH 29.2 28.8 29.4  MCHC 31.7 31.0 30.7  RDW 16.5* 16.6* 17.5*  LYMPHSABS  --  1.2  --   MONOABS  --  0.3  --   EOSABS  --  0.1  --   BASOSABS  --  0.0  --     Chemistries   Recent Labs Lab 01/04/17 1322 01/05/17 0357 01/06/17 0944  NA 143 143 145  K 4.5 4.6 4.4  CL 112* 112* 114*  CO2 21* 23 21*  GLUCOSE 237* 265* 153*  BUN 71* 68* 67*  CREATININE 3.54* 3.65* 3.84*  CALCIUM 10.1 9.7 9.7  MG  --  1.6*  --   AST 81* 53* 39  ALT 192* 152* 115*  ALKPHOS 88 81 73  BILITOT 0.6 0.5 0.6   ------------------------------------------------------------------------------------------------------------------  Recent Labs  01/05/17 0357  CHOL 156  HDL 58  LDLCALC 86  TRIG 58  CHOLHDL 2.7    Lab Results  Component Value Date   HGBA1C 7.4 (H) 01/04/2017   ------------------------------------------------------------------------------------------------------------------  Recent Labs  01/05/17 0357  TSH 6.020*   ------------------------------------------------------------------------------------------------------------------  Recent Labs  01/04/17 2144  VITAMINB12 1,735*  FOLATE 47.6  FERRITIN 187  TIBC 273  IRON 40  RETICCTPCT 1.3    Coagulation profile No results for input(s): INR, PROTIME in the last 168 hours.  No results for input(s): DDIMER in the last 72 hours.  Cardiac Enzymes  Recent Labs Lab 01/04/17 2144 01/05/17 0357 01/05/17 1116  TROPONINI 0.07* 0.06* 0.06*   ------------------------------------------------------------------------------------------------------------------  Component Value Date/Time   BNP 1,317.6 (H) 01/05/2017 0357   BNP 36.6 10/29/2013 1828    Inpatient Medications  Scheduled Meds: . aspirin  81 mg Oral Daily  . atorvastatin  80 mg Oral q1800  . carvedilol  3.125 mg Oral BID WC  . cholecalciferol  5,000 Units Oral QPM  . furosemide   60 mg Intravenous BID  . insulin aspart  0-15 Units Subcutaneous TID WC  . insulin aspart  0-5 Units Subcutaneous QHS  . insulin glargine  8 Units Subcutaneous Daily  . isosorbide-hydrALAZINE  1 tablet Oral TID  . latanoprost  1 drop Both Eyes QPM  . [START ON 01/07/2017] levothyroxine  75 mcg Oral QAC breakfast  . minoxidil  5 mg Oral Daily  . multivitamin with minerals  1 tablet Oral Daily  . omega-3 acid ethyl esters  1 g Oral Daily  . pantoprazole  40 mg Oral Daily  . sertraline  100 mg Oral Daily  . sodium chloride flush  3 mL Intravenous Q12H  . timolol  1 drop Both Eyes BID  . vitamin B-12  1,000 mcg Oral QODAY   Continuous Infusions: . sodium chloride     PRN Meds:.sodium chloride, acetaminophen, ondansetron (ZOFRAN) IV, sodium chloride flush  Micro Results No results found for this or any previous visit (from the past 240 hour(s)).  Radiology Reports Ct Abdomen Pelvis Wo Contrast  Result Date: 01/04/2017 CLINICAL DATA:  Dizziness.  Abdominal pain with nausea and vomiting. EXAM: CT CHEST, ABDOMEN AND PELVIS WITHOUT CONTRAST TECHNIQUE: Multidetector CT imaging of the chest, abdomen and pelvis was performed following the standard protocol without IV contrast. COMPARISON:  None. FINDINGS: CT CHEST FINDINGS Cardiovascular: Cardiomegaly with small volume pericardial fluid. Enlarged main pulmonary artery at 4 cm, usually seen with pulmonary hypertension. Diffuse atherosclerotic calcification of the coronaries. Multifocal aortic and great vessel calcification. No acute vascular finding. Mediastinum/Nodes: Hilar enlargement it appears vascular. No adenopathy noted. Negative esophagus. Lungs/Pleura: Moderate layering pleural effusions, greater on the right. Multi segment atelectasis. The aerated lung shows no edema. No pneumothorax. Musculoskeletal: See below CT ABDOMEN PELVIS FINDINGS Hepatobiliary: No focal liver abnormality.Subtle layering calculi. No inflammatory changes in the biliary  tree. Normal common bile duct diameter. Pancreas: Unremarkable. Spleen: Unremarkable. Adrenals/Urinary Tract: Negative adrenals. No hydronephrosis or stone. Hilar calcifications appear atherosclerotic. Unremarkable bladder. Stomach/Bowel: No obstruction. No appendicitis. Extensive sigmoid diverticulosis. Vascular/Lymphatic: Diffuse atherosclerotic calcification. No noted adenopathy. No mass or adenopathy. Reproductive:Fibroid uterus with 4 cm calcified/ hyalinized fibroid. Other: No ascites or pneumoperitoneum.  Anasarca. Musculoskeletal: No acute finding. Severe lumbar facet arthropathy. Status post L4-5 fusion. L5-S1 ankylosis or fusion. Extensive thoracic spondylosis with multi-level ankylosis. Advanced glenohumeral osteoarthritis. Due to patient size, some of the abdominal wall is not visible. IMPRESSION: 1. Cardiomegaly with volume overload. Moderate right and small left pleural effusions with multi segment atelectasis. Extensive anasarca. 2. Probable pulmonary hypertension. 3. Extensive colonic diverticulosis. 4. Cholelithiasis. Electronically Signed   By: Monte Fantasia M.D.   On: 01/04/2017 20:04   Ct Head Wo Contrast  Result Date: 01/04/2017 CLINICAL DATA:  Dizziness and weakness. EXAM: CT HEAD WITHOUT CONTRAST TECHNIQUE: Contiguous axial images were obtained from the base of the skull through the vertex without intravenous contrast. COMPARISON:  10/12/2013 head CT, MRI 01/13/2014 FINDINGS: Brain: Chronic moderate superficial atrophy with small vessel ischemic disease of periventricular and subcortical white matter. No acute intracranial hemorrhage, midline shift or edema. No extra-axial fluid collections. The basal cisterns and fourth ventricle are midline. Vascular: Atherosclerotic  calcifications of the vertebral arteries and cavernous sinus carotids. No hyperdense vessels. Skull: Negative for fracture or focal lesions. Sinuses/Orbits: No acute finding. Other: None. IMPRESSION: Chronic moderate  superficial atrophy with small vessel ischemic disease. No acute intracranial abnormality. Electronically Signed   By: Ashley Royalty M.D.   On: 01/04/2017 19:47   Ct Chest Wo Contrast  Result Date: 01/04/2017 CLINICAL DATA:  Dizziness.  Abdominal pain with nausea and vomiting. EXAM: CT CHEST, ABDOMEN AND PELVIS WITHOUT CONTRAST TECHNIQUE: Multidetector CT imaging of the chest, abdomen and pelvis was performed following the standard protocol without IV contrast. COMPARISON:  None. FINDINGS: CT CHEST FINDINGS Cardiovascular: Cardiomegaly with small volume pericardial fluid. Enlarged main pulmonary artery at 4 cm, usually seen with pulmonary hypertension. Diffuse atherosclerotic calcification of the coronaries. Multifocal aortic and great vessel calcification. No acute vascular finding. Mediastinum/Nodes: Hilar enlargement it appears vascular. No adenopathy noted. Negative esophagus. Lungs/Pleura: Moderate layering pleural effusions, greater on the right. Multi segment atelectasis. The aerated lung shows no edema. No pneumothorax. Musculoskeletal: See below CT ABDOMEN PELVIS FINDINGS Hepatobiliary: No focal liver abnormality.Subtle layering calculi. No inflammatory changes in the biliary tree. Normal common bile duct diameter. Pancreas: Unremarkable. Spleen: Unremarkable. Adrenals/Urinary Tract: Negative adrenals. No hydronephrosis or stone. Hilar calcifications appear atherosclerotic. Unremarkable bladder. Stomach/Bowel: No obstruction. No appendicitis. Extensive sigmoid diverticulosis. Vascular/Lymphatic: Diffuse atherosclerotic calcification. No noted adenopathy. No mass or adenopathy. Reproductive:Fibroid uterus with 4 cm calcified/ hyalinized fibroid. Other: No ascites or pneumoperitoneum.  Anasarca. Musculoskeletal: No acute finding. Severe lumbar facet arthropathy. Status post L4-5 fusion. L5-S1 ankylosis or fusion. Extensive thoracic spondylosis with multi-level ankylosis. Advanced glenohumeral  osteoarthritis. Due to patient size, some of the abdominal wall is not visible. IMPRESSION: 1. Cardiomegaly with volume overload. Moderate right and small left pleural effusions with multi segment atelectasis. Extensive anasarca. 2. Probable pulmonary hypertension. 3. Extensive colonic diverticulosis. 4. Cholelithiasis. Electronically Signed   By: Monte Fantasia M.D.   On: 01/04/2017 20:04   US Abdomen Limited Ruq  Result Date: 01/05/2017 CLINICAL DATA:  Abnormal liver functions EXAM: ULTRASOUND ABDOMEN LIMITED RIGHT UPPER QUADRANT COMPARISON:  CT scan October 05, 2014 FINDINGS: Gallbladder: Sludge is seen in the gallbladder with no shadowing to definitively suggest stones. No Murphy's sign or pericholecystic fluid. The gallbladder wall measures up to 3.5 mm. Common bile duct: Diameter: 9 mm Liver: An echogenic mass in the liver measures up to 16 mm. No definitive correlate on the October 05, 2014 CT scan. Incidentally, a right pleural effusion is identified. IMPRESSION: 1. Significant sludge in the gallbladder. No definitive stones are visualized today but suspected small stones were seen on a CT scan in 2016. The gallbladder wall is mildly thickened. No Murphy's sign or pericholecystic fluid identified. 2. The common bile duct is dilated measuring up to 9 mm. Consider an MRCP to evaluate for choledocholithiasis. Electronically Signed   By: Dorise Bullion III M.D   On: 01/05/2017 18:58    Time Spent in minutes  25   Louellen Molder M.D on 01/06/2017 at 3:32 PM  Between 7am to 7pm - Pager - [phone]  After 7pm go to [email or web address] - password [facility]  [facility] -  Office  [phone]

## 2017-01-06 NOTE — Consult Note (Signed)
Cardiology Consultation:   Patient ID: Alyssa [name]; [id number]; 1946/06/09   Admit date: 01/04/2017 Date of Consult: 01/06/2017  Primary Care Provider: Patient, No Pcp Per Primary Cardiologist: Dr Claiborne Billings, in-hospital 2015 Primary Electrophysiologist:  n/a   Patient Profile:   Alyssa [name] is a 71 y.o. female with a hx of morbid obesity, D-CHF, NIDDM, HTN, pleural effusion 2015 w/ 1100 cc tapped, and dyslipidemia who is being seen today for the evaluation of CHF at the request of Dr Allyson Sabal.  History of Present Illness:   Alyssa [name] was hospitalized in 2015 w/ CHF and had one f/u office visit. Has not been seen by cardiology since then.   Alyssa [name] had diarrhea that was controlled by Imodium and resolved. She developed LE edema 1-2 weeks ago. She was feeling "poorly" and had DOE, fatigue. She denies orthopnea or PND but has a snoring history and there has been concern in the past for OSA. She has had some confusion.   She denies chest pain, palpitations, wheezing, cough, N&V. No other complaints.   Past Medical History:  Diagnosis Date  . Anemia   . Bipolar 1 disorder ([place])   . Cataract   . Chronic back pain   . Diabetes mellitus without complication ([place])   . Glaucoma   . Hyperlipidemia   . Hypertension   . Hypothyroidism   . Osteoporosis     Past Surgical History:  Procedure Laterality Date  . BACK SURGERY  2007  . BREAST SURGERY Left    CYST REMOVAL  . DILATION AND CURETTAGE OF UTERUS    . EYE SURGERY Left July 2015   Lt CE/ IOL implant  . EYE SURGERY Right July 2016   Rt CE/IOL  . KNEE ARTHROSCOPY Bilateral   . LUMBAR FUSION  2009     Inpatient Medications: Scheduled Meds: . aspirin  81 mg Oral Daily  . atorvastatin  80 mg Oral q1800  . carvedilol  3.125 mg Oral BID WC  . cholecalciferol  5,000 Units Oral QPM  . insulin aspart  0-15 Units Subcutaneous TID WC  . insulin aspart  0-5 Units Subcutaneous QHS  . insulin glargine  8 Units Subcutaneous Daily    . isosorbide-hydrALAZINE  1 tablet Oral TID  . latanoprost  1 drop Both Eyes QPM  . levothyroxine  50 mcg Oral QAC breakfast  . minoxidil  5 mg Oral Daily  . multivitamin with minerals  1 tablet Oral Daily  . omega-3 acid ethyl esters  1 g Oral Daily  . pantoprazole  40 mg Oral Daily  . sertraline  100 mg Oral Daily  . sodium chloride flush  3 mL Intravenous Q12H  . timolol  1 drop Both Eyes BID  . vitamin B-12  1,000 mcg Oral QODAY   Continuous Infusions: . sodium chloride     PRN Meds: sodium chloride, acetaminophen, ondansetron (ZOFRAN) IV, sodium chloride flush  Allergies:    Allergies  Allergen Reactions  . Ciprofloxacin Hcl Diarrhea  . Cymbalta [Duloxetine Hcl] Other (See Comments)    Per pt: it did not work  . Xanax [Alprazolam] Other (See Comments)    Excessive sleepiness  . Codeine Other (See Comments)    Per pt: unknown  . Lortab [Hydrocodone-Acetaminophen] Other (See Comments)    Per pt: unknown  . Oxycodone Other (See Comments)    Per pt: unknown   Medication Sig  allopurinol (ZYLOPRIM) 300 MG tablet Take 1 tablet (300 mg total) by mouth daily.  aspirin 81 MG tablet Take 81 mg by mouth daily.  atorvastatin (LIPITOR) 80 MG tablet TAKE 1 TABLET(80 MG) BY MOUTH DAILY  Cholecalciferol (VITAMIN D-3 PO) Take 5,000 Units by mouth every evening.   Cyanocobalamin (VITAMIN B 12 PO) Take 1,000 mcg by mouth every other day.  furosemide (LASIX) 40 MG tablet TAKE 1 TABLET BY MOUTH TWICE DAILY  gabapentin (NEURONTIN) 800 MG tablet Take 800 mg by mouth 4 (four) times daily.  glipiZIDE (GLUCOTROL) 10 MG tablet Take 10 mg by mouth as needed (for blood sugar).   latanoprost (XALATAN) 0.005 % ophthalmic solution Place 1 drop into both eyes every evening.   levothyroxine (SYNTHROID, LEVOTHROID) 50 MCG tablet TAKE 1 TABLET BY MOUTH DAILY BEFORE BREAKFAST  losartan (COZAAR) 100 MG tablet TAKE 1 TABLET BY MOUTH DAILY  minoxidil (LONITEN) 10 MG tablet TAKE 1/2 TO 1 TABLET BY MOUTH  DAILY AS DIRECTED FOR BLOOD PRESSURE Patient taking differently: TAKE 1 TABLET BY MOUTH DAILY AS DIRECTED FOR BLOOD PRESSURE  Multiple Vitamin (MULTIVITAMIN) tablet Take 1 tablet by mouth daily.  Omega-3 Fatty Acids (FISH OIL) 1000 MG CAPS Take 1,000 mg by mouth every evening.   timolol (TIMOPTIC) 0.5 % ophthalmic solution INSTILL 1 DROP IN BOTH EYES TWICE DAILY  ACCU-CHEK AVIVA PLUS test strip USE TO TEST BLOOD SUGAR THREE TIMES DAILY  Blood Glucose Monitoring Suppl (ACCU-CHEK AVIVA PLUS) W/DEVICE KIT   Lancets (FREESTYLE) lancets Use as instructed     Social History:   Social History   Social History  . Marital status: Married    Spouse name: N/A  . Number of children: N/A  . Years of education: N/A   Occupational History  .  Retired   Social History Main Topics  . Smoking status: Never Smoker  . Smokeless tobacco: Never Used  . Alcohol use Yes     Comment: social drinker- glass of wine or cocktail  . Drug use: No  . Sexual activity: No   Other Topics Concern  . Not on file   Social History Narrative  . No narrative on file    Family History:   The patient's family history is not on file. She was adopted.  ROS:  Please see the history of present illness.  Review of Systems  Constitution: Negative.  HENT: Negative.   Eyes: Negative.   Cardiovascular: Positive for dyspnea on exertion.  Respiratory: Positive for shortness of breath and snoring.   Endocrine: Negative.   Hematologic/Lymphatic: Negative.   Skin: Negative.   Gastrointestinal: Positive for diarrhea.  Genitourinary: Negative.   Neurological: Negative.   Psychiatric/Behavioral: Negative.   Allergic/Immunologic: Negative.     All other ROS reviewed and negative.     Physical Exam/Data:   Vitals:   01/05/17 1732 01/05/17 1850 01/05/17 1953 01/06/17 0429  BP:  (!) 105/59 (!) 115/51 120/62  Pulse: 65 66 71 76  Resp:  '20 18 18  '$ Temp:  97.5 F (36.4 C) 97.7 F (36.5 C) 98 F (36.7 C)  TempSrc:   Oral Oral Oral  SpO2:   99% 98%  Weight:    221 lb 3.2 oz (100.3 kg)  Height:        Intake/Output Summary (Last 24 hours) at 01/06/17 0721 Last data filed at 01/05/17 1850  Gross per 24 hour  Intake              335 ml  Output             1000 ml  Net             -665 ml   Filed Weights   01/04/17 2323 01/06/17 0429  Weight: 222 lb 3.2 oz (100.8 kg) 221 lb 3.2 oz (100.3 kg)   Body mass index is 39.18 kg/m.  General:  Well nourished, well developed, in no acute distress HEENT: normal Lymph: no adenopathy Neck: no JVD Endocrine:  No thryomegaly Vascular: No carotid bruits; FA pulses 2+ bilaterally without bruits  Cardiac:  normal S1, S2; RRR; soft murmur  Lungs:  Decreased BS bases with a few rales, no wheezing, rhonchi Abd: soft,  tender, no hepatomegaly  Ext: no edema Musculoskeletal:  No deformities, BUE and BLE strength normal and equal Skin: warm and dry  Neuro:  CNs 2-12 intact, no focal abnormalities noted Psych:  Normal affect    EKG:  The EKG was personally reviewed and demonstrates SR, PVC, large S waves lateral leads  Relevant CV Studies: ECHO: 01/05/2017 - Left ventricle: The cavity size was normal. Wall thickness was   increased in a pattern of severe LVH. Systolic function was   normal. The estimated ejection fraction was in the range of 50%   to 55%. Wall motion was normal; there were no regional wall   motion abnormalities. Doppler parameters are consistent with   abnormal left ventricular relaxation (grade 1 diastolic   dysfunction). Doppler parameters are consistent with high   ventricular filling pressure. - Aortic valve: Mildly calcified annulus. Trileaflet; normal   thickness leaflets. Valve area (VTI): 3.61 cm^2. Valve area   (Vmax): 3.31 cm^2. Valve area (Vmean): 3.39 cm^2. - Mitral valve: Mildly calcified annulus. Normal thickness leaflets   . Valve area by continuity equation (using LVOT flow): 2.66 cm^2. - Left atrium: The atrium was mildly  to moderately dilated. - Right atrium: The atrium was mildly dilated. - Atrial septum: No defect or patent foramen ovale was identified. - Pericardium, extracardiac: There is a large left pleural   effusion. There is a trivial circumferential pericardial   effusion. There is no evidence of tamponade physiology.  Laboratory Data:  Chemistry  Recent Labs Lab 01/04/17 1322 01/05/17 0357  NA 143 143  K 4.5 4.6  CL 112* 112*  CO2 21* 23  GLUCOSE 237* 265*  BUN 71* 68*  CREATININE 3.54* 3.65*  CALCIUM 10.1 9.7  GFRNONAA 12* 11*  GFRAA 14* 13*  ANIONGAP 10 8     Recent Labs Lab 01/04/17 1322 01/05/17 0357  PROT 6.7 5.9*  ALBUMIN 3.8 3.4*  AST 81* 53*  ALT 192* 152*  ALKPHOS 88 81  BILITOT 0.6 0.5   Hematology  Recent Labs Lab 01/04/17 1744 01/04/17 2144 01/05/17 0357  WBC 7.1  --  7.7  RBC 2.84* 2.74* 2.67*  HGB 8.3*  --  7.7*  HCT 26.2*  --  24.8*  MCV 92.3  --  92.9  MCH 29.2  --  28.8  MCHC 31.7  --  31.0  RDW 16.5*  --  16.6*  PLT 233  --  204   Cardiac Enzymes  Recent Labs Lab 01/04/17 1744 01/04/17 2144 01/05/17 0357 01/05/17 1116  TROPONINI 0.06* 0.07* 0.06* 0.06*     BNP  Recent Labs Lab 01/04/17 1744 01/05/17 0357  BNP 899.1* 1,317.6*     Radiology/Studies:  Ct Abdomen Pelvis Wo Contrast Result Date: 01/04/2017 CLINICAL DATA:  Dizziness.  Abdominal pain with nausea and vomiting. EXAM: CT CHEST, ABDOMEN AND PELVIS WITHOUT CONTRAST TECHNIQUE: Multidetector CT imaging of the  chest, abdomen and pelvis was performed following the standard protocol without IV contrast. COMPARISON:  None. FINDINGS: CT CHEST FINDINGS Cardiovascular: Cardiomegaly with small volume pericardial fluid. Enlarged main pulmonary artery at 4 cm, usually seen with pulmonary hypertension. Diffuse atherosclerotic calcification of the coronaries. Multifocal aortic and great vessel calcification. No acute vascular finding. Mediastinum/Nodes: Hilar enlargement it appears  vascular. No adenopathy noted. Negative esophagus. Lungs/Pleura: Moderate layering pleural effusions, greater on the right. Multi segment atelectasis. The aerated lung shows no edema. No pneumothorax. Musculoskeletal: See below CT ABDOMEN PELVIS FINDINGS Hepatobiliary: No focal liver abnormality.Subtle layering calculi. No inflammatory changes in the biliary tree. Normal common bile duct diameter. Pancreas: Unremarkable. Spleen: Unremarkable. Adrenals/Urinary Tract: Negative adrenals. No hydronephrosis or stone. Hilar calcifications appear atherosclerotic. Unremarkable bladder. Stomach/Bowel: No obstruction. No appendicitis. Extensive sigmoid diverticulosis. Vascular/Lymphatic: Diffuse atherosclerotic calcification. No noted adenopathy. No mass or adenopathy. Reproductive:Fibroid uterus with 4 cm calcified/ hyalinized fibroid. Other: No ascites or pneumoperitoneum.  Anasarca. Musculoskeletal: No acute finding. Severe lumbar facet arthropathy. Status post L4-5 fusion. L5-S1 ankylosis or fusion. Extensive thoracic spondylosis with multi-level ankylosis. Advanced glenohumeral osteoarthritis. Due to patient size, some of the abdominal wall is not visible. IMPRESSION: 1. Cardiomegaly with volume overload. Moderate right and small left pleural effusions with multi segment atelectasis. Extensive anasarca. 2. Probable pulmonary hypertension. 3. Extensive colonic diverticulosis. 4. Cholelithiasis. Electronically Signed   By: Monte Fantasia M.D.   On: 01/04/2017 20:04   Ct Head Wo Contrast Result Date: 01/04/2017 CLINICAL DATA:  Dizziness and weakness. EXAM: CT HEAD WITHOUT CONTRAST TECHNIQUE: Contiguous axial images were obtained from the base of the skull through the vertex without intravenous contrast. COMPARISON:  10/12/2013 head CT, MRI 01/13/2014 FINDINGS: Brain: Chronic moderate superficial atrophy with small vessel ischemic disease of periventricular and subcortical white matter. No acute intracranial hemorrhage,  midline shift or edema. No extra-axial fluid collections. The basal cisterns and fourth ventricle are midline. Vascular: Atherosclerotic calcifications of the vertebral arteries and cavernous sinus carotids. No hyperdense vessels. Skull: Negative for fracture or focal lesions. Sinuses/Orbits: No acute finding. Other: None. IMPRESSION: Chronic moderate superficial atrophy with small vessel ischemic disease. No acute intracranial abnormality. Electronically Signed   By: Ashley Royalty M.D.   On: 01/04/2017 19:47   Ct Chest Wo Contrast Result Date: 01/04/2017 CLINICAL DATA:  Dizziness.  Abdominal pain with nausea and vomiting. EXAM: CT CHEST, ABDOMEN AND PELVIS WITHOUT CONTRAST TECHNIQUE: Multidetector CT imaging of the chest, abdomen and pelvis was performed following the standard protocol without IV contrast. COMPARISON:  None. FINDINGS: CT CHEST FINDINGS Cardiovascular: Cardiomegaly with small volume pericardial fluid. Enlarged main pulmonary artery at 4 cm, usually seen with pulmonary hypertension. Diffuse atherosclerotic calcification of the coronaries. Multifocal aortic and great vessel calcification. No acute vascular finding. Mediastinum/Nodes: Hilar enlargement it appears vascular. No adenopathy noted. Negative esophagus. Lungs/Pleura: Moderate layering pleural effusions, greater on the right. Multi segment atelectasis. The aerated lung shows no edema. No pneumothorax. Musculoskeletal: See below CT ABDOMEN PELVIS FINDINGS Hepatobiliary: No focal liver abnormality.Subtle layering calculi. No inflammatory changes in the biliary tree. Normal common bile duct diameter. Pancreas: Unremarkable. Spleen: Unremarkable. Adrenals/Urinary Tract: Negative adrenals. No hydronephrosis or stone. Hilar calcifications appear atherosclerotic. Unremarkable bladder. Stomach/Bowel: No obstruction. No appendicitis. Extensive sigmoid diverticulosis. Vascular/Lymphatic: Diffuse atherosclerotic calcification. No noted adenopathy. No  mass or adenopathy. Reproductive:Fibroid uterus with 4 cm calcified/ hyalinized fibroid. Other: No ascites or pneumoperitoneum.  Anasarca. Musculoskeletal: No acute finding. Severe lumbar facet arthropathy. Status post L4-5 fusion. L5-S1 ankylosis or fusion. Extensive thoracic  spondylosis with multi-level ankylosis. Advanced glenohumeral osteoarthritis. Due to patient size, some of the abdominal wall is not visible. IMPRESSION: 1. Cardiomegaly with volume overload. Moderate right and small left pleural effusions with multi segment atelectasis. Extensive anasarca. 2. Probable pulmonary hypertension. 3. Extensive colonic diverticulosis. 4. Cholelithiasis. Electronically Signed   By: Monte Fantasia M.D.   On: 01/04/2017 20:04   US Abdomen Limited Ruq Result Date: 01/05/2017 CLINICAL DATA:  Abnormal liver functions EXAM: ULTRASOUND ABDOMEN LIMITED RIGHT UPPER QUADRANT COMPARISON:  CT scan October 05, 2014 FINDINGS: Gallbladder: Sludge is seen in the gallbladder with no shadowing to definitively suggest stones. No Murphy's sign or pericholecystic fluid. The gallbladder wall measures up to 3.5 mm. Common bile duct: Diameter: 9 mm Liver: An echogenic mass in the liver measures up to 16 mm. No definitive correlate on the October 05, 2014 CT scan. Incidentally, a right pleural effusion is identified. IMPRESSION: 1. Significant sludge in the gallbladder. No definitive stones are visualized today but suspected small stones were seen on a CT scan in 2016. The gallbladder wall is mildly thickened. No Murphy's sign or pericholecystic fluid identified. 2. The common bile duct is dilated measuring up to 9 mm. Consider an MRCP to evaluate for choledocholithiasis. Electronically Signed   By: Dorise Bullion III M.D   On: 01/05/2017 18:58    Assessment and Plan:   1. Acute on chronic diastolic CHF: diet and med compliance pta are not clear.  - she got 1 dose of IV Lasix 40 mg after admission with improvement in her sx. -  however, her BNP is high and she has a large pleural effusion on CXR - MD advise on CXR +/- decubitus film to assess effusions/layering, and see if she needs a tap - with worsening in her renal function, Nephrology consult needed - with BUN/Cr this high, would prefer they manage diuresis.  - she is not on ACE/ARB now but was on Cozaar 100 mg qd pta  2. Elevated troponin: minimal elevation, flat trend, are more consistent with CHF than CAD. Also, she has no hx chest pain, no WMA on echo and EF is normal.  - no ischemic eval indicated.  3. Acute on chronic renal failure: recommend Nephrology consult - CMET ordered, will make sure it is drawn.  Otherwise, per IM Active Problems:   T2_NIDDM w/CKD 3    Acute diastolic congestive heart failure (HCC)   CKD (chronic kidney disease) stage 3, GFR 30-59 ml/min   Anemia   Acute CHF (congestive heart failure) (HCC)   Abnormal liver function   Acute kidney injury [facility])    Signed, Lenoard Aden  01/06/2017 7:21 AM  Cardiology attending  Patient seen and examined. I have reviewed the findings as documented above and concur with minimal modification. The patient is a 71 year old woman who I never seen as a patient but no from taking care of her husband, now deceased for many years. She has a history of diastolic heart failure and morbid obesity and chronic renal insufficiency. She is also been bothered over the years by severe back pain. She was admitted to the hospital 3 years ago and underwent diagnostic and therapeutic thoracentesis to. She presents back to the hospital with progressive swelling and shortness of breath. Her creatinine has increased. Her weight is down a pound. She feels like her edema is improved if nothing else with bedrest. On physical exam she is a pleasant 71 year old woman in no distress. Cardiovascular exam reveals a regular rate and  rhythm with normal S1 and S2. I did not appreciate any murmurs. The lungs revealed rales  in the bases right greater than left with no wheezes or rhonchi present. The abdominal exam was soft and nontender and obese the extremities demonstrate 1+ peripheral edema which is soft bilaterally. Neurologic exam is unremarkable except that her affect is somewhat blunted. ECG demonstrates sinus rhythm with borderline low voltage. Nonspecific ST-T wave changes. Labs were reviewed. The troponin is borderline elevated and of no clinical consequence. Assessment and plan 1. Acute on chronic diastolic heart failure - the patient is volume overloaded, and will require diuresis. 2. 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 Would consider nephrology consultation 3. Morbid obesity - the patient's weight is always been a problem for her. She is encouraged to lose weight. 4. Hypertension - her blood pressure today is well controlled. She will be encouraged to maintain a low-sodium diet. 5. Pleural effusion - review the CT scan demonstrates a moderate right-sided pleural effusion. This is been drained in the past. I do not think likely to be a benefit to drain again today as it is not icteric reaccumulated sufficiently but will recheck the chest x-ray to see if it is improved since admission.  Cristopher Peru, M.D.

## 2017-01-06 NOTE — Consult Note (Addendum)
Renal Service Consult Note [place] 01/06/2017 [place] Requesting Physician:  Dr Clementeen Graham  Reason for Consult:  CKD pt w worsening renal failure and CHF HPI: The patient is a 71 y.o. year-old with hx of HTN, DM2, HL, glaucoma, chronic back pain and bipolar 1 disorder who presented on 6/15 with abd pain, nausea and diarrhea, abd bloating, gen'd weakness and "lethargic".  Also noted LE edema, mild SOB but no fever or cough.  CT chest / abd done in ED showed R> L pleural effusions, fluid overload/ anasarca and prob pulm HTN.  Hb was down at 8.3.  Pt admitted with dx of acute CHF and started on IV lasix. Also had acute on chronic CKD with creat 3.5. We are asked to see for a/c renal failure.    Pt feels better than on admission.  Wt's are unchanged, I/O's are 515 in and 1800 out over 2.5 days so far.  Denies orthopnea, but very vague historian and may be playing down her symptoms. No CP.  She has seen one of the renal doctors from [place] 1-2 times sometime recently but can't remember their name or exact dates.         Chart Mar 2015 - acute resp failure/ hypoxemia, had thoracentesis for R effusion, underwent diuresis, diast CHF by ECHO, pulm pressures 74m Hg. Nuclear stress test negative.  AMS/ TME, resolved. HTN, DM, hx bipolar.  May 2009 - lumbar back surgery for back pain/ disc hernation     Date   Creat    eGFR 2009   0.9- 1.0 2015   1.3- 1.9  34- 41 2016   1.3- 1.85 2017    1.3- 2.0 Jan '18   2.87   Feb '18  2.76 Jun 15  '18  3.54   12 Jun 17  '18  3.84   11   ROS  denies CP  no joint pain   no HA  no blurry vision  no rash  no diarrhea  no nausea/ vomiting  no dysuria  no difficulty voiding  no change in urine color    Past Medical History  Past Medical History:  Diagnosis Date  . Anemia   . Bipolar 1 disorder ([place]   . Cataract   . Chronic back pain   . Diabetes mellitus without complication ([facility]   . Glaucoma   .  Hyperlipidemia   . Hypertension   . Hypothyroidism   . Osteoporosis    Past Surgical History  Past Surgical History:  Procedure Laterality Date  . BACK SURGERY  2007  . BREAST SURGERY Left    CYST REMOVAL  . DILATION AND CURETTAGE OF UTERUS    . EYE SURGERY Left July 2015   Lt CE/ IOL implant  . EYE SURGERY Right July 2016   Rt CE/IOL  . KNEE ARTHROSCOPY Bilateral   . LUMBAR FUSION  2009   Family History  Family History  Problem Relation Age of Onset  . Adopted: Yes   Social History  reports that she has never smoked. She has never used smokeless tobacco. She reports that she drinks alcohol. She reports that she does not use drugs. Allergies  Allergies  Allergen Reactions  . Ciprofloxacin Hcl Diarrhea  . Cymbalta [Duloxetine Hcl] Other (See Comments)    Per pt: it did not work  . Xanax [Alprazolam] Other (See Comments)    Excessive sleepiness  . Codeine Other (See Comments)    Per pt:  unknown  . Lortab [Hydrocodone-Acetaminophen] Other (See Comments)    Per pt: unknown  . Oxycodone Other (See Comments)    Per pt: unknown   Home medications Prior to Admission medications   Medication Sig Start Date End Date Taking? Authorizing Provider  allopurinol (ZYLOPRIM) 300 MG tablet Take 1 tablet (300 mg total) by mouth daily. 08/06/16 08/06/17 Yes Collier, Amanda, PA-C  aspirin 81 MG tablet Take 81 mg by mouth daily.   Yes [provider]  atorvastatin (LIPITOR) 80 MG tablet TAKE 1 TABLET(80 MG) BY MOUTH DAILY 06/05/16  Yes McKeown, William, MD  Cholecalciferol (VITAMIN D-3 PO) Take 5,000 Units by mouth every evening.    Yes [provider]  Cyanocobalamin (VITAMIN B 12 PO) Take 1,000 mcg by mouth every other day.   Yes [provider]  furosemide (LASIX) 40 MG tablet TAKE 1 TABLET BY MOUTH TWICE DAILY 10/18/16  Yes Collier, Amanda, PA-C  gabapentin (NEURONTIN) 800 MG tablet Take 800 mg by mouth 4 (four) times daily. 10/28/16  Yes [provider]   glipiZIDE (GLUCOTROL) 10 MG tablet Take 10 mg by mouth as needed (for blood sugar).  10/29/16  Yes [provider]  latanoprost (XALATAN) 0.005 % ophthalmic solution Place 1 drop into both eyes every evening.  03/31/14  Yes [provider]  levothyroxine (SYNTHROID, LEVOTHROID) 50 MCG tablet TAKE 1 TABLET BY MOUTH DAILY BEFORE BREAKFAST 09/04/16  Yes McKeown, William, MD  losartan (COZAAR) 100 MG tablet TAKE 1 TABLET BY MOUTH DAILY 06/27/16  Yes McKeown, William, MD  minoxidil (LONITEN) 10 MG tablet TAKE 1/2 TO 1 TABLET BY MOUTH DAILY AS DIRECTED FOR BLOOD PRESSURE Patient taking differently: TAKE 1 TABLET BY MOUTH DAILY AS DIRECTED FOR BLOOD PRESSURE 09/20/16  Yes Collier, Amanda, PA-C  Multiple Vitamin (MULTIVITAMIN) tablet Take 1 tablet by mouth daily.   Yes [provider]  Omega-3 Fatty Acids (FISH OIL) 1000 MG CAPS Take 1,000 mg by mouth every evening.    Yes [provider]  timolol (TIMOPTIC) 0.5 % ophthalmic solution INSTILL 1 DROP IN BOTH EYES TWICE DAILY 10/21/15  Yes McKeown, William, MD  ACCU-CHEK AVIVA PLUS test strip USE TO TEST BLOOD SUGAR THREE TIMES DAILY 06/28/16   Forcucci, Courtney, PA-C  Blood Glucose Monitoring Suppl (ACCU-CHEK AVIVA PLUS) W/DEVICE KIT  09/13/14   [provider]  Lancets (FREESTYLE) lancets Use as instructed 09/15/15   Forcucci, Courtney, PA-C   Liver Function Tests  Recent Labs Lab 01/04/17 1322 01/05/17 0357 01/06/17 0944  AST 81* 53* 39  ALT 192* 152* 115*  ALKPHOS 88 81 73  BILITOT 0.6 0.5 0.6  PROT 6.7 5.9* 6.0*  ALBUMIN 3.8 3.4* 3.3*    Recent Labs Lab 01/04/17 1322  LIPASE 46   CBC  Recent Labs Lab 01/04/17 1744 01/05/17 0357 01/06/17 0944  WBC 7.1 7.7 8.4  NEUTROABS  --  6.0  --   HGB 8.3* 7.7* 8.5*  HCT 26.2* 24.8* 27.7*  MCV 92.3 92.9 95.8  PLT 233 204 196   Basic Metabolic Panel  Recent Labs Lab 01/04/17 1322 01/05/17 0357 01/06/17 0944  NA 143 143 145  K 4.5 4.6 4.4  CL 112*  112* 114*  CO2 21* 23 21*  GLUCOSE 237* 265* 153*  BUN 71* 68* 67*  CREATININE 3.54* 3.65* 3.84*  CALCIUM 10.1 9.7 9.7   Iron/TIBC/Ferritin/ %Sat    Component Value Date/Time   IRON 40 01/04/2017 2144   TIBC 273 01/04/2017 2144   FERRITIN   187 01/04/2017 2144   IRONPCTSAT 15 01/04/2017 2144   IRONPCTSAT 19 01/05/2016 0950    Vitals:   01/05/17 1953 01/06/17 0429 01/06/17 1053 01/06/17 1724  BP: (!) 115/51 120/62 (!) 152/63 (!) 174/78  Pulse: 71 76 81 65  Resp: 18 18    Temp: 97.7 F (36.5 C) 98 F (36.7 C)    TempSrc: Oral Oral    SpO2: 99% 98% 91% 98%  Weight:  100.3 kg (221 lb 3.2 oz)    Height:       Exam Gen elderly WF sitting up eating dinner, no distress No rash, cyanosis or gangrene Sclera anicteric, throat clear  No jvd or bruits Chest bilat fine rales at the bases, no wheezing/ bronch BS RRR question syst M, no RG Abd soft ntnd no mass or ascites +bs obese GU deferred MS no joint effusions or deformity Ext trace LE edema / no wounds or ulcers Neuro is alert, Ox 3 , nf   Home meds > neurontin, glucotrol, allpurinol, asa, lipitor, lasix 40 bid, T4, Cozaar, minoxidil 5 - 10 mg daily prn for high BP, MVI, insulin  Hosp meds > asa, coreg 3.125 bid, lasix 60 IV bid, insulin, Bidil, T4, minoxidl 5/d, PPI, zoloft, others  UA > clear, 100 prot, 6-30 wbc, 0-5 rbc  Assess: 1  Acute / chronic renal failure - not sure cause, could very well be cardiorenal effect, she comes in with significant vol overload but is not real symptomatic today.  She has pyuria on PPI, consider AIN.  Will dc PPI.  She may be nearing ESRD.  No strong indication at this time but will need to watch closely. She is R handed.  Will plan to increase diuretics IV and see if this improves her Starling curve.  She has a renal doctor from [place], not sure name, get records on Monday.   2  Acute/ chron diast CHF / vol overload - as above 3  HTN - cozaar on hold, cont home minoxidil; coreg and Bidil added  here. BP's are good.  4  Gout 5  HL   Plan - as above  Kelly Splinter MD [employer] pager [phone]   01/06/2017, 5:32 PM

## 2017-01-07 ENCOUNTER — Inpatient Hospital Stay (HOSPITAL_COMMUNITY): Payer: Medicare Other

## 2017-01-07 DIAGNOSIS — I5021 Acute systolic (congestive) heart failure: Secondary | ICD-10-CM

## 2017-01-07 LAB — HEPATITIS PANEL, ACUTE
HCV Ab: <0.1 s/co ratio (ref 0.0–0.9)
HEP A IGM: NEGATIVE
HEP B C IGM: NEGATIVE
Hepatitis B Surface Ag: NEGATIVE

## 2017-01-07 LAB — HEPATIC FUNCTION PANEL
ALBUMIN: 3.3 g/dL — AB (ref 3.5–5.0)
ALK PHOS: 63 U/L (ref 38–126)
ALT: 89 U/L — ABNORMAL HIGH (ref 14–54)
AST: 25 U/L (ref 15–41)
BILIRUBIN TOTAL: 0.7 mg/dL (ref 0.3–1.2)
Bilirubin, Direct: 0.2 mg/dL (ref 0.1–0.5)
Indirect Bilirubin: 0.5 mg/dL (ref 0.3–0.9)
Total Protein: 5.5 g/dL — ABNORMAL LOW (ref 6.5–8.1)

## 2017-01-07 LAB — CBC
HCT: 25.7 % — ABNORMAL LOW (ref 36.0–46.0)
Hemoglobin: 7.9 g/dL — ABNORMAL LOW (ref 12.0–15.0)
MCH: 28.6 pg (ref 26.0–34.0)
MCHC: 30.7 g/dL (ref 30.0–36.0)
MCV: 93.1 fL (ref 78.0–100.0)
PLATELETS: 185 10*3/uL (ref 150–400)
RBC: 2.76 MIL/uL — ABNORMAL LOW (ref 3.87–5.11)
RDW: 17.3 % — AB (ref 11.5–15.5)
WBC: 7 10*3/uL (ref 4.0–10.5)

## 2017-01-07 LAB — BASIC METABOLIC PANEL
Anion gap: 11 (ref 5–15)
BUN: 67 mg/dL — ABNORMAL HIGH (ref 6–20)
CHLORIDE: 112 mmol/L — AB (ref 101–111)
CO2: 21 mmol/L — AB (ref 22–32)
CREATININE: 3.9 mg/dL — AB (ref 0.44–1.00)
Calcium: 9.6 mg/dL (ref 8.9–10.3)
GFR calc non Af Amer: 11 mL/min — ABNORMAL LOW (ref >60)
GFR, EST AFRICAN AMERICAN: 12 mL/min — AB (ref >60)
GLUCOSE: 136 mg/dL — AB (ref 65–99)
Potassium: 4.2 mmol/L (ref 3.5–5.1)
Sodium: 144 mmol/L (ref 135–145)

## 2017-01-07 LAB — GLUCOSE, CAPILLARY
GLUCOSE-CAPILLARY: 168 mg/dL — AB (ref 65–99)
Glucose-Capillary: 126 mg/dL — ABNORMAL HIGH (ref 65–99)
Glucose-Capillary: 229 mg/dL — ABNORMAL HIGH (ref 65–99)
Glucose-Capillary: 249 mg/dL — ABNORMAL HIGH (ref 65–99)

## 2017-01-07 MED ORDER — SODIUM CHLORIDE 0.9% FLUSH: 10.0000 mL | INTRAVENOUS | Status: DC | PRN

## 2017-01-07 MED ORDER — SODIUM CHLORIDE 0.9% FLUSH
10.0000 mL | Freq: Two times a day (BID) | INTRAVENOUS | Status: DC
  Administered 2017-01-08 – 2017-01-10 (×2): 10 mL

## 2017-01-07 NOTE — Progress Notes (Addendum)
[facility] Progress Note    Assessment/ Plan:   1  Acute / chronic renal failure - not sure cause, could very well be cardiorenal effect, she comes in with significant vol overload but is not real symptomatic today.  She has pyuria on PPI, consider AIN.  Will dc PPI.  She may be nearing ESRD.  No strong indication at this time but will need to watch closely. She is R handed.  Creatinine has plateaued; appears to be diuresing appropriately; weights not accurate.  Continue Lasix 120 iV TID.  Is followed by Dr. Joelyn Oms in clinic. 2  Acute/ chron diast CHF / vol overload - as above 3  HTN - cozaar on hold, cont home minoxidil; coreg and Bidil added here. BP's are good.  4  Gout 5  HL  Subjective:    Creatinine has essentially plateaued; doing better.  Has O2 on this AM.   Objective:   BP (!) 110/56 (BP Location: Left Arm)   Pulse 62   Temp 97.5 F (36.4 C) (Oral)   Resp 18   Ht 5\' 3"  (1.6 m)   Wt 108.8 kg (239 lb 13.8 oz) Comment: scale c  SpO2 97%   BMI 42.49 kg/m   Intake/Output Summary (Last 24 hours) at 01/07/17 1228 Last data filed at 01/07/17 1036  Gross per 24 hour  Intake              243 ml  Output             1500 ml  Net            -1257 ml   Weight change: 8.464 kg (18 lb 10.6 oz)  Physical Exam: Gen elderly WF sitting in chair, NAD Sclera anicteric No jvd or bruits Chest bilat fine rales 1/3 up lung fields bilaterally RRR question syst M, no RG Abd soft ntnd no mass or ascites +bs obese MS no joint effusions or deformity Ext trace LE edema / no wounds or ulcers Neuro is alert, Ox 3 , nf Imaging: Dg Chest 2 View  Result Date: 01/07/2017 CLINICAL DATA:  Chronic diastolic congestive heart failure. EXAM: CHEST  2 VIEW COMPARISON:  CT chest 01/04/2017 and chest radiograph 01/11/2015. FINDINGS: Trachea is midline. Heart is enlarged. Basilar dependent mixed interstitial and airspace opacification with consolidation in the right lower lobe. Small  bilateral pleural effusions. Degenerative changes in shoulders. IMPRESSION: 1. Congestive heart failure. 2. Consolidation in the right lower lobe. Pneumonia cannot be excluded. Electronically Signed   By: Lorin Picket M.D.   On: 01/07/2017 07:38   US Abdomen Limited Ruq  Result Date: 01/05/2017 CLINICAL DATA:  Abnormal liver functions EXAM: ULTRASOUND ABDOMEN LIMITED RIGHT UPPER QUADRANT COMPARISON:  CT scan October 05, 2014 FINDINGS: Gallbladder: Sludge is seen in the gallbladder with no shadowing to definitively suggest stones. No Murphy's sign or pericholecystic fluid. The gallbladder wall measures up to 3.5 mm. Common bile duct: Diameter: 9 mm Liver: An echogenic mass in the liver measures up to 16 mm. No definitive correlate on the October 05, 2014 CT scan. Incidentally, a right pleural effusion is identified. IMPRESSION: 1. Significant sludge in the gallbladder. No definitive stones are visualized today but suspected small stones were seen on a CT scan in 2016. The gallbladder wall is mildly thickened. No Murphy's sign or pericholecystic fluid identified. 2. The common bile duct is dilated measuring up to 9 mm. Consider an MRCP to evaluate for choledocholithiasis. Electronically Signed   By:  Dorise Bullion III M.D   On: 01/05/2017 18:58    Labs: BMET  Recent Labs Lab 01/04/17 1322 01/05/17 0357 01/06/17 0944 01/07/17 0540  NA 143 143 145 144  K 4.5 4.6 4.4 4.2  CL 112* 112* 114* 112*  CO2 21* 23 21* 21*  GLUCOSE 237* 265* 153* 136*  BUN 71* 68* 67* 67*  CREATININE 3.54* 3.65* 3.84* 3.90*  CALCIUM 10.1 9.7 9.7 9.6   CBC  Recent Labs Lab 01/04/17 1744 01/05/17 0357 01/06/17 0944 01/07/17 0540  WBC 7.1 7.7 8.4 7.0  NEUTROABS  --  6.0  --   --   HGB 8.3* 7.7* 8.5* 7.9*  HCT 26.2* 24.8* 27.7* 25.7*  MCV 92.3 92.9 95.8 93.1  PLT 233 204 196 185    Medications:    . aspirin  81 mg Oral Daily  . carvedilol  3.125 mg Oral BID WC  . cholecalciferol  5,000 Units Oral QPM  .  furosemide  120 mg Intravenous TID  . insulin aspart  0-15 Units Subcutaneous TID WC  . insulin aspart  0-5 Units Subcutaneous QHS  . insulin glargine  8 Units Subcutaneous Daily  . isosorbide-hydrALAZINE  1 tablet Oral TID  . latanoprost  1 drop Both Eyes QPM  . levothyroxine  75 mcg Oral QAC breakfast  . minoxidil  5 mg Oral Daily  . multivitamin with minerals  1 tablet Oral Daily  . omega-3 acid ethyl esters  1 g Oral Daily  . sertraline  100 mg Oral Daily  . sodium chloride flush  10-40 mL Intracatheter Q12H  . sodium chloride flush  3 mL Intravenous Q12H  . timolol  1 drop Both Eyes BID  . vitamin B-12  1,000 mcg Oral QODAY      Madelon Lips, MD 01/07/2017, 12:28 PM

## 2017-01-07 NOTE — Progress Notes (Signed)
Progress Note  Patient Name: Alyssa [name] Date of Encounter: 01/07/2017  Primary Cardiologist: Dr Claiborne Billings  Subjective   Denies CP; dyspnea improving  Inpatient Medications    Scheduled Meds: . aspirin  81 mg Oral Daily  . carvedilol  3.125 mg Oral BID WC  . cholecalciferol  5,000 Units Oral QPM  . furosemide  120 mg Intravenous TID  . insulin aspart  0-15 Units Subcutaneous TID WC  . insulin aspart  0-5 Units Subcutaneous QHS  . insulin glargine  8 Units Subcutaneous Daily  . isosorbide-hydrALAZINE  1 tablet Oral TID  . latanoprost  1 drop Both Eyes QPM  . levothyroxine  75 mcg Oral QAC breakfast  . minoxidil  5 mg Oral Daily  . multivitamin with minerals  1 tablet Oral Daily  . omega-3 acid ethyl esters  1 g Oral Daily  . sertraline  100 mg Oral Daily  . sodium chloride flush  10-40 mL Intracatheter Q12H  . sodium chloride flush  3 mL Intravenous Q12H  . timolol  1 drop Both Eyes BID  . vitamin B-12  1,000 mcg Oral QODAY   Continuous Infusions: . sodium chloride     PRN Meds: sodium chloride, acetaminophen, ondansetron (ZOFRAN) IV, sodium chloride flush, sodium chloride flush   Vital Signs    Vitals:   01/06/17 1724 01/06/17 1948 01/07/17 0533 01/07/17 1200  BP: (!) 174/78 (!) 121/57 (!) 129/44 (!) 110/56  Pulse: 65 68 68 62  Resp:  18 18   Temp:  97.8 F (36.6 C) 97.5 F (36.4 C)   TempSrc:  Oral Oral   SpO2: 98% 94% 95% 97%  Weight:   108.8 kg (239 lb 13.8 oz)   Height:        Intake/Output Summary (Last 24 hours) at 01/07/17 1304 Last data filed at 01/07/17 1036  Gross per 24 hour  Intake              243 ml  Output             1500 ml  Net            -1257 ml   Filed Weights   01/04/17 2323 01/06/17 0429 01/07/17 0533  Weight: 100.8 kg (222 lb 3.2 oz) 100.3 kg (221 lb 3.2 oz) 108.8 kg (239 lb 13.8 oz)    Telemetry    Sinus bradycardia- Personally Reviewed   Physical Exam   GEN: obese, No acute distress.   Neck: supple Cardiac:  RRR Respiratory: Clear to auscultation bilaterally. GI: Soft, nontender, non-distended  MS: Trace to 1+ edema Neuro:  Nonfocal    Labs    Chemistry Recent Labs Lab 01/05/17 0357 01/06/17 0944 01/07/17 0540  NA 143 145 144  K 4.6 4.4 4.2  CL 112* 114* 112*  CO2 23 21* 21*  GLUCOSE 265* 153* 136*  BUN 68* 67* 67*  CREATININE 3.65* 3.84* 3.90*  CALCIUM 9.7 9.7 9.6  PROT 5.9* 6.0* 5.5*  ALBUMIN 3.4* 3.3* 3.3*  AST 53* 39 25  ALT 152* 115* 89*  ALKPHOS 81 73 63  BILITOT 0.5 0.6 0.7  GFRNONAA 11* 11* 11*  GFRAA 13* 13* 12*  ANIONGAP 8 10 11      Hematology Recent Labs Lab 01/05/17 0357 01/06/17 0944 01/07/17 0540  WBC 7.7 8.4 7.0  RBC 2.67* 2.89* 2.76*  HGB 7.7* 8.5* 7.9*  HCT 24.8* 27.7* 25.7*  MCV 92.9 95.8 93.1  MCH 28.8 29.4 28.6  MCHC 31.0 30.7  30.7  RDW 16.6* 17.5* 17.3*  PLT 204 196 185    Cardiac Enzymes Recent Labs Lab 01/04/17 1744 01/04/17 2144 01/05/17 0357 01/05/17 1116  TROPONINI 0.06* 0.07* 0.06* 0.06*    BNP Recent Labs Lab 01/04/17 1744 01/05/17 0357  BNP 899.1* 1,317.6*       Radiology    Dg Chest 2 View  Result Date: 01/07/2017 CLINICAL DATA:  Chronic diastolic congestive heart failure. EXAM: CHEST  2 VIEW COMPARISON:  CT chest 01/04/2017 and chest radiograph 01/11/2015. FINDINGS: Trachea is midline. Heart is enlarged. Basilar dependent mixed interstitial and airspace opacification with consolidation in the right lower lobe. Small bilateral pleural effusions. Degenerative changes in shoulders. IMPRESSION: 1. Congestive heart failure. 2. Consolidation in the right lower lobe. Pneumonia cannot be excluded. Electronically Signed   By: Lorin Picket M.D.   On: 01/07/2017 07:38   US Abdomen Limited Ruq  Result Date: 01/05/2017 CLINICAL DATA:  Abnormal liver functions EXAM: ULTRASOUND ABDOMEN LIMITED RIGHT UPPER QUADRANT COMPARISON:  CT scan October 05, 2014 FINDINGS: Gallbladder: Sludge is seen in the gallbladder with no shadowing  to definitively suggest stones. No Murphy's sign or pericholecystic fluid. The gallbladder wall measures up to 3.5 mm. Common bile duct: Diameter: 9 mm Liver: An echogenic mass in the liver measures up to 16 mm. No definitive correlate on the October 05, 2014 CT scan. Incidentally, a right pleural effusion is identified. IMPRESSION: 1. Significant sludge in the gallbladder. No definitive stones are visualized today but suspected small stones were seen on a CT scan in 2016. The gallbladder wall is mildly thickened. No Murphy's sign or pericholecystic fluid identified. 2. The common bile duct is dilated measuring up to 9 mm. Consider an MRCP to evaluate for choledocholithiasis. Electronically Signed   By: Dorise Bullion III M.D   On: 01/05/2017 18:58    Patient Profile     71 y.o. female admitted with acute on chronic diastolic congestive heart failure and acute on chronic kidney disease. Echocardiogram shows normal LV systolic function, severe left ventricular hypertrophy, grade 1 diastolic dysfunction and biatrial enlargement.  Assessment & Plan    1 acute on chronic diastolic congestive heart failure-I/O - 1260. Volume status appears to be improving. Continue present dose of IV Lasix. Follow renal function closely.  2 acute on chronic stage IV kidney disease-nephrology is following. Creatinine is stable today. Continue present dose of Lasix. ARB has been discontinued.  3 mildly elevated troponin-no clear trend and no chest pain. Not consistent with acute coronary syndrome. No further ischemia evaluation.  4 hypertension-blood pressure is controlled with present medications. Will continue.  5 right pleural effusion-she has had previous thoracentesis but I do not think this is necessary at this point. We will continue to diurese and follow.  Signed, Kirk Ruths, MD  01/07/2017, 1:04 PM

## 2017-01-07 NOTE — Progress Notes (Addendum)
PROGRESS NOTE                                                                                                                                                                                                             Patient Demographics:    Alyssa [name], is a 71 y.o. female, DOB - [birth year], LSL:[id number]  Admit date - 01/04/2017   Admitting Physician Benito Mccreedy, MD  Outpatient Primary MD for the patient is Patient, No Pcp Per  LOS - 1  Outpatient Specialists: none  Chief Complaint  Patient presents with  . Abdominal Pain       Brief Narrative   71 year old female with history of type 2 diabetes mellitus, hypertension, hyperlipidemia presented feeling poorly for several days. Reported easy fatigability and shortness of breath on exertion. Denied fever, chills or travel. Denies recent illness. Patient has not been seeing a PCP last few months after having a disagreement regarding her care. Patient found to have acute on chronic CHF with right-sided moderate pleural effusion, acute on chronic kidney disease stage III and anemia. Cardiology and nephrology following.   Subjective:   Patient reports her shortness of breath to have unchanged. Still feels weak. Denies chest pain or palpitation. Discussed with daughter at bedside.   Assessment  & Plan :   Principal problem Acute on chronic diastolic  CHF. 2-D echo with EF of 50-55%. Has not been seen by cardiology since 2015. Placed on aggressive diuresis (80 mg IV Lasix 3 times a day). Strict I/O and daily weight. Continue aspirin and Coreg Coreg, added BiDil. Holding ARB due to worsening renal function.  Cardiology consult appreciated.  Acute on chronic kidney disease stage III Possibly associated with diabetic nephropathy and medication nonadherence. Patient was referred to Dr. Joelyn Oms by her PCP in the past. Creatinine worsened from baseline of around 1.6, 6  months back. Patient may be progressing towards dialysis. -Place and aggressive diureses by nephrology. Renal function mildly worsened further this morning patient diuresing well. Continue to monitor.  Moderate right pleural effusion Secondary to CHF. Possibly will improve with diuresis. Hold off on thoracentesis.  Elevated troponin Possibly associated with acute CHF. No chest pain symptoms or EKG changes.  Uncontrolled type 2 diabetes mellitus with hyperglycemia A1c of 7.4. Patient not following with current PCP due to disagreement regarding her plan of care.  Continue Lantus with sliding scale coverage.  Transaminitis Ultrasound abdomen shows gallbladder sludge and CBD dilated measuring up to 9 mm. No Murphy's sign. LFTs slowly improving. Will hold off on MRCP. Discontinue statin.  Hypothyroidism Elevated TSH. Increased Synthroid dose.  Anemia of chronic kidney disease. Colonoscopy in 2015 showed sessile polyps. FOBT negative. CT abdomen and pelvis unremarkable. Iron panel and B12 normal.  Hypomagnesemia Replenished.     Code Status : Full code  Family Communication  : Daughter at bedside  Disposition: Pending hospital course.  Barriers For Discharge : Active symptoms  Consults  :   Cardiology Nephrology  Procedures  : 2-D echo CT chest Renal ultrasound  DVT Prophylaxis  :  Heparin  Lab Results  Component Value Date   PLT 185 01/07/2017    Antibiotics  :    Anti-infectives    None        Objective:   Vitals:   01/06/17 1724 01/06/17 1948 01/07/17 0533 01/07/17 1200  BP: (!) 174/78 (!) 121/57 (!) 129/44 (!) 110/56  Pulse: 65 68 68 62  Resp:  18 18   Temp:  97.8 F (36.6 C) 97.5 F (36.4 C)   TempSrc:  Oral Oral   SpO2: 98% 94% 95% 97%  Weight:   108.8 kg (239 lb 13.8 oz)   Height:        Wt Readings from Last 3 Encounters:  01/07/17 108.8 kg (239 lb 13.8 oz)  09/19/16 90.1 kg (198 lb 9.6 oz)  09/06/16 94.3 kg (208 lb)     Intake/Output  Summary (Last 24 hours) at 01/07/17 1203 Last data filed at 01/07/17 1036  Gross per 24 hour  Intake              243 ml  Output             1500 ml  Net            -1257 ml     Physical Exam Gen.: Elderly female not in distress, has flat affect HEENT: Moist mucosa, supple neck  chest: Diminished right-sided breath sounds, no added sounds CVS: Normal S1 and S2, no murmurs GI: Soft, nondistended, nontender Musculoskeletal: 1+ pitting edema bilaterally      Data Review:    CBC  Recent Labs Lab 01/04/17 1744 01/05/17 0357 01/06/17 0944 01/07/17 0540  WBC 7.1 7.7 8.4 7.0  HGB 8.3* 7.7* 8.5* 7.9*  HCT 26.2* 24.8* 27.7* 25.7*  PLT 233 204 196 185  MCV 92.3 92.9 95.8 93.1  MCH 29.2 28.8 29.4 28.6  MCHC 31.7 31.0 30.7 30.7  RDW 16.5* 16.6* 17.5* 17.3*  LYMPHSABS  --  1.2  --   --   MONOABS  --  0.3  --   --   EOSABS  --  0.1  --   --   BASOSABS  --  0.0  --   --     Chemistries   Recent Labs Lab 01/04/17 1322 01/05/17 0357 01/06/17 0944 01/07/17 0540  NA 143 143 145 144  K 4.5 4.6 4.4 4.2  CL 112* 112* 114* 112*  CO2 21* 23 21* 21*  GLUCOSE 237* 265* 153* 136*  BUN 71* 68* 67* 67*  CREATININE 3.54* 3.65* 3.84* 3.90*  CALCIUM 10.1 9.7 9.7 9.6  MG  --  1.6*  --   --   AST 81* 53* 39 25  ALT 192* 152* 115* 89*  ALKPHOS 88 81 73 63  BILITOT 0.6 0.5 0.6 0.7   ------------------------------------------------------------------------------------------------------------------  Recent Labs  01/05/17 0357  CHOL 156  HDL 58  LDLCALC 86  TRIG 58  CHOLHDL 2.7    Lab Results  Component Value Date   HGBA1C 7.4 (H) 01/04/2017   ------------------------------------------------------------------------------------------------------------------  Recent Labs  01/05/17 0357  TSH 6.020*   ------------------------------------------------------------------------------------------------------------------  Recent Labs  01/04/17 2144  VITAMINB12 1,735*    FOLATE 47.6  FERRITIN 187  TIBC 273  IRON 40  RETICCTPCT 1.3    Coagulation profile No results for input(s): INR, PROTIME in the last 168 hours.  No results for input(s): DDIMER in the last 72 hours.  Cardiac Enzymes  Recent Labs Lab 01/04/17 2144 01/05/17 0357 01/05/17 1116  TROPONINI 0.07* 0.06* 0.06*   ------------------------------------------------------------------------------------------------------------------    Component Value Date/Time   BNP 1,317.6 (H) 01/05/2017 0357   BNP 36.6 10/29/2013 1828    Inpatient Medications  Scheduled Meds: . aspirin  81 mg Oral Daily  . carvedilol  3.125 mg Oral BID WC  . cholecalciferol  5,000 Units Oral QPM  . furosemide  120 mg Intravenous TID  . insulin aspart  0-15 Units Subcutaneous TID WC  . insulin aspart  0-5 Units Subcutaneous QHS  . insulin glargine  8 Units Subcutaneous Daily  . isosorbide-hydrALAZINE  1 tablet Oral TID  . latanoprost  1 drop Both Eyes QPM  . levothyroxine  75 mcg Oral QAC breakfast  . minoxidil  5 mg Oral Daily  . multivitamin with minerals  1 tablet Oral Daily  . omega-3 acid ethyl esters  1 g Oral Daily  . sertraline  100 mg Oral Daily  . sodium chloride flush  10-40 mL Intracatheter Q12H  . sodium chloride flush  3 mL Intravenous Q12H  . timolol  1 drop Both Eyes BID  . vitamin B-12  1,000 mcg Oral QODAY   Continuous Infusions: . sodium chloride     PRN Meds:.sodium chloride, acetaminophen, ondansetron (ZOFRAN) IV, sodium chloride flush, sodium chloride flush  Micro Results No results found for this or any previous visit (from the past 240 hour(s)).  Radiology Reports Ct Abdomen Pelvis Wo Contrast  Result Date: 01/04/2017 CLINICAL DATA:  Dizziness.  Abdominal pain with nausea and vomiting. EXAM: CT CHEST, ABDOMEN AND PELVIS WITHOUT CONTRAST TECHNIQUE: Multidetector CT imaging of the chest, abdomen and pelvis was performed following the standard protocol without IV contrast.  COMPARISON:  None. FINDINGS: CT CHEST FINDINGS Cardiovascular: Cardiomegaly with small volume pericardial fluid. Enlarged main pulmonary artery at 4 cm, usually seen with pulmonary hypertension. Diffuse atherosclerotic calcification of the coronaries. Multifocal aortic and great vessel calcification. No acute vascular finding. Mediastinum/Nodes: Hilar enlargement it appears vascular. No adenopathy noted. Negative esophagus. Lungs/Pleura: Moderate layering pleural effusions, greater on the right. Multi segment atelectasis. The aerated lung shows no edema. No pneumothorax. Musculoskeletal: See below CT ABDOMEN PELVIS FINDINGS Hepatobiliary: No focal liver abnormality.Subtle layering calculi. No inflammatory changes in the biliary tree. Normal common bile duct diameter. Pancreas: Unremarkable. Spleen: Unremarkable. Adrenals/Urinary Tract: Negative adrenals. No hydronephrosis or stone. Hilar calcifications appear atherosclerotic. Unremarkable bladder. Stomach/Bowel: No obstruction. No appendicitis. Extensive sigmoid diverticulosis. Vascular/Lymphatic: Diffuse atherosclerotic calcification. No noted adenopathy. No mass or adenopathy. Reproductive:Fibroid uterus with 4 cm calcified/ hyalinized fibroid. Other: No ascites or pneumoperitoneum.  Anasarca. Musculoskeletal: No acute finding. Severe lumbar facet arthropathy. Status post L4-5 fusion. L5-S1 ankylosis or fusion. Extensive thoracic spondylosis with multi-level ankylosis. Advanced glenohumeral osteoarthritis. Due to patient size, some of the abdominal wall is not visible. IMPRESSION: 1. Cardiomegaly with volume overload. Moderate right  and small left pleural effusions with multi segment atelectasis. Extensive anasarca. 2. Probable pulmonary hypertension. 3. Extensive colonic diverticulosis. 4. Cholelithiasis. Electronically Signed   By: Monte Fantasia M.D.   On: 01/04/2017 20:04   Dg Chest 2 View  Result Date: 01/07/2017 CLINICAL DATA:  Chronic diastolic  congestive heart failure. EXAM: CHEST  2 VIEW COMPARISON:  CT chest 01/04/2017 and chest radiograph 01/11/2015. FINDINGS: Trachea is midline. Heart is enlarged. Basilar dependent mixed interstitial and airspace opacification with consolidation in the right lower lobe. Small bilateral pleural effusions. Degenerative changes in shoulders. IMPRESSION: 1. Congestive heart failure. 2. Consolidation in the right lower lobe. Pneumonia cannot be excluded. Electronically Signed   By: Lorin Picket M.D.   On: 01/07/2017 07:38   Ct Head Wo Contrast  Result Date: 01/04/2017 CLINICAL DATA:  Dizziness and weakness. EXAM: CT HEAD WITHOUT CONTRAST TECHNIQUE: Contiguous axial images were obtained from the base of the skull through the vertex without intravenous contrast. COMPARISON:  10/12/2013 head CT, MRI 01/13/2014 FINDINGS: Brain: Chronic moderate superficial atrophy with small vessel ischemic disease of periventricular and subcortical white matter. No acute intracranial hemorrhage, midline shift or edema. No extra-axial fluid collections. The basal cisterns and fourth ventricle are midline. Vascular: Atherosclerotic calcifications of the vertebral arteries and cavernous sinus carotids. No hyperdense vessels. Skull: Negative for fracture or focal lesions. Sinuses/Orbits: No acute finding. Other: None. IMPRESSION: Chronic moderate superficial atrophy with small vessel ischemic disease. No acute intracranial abnormality. Electronically Signed   By: Ashley Royalty M.D.   On: 01/04/2017 19:47   Ct Chest Wo Contrast  Result Date: 01/04/2017 CLINICAL DATA:  Dizziness.  Abdominal pain with nausea and vomiting. EXAM: CT CHEST, ABDOMEN AND PELVIS WITHOUT CONTRAST TECHNIQUE: Multidetector CT imaging of the chest, abdomen and pelvis was performed following the standard protocol without IV contrast. COMPARISON:  None. FINDINGS: CT CHEST FINDINGS Cardiovascular: Cardiomegaly with small volume pericardial fluid. Enlarged main pulmonary  artery at 4 cm, usually seen with pulmonary hypertension. Diffuse atherosclerotic calcification of the coronaries. Multifocal aortic and great vessel calcification. No acute vascular finding. Mediastinum/Nodes: Hilar enlargement it appears vascular. No adenopathy noted. Negative esophagus. Lungs/Pleura: Moderate layering pleural effusions, greater on the right. Multi segment atelectasis. The aerated lung shows no edema. No pneumothorax. Musculoskeletal: See below CT ABDOMEN PELVIS FINDINGS Hepatobiliary: No focal liver abnormality.Subtle layering calculi. No inflammatory changes in the biliary tree. Normal common bile duct diameter. Pancreas: Unremarkable. Spleen: Unremarkable. Adrenals/Urinary Tract: Negative adrenals. No hydronephrosis or stone. Hilar calcifications appear atherosclerotic. Unremarkable bladder. Stomach/Bowel: No obstruction. No appendicitis. Extensive sigmoid diverticulosis. Vascular/Lymphatic: Diffuse atherosclerotic calcification. No noted adenopathy. No mass or adenopathy. Reproductive:Fibroid uterus with 4 cm calcified/ hyalinized fibroid. Other: No ascites or pneumoperitoneum.  Anasarca. Musculoskeletal: No acute finding. Severe lumbar facet arthropathy. Status post L4-5 fusion. L5-S1 ankylosis or fusion. Extensive thoracic spondylosis with multi-level ankylosis. Advanced glenohumeral osteoarthritis. Due to patient size, some of the abdominal wall is not visible. IMPRESSION: 1. Cardiomegaly with volume overload. Moderate right and small left pleural effusions with multi segment atelectasis. Extensive anasarca. 2. Probable pulmonary hypertension. 3. Extensive colonic diverticulosis. 4. Cholelithiasis. Electronically Signed   By: Monte Fantasia M.D.   On: 01/04/2017 20:04   US Abdomen Limited Ruq  Result Date: 01/05/2017 CLINICAL DATA:  Abnormal liver functions EXAM: ULTRASOUND ABDOMEN LIMITED RIGHT UPPER QUADRANT COMPARISON:  CT scan October 05, 2014 FINDINGS: Gallbladder: Sludge is seen in  the gallbladder with no shadowing to definitively suggest stones. No Murphy's sign or pericholecystic fluid. The gallbladder wall  measures up to 3.5 mm. Common bile duct: Diameter: 9 mm Liver: An echogenic mass in the liver measures up to 16 mm. No definitive correlate on the October 05, 2014 CT scan. Incidentally, a right pleural effusion is identified. IMPRESSION: 1. Significant sludge in the gallbladder. No definitive stones are visualized today but suspected small stones were seen on a CT scan in 2016. The gallbladder wall is mildly thickened. No Murphy's sign or pericholecystic fluid identified. 2. The common bile duct is dilated measuring up to 9 mm. Consider an MRCP to evaluate for choledocholithiasis. Electronically Signed   By: Dorise Bullion III M.D   On: 01/05/2017 18:58    Time Spent in minutes 35   Louellen Molder M.D on 01/07/2017 at 12:03 PM  Between 7am to 7pm - Pager - [phone]  After 7pm go to [email or web address] - password [facility]  [facility] -  Office  [phone]

## 2017-01-07 NOTE — Progress Notes (Signed)
Patient ambulated hallway on room air and desaturated to 87%.  2L oxygen placed on patient.  Will continue to monitor.

## 2017-01-08 DIAGNOSIS — N183 Chronic kidney disease, stage 3 unspecified: Secondary | ICD-10-CM

## 2017-01-08 DIAGNOSIS — I5033 Acute on chronic diastolic (congestive) heart failure: Secondary | ICD-10-CM

## 2017-01-08 DIAGNOSIS — N170 Acute kidney failure with tubular necrosis: Secondary | ICD-10-CM

## 2017-01-08 LAB — GLUCOSE, CAPILLARY
GLUCOSE-CAPILLARY: 293 mg/dL — AB (ref 65–99)
GLUCOSE-CAPILLARY: 202 mg/dL — AB (ref 65–99)
Glucose-Capillary: 129 mg/dL — ABNORMAL HIGH (ref 65–99)
Glucose-Capillary: 153 mg/dL — ABNORMAL HIGH (ref 65–99)

## 2017-01-08 LAB — BASIC METABOLIC PANEL
Anion gap: 11 (ref 5–15)
BUN: 69 mg/dL — ABNORMAL HIGH (ref 6–20)
CALCIUM: 9.3 mg/dL (ref 8.9–10.3)
CO2: 21 mmol/L — ABNORMAL LOW (ref 22–32)
CREATININE: 4.21 mg/dL — AB (ref 0.44–1.00)
Chloride: 111 mmol/L (ref 101–111)
GFR calc non Af Amer: 10 mL/min — ABNORMAL LOW (ref >60)
GFR, EST AFRICAN AMERICAN: 11 mL/min — AB (ref >60)
Glucose, Bld: 138 mg/dL — ABNORMAL HIGH (ref 65–99)
Potassium: 4.3 mmol/L (ref 3.5–5.1)
SODIUM: 143 mmol/L (ref 135–145)

## 2017-01-08 LAB — HEPATIC FUNCTION PANEL
ALBUMIN: 3.1 g/dL — AB (ref 3.5–5.0)
ALT: 73 U/L — ABNORMAL HIGH (ref 14–54)
AST: 21 U/L (ref 15–41)
Alkaline Phosphatase: 62 U/L (ref 38–126)
Bilirubin, Direct: <0.1 mg/dL — ABNORMAL LOW (ref 0.1–0.5)
TOTAL PROTEIN: 5.5 g/dL — AB (ref 6.5–8.1)
Total Bilirubin: 0.3 mg/dL (ref 0.3–1.2)

## 2017-01-08 MED ORDER — SODIUM CHLORIDE 0.9 % IV SOLN
510.0000 mg | INTRAVENOUS | Status: AC
  Administered 2017-01-08: 510 mg via INTRAVENOUS
  Filled 2017-01-08: qty 17

## 2017-01-08 MED ORDER — DARBEPOETIN ALFA 60 MCG/0.3ML IJ SOSY
60.0000 ug | PREFILLED_SYRINGE | INTRAMUSCULAR | Status: DC
  Administered 2017-01-08: 60 ug via SUBCUTANEOUS
  Filled 2017-01-08: qty 0.3

## 2017-01-08 MED ORDER — INSULIN ASPART 100 UNIT/ML ~~LOC~~ SOLN
3.0000 [IU] | Freq: Three times a day (TID) | SUBCUTANEOUS | Status: DC
  Administered 2017-01-08 – 2017-01-12 (×11): 3 [IU] via SUBCUTANEOUS

## 2017-01-08 MED ORDER — FUROSEMIDE 10 MG/ML IJ SOLN
120.0000 mg | Freq: Two times a day (BID) | INTRAVENOUS | Status: AC
  Administered 2017-01-08 – 2017-01-09 (×3): 120 mg via INTRAVENOUS
  Filled 2017-01-08: qty 10
  Filled 2017-01-08: qty 2
  Filled 2017-01-08: qty 10

## 2017-01-08 MED ORDER — FUROSEMIDE 10 MG/ML IJ SOLN: 120.0000 mg | Freq: Two times a day (BID) | INTRAMUSCULAR | Status: DC

## 2017-01-08 NOTE — Progress Notes (Signed)
Progress Note  Patient Name: Alyssa [name] Date of Encounter: 01/08/2017  Primary Cardiologist: Dr Claiborne Billings  Subjective   No chest pain or dyspea  Inpatient Medications    Scheduled Meds: . aspirin  81 mg Oral Daily  . carvedilol  3.125 mg Oral BID WC  . cholecalciferol  5,000 Units Oral QPM  . darbepoetin (ARANESP) injection - NON-DIALYSIS  60 mcg Subcutaneous Q Tue-1800  . insulin aspart  0-15 Units Subcutaneous TID WC  . insulin aspart  0-5 Units Subcutaneous QHS  . insulin glargine  8 Units Subcutaneous Daily  . isosorbide-hydrALAZINE  1 tablet Oral TID  . latanoprost  1 drop Both Eyes QPM  . levothyroxine  75 mcg Oral QAC breakfast  . minoxidil  5 mg Oral Daily  . multivitamin with minerals  1 tablet Oral Daily  . omega-3 acid ethyl esters  1 g Oral Daily  . sodium chloride flush  10-40 mL Intracatheter Q12H  . sodium chloride flush  3 mL Intravenous Q12H  . timolol  1 drop Both Eyes BID  . vitamin B-12  1,000 mcg Oral QODAY   Continuous Infusions: . sodium chloride    . ferumoxytol    . furosemide     PRN Meds: sodium chloride, acetaminophen, ondansetron (ZOFRAN) IV, sodium chloride flush, sodium chloride flush   Vital Signs    Vitals:   01/07/17 2033 01/08/17 0231 01/08/17 0400 01/08/17 0600  BP: (!) 115/53  (!) 98/50 (!) 138/52  Pulse: 64  81 71  Resp: 18  18   Temp: 97.8 F (36.6 C)  97.9 F (36.6 C)   TempSrc: Oral  Oral   SpO2: 100%  100%   Weight:  99.4 kg (219 lb 3.2 oz)    Height:        Intake/Output Summary (Last 24 hours) at 01/08/17 1039 Last data filed at 01/08/17 0954  Gross per 24 hour  Intake              360 ml  Output             1550 ml  Net            -1190 ml   Filed Weights   01/06/17 0429 01/07/17 0533 01/08/17 0231  Weight: 100.3 kg (221 lb 3.2 oz) 108.8 kg (239 lb 13.8 oz) 99.4 kg (219 lb 3.2 oz)    Telemetry    Sinus bradycardia with rare PVC- Personally Reviewed   Physical Exam   GEN: WD/obese, No acute  distress.   Neck: No JVD Cardiac: RRR no murmur Respiratory: CTA GI: Soft, nontender, non-distended, no masses MS: No edema Neuro:  Grossly intact   Labs    Chemistry  Recent Labs Lab 01/06/17 0944 01/07/17 0540 01/08/17 0735  NA 145 144 143  K 4.4 4.2 4.3  CL 114* 112* 111  CO2 21* 21* 21*  GLUCOSE 153* 136* 138*  BUN 67* 67* 69*  CREATININE 3.84* 3.90* 4.21*  CALCIUM 9.7 9.6 9.3  PROT 6.0* 5.5* 5.5*  ALBUMIN 3.3* 3.3* 3.1*  AST 39 25 21  ALT 115* 89* 73*  ALKPHOS 73 63 62  BILITOT 0.6 0.7 0.3  GFRNONAA 11* 11* 10*  GFRAA 13* 12* 11*  ANIONGAP 10 11 11      Hematology  Recent Labs Lab 01/05/17 0357 01/06/17 0944 01/07/17 0540  WBC 7.7 8.4 7.0  RBC 2.67* 2.89* 2.76*  HGB 7.7* 8.5* 7.9*  HCT 24.8* 27.7* 25.7*  MCV 92.9  95.8 93.1  MCH 28.8 29.4 28.6  MCHC 31.0 30.7 30.7  RDW 16.6* 17.5* 17.3*  PLT 204 196 185    Cardiac Enzymes  Recent Labs Lab 01/04/17 1744 01/04/17 2144 01/05/17 0357 01/05/17 1116  TROPONINI 0.06* 0.07* 0.06* 0.06*    BNP  Recent Labs Lab 01/04/17 1744 01/05/17 0357  BNP 899.1* 1,317.6*       Radiology    Dg Chest 2 View  Result Date: 01/07/2017 CLINICAL DATA:  Chronic diastolic congestive heart failure. EXAM: CHEST  2 VIEW COMPARISON:  CT chest 01/04/2017 and chest radiograph 01/11/2015. FINDINGS: Trachea is midline. Heart is enlarged. Basilar dependent mixed interstitial and airspace opacification with consolidation in the right lower lobe. Small bilateral pleural effusions. Degenerative changes in shoulders. IMPRESSION: 1. Congestive heart failure. 2. Consolidation in the right lower lobe. Pneumonia cannot be excluded. Electronically Signed   By: Lorin Picket M.D.   On: 01/07/2017 07:38    Patient Profile     71 y.o. female admitted with acute on chronic diastolic congestive heart failure and acute on chronic kidney disease. Echocardiogram shows normal LV systolic function, severe left ventricular hypertrophy,  grade 1 diastolic dysfunction and biatrial enlargement.  Assessment & Plan    1 acute on chronic diastolic congestive heart failure-I/O - 447. Volume status continues to improve and she appears to be approaching euvolemia. Continue present dose of Lasix and follow renal function. Would begin to ambulate to see if symptoms with activity are improved.  2 acute on chronic stage IV kidney disease-nephrology is following. Renal function is slightly worse today. She may be nearing dialysis.  3 mildly elevated troponin-no clear trend and no chest pain. Not consistent with acute coronary syndrome. No further ischemia evaluation.  4 hypertension-blood pressure is controlled with present medications. Will continue.  5 right pleural effusion-her symptoms have improved but effusion still present on examination. Continue diuresis. Will not pursue thoracentesis at this point.   Signed, Kirk Ruths, MD  01/08/2017, 10:39 AM

## 2017-01-08 NOTE — Progress Notes (Signed)
Inpatient Diabetes Program Recommendations  AACE/ADA: New Consensus Statement on Inpatient Glycemic Control (2015)  Target Ranges:  Prepandial:   less than 140 mg/dL      Peak postprandial:   less than 180 mg/dL (1-2 hours)      Critically ill patients:  140 - 180 mg/dL   Results for Alyssa, [name] (MRN [id number]) as of 01/08/2017 13:42  Ref. Range 01/07/2017 07:55 01/07/2017 11:33 01/07/2017 16:17 01/07/2017 21:22 01/08/2017 07:42 01/08/2017 12:06  Glucose-Capillary Latest Ref Range: 65 - 99 mg/dL 126 (H) 168 (H) 229 (H) 249 (H) 129 (H) 293 (H)   Review of Glycemic Control  Diabetes history: DM2 Outpatient Diabetes medications: Glipizide 10 mg as needed  Current orders for Inpatient glycemic control: Lantus 8 units daily, Novolog 0-15 units TID with meals, Novolog 0-5 units QHS  Inpatient Diabetes Program Recommendations: Insulin - Meal Coverage: Please consider ordering Novolog 3 units TID with meals for meal coverage if patient eats at least 50% of meals.  Thanks, Barnie Alderman, RN, MSN, CDE Diabetes Coordinator Inpatient Diabetes Program [phone] (Team Pager from 8am to 5pm)

## 2017-01-08 NOTE — Progress Notes (Addendum)
PROGRESS NOTE                                                                                                                                                                                                             Patient Demographics:    Alyssa [name], is a 71 y.o. female, DOB - 1945/12/09, GUR:[id number]  Admit date - 01/04/2017   Admitting Physician Benito Mccreedy, MD  Outpatient Primary MD for the patient is Patient, No Pcp Per  LOS - 2  Outpatient Specialists: none  Chief Complaint  Patient presents with  . Abdominal Pain       Brief Narrative   71 year old female with history of type 2 diabetes mellitus, hypertension, hyperlipidemia presented feeling poorly for several days. Reported easy fatigability and shortness of breath on exertion. Denied fever, chills or travel. Denies recent illness. Patient has not been seeing a PCP last few months after having a disagreement regarding her care. Patient found to have acute on chronic CHF with right-sided moderate pleural effusion, acute on chronic kidney disease stage III and anemia. Cardiology and nephrology following.   Subjective:   Denies worsened shortness of breath. No chest pain symptoms.   Assessment  & Plan :   Principal problem Acute on chronic diastolic CHF. 2-D echo with EF of 50-55%. Has not been seen by cardiology since 2015. Continue aggressive diuresis with IV Lasix (80 mg every 8 hours). Urine output not charted properly yesterday. Patient made about 1.1 L since this morning. Continue aspirin and Coreg, added BiDil. Holding ARB due to worsening renal function.  Cardiology following. Encouraged to ambulate.  Acute on chronic kidney disease stage III Nephrology consult appreciated. Suspecting cardiorenal syndrome. diabetic nephropathy and medication nonadherence also likely contributing.    Creatinine worsened from baseline of around 1.6, 6  months back. Patient may be progressing towards dialysis.   Moderate right pleural effusion Secondary to CHF.  Hold off on thoracentesis. No respiratory distress. Hopefully improve with diuresis.  Elevated troponin associated with acute CHF. No chest pain symptoms or EKG changes.  Uncontrolled type 2 diabetes mellitus with hyperglycemia A1c of 7.4. Patient not following with current PCP due to disagreement regarding her plan of care. CBG in high 200s. Continue Lantus. Added pre-meal aspart.  Transaminitis Ultrasound abdomen shows gallbladder sludge and CBD dilated measuring up to 9 mm.  No Murphy's sign. LFTs slowly improving. Will hold off on MRCP. Discontinue statin.  Hypothyroidism Elevated TSH. Increased Synthroid dose.  Anemia of chronic kidney disease. Colonoscopy in 2015 showed sessile polyps. FOBT negative. CT abdomen and pelvis unremarkable. Iron panel and B12 normal. Ordered a dose of  feraheme and aranesp today.  Hypomagnesemia  Replenished.     Code Status : Full code  Family Communication  : Daughter at bedside  Disposition: Pending hospital course.  Barriers For Discharge : Active symptoms  Consults  :   Cardiology Nephrology  Procedures  : 2-D echo CT chest Renal ultrasound  DVT Prophylaxis  :  Heparin  Lab Results  Component Value Date   PLT 185 01/07/2017    Antibiotics  :    Anti-infectives    None        Objective:   Vitals:   01/08/17 0231 01/08/17 0400 01/08/17 0600 01/08/17 1208  BP:  (!) 98/50 (!) 138/52 (!) 110/51  Pulse:  81 71 61  Resp:  18    Temp:  97.9 F (36.6 C)    TempSrc:  Oral    SpO2:  100%  99%  Weight: 99.4 kg (219 lb 3.2 oz)     Height:        Wt Readings from Last 3 Encounters:  01/08/17 99.4 kg (219 lb 3.2 oz)  09/19/16 90.1 kg (198 lb 9.6 oz)  09/06/16 94.3 kg (208 lb)     Intake/Output Summary (Last 24 hours) at 01/08/17 1240 Last data filed at 01/08/17 0954  Gross per 24 hour  Intake               360 ml  Output             1550 ml  Net            -1190 ml     Physical Exam Gen.: Elderly female, flat affect, not in distress HEENT: Moist mucosa, supple neck Chest: Improved right-sided breath sounds, no added sounds CVS: Normal S1 and S2, no murmurs GI: Soft, nondistended, nontender Musculoskeletal: Trace pitting edema bilaterally       Data Review:    CBC  Recent Labs Lab 01/04/17 1744 01/05/17 0357 01/06/17 0944 01/07/17 0540  WBC 7.1 7.7 8.4 7.0  HGB 8.3* 7.7* 8.5* 7.9*  HCT 26.2* 24.8* 27.7* 25.7*  PLT 233 204 196 185  MCV 92.3 92.9 95.8 93.1  MCH 29.2 28.8 29.4 28.6  MCHC 31.7 31.0 30.7 30.7  RDW 16.5* 16.6* 17.5* 17.3*  LYMPHSABS  --  1.2  --   --   MONOABS  --  0.3  --   --   EOSABS  --  0.1  --   --   BASOSABS  --  0.0  --   --     Chemistries   Recent Labs Lab 01/04/17 1322 01/05/17 0357 01/06/17 0944 01/07/17 0540 01/08/17 0735  NA 143 143 145 144 143  K 4.5 4.6 4.4 4.2 4.3  CL 112* 112* 114* 112* 111  CO2 21* 23 21* 21* 21*  GLUCOSE 237* 265* 153* 136* 138*  BUN 71* 68* 67* 67* 69*  CREATININE 3.54* 3.65* 3.84* 3.90* 4.21*  CALCIUM 10.1 9.7 9.7 9.6 9.3  MG  --  1.6*  --   --   --   AST 81* 53* 39 25 21  ALT 192* 152* 115* 89* 73*  ALKPHOS 88 81 73 63 62  BILITOT 0.6 0.5 0.6 0.7  0.3   ------------------------------------------------------------------------------------------------------------------ No results for input(s): CHOL, HDL, LDLCALC, TRIG, CHOLHDL, LDLDIRECT in the last 72 hours.  Lab Results  Component Value Date   HGBA1C 7.4 (H) 01/04/2017   ------------------------------------------------------------------------------------------------------------------ No results for input(s): TSH, T4TOTAL, T3FREE, THYROIDAB in the last 72 hours.  Invalid input(s): FREET3 ------------------------------------------------------------------------------------------------------------------ No results for input(s): VITAMINB12,  FOLATE, FERRITIN, TIBC, IRON, RETICCTPCT in the last 72 hours.  Coagulation profile No results for input(s): INR, PROTIME in the last 168 hours.  No results for input(s): DDIMER in the last 72 hours.  Cardiac Enzymes  Recent Labs Lab 01/04/17 2144 01/05/17 0357 01/05/17 1116  TROPONINI 0.07* 0.06* 0.06*   ------------------------------------------------------------------------------------------------------------------    Component Value Date/Time   BNP 1,317.6 (H) 01/05/2017 0357   BNP 36.6 10/29/2013 1828    Inpatient Medications  Scheduled Meds: . aspirin  81 mg Oral Daily  . carvedilol  3.125 mg Oral BID WC  . cholecalciferol  5,000 Units Oral QPM  . darbepoetin (ARANESP) injection - NON-DIALYSIS  60 mcg Subcutaneous Q Tue-1800  . insulin aspart  0-15 Units Subcutaneous TID WC  . insulin aspart  0-5 Units Subcutaneous QHS  . insulin glargine  8 Units Subcutaneous Daily  . isosorbide-hydrALAZINE  1 tablet Oral TID  . latanoprost  1 drop Both Eyes QPM  . levothyroxine  75 mcg Oral QAC breakfast  . minoxidil  5 mg Oral Daily  . multivitamin with minerals  1 tablet Oral Daily  . omega-3 acid ethyl esters  1 g Oral Daily  . sodium chloride flush  10-40 mL Intracatheter Q12H  . sodium chloride flush  3 mL Intravenous Q12H  . timolol  1 drop Both Eyes BID  . vitamin B-12  1,000 mcg Oral QODAY   Continuous Infusions: . sodium chloride    . furosemide     PRN Meds:.sodium chloride, acetaminophen, ondansetron (ZOFRAN) IV, sodium chloride flush, sodium chloride flush  Micro Results No results found for this or any previous visit (from the past 240 hour(s)).  Radiology Reports Ct Abdomen Pelvis Wo Contrast  Result Date: 01/04/2017 CLINICAL DATA:  Dizziness.  Abdominal pain with nausea and vomiting. EXAM: CT CHEST, ABDOMEN AND PELVIS WITHOUT CONTRAST TECHNIQUE: Multidetector CT imaging of the chest, abdomen and pelvis was performed following the standard protocol without  IV contrast. COMPARISON:  None. FINDINGS: CT CHEST FINDINGS Cardiovascular: Cardiomegaly with small volume pericardial fluid. Enlarged main pulmonary artery at 4 cm, usually seen with pulmonary hypertension. Diffuse atherosclerotic calcification of the coronaries. Multifocal aortic and great vessel calcification. No acute vascular finding. Mediastinum/Nodes: Hilar enlargement it appears vascular. No adenopathy noted. Negative esophagus. Lungs/Pleura: Moderate layering pleural effusions, greater on the right. Multi segment atelectasis. The aerated lung shows no edema. No pneumothorax. Musculoskeletal: See below CT ABDOMEN PELVIS FINDINGS Hepatobiliary: No focal liver abnormality.Subtle layering calculi. No inflammatory changes in the biliary tree. Normal common bile duct diameter. Pancreas: Unremarkable. Spleen: Unremarkable. Adrenals/Urinary Tract: Negative adrenals. No hydronephrosis or stone. Hilar calcifications appear atherosclerotic. Unremarkable bladder. Stomach/Bowel: No obstruction. No appendicitis. Extensive sigmoid diverticulosis. Vascular/Lymphatic: Diffuse atherosclerotic calcification. No noted adenopathy. No mass or adenopathy. Reproductive:Fibroid uterus with 4 cm calcified/ hyalinized fibroid. Other: No ascites or pneumoperitoneum.  Anasarca. Musculoskeletal: No acute finding. Severe lumbar facet arthropathy. Status post L4-5 fusion. L5-S1 ankylosis or fusion. Extensive thoracic spondylosis with multi-level ankylosis. Advanced glenohumeral osteoarthritis. Due to patient size, some of the abdominal wall is not visible. IMPRESSION: 1. Cardiomegaly with volume overload. Moderate right and small left pleural effusions with multi  segment atelectasis. Extensive anasarca. 2. Probable pulmonary hypertension. 3. Extensive colonic diverticulosis. 4. Cholelithiasis. Electronically Signed   By: Monte Fantasia M.D.   On: 01/04/2017 20:04   Dg Chest 2 View  Result Date: 01/07/2017 CLINICAL DATA:  Chronic  diastolic congestive heart failure. EXAM: CHEST  2 VIEW COMPARISON:  CT chest 01/04/2017 and chest radiograph 01/11/2015. FINDINGS: Trachea is midline. Heart is enlarged. Basilar dependent mixed interstitial and airspace opacification with consolidation in the right lower lobe. Small bilateral pleural effusions. Degenerative changes in shoulders. IMPRESSION: 1. Congestive heart failure. 2. Consolidation in the right lower lobe. Pneumonia cannot be excluded. Electronically Signed   By: Lorin Picket M.D.   On: 01/07/2017 07:38   Ct Head Wo Contrast  Result Date: 01/04/2017 CLINICAL DATA:  Dizziness and weakness. EXAM: CT HEAD WITHOUT CONTRAST TECHNIQUE: Contiguous axial images were obtained from the base of the skull through the vertex without intravenous contrast. COMPARISON:  10/12/2013 head CT, MRI 01/13/2014 FINDINGS: Brain: Chronic moderate superficial atrophy with small vessel ischemic disease of periventricular and subcortical white matter. No acute intracranial hemorrhage, midline shift or edema. No extra-axial fluid collections. The basal cisterns and fourth ventricle are midline. Vascular: Atherosclerotic calcifications of the vertebral arteries and cavernous sinus carotids. No hyperdense vessels. Skull: Negative for fracture or focal lesions. Sinuses/Orbits: No acute finding. Other: None. IMPRESSION: Chronic moderate superficial atrophy with small vessel ischemic disease. No acute intracranial abnormality. Electronically Signed   By: Ashley Royalty M.D.   On: 01/04/2017 19:47   Ct Chest Wo Contrast  Result Date: 01/04/2017 CLINICAL DATA:  Dizziness.  Abdominal pain with nausea and vomiting. EXAM: CT CHEST, ABDOMEN AND PELVIS WITHOUT CONTRAST TECHNIQUE: Multidetector CT imaging of the chest, abdomen and pelvis was performed following the standard protocol without IV contrast. COMPARISON:  None. FINDINGS: CT CHEST FINDINGS Cardiovascular: Cardiomegaly with small volume pericardial fluid. Enlarged main  pulmonary artery at 4 cm, usually seen with pulmonary hypertension. Diffuse atherosclerotic calcification of the coronaries. Multifocal aortic and great vessel calcification. No acute vascular finding. Mediastinum/Nodes: Hilar enlargement it appears vascular. No adenopathy noted. Negative esophagus. Lungs/Pleura: Moderate layering pleural effusions, greater on the right. Multi segment atelectasis. The aerated lung shows no edema. No pneumothorax. Musculoskeletal: See below CT ABDOMEN PELVIS FINDINGS Hepatobiliary: No focal liver abnormality.Subtle layering calculi. No inflammatory changes in the biliary tree. Normal common bile duct diameter. Pancreas: Unremarkable. Spleen: Unremarkable. Adrenals/Urinary Tract: Negative adrenals. No hydronephrosis or stone. Hilar calcifications appear atherosclerotic. Unremarkable bladder. Stomach/Bowel: No obstruction. No appendicitis. Extensive sigmoid diverticulosis. Vascular/Lymphatic: Diffuse atherosclerotic calcification. No noted adenopathy. No mass or adenopathy. Reproductive:Fibroid uterus with 4 cm calcified/ hyalinized fibroid. Other: No ascites or pneumoperitoneum.  Anasarca. Musculoskeletal: No acute finding. Severe lumbar facet arthropathy. Status post L4-5 fusion. L5-S1 ankylosis or fusion. Extensive thoracic spondylosis with multi-level ankylosis. Advanced glenohumeral osteoarthritis. Due to patient size, some of the abdominal wall is not visible. IMPRESSION: 1. Cardiomegaly with volume overload. Moderate right and small left pleural effusions with multi segment atelectasis. Extensive anasarca. 2. Probable pulmonary hypertension. 3. Extensive colonic diverticulosis. 4. Cholelithiasis. Electronically Signed   By: Monte Fantasia M.D.   On: 01/04/2017 20:04   US Abdomen Limited Ruq  Result Date: 01/05/2017 CLINICAL DATA:  Abnormal liver functions EXAM: ULTRASOUND ABDOMEN LIMITED RIGHT UPPER QUADRANT COMPARISON:  CT scan October 05, 2014 FINDINGS: Gallbladder: Sludge  is seen in the gallbladder with no shadowing to definitively suggest stones. No Murphy's sign or pericholecystic fluid. The gallbladder wall measures up to 3.5 mm. Common bile  duct: Diameter: 9 mm Liver: An echogenic mass in the liver measures up to 16 mm. No definitive correlate on the October 05, 2014 CT scan. Incidentally, a right pleural effusion is identified. IMPRESSION: 1. Significant sludge in the gallbladder. No definitive stones are visualized today but suspected small stones were seen on a CT scan in 2016. The gallbladder wall is mildly thickened. No Murphy's sign or pericholecystic fluid identified. 2. The common bile duct is dilated measuring up to 9 mm. Consider an MRCP to evaluate for choledocholithiasis. Electronically Signed   By: Dorise Bullion III M.D   On: 01/05/2017 18:58    Time Spent in minutes 25   Louellen Molder M.D on 01/08/2017 at 12:40 PM  Between 7am to 7pm - Pager - [phone]  After 7pm go to [email or web address] - password [facility]  [facility] -  Office  [phone]

## 2017-01-08 NOTE — Progress Notes (Signed)
[facility] Progress Note    Assessment/ Plan:   1  Acute / chronic renal failure: I suspect cardiorenal syndrome as primary driver.  Also had pyruria on UA--> PPI d/c'd in case AIN playing a role.  She may be nearing ESRD.  No strong indication at this time but will need to watch closely. She is R handed.  Labs are pending today; would continue current dose of lasix for now at 120 IV TID as long as Cr stable on bloodwork. Is followed by Dr. Joelyn Oms in clinic. 2  Acute/ chron diast CHF / vol overload - as above 3  HTN - cozaar on hold, cont home minoxidil; coreg and Bidil added here. BP's are good.  Do not recommend restarting ACEi/ARB 4  Gout- no active flare 5  Anemia: will give single dose of feraheme today and Aranesp 60 mcg   Subjective:    Feeling even better than yesterday.  Don't think I/O recorded correctly.   Objective:   BP (!) 138/52   Pulse 71   Temp 97.9 F (36.6 C) (Oral)   Resp 18   Ht 5\' 3"  (1.6 m)   Wt 99.4 kg (219 lb 3.2 oz)   SpO2 100%   BMI 38.83 kg/m   Intake/Output Summary (Last 24 hours) at 01/08/17 0831 Last data filed at 01/08/17 4665  Gross per 24 hour  Intake                3 ml  Output              450 ml  Net             -447 ml   Weight change: -9.372 kg (-20 lb 10.6 oz)  Physical Exam: Gen elderly female sitting in bed, eating breakfast, NAD No jvd or bruits Chest: R sided diminished BS, crackles improved RRR question syst M, no RG Abd soft ntnd no mass or ascites +bs obese MS no joint effusions or deformity Ext trace LE edema / no wounds or ulcers Neuro is alert, Ox 3 , nf Imaging: Dg Chest 2 View  Result Date: 01/07/2017 CLINICAL DATA:  Chronic diastolic congestive heart failure. EXAM: CHEST  2 VIEW COMPARISON:  CT chest 01/04/2017 and chest radiograph 01/11/2015. FINDINGS: Trachea is midline. Heart is enlarged. Basilar dependent mixed interstitial and airspace opacification with consolidation in the right lower lobe.  Small bilateral pleural effusions. Degenerative changes in shoulders. IMPRESSION: 1. Congestive heart failure. 2. Consolidation in the right lower lobe. Pneumonia cannot be excluded. Electronically Signed   By: Lorin Picket M.D.   On: 01/07/2017 07:38    Labs: BMET  Recent Labs Lab 01/04/17 1322 01/05/17 0357 01/06/17 0944 01/07/17 0540  NA 143 143 145 144  K 4.5 4.6 4.4 4.2  CL 112* 112* 114* 112*  CO2 21* 23 21* 21*  GLUCOSE 237* 265* 153* 136*  BUN 71* 68* 67* 67*  CREATININE 3.54* 3.65* 3.84* 3.90*  CALCIUM 10.1 9.7 9.7 9.6   CBC  Recent Labs Lab 01/04/17 1744 01/05/17 0357 01/06/17 0944 01/07/17 0540  WBC 7.1 7.7 8.4 7.0  NEUTROABS  --  6.0  --   --   HGB 8.3* 7.7* 8.5* 7.9*  HCT 26.2* 24.8* 27.7* 25.7*  MCV 92.3 92.9 95.8 93.1  PLT 233 204 196 185    Medications:    . aspirin  81 mg Oral Daily  . carvedilol  3.125 mg Oral BID WC  . cholecalciferol  5,000  Units Oral QPM  . darbepoetin (ARANESP) injection - NON-DIALYSIS  60 mcg Subcutaneous Q Tue-1800  . furosemide  120 mg Intravenous TID  . insulin aspart  0-15 Units Subcutaneous TID WC  . insulin aspart  0-5 Units Subcutaneous QHS  . insulin glargine  8 Units Subcutaneous Daily  . isosorbide-hydrALAZINE  1 tablet Oral TID  . latanoprost  1 drop Both Eyes QPM  . levothyroxine  75 mcg Oral QAC breakfast  . minoxidil  5 mg Oral Daily  . multivitamin with minerals  1 tablet Oral Daily  . omega-3 acid ethyl esters  1 g Oral Daily  . sertraline  100 mg Oral Daily  . sodium chloride flush  10-40 mL Intracatheter Q12H  . sodium chloride flush  3 mL Intravenous Q12H  . timolol  1 drop Both Eyes BID  . vitamin B-12  1,000 mcg Oral QODAY      Madelon Lips, MD 01/08/2017, 8:31 AM

## 2017-01-09 LAB — GLUCOSE, CAPILLARY
GLUCOSE-CAPILLARY: 231 mg/dL — AB (ref 65–99)
Glucose-Capillary: 119 mg/dL — ABNORMAL HIGH (ref 65–99)
Glucose-Capillary: 223 mg/dL — ABNORMAL HIGH (ref 65–99)
Glucose-Capillary: 227 mg/dL — ABNORMAL HIGH (ref 65–99)

## 2017-01-09 LAB — BASIC METABOLIC PANEL
ANION GAP: 8 (ref 5–15)
BUN: 72 mg/dL — ABNORMAL HIGH (ref 6–20)
CO2: 26 mmol/L (ref 22–32)
CREATININE: 4.29 mg/dL — AB (ref 0.44–1.00)
Calcium: 9.8 mg/dL (ref 8.9–10.3)
Chloride: 107 mmol/L (ref 101–111)
GFR calc non Af Amer: 9 mL/min — ABNORMAL LOW (ref >60)
GFR, EST AFRICAN AMERICAN: 11 mL/min — AB (ref >60)
Glucose, Bld: 126 mg/dL — ABNORMAL HIGH (ref 65–99)
POTASSIUM: 4 mmol/L (ref 3.5–5.1)
SODIUM: 141 mmol/L (ref 135–145)

## 2017-01-09 MED ORDER — FUROSEMIDE 80 MG PO TABS
120.0000 mg | ORAL_TABLET | Freq: Two times a day (BID) | ORAL | Status: DC
  Administered 2017-01-10 – 2017-01-12 (×5): 120 mg via ORAL
  Filled 2017-01-09 (×5): qty 1

## 2017-01-09 MED ORDER — POLYETHYLENE GLYCOL 3350 17 G PO PACK
17.0000 g | PACK | Freq: Every day | ORAL | Status: DC
  Administered 2017-01-09 – 2017-01-10 (×2): 17 g via ORAL
  Filled 2017-01-09 (×4): qty 1

## 2017-01-09 NOTE — Progress Notes (Addendum)
PROGRESS NOTE                                                                                                                                                                                                             Patient Demographics:    Alyssa [name], is a 71 y.o. female, DOB - 08-Jul-1946, JXB:[id number]  Admit date - 01/04/2017   Admitting Physician Benito Mccreedy, MD  Outpatient Primary MD for the patient is Patient, No Pcp Per  LOS - 3  Outpatient Specialists: none  Chief Complaint  Patient presents with  . Abdominal Pain       Brief Narrative   71 year old female with history of type 2 diabetes mellitus, hypertension, hyperlipidemia presented feeling poorly for several days. Reported easy fatigability and shortness of breath on exertion. Denied fever, chills or travel. Denies recent illness. Patient has not been seeing a PCP last few months after having a disagreement regarding her care. Patient found to have acute on chronic CHF with right-sided moderate pleural effusion, acute on chronic kidney disease stage III and anemia. Cardiology and nephrology following.   Subjective:   Dyspnea unchanged. Has not had bowel movement for several days.   Assessment  & Plan :   Principal problem Acute on chronic systolic CHF. 2-D echo with EF of 50-55%. Has not been seen by cardiology since 2015. Continue current dose of IV Lasix (120 mg twice a day). Has had good urine output past 24 hours and weight down by 6 pounds since admission. Continue aspirin and Coreg. Added BiDil. ARB discontinued due to worsened renal function. Cardiology following. Plan to repeat chest x-ray in a.m. and decide if patient needs thoracentesis.   Acute on chronic kidney disease stage III - Suspecting cardiorenal syndrome. diabetic nephropathy and medication nonadherence also likely contributing.  ? AIN secondary to PPI which is now  discontinued. Creatinine continues to rise (4.29 today, baseline 1.6). Making good urine. Continue current dose IV Lasix. Further recommendation for nephrology.  Moderate right pleural effusion Secondary to CHF. Repeat chest x-ray in a.m. and decide on thoracentesis.  Elevated troponin associated with acute CHF. No chest pain symptoms or EKG changes.  Uncontrolled type 2 diabetes mellitus with hyperglycemia A1c of 7.4. Patient not following with current PCP due to disagreement regarding her plan of care.  Continue Lantus and pre-meal aspart.  Transaminitis Ultrasound abdomen shows gallbladder sludge and CBD dilated measuring up to 9 mm. No Murphy's sign. LFTs slowly improving.  Discontinue statin.  Hypothyroidism Synthroid was increased for elevated TSH.  Anemia of chronic kidney disease. Colonoscopy in 2015 showed sessile polyps. FOBT negative. CT abdomen and pelvis unremarkable. Iron panel and B12 normal. Received Feraheme and Aranesp.  Hypomagnesemia  Replenished.  Chronic depression Seems to have worsened after her husband death last year. Patient was on Zoloft previously and daughter informs that it did not help. Offered her to start another antidepressant but patient declined.  Constipation Added stool softener  Code Status : Full code  Family Communication  : Daughter at bedside  Disposition: Pending hospital course.  Barriers For Discharge : Active symptoms  Consults  :   Cardiology Nephrology  Procedures  : 2-D echo CT chest Renal ultrasound  DVT Prophylaxis  :  Heparin  Lab Results  Component Value Date   PLT 185 01/07/2017    Antibiotics  :    Anti-infectives    None        Objective:   Vitals:   01/08/17 1208 01/08/17 2100 01/09/17 0618 01/09/17 1208  BP: (!) 110/51 (!) 119/46 (!) 164/64 (!) 104/44  Pulse: 61 66 79 60  Resp:  20 20 18   Temp:  98.2 F (36.8 C) 98 F (36.7 C) 98.3 F (36.8 C)  TempSrc:  Oral Oral Oral  SpO2: 99%  95% 96% 94%  Weight:   99.2 kg (218 lb 12.8 oz)   Height:        Wt Readings from Last 3 Encounters:  01/09/17 99.2 kg (218 lb 12.8 oz)  09/19/16 90.1 kg (198 lb 9.6 oz)  09/06/16 94.3 kg (208 lb)     Intake/Output Summary (Last 24 hours) at 01/09/17 1217 Last data filed at 01/09/17 1034  Gross per 24 hour  Intake              243 ml  Output             2100 ml  Net            -1857 ml     Physical Exam Gen.: Not in distress, flat affect HEENT: Moist mucosa, supple neck Chest: Improved right-sided breath sounds, no added sounds   CVS: Normal S1 and S2, no murmurs GI: Soft, nondistended, nontender Musculoskeletal: 1+ pitting edema bilaterally (R >L)        Data Review:    CBC  Recent Labs Lab 01/04/17 1744 01/05/17 0357 01/06/17 0944 01/07/17 0540  WBC 7.1 7.7 8.4 7.0  HGB 8.3* 7.7* 8.5* 7.9*  HCT 26.2* 24.8* 27.7* 25.7*  PLT 233 204 196 185  MCV 92.3 92.9 95.8 93.1  MCH 29.2 28.8 29.4 28.6  MCHC 31.7 31.0 30.7 30.7  RDW 16.5* 16.6* 17.5* 17.3*  LYMPHSABS  --  1.2  --   --   MONOABS  --  0.3  --   --   EOSABS  --  0.1  --   --   BASOSABS  --  0.0  --   --     Chemistries   Recent Labs Lab 01/04/17 1322 01/05/17 0357 01/06/17 0944 01/07/17 0540 01/08/17 0735 01/09/17 0624  NA 143 143 145 144 143 141  K 4.5 4.6 4.4 4.2 4.3 4.0  CL 112* 112* 114* 112* 111 107  CO2 21* 23 21* 21* 21* 26  GLUCOSE 237* 265* 153* 136* 138* 126*  BUN 71*  68* 67* 67* 69* 72*  CREATININE 3.54* 3.65* 3.84* 3.90* 4.21* 4.29*  CALCIUM 10.1 9.7 9.7 9.6 9.3 9.8  MG  --  1.6*  --   --   --   --   AST 81* 53* 39 25 21  --   ALT 192* 152* 115* 89* 73*  --   ALKPHOS 88 81 73 63 62  --   BILITOT 0.6 0.5 0.6 0.7 0.3  --    ------------------------------------------------------------------------------------------------------------------ No results for input(s): CHOL, HDL, LDLCALC, TRIG, CHOLHDL, LDLDIRECT in the last 72 hours.  Lab Results  Component Value Date    HGBA1C 7.4 (H) 01/04/2017   ------------------------------------------------------------------------------------------------------------------ No results for input(s): TSH, T4TOTAL, T3FREE, THYROIDAB in the last 72 hours.  Invalid input(s): FREET3 ------------------------------------------------------------------------------------------------------------------ No results for input(s): VITAMINB12, FOLATE, FERRITIN, TIBC, IRON, RETICCTPCT in the last 72 hours.  Coagulation profile No results for input(s): INR, PROTIME in the last 168 hours.  No results for input(s): DDIMER in the last 72 hours.  Cardiac Enzymes  Recent Labs Lab 01/04/17 2144 01/05/17 0357 01/05/17 1116  TROPONINI 0.07* 0.06* 0.06*   ------------------------------------------------------------------------------------------------------------------    Component Value Date/Time   BNP 1,317.6 (H) 01/05/2017 0357   BNP 36.6 10/29/2013 1828    Inpatient Medications  Scheduled Meds: . aspirin  81 mg Oral Daily  . carvedilol  3.125 mg Oral BID WC  . cholecalciferol  5,000 Units Oral QPM  . darbepoetin (ARANESP) injection - NON-DIALYSIS  60 mcg Subcutaneous Q Tue-1800  . insulin aspart  0-15 Units Subcutaneous TID WC  . insulin aspart  0-5 Units Subcutaneous QHS  . insulin aspart  3 Units Subcutaneous TID WC  . insulin glargine  8 Units Subcutaneous Daily  . isosorbide-hydrALAZINE  1 tablet Oral TID  . latanoprost  1 drop Both Eyes QPM  . levothyroxine  75 mcg Oral QAC breakfast  . minoxidil  5 mg Oral Daily  . multivitamin with minerals  1 tablet Oral Daily  . omega-3 acid ethyl esters  1 g Oral Daily  . polyethylene glycol  17 g Oral Daily  . sodium chloride flush  10-40 mL Intracatheter Q12H  . sodium chloride flush  3 mL Intravenous Q12H  . timolol  1 drop Both Eyes BID  . vitamin B-12  1,000 mcg Oral QODAY   Continuous Infusions: . sodium chloride    . furosemide Stopped (01/09/17 0854)   PRN  Meds:.sodium chloride, acetaminophen, ondansetron (ZOFRAN) IV, sodium chloride flush, sodium chloride flush  Micro Results No results found for this or any previous visit (from the past 240 hour(s)).  Radiology Reports Ct Abdomen Pelvis Wo Contrast  Result Date: 01/04/2017 CLINICAL DATA:  Dizziness.  Abdominal pain with nausea and vomiting. EXAM: CT CHEST, ABDOMEN AND PELVIS WITHOUT CONTRAST TECHNIQUE: Multidetector CT imaging of the chest, abdomen and pelvis was performed following the standard protocol without IV contrast. COMPARISON:  None. FINDINGS: CT CHEST FINDINGS Cardiovascular: Cardiomegaly with small volume pericardial fluid. Enlarged main pulmonary artery at 4 cm, usually seen with pulmonary hypertension. Diffuse atherosclerotic calcification of the coronaries. Multifocal aortic and great vessel calcification. No acute vascular finding. Mediastinum/Nodes: Hilar enlargement it appears vascular. No adenopathy noted. Negative esophagus. Lungs/Pleura: Moderate layering pleural effusions, greater on the right. Multi segment atelectasis. The aerated lung shows no edema. No pneumothorax. Musculoskeletal: See below CT ABDOMEN PELVIS FINDINGS Hepatobiliary: No focal liver abnormality.Subtle layering calculi. No inflammatory changes in the biliary tree. Normal common bile duct diameter. Pancreas: Unremarkable. Spleen: Unremarkable. Adrenals/Urinary Tract:  Negative adrenals. No hydronephrosis or stone. Hilar calcifications appear atherosclerotic. Unremarkable bladder. Stomach/Bowel: No obstruction. No appendicitis. Extensive sigmoid diverticulosis. Vascular/Lymphatic: Diffuse atherosclerotic calcification. No noted adenopathy. No mass or adenopathy. Reproductive:Fibroid uterus with 4 cm calcified/ hyalinized fibroid. Other: No ascites or pneumoperitoneum.  Anasarca. Musculoskeletal: No acute finding. Severe lumbar facet arthropathy. Status post L4-5 fusion. L5-S1 ankylosis or fusion. Extensive thoracic  spondylosis with multi-level ankylosis. Advanced glenohumeral osteoarthritis. Due to patient size, some of the abdominal wall is not visible. IMPRESSION: 1. Cardiomegaly with volume overload. Moderate right and small left pleural effusions with multi segment atelectasis. Extensive anasarca. 2. Probable pulmonary hypertension. 3. Extensive colonic diverticulosis. 4. Cholelithiasis. Electronically Signed   By: Monte Fantasia M.D.   On: 01/04/2017 20:04   Dg Chest 2 View  Result Date: 01/07/2017 CLINICAL DATA:  Chronic diastolic congestive heart failure. EXAM: CHEST  2 VIEW COMPARISON:  CT chest 01/04/2017 and chest radiograph 01/11/2015. FINDINGS: Trachea is midline. Heart is enlarged. Basilar dependent mixed interstitial and airspace opacification with consolidation in the right lower lobe. Small bilateral pleural effusions. Degenerative changes in shoulders. IMPRESSION: 1. Congestive heart failure. 2. Consolidation in the right lower lobe. Pneumonia cannot be excluded. Electronically Signed   By: Lorin Picket M.D.   On: 01/07/2017 07:38   Ct Head Wo Contrast  Result Date: 01/04/2017 CLINICAL DATA:  Dizziness and weakness. EXAM: CT HEAD WITHOUT CONTRAST TECHNIQUE: Contiguous axial images were obtained from the base of the skull through the vertex without intravenous contrast. COMPARISON:  10/12/2013 head CT, MRI 01/13/2014 FINDINGS: Brain: Chronic moderate superficial atrophy with small vessel ischemic disease of periventricular and subcortical white matter. No acute intracranial hemorrhage, midline shift or edema. No extra-axial fluid collections. The basal cisterns and fourth ventricle are midline. Vascular: Atherosclerotic calcifications of the vertebral arteries and cavernous sinus carotids. No hyperdense vessels. Skull: Negative for fracture or focal lesions. Sinuses/Orbits: No acute finding. Other: None. IMPRESSION: Chronic moderate superficial atrophy with small vessel ischemic disease. No acute  intracranial abnormality. Electronically Signed   By: Ashley Royalty M.D.   On: 01/04/2017 19:47   Ct Chest Wo Contrast  Result Date: 01/04/2017 CLINICAL DATA:  Dizziness.  Abdominal pain with nausea and vomiting. EXAM: CT CHEST, ABDOMEN AND PELVIS WITHOUT CONTRAST TECHNIQUE: Multidetector CT imaging of the chest, abdomen and pelvis was performed following the standard protocol without IV contrast. COMPARISON:  None. FINDINGS: CT CHEST FINDINGS Cardiovascular: Cardiomegaly with small volume pericardial fluid. Enlarged main pulmonary artery at 4 cm, usually seen with pulmonary hypertension. Diffuse atherosclerotic calcification of the coronaries. Multifocal aortic and great vessel calcification. No acute vascular finding. Mediastinum/Nodes: Hilar enlargement it appears vascular. No adenopathy noted. Negative esophagus. Lungs/Pleura: Moderate layering pleural effusions, greater on the right. Multi segment atelectasis. The aerated lung shows no edema. No pneumothorax. Musculoskeletal: See below CT ABDOMEN PELVIS FINDINGS Hepatobiliary: No focal liver abnormality.Subtle layering calculi. No inflammatory changes in the biliary tree. Normal common bile duct diameter. Pancreas: Unremarkable. Spleen: Unremarkable. Adrenals/Urinary Tract: Negative adrenals. No hydronephrosis or stone. Hilar calcifications appear atherosclerotic. Unremarkable bladder. Stomach/Bowel: No obstruction. No appendicitis. Extensive sigmoid diverticulosis. Vascular/Lymphatic: Diffuse atherosclerotic calcification. No noted adenopathy. No mass or adenopathy. Reproductive:Fibroid uterus with 4 cm calcified/ hyalinized fibroid. Other: No ascites or pneumoperitoneum.  Anasarca. Musculoskeletal: No acute finding. Severe lumbar facet arthropathy. Status post L4-5 fusion. L5-S1 ankylosis or fusion. Extensive thoracic spondylosis with multi-level ankylosis. Advanced glenohumeral osteoarthritis. Due to patient size, some of the abdominal wall is not visible.  IMPRESSION: 1. Cardiomegaly with volume overload.  Moderate right and small left pleural effusions with multi segment atelectasis. Extensive anasarca. 2. Probable pulmonary hypertension. 3. Extensive colonic diverticulosis. 4. Cholelithiasis. Electronically Signed   By: Monte Fantasia M.D.   On: 01/04/2017 20:04   US Abdomen Limited Ruq  Result Date: 01/05/2017 CLINICAL DATA:  Abnormal liver functions EXAM: ULTRASOUND ABDOMEN LIMITED RIGHT UPPER QUADRANT COMPARISON:  CT scan October 05, 2014 FINDINGS: Gallbladder: Sludge is seen in the gallbladder with no shadowing to definitively suggest stones. No Murphy's sign or pericholecystic fluid. The gallbladder wall measures up to 3.5 mm. Common bile duct: Diameter: 9 mm Liver: An echogenic mass in the liver measures up to 16 mm. No definitive correlate on the October 05, 2014 CT scan. Incidentally, a right pleural effusion is identified. IMPRESSION: 1. Significant sludge in the gallbladder. No definitive stones are visualized today but suspected small stones were seen on a CT scan in 2016. The gallbladder wall is mildly thickened. No Murphy's sign or pericholecystic fluid identified. 2. The common bile duct is dilated measuring up to 9 mm. Consider an MRCP to evaluate for choledocholithiasis. Electronically Signed   By: Dorise Bullion III M.D   On: 01/05/2017 18:58    Time Spent in minutes 25   Louellen Molder M.D on 01/09/2017 at 12:17 PM  Between 7am to 7pm - Pager - [phone]  After 7pm go to [email or web address] - password [facility]  [facility] -  Office  [phone]

## 2017-01-09 NOTE — Progress Notes (Signed)
Progress Note  Patient Name: Alyssa [name] Date of Encounter: 01/09/2017  Primary Cardiologist: Dr. Claiborne Billings  Subjective   Patient is feeling well; denies chest pain and palpitations. She reports continued shortness of breath, but improved leg swelling.    Inpatient Medications    Scheduled Meds: . aspirin  81 mg Oral Daily  . carvedilol  3.125 mg Oral BID WC  . cholecalciferol  5,000 Units Oral QPM  . darbepoetin (ARANESP) injection - NON-DIALYSIS  60 mcg Subcutaneous Q Tue-1800  . insulin aspart  0-15 Units Subcutaneous TID WC  . insulin aspart  0-5 Units Subcutaneous QHS  . insulin aspart  3 Units Subcutaneous TID WC  . insulin glargine  8 Units Subcutaneous Daily  . isosorbide-hydrALAZINE  1 tablet Oral TID  . latanoprost  1 drop Both Eyes QPM  . levothyroxine  75 mcg Oral QAC breakfast  . minoxidil  5 mg Oral Daily  . multivitamin with minerals  1 tablet Oral Daily  . omega-3 acid ethyl esters  1 g Oral Daily  . polyethylene glycol  17 g Oral Daily  . sodium chloride flush  10-40 mL Intracatheter Q12H  . sodium chloride flush  3 mL Intravenous Q12H  . timolol  1 drop Both Eyes BID  . vitamin B-12  1,000 mcg Oral QODAY   Continuous Infusions: . sodium chloride    . furosemide 120 mg (01/09/17 0754)   PRN Meds: sodium chloride, acetaminophen, ondansetron (ZOFRAN) IV, sodium chloride flush, sodium chloride flush   Vital Signs    Vitals:   01/08/17 0600 01/08/17 1208 01/08/17 2100 01/09/17 0618  BP: (!) 138/52 (!) 110/51 (!) 119/46 (!) 164/64  Pulse: 71 61 66 79  Resp:   20 20  Temp:   98.2 F (36.8 C) 98 F (36.7 C)  TempSrc:   Oral Oral  SpO2:  99% 95% 96%  Weight:    218 lb 12.8 oz (99.2 kg)  Height:        Intake/Output Summary (Last 24 hours) at 01/09/17 0926 Last data filed at 01/08/17 2045  Gross per 24 hour  Intake                0 ml  Output             2200 ml  Net            -2200 ml   Filed Weights   01/07/17 0533 01/08/17 0231 01/09/17  0618  Weight: 239 lb 13.8 oz (108.8 kg) 219 lb 3.2 oz (99.4 kg) 218 lb 12.8 oz (99.2 kg)     Physical Exam   General: Well developed, well nourished, female appearing in no acute distress. Head: Normocephalic, atraumatic.  Neck: Supple without bruits, no JVD. Lungs:  Resp regular and unlabored, CTA, diminished in right base Heart: RRR, S1, S2, no S3, S4, or murmur; no rub. Abdomen: Soft, non-tender, non-distended with normoactive bowel sounds. No hepatomegaly. No rebound/guarding. No obvious abdominal masses. Extremities: No clubbing, cyanosis, Trace edema. Distal pedal pulses are 1+ bilaterally. Neuro: Alert and oriented X 3. Moves all extremities spontaneously. Psych: Normal affect.  Labs    Chemistry Recent Labs Lab 01/06/17 0944 01/07/17 0540 01/08/17 0735 01/09/17 0624  NA 145 144 143 141  K 4.4 4.2 4.3 4.0  CL 114* 112* 111 107  CO2 21* 21* 21* 26  GLUCOSE 153* 136* 138* 126*  BUN 67* 67* 69* 72*  CREATININE 3.84* 3.90* 4.21* 4.29*  CALCIUM 9.7  9.6 9.3 9.8  PROT 6.0* 5.5* 5.5*  --   ALBUMIN 3.3* 3.3* 3.1*  --   AST 39 25 21  --   ALT 115* 89* 73*  --   ALKPHOS 73 63 62  --   BILITOT 0.6 0.7 0.3  --   GFRNONAA 11* 11* 10* 9*  GFRAA 13* 12* 11* 11*  ANIONGAP 10 11 11 8      Hematology Recent Labs Lab 01/05/17 0357 01/06/17 0944 01/07/17 0540  WBC 7.7 8.4 7.0  RBC 2.67* 2.89* 2.76*  HGB 7.7* 8.5* 7.9*  HCT 24.8* 27.7* 25.7*  MCV 92.9 95.8 93.1  MCH 28.8 29.4 28.6  MCHC 31.0 30.7 30.7  RDW 16.6* 17.5* 17.3*  PLT 204 196 185    Cardiac Enzymes Recent Labs Lab 01/04/17 1744 01/04/17 2144 01/05/17 0357 01/05/17 1116  TROPONINI 0.06* 0.07* 0.06* 0.06*   No results for input(s): TROPIPOC in the last 168 hours.   BNP Recent Labs Lab 01/04/17 1744 01/05/17 0357  BNP 899.1* 1,317.6*     DDimer No results for input(s): DDIMER in the last 168 hours.   Radiology    No results found.   Telemetry    Sinus rhythm, 1 episode of SVT -  Personally Reviewed  ECG    No new tracings - Personally Reviewed   Cardiac Studies   Echocardiogram 01/05/17: Study Conclusions - Left ventricle: The cavity size was normal. Wall thickness was   increased in a pattern of severe LVH. Systolic function was   normal. The estimated ejection fraction was in the range of 50%   to 55%. Wall motion was normal; there were no regional wall   motion abnormalities. Doppler parameters are consistent with   abnormal left ventricular relaxation (grade 1 diastolic   dysfunction). Doppler parameters are consistent with high   ventricular filling pressure. - Aortic valve: Mildly calcified annulus. Trileaflet; normal   thickness leaflets. Valve area (VTI): 3.61 cm^2. Valve area   (Vmax): 3.31 cm^2. Valve area (Vmean): 3.39 cm^2. - Mitral valve: Mildly calcified annulus. Normal thickness leaflets   . Valve area by continuity equation (using LVOT flow): 2.66 cm^2. - Left atrium: The atrium was mildly to moderately dilated. - Right atrium: The atrium was mildly dilated. - Atrial septum: No defect or patent foramen ovale was identified. - Pericardium, extracardiac: There is a large left pleural   effusion. There is a trivial circumferential pericardial   effusion. There is no evidence of tamponade physiology.    Patient Profile     71 y.o. female admitted with acute on chronic diastolic congestive heart failure and acute on chronic kidney disease. Echocardiogram shows normal LV systolic function, severe left ventricular hypertrophy, grade 1 diastolic dysfunction and biatrial enlargement.  Assessment & Plan    1. Acute on chronic diastolic congestive heart failure - She is overall net -4.6 L with 1.8 L urine output yesterday - Weight is 218 pounds, down from 222 pounds on admission - LE edema is improved    2. 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 elevated troponin -  0.06 --> 0.07 --> 0.06 --> 0.06 --> 0.06  - not consistent with ACS, no further ischemic evaluation at this time    4. Hypertension  - continue Coreg and Bidil  - pressures have been controlled   5. Right pleural  effusion.  - No intervention at this time  - c/o SOB and requiring supplemental O2   Signed, Ledora Bottcher , PA-C 9:26 AM 01/09/2017 Pager: [phone] Still with dyspnea (improved since admission); no chest pain; I/O-[id number]; continue present dose of lasix and follow renal function; nephrology following; would repeat PA and lateral chest xray in AM; may need repeat thoracentesis.  Kirk Ruths, MD

## 2017-01-09 NOTE — Progress Notes (Signed)
[facility] Progress Note    Assessment/ Plan:   1  Acute / chronic renal failure: I suspect cardiorenal syndrome as primary driver.  Also had pyruria on UA--> PPI d/c'd in case AIN playing a role.  Lasix 120 IV BID; will plan to switch to PO tomorrow. Is followed by Dr. Joelyn Oms in clinic. 2  Acute/ chron diast CHF / vol overload: cardiology following.    3  HTN: cont home minoxidil; coreg and Bidil added here. BP's are good.  Do not recommend restarting ACEi/ARB 4  Gout- no active flare 5  Anemia: will give single dose of feraheme today and Aranesp 60 mcg  6.  H/o R pleural effusion: repeat CXR for tomorrow per cardiology  Subjective:    Much improved LE edema.     Objective:   BP (!) 104/44 (BP Location: Left Arm)   Pulse 60   Temp 98.3 F (36.8 C) (Oral)   Resp 18   Ht 5\' 3"  (1.6 m)   Wt 99.2 kg (218 lb 12.8 oz)   SpO2 94%   BMI 38.76 kg/m   Intake/Output Summary (Last 24 hours) at 01/09/17 1522 Last data filed at 01/09/17 1357  Gross per 24 hour  Intake              603 ml  Output             2350 ml  Net            -1747 ml   Weight change: -0.181 kg (-6.4 oz)  Physical Exam: Gen elderly female sitting in bed, eating breakfast, NAD No jvd or bruits Chest: R sided diminished BS, crackles improved RRR question syst M, no RG Abd soft ntnd no mass or ascites +bs obese MS no joint effusions or deformity Ext trace LE edema / no wounds or ulcers Neuro is alert, Ox 3 , nf Imaging: No results found.  Labs: BMET  Recent Labs Lab 01/04/17 1322 01/05/17 0357 01/06/17 0944 01/07/17 0540 01/08/17 0735 01/09/17 0624  NA 143 143 145 144 143 141  K 4.5 4.6 4.4 4.2 4.3 4.0  CL 112* 112* 114* 112* 111 107  CO2 21* 23 21* 21* 21* 26  GLUCOSE 237* 265* 153* 136* 138* 126*  BUN 71* 68* 67* 67* 69* 72*  CREATININE 3.54* 3.65* 3.84* 3.90* 4.21* 4.29*  CALCIUM 10.1 9.7 9.7 9.6 9.3 9.8   CBC  Recent Labs Lab 01/04/17 1744 01/05/17 0357  01/06/17 0944 01/07/17 0540  WBC 7.1 7.7 8.4 7.0  NEUTROABS  --  6.0  --   --   HGB 8.3* 7.7* 8.5* 7.9*  HCT 26.2* 24.8* 27.7* 25.7*  MCV 92.3 92.9 95.8 93.1  PLT 233 204 196 185    Medications:    . aspirin  81 mg Oral Daily  . carvedilol  3.125 mg Oral BID WC  . cholecalciferol  5,000 Units Oral QPM  . darbepoetin (ARANESP) injection - NON-DIALYSIS  60 mcg Subcutaneous Q Tue-1800  . insulin aspart  0-15 Units Subcutaneous TID WC  . insulin aspart  0-5 Units Subcutaneous QHS  . insulin aspart  3 Units Subcutaneous TID WC  . insulin glargine  8 Units Subcutaneous Daily  . isosorbide-hydrALAZINE  1 tablet Oral TID  . latanoprost  1 drop Both Eyes QPM  . levothyroxine  75 mcg Oral QAC breakfast  . minoxidil  5 mg Oral Daily  . multivitamin with minerals  1 tablet Oral Daily  .  omega-3 acid ethyl esters  1 g Oral Daily  . polyethylene glycol  17 g Oral Daily  . sodium chloride flush  10-40 mL Intracatheter Q12H  . sodium chloride flush  3 mL Intravenous Q12H  . timolol  1 drop Both Eyes BID  . vitamin B-12  1,000 mcg Oral QODAY      Madelon Lips, MD 01/09/2017, 3:22 PM

## 2017-01-10 ENCOUNTER — Encounter: Payer: Self-pay | Admitting: Physician Assistant

## 2017-01-10 ENCOUNTER — Inpatient Hospital Stay (HOSPITAL_COMMUNITY): Payer: Medicare Other

## 2017-01-10 DIAGNOSIS — I5033 Acute on chronic diastolic (congestive) heart failure: Secondary | ICD-10-CM

## 2017-01-10 HISTORY — PX: IR THORACENTESIS ASP PLEURAL SPACE W/IMG GUIDE: IMG5380

## 2017-01-10 LAB — BASIC METABOLIC PANEL
Anion gap: 9 (ref 5–15)
BUN: 73 mg/dL — AB (ref 6–20)
CALCIUM: 9.5 mg/dL (ref 8.9–10.3)
CHLORIDE: 105 mmol/L (ref 101–111)
CO2: 26 mmol/L (ref 22–32)
CREATININE: 4.37 mg/dL — AB (ref 0.44–1.00)
GFR calc non Af Amer: 9 mL/min — ABNORMAL LOW (ref >60)
GFR, EST AFRICAN AMERICAN: 11 mL/min — AB (ref >60)
Glucose, Bld: 129 mg/dL — ABNORMAL HIGH (ref 65–99)
Potassium: 3.8 mmol/L (ref 3.5–5.1)
Sodium: 140 mmol/L (ref 135–145)

## 2017-01-10 LAB — GLUCOSE, CAPILLARY
GLUCOSE-CAPILLARY: 134 mg/dL — AB (ref 65–99)
GLUCOSE-CAPILLARY: 256 mg/dL — AB (ref 65–99)
Glucose-Capillary: 123 mg/dL — ABNORMAL HIGH (ref 65–99)
Glucose-Capillary: 164 mg/dL — ABNORMAL HIGH (ref 65–99)

## 2017-01-10 MED ORDER — LIDOCAINE HCL (PF) 1 % IJ SOLN
INTRAMUSCULAR | Status: AC
  Filled 2017-01-10: qty 30

## 2017-01-10 MED ORDER — LIDOCAINE HCL 1 % IJ SOLN
INTRAMUSCULAR | Status: DC | PRN
Start: 2017-01-10 — End: 2017-01-12
  Administered 2017-01-10: 10 mL

## 2017-01-10 NOTE — Progress Notes (Signed)
[facility] Progress Note    Assessment/ Plan:   1  Acute / chronic renal failure: I suspect cardiorenal syndrome as primary driver.  Also had pyruria on UA--> PPI d/c'd in case AIN playing a role. PO Lasix 120 mg BID. Is followed by Dr. Joelyn Oms in clinic.  Cr still inching upwards, I hope it will stabilize with switching to PO Lasix. 2  Acute/ chron diast CHF / vol overload: cardiology following.    3  HTN: cont home minoxidil; coreg and Bidil added here. BP's are good.  Do not recommend restarting ACEi/ARB 4  Gout- no active flare 5  Anemia: will give single dose of feraheme today and Aranesp 60 mcg  6.  H/o R pleural effusion: Repeat CXR demonstrates again R>L pleural effusions.  Thoracentesis being considered.  Subjective:    No acute events.     Objective:   BP (!) 119/45 (BP Location: Left Arm)   Pulse 60   Temp 98.5 F (36.9 C) (Oral)   Resp 20   Ht 5\' 3"  (1.6 m)   Wt 97.8 kg (215 lb 9.6 oz) Comment: c scale  SpO2 95%   BMI 38.19 kg/m   Intake/Output Summary (Last 24 hours) at 01/10/17 1401 Last data filed at 01/10/17 1308  Gross per 24 hour  Intake             1385 ml  Output             3150 ml  Net            -1765 ml   Weight change: -1.452 kg (-3 lb 3.2 oz)  Physical Exam: Gen elderly female sitting in bed, eating lunch, NAD No jvd or bruits Chest: R sided diminished BS, crackles improved RRR question syst M, no RG Abd soft ntnd no mass or ascites +bs obese MS no joint effusions or deformity Ext trace LE edema, improved Neuro is alert, Ox 3 , nf Imaging: Dg Chest 2 View  Result Date: 01/10/2017 CLINICAL DATA:  CHF, right lower lobe atelectasis or pneumonia, possible pleural effusion. EXAM: CHEST  2 VIEW COMPARISON:  PA and lateral chest x-ray of January 07, 2017 and chest CT scan of January 04, 2017. FINDINGS: The cardiac silhouette remains enlarged. There small bilateral pleural effusions greatest on the right. Density at the right lung base  persists. The pulmonary vascularity is mildly engorged. IMPRESSION: CHF with right basilar atelectasis or pneumonia. Bilateral pleural effusions which have increased slightly in volume since the previous study. Electronically Signed   By: David  Martinique M.D.   On: 01/10/2017 09:56    Labs: BMET  Recent Labs Lab 01/04/17 1322 01/05/17 0357 01/06/17 0944 01/07/17 0540 01/08/17 0735 01/09/17 0624 01/10/17 0329  NA 143 143 145 144 143 141 140  K 4.5 4.6 4.4 4.2 4.3 4.0 3.8  CL 112* 112* 114* 112* 111 107 105  CO2 21* 23 21* 21* 21* 26 26  GLUCOSE 237* 265* 153* 136* 138* 126* 129*  BUN 71* 68* 67* 67* 69* 72* 73*  CREATININE 3.54* 3.65* 3.84* 3.90* 4.21* 4.29* 4.37*  CALCIUM 10.1 9.7 9.7 9.6 9.3 9.8 9.5   CBC  Recent Labs Lab 01/04/17 1744 01/05/17 0357 01/06/17 0944 01/07/17 0540  WBC 7.1 7.7 8.4 7.0  NEUTROABS  --  6.0  --   --   HGB 8.3* 7.7* 8.5* 7.9*  HCT 26.2* 24.8* 27.7* 25.7*  MCV 92.3 92.9 95.8 93.1  PLT 233 204 196 185  Medications:    . aspirin  81 mg Oral Daily  . carvedilol  3.125 mg Oral BID WC  . cholecalciferol  5,000 Units Oral QPM  . darbepoetin (ARANESP) injection - NON-DIALYSIS  60 mcg Subcutaneous Q Tue-1800  . furosemide  120 mg Oral BID  . insulin aspart  0-15 Units Subcutaneous TID WC  . insulin aspart  0-5 Units Subcutaneous QHS  . insulin aspart  3 Units Subcutaneous TID WC  . insulin glargine  8 Units Subcutaneous Daily  . isosorbide-hydrALAZINE  1 tablet Oral TID  . latanoprost  1 drop Both Eyes QPM  . levothyroxine  75 mcg Oral QAC breakfast  . minoxidil  5 mg Oral Daily  . multivitamin with minerals  1 tablet Oral Daily  . omega-3 acid ethyl esters  1 g Oral Daily  . polyethylene glycol  17 g Oral Daily  . sodium chloride flush  10-40 mL Intracatheter Q12H  . sodium chloride flush  3 mL Intravenous Q12H  . timolol  1 drop Both Eyes BID  . vitamin B-12  1,000 mcg Oral QODAY      Madelon Lips, MD 01/10/2017, 2:01 PM

## 2017-01-10 NOTE — Progress Notes (Signed)
PROGRESS NOTE                                                                                                                                                                                                             Patient Demographics:    Alyssa [name], is a 71 y.o. female, DOB - September 05, 1945, WIO:[id number]  Admit date - 01/04/2017   Admitting Physician Benito Mccreedy, MD  Outpatient Primary MD for the patient is Patient, No Pcp Per  LOS - 4  Outpatient Specialists: none  Chief Complaint  Patient presents with  . Abdominal Pain       Brief Narrative   71 year old female with history of type 2 diabetes mellitus, hypertension, hyperlipidemia presented feeling poorly for several days. Reported easy fatigability and shortness of breath on exertion. Denied fever, chills or travel. Denies recent illness. Patient has not been seeing a PCP last few months after having a disagreement regarding her care. Patient found to have acute on chronic CHF with right-sided moderate pleural effusion, acute on chronic kidney disease stage III and anemia. Cardiology and nephrology following.   Subjective:   Denies worsening shortness of breath. Had bowel movement yesterday. Had good diuresis with almost 4.1 L urine output.   Assessment  & Plan :   Principal problem Acute on chronic systolic CHF. 2-D echo with EF of 50-55%.  -Transitioned to oral Lasix 120 mg twice a day. Good urine output (4.1 L with with down 3 lbs further).  Continue aspirin and Coreg. Added BiDil. ARB discontinued due to worsened renal function.    Acute on chronic kidney disease stage III - Suspecting cardiorenal syndrome, diabetic nephropathy and medication nonadherence also likely contributing.  ? AIN secondary to PPI which is now discontinued. Creatinine continues to rise (4.37 today, baseline 1.6). Patient however making good urine. Continue current dose  of oral Lasix. Hopefully renal function will plateau and improve.  Further recommendation for nephrology.  Moderate right pleural effusion Secondary to CHF. Repeat Chest x-ray this morning shows increasing bilateral pleural effusion. Ordered right thoracentesis.  Elevated troponin associated with acute CHF. No chest pain symptoms or EKG changes.  Uncontrolled type 2 diabetes mellitus with hyperglycemia A1c of 7.4. Patient not following with current PCP due to disagreement regarding her plan of care.  Continue Lantus and pre-meal aspart.   Transaminitis Ultrasound  abdomen shows gallbladder sludge and CBD dilated measuring up to 9 mm. No Murphy's sign. LFTs slowly improving.  Discontinue statin.  Hypothyroidism Synthroid dose increased for elevated TSH.  Anemia of chronic kidney disease. Colonoscopy in 2015 showed sessile polyps. FOBT negative. CT abdomen and pelvis unremarkable. Iron panel and B12 normal. Received Feraheme and Aranesp.  Hypomagnesemia  Replenished.  Chronic depression Seems to have worsened after her husband death last year. Patient was on Zoloft previously and daughter informs that it did not help. Offered her to start another antidepressant but patient declined.     Code Status : Full code  Family Communication  : Daughter at bedside  Disposition: Pending hospital course.  Barriers For Discharge : Active symptoms  Consults  :   Cardiology Nephrology  Procedures  : 2-D echo CT chest Renal ultrasound  DVT Prophylaxis  :  Heparin  Lab Results  Component Value Date   PLT 185 01/07/2017    Antibiotics  :    Anti-infectives    None        Objective:   Vitals:   01/09/17 1208 01/09/17 2100 01/10/17 0535 01/10/17 1202  BP: (!) 104/44 (!) 139/57 (!) 141/61 (!) 119/45  Pulse: 60 69 60 60  Resp: 18 18 20 20   Temp: 98.3 F (36.8 C) 98.1 F (36.7 C) 98.9 F (37.2 C) 98.5 F (36.9 C)  TempSrc: Oral Oral Oral Oral  SpO2: 94% 97% 95% 95%    Weight:   97.8 kg (215 lb 9.6 oz)   Height:        Wt Readings from Last 3 Encounters:  01/10/17 97.8 kg (215 lb 9.6 oz)  09/19/16 90.1 kg (198 lb 9.6 oz)  09/06/16 94.3 kg (208 lb)     Intake/Output Summary (Last 24 hours) at 01/10/17 1247 Last data filed at 01/10/17 1051  Gross per 24 hour  Intake             1385 ml  Output             4000 ml  Net            -2615 ml     Physical Exam Gen.: Elderly female not in distress, continues to have flat affect HEENT: Moist mucosa, supple neck Chest: Improved right-sided breath sounds with fine right basilar rales CVS: Normal S1 and S2, no murmurs GI: Soft, nondistended, nontender Musculoskeletal: 1+ pitting edema bilaterally (R >L)        Data Review:    CBC  Recent Labs Lab 01/04/17 1744 01/05/17 0357 01/06/17 0944 01/07/17 0540  WBC 7.1 7.7 8.4 7.0  HGB 8.3* 7.7* 8.5* 7.9*  HCT 26.2* 24.8* 27.7* 25.7*  PLT 233 204 196 185  MCV 92.3 92.9 95.8 93.1  MCH 29.2 28.8 29.4 28.6  MCHC 31.7 31.0 30.7 30.7  RDW 16.5* 16.6* 17.5* 17.3*  LYMPHSABS  --  1.2  --   --   MONOABS  --  0.3  --   --   EOSABS  --  0.1  --   --   BASOSABS  --  0.0  --   --     Chemistries   Recent Labs Lab 01/04/17 1322 01/05/17 0357 01/06/17 0944 01/07/17 0540 01/08/17 0735 01/09/17 0624 01/10/17 0329  NA 143 143 145 144 143 141 140  K 4.5 4.6 4.4 4.2 4.3 4.0 3.8  CL 112* 112* 114* 112* 111 107 105  CO2 21* 23 21* 21* 21* 26 26  GLUCOSE 237* 265* 153* 136* 138* 126* 129*  BUN 71* 68* 67* 67* 69* 72* 73*  CREATININE 3.54* 3.65* 3.84* 3.90* 4.21* 4.29* 4.37*  CALCIUM 10.1 9.7 9.7 9.6 9.3 9.8 9.5  MG  --  1.6*  --   --   --   --   --   AST 81* 53* 39 25 21  --   --   ALT 192* 152* 115* 89* 73*  --   --   ALKPHOS 88 81 73 63 62  --   --   BILITOT 0.6 0.5 0.6 0.7 0.3  --   --    ------------------------------------------------------------------------------------------------------------------ No results for input(s): CHOL,  HDL, LDLCALC, TRIG, CHOLHDL, LDLDIRECT in the last 72 hours.  Lab Results  Component Value Date   HGBA1C 7.4 (H) 01/04/2017   ------------------------------------------------------------------------------------------------------------------ No results for input(s): TSH, T4TOTAL, T3FREE, THYROIDAB in the last 72 hours.  Invalid input(s): FREET3 ------------------------------------------------------------------------------------------------------------------ No results for input(s): VITAMINB12, FOLATE, FERRITIN, TIBC, IRON, RETICCTPCT in the last 72 hours.  Coagulation profile No results for input(s): INR, PROTIME in the last 168 hours.  No results for input(s): DDIMER in the last 72 hours.  Cardiac Enzymes  Recent Labs Lab 01/04/17 2144 01/05/17 0357 01/05/17 1116  TROPONINI 0.07* 0.06* 0.06*   ------------------------------------------------------------------------------------------------------------------    Component Value Date/Time   BNP 1,317.6 (H) 01/05/2017 0357   BNP 36.6 10/29/2013 1828    Inpatient Medications  Scheduled Meds: . aspirin  81 mg Oral Daily  . carvedilol  3.125 mg Oral BID WC  . cholecalciferol  5,000 Units Oral QPM  . darbepoetin (ARANESP) injection - NON-DIALYSIS  60 mcg Subcutaneous Q Tue-1800  . furosemide  120 mg Oral BID  . insulin aspart  0-15 Units Subcutaneous TID WC  . insulin aspart  0-5 Units Subcutaneous QHS  . insulin aspart  3 Units Subcutaneous TID WC  . insulin glargine  8 Units Subcutaneous Daily  . isosorbide-hydrALAZINE  1 tablet Oral TID  . latanoprost  1 drop Both Eyes QPM  . levothyroxine  75 mcg Oral QAC breakfast  . minoxidil  5 mg Oral Daily  . multivitamin with minerals  1 tablet Oral Daily  . omega-3 acid ethyl esters  1 g Oral Daily  . polyethylene glycol  17 g Oral Daily  . sodium chloride flush  10-40 mL Intracatheter Q12H  . sodium chloride flush  3 mL Intravenous Q12H  . timolol  1 drop Both Eyes BID  .  vitamin B-12  1,000 mcg Oral QODAY   Continuous Infusions: . sodium chloride     PRN Meds:.sodium chloride, acetaminophen, ondansetron (ZOFRAN) IV, sodium chloride flush, sodium chloride flush  Micro Results No results found for this or any previous visit (from the past 240 hour(s)).  Radiology Reports Ct Abdomen Pelvis Wo Contrast  Result Date: 01/04/2017 CLINICAL DATA:  Dizziness.  Abdominal pain with nausea and vomiting. EXAM: CT CHEST, ABDOMEN AND PELVIS WITHOUT CONTRAST TECHNIQUE: Multidetector CT imaging of the chest, abdomen and pelvis was performed following the standard protocol without IV contrast. COMPARISON:  None. FINDINGS: CT CHEST FINDINGS Cardiovascular: Cardiomegaly with small volume pericardial fluid. Enlarged main pulmonary artery at 4 cm, usually seen with pulmonary hypertension. Diffuse atherosclerotic calcification of the coronaries. Multifocal aortic and great vessel calcification. No acute vascular finding. Mediastinum/Nodes: Hilar enlargement it appears vascular. No adenopathy noted. Negative esophagus. Lungs/Pleura: Moderate layering pleural effusions, greater on the right. Multi segment atelectasis. The aerated lung shows no edema. No pneumothorax. Musculoskeletal:  See below CT ABDOMEN PELVIS FINDINGS Hepatobiliary: No focal liver abnormality.Subtle layering calculi. No inflammatory changes in the biliary tree. Normal common bile duct diameter. Pancreas: Unremarkable. Spleen: Unremarkable. Adrenals/Urinary Tract: Negative adrenals. No hydronephrosis or stone. Hilar calcifications appear atherosclerotic. Unremarkable bladder. Stomach/Bowel: No obstruction. No appendicitis. Extensive sigmoid diverticulosis. Vascular/Lymphatic: Diffuse atherosclerotic calcification. No noted adenopathy. No mass or adenopathy. Reproductive:Fibroid uterus with 4 cm calcified/ hyalinized fibroid. Other: No ascites or pneumoperitoneum.  Anasarca. Musculoskeletal: No acute finding. Severe lumbar facet  arthropathy. Status post L4-5 fusion. L5-S1 ankylosis or fusion. Extensive thoracic spondylosis with multi-level ankylosis. Advanced glenohumeral osteoarthritis. Due to patient size, some of the abdominal wall is not visible. IMPRESSION: 1. Cardiomegaly with volume overload. Moderate right and small left pleural effusions with multi segment atelectasis. Extensive anasarca. 2. Probable pulmonary hypertension. 3. Extensive colonic diverticulosis. 4. Cholelithiasis. Electronically Signed   By: Monte Fantasia M.D.   On: 01/04/2017 20:04   Dg Chest 2 View  Result Date: 01/10/2017 CLINICAL DATA:  CHF, right lower lobe atelectasis or pneumonia, possible pleural effusion. EXAM: CHEST  2 VIEW COMPARISON:  PA and lateral chest x-ray of January 07, 2017 and chest CT scan of January 04, 2017. FINDINGS: The cardiac silhouette remains enlarged. There small bilateral pleural effusions greatest on the right. Density at the right lung base persists. The pulmonary vascularity is mildly engorged. IMPRESSION: CHF with right basilar atelectasis or pneumonia. Bilateral pleural effusions which have increased slightly in volume since the previous study. Electronically Signed   By: David  Martinique M.D.   On: 01/10/2017 09:56   Dg Chest 2 View  Result Date: 01/07/2017 CLINICAL DATA:  Chronic diastolic congestive heart failure. EXAM: CHEST  2 VIEW COMPARISON:  CT chest 01/04/2017 and chest radiograph 01/11/2015. FINDINGS: Trachea is midline. Heart is enlarged. Basilar dependent mixed interstitial and airspace opacification with consolidation in the right lower lobe. Small bilateral pleural effusions. Degenerative changes in shoulders. IMPRESSION: 1. Congestive heart failure. 2. Consolidation in the right lower lobe. Pneumonia cannot be excluded. Electronically Signed   By: Lorin Picket M.D.   On: 01/07/2017 07:38   Ct Head Wo Contrast  Result Date: 01/04/2017 CLINICAL DATA:  Dizziness and weakness. EXAM: CT HEAD WITHOUT CONTRAST  TECHNIQUE: Contiguous axial images were obtained from the base of the skull through the vertex without intravenous contrast. COMPARISON:  10/12/2013 head CT, MRI 01/13/2014 FINDINGS: Brain: Chronic moderate superficial atrophy with small vessel ischemic disease of periventricular and subcortical white matter. No acute intracranial hemorrhage, midline shift or edema. No extra-axial fluid collections. The basal cisterns and fourth ventricle are midline. Vascular: Atherosclerotic calcifications of the vertebral arteries and cavernous sinus carotids. No hyperdense vessels. Skull: Negative for fracture or focal lesions. Sinuses/Orbits: No acute finding. Other: None. IMPRESSION: Chronic moderate superficial atrophy with small vessel ischemic disease. No acute intracranial abnormality. Electronically Signed   By: Ashley Royalty M.D.   On: 01/04/2017 19:47   Ct Chest Wo Contrast  Result Date: 01/04/2017 CLINICAL DATA:  Dizziness.  Abdominal pain with nausea and vomiting. EXAM: CT CHEST, ABDOMEN AND PELVIS WITHOUT CONTRAST TECHNIQUE: Multidetector CT imaging of the chest, abdomen and pelvis was performed following the standard protocol without IV contrast. COMPARISON:  None. FINDINGS: CT CHEST FINDINGS Cardiovascular: Cardiomegaly with small volume pericardial fluid. Enlarged main pulmonary artery at 4 cm, usually seen with pulmonary hypertension. Diffuse atherosclerotic calcification of the coronaries. Multifocal aortic and great vessel calcification. No acute vascular finding. Mediastinum/Nodes: Hilar enlargement it appears vascular. No adenopathy noted. Negative esophagus. Lungs/Pleura:  Moderate layering pleural effusions, greater on the right. Multi segment atelectasis. The aerated lung shows no edema. No pneumothorax. Musculoskeletal: See below CT ABDOMEN PELVIS FINDINGS Hepatobiliary: No focal liver abnormality.Subtle layering calculi. No inflammatory changes in the biliary tree. Normal common bile duct diameter.  Pancreas: Unremarkable. Spleen: Unremarkable. Adrenals/Urinary Tract: Negative adrenals. No hydronephrosis or stone. Hilar calcifications appear atherosclerotic. Unremarkable bladder. Stomach/Bowel: No obstruction. No appendicitis. Extensive sigmoid diverticulosis. Vascular/Lymphatic: Diffuse atherosclerotic calcification. No noted adenopathy. No mass or adenopathy. Reproductive:Fibroid uterus with 4 cm calcified/ hyalinized fibroid. Other: No ascites or pneumoperitoneum.  Anasarca. Musculoskeletal: No acute finding. Severe lumbar facet arthropathy. Status post L4-5 fusion. L5-S1 ankylosis or fusion. Extensive thoracic spondylosis with multi-level ankylosis. Advanced glenohumeral osteoarthritis. Due to patient size, some of the abdominal wall is not visible. IMPRESSION: 1. Cardiomegaly with volume overload. Moderate right and small left pleural effusions with multi segment atelectasis. Extensive anasarca. 2. Probable pulmonary hypertension. 3. Extensive colonic diverticulosis. 4. Cholelithiasis. Electronically Signed   By: Monte Fantasia M.D.   On: 01/04/2017 20:04   US Abdomen Limited Ruq  Result Date: 01/05/2017 CLINICAL DATA:  Abnormal liver functions EXAM: ULTRASOUND ABDOMEN LIMITED RIGHT UPPER QUADRANT COMPARISON:  CT scan October 05, 2014 FINDINGS: Gallbladder: Sludge is seen in the gallbladder with no shadowing to definitively suggest stones. No Murphy's sign or pericholecystic fluid. The gallbladder wall measures up to 3.5 mm. Common bile duct: Diameter: 9 mm Liver: An echogenic mass in the liver measures up to 16 mm. No definitive correlate on the October 05, 2014 CT scan. Incidentally, a right pleural effusion is identified. IMPRESSION: 1. Significant sludge in the gallbladder. No definitive stones are visualized today but suspected small stones were seen on a CT scan in 2016. The gallbladder wall is mildly thickened. No Murphy's sign or pericholecystic fluid identified. 2. The common bile duct is dilated  measuring up to 9 mm. Consider an MRCP to evaluate for choledocholithiasis. Electronically Signed   By: Dorise Bullion III M.D   On: 01/05/2017 18:58    Time Spent in minutes 25   Louellen Molder M.D on 01/10/2017 at 12:47 PM  Between 7am to 7pm - Pager - [phone]  After 7pm go to [email or web address] - password [facility]  [facility] -  Office  [phone]

## 2017-01-10 NOTE — Progress Notes (Signed)
Inpatient Diabetes Program Recommendations  AACE/ADA: New Consensus Statement on Inpatient Glycemic Control (2015)  Target Ranges:  Prepandial:   less than 140 mg/dL      Peak postprandial:   less than 180 mg/dL (1-2 hours)      Critically ill patients:  140 - 180 mg/dL   Results for MIALEE, WEYMAN (MRN [id number]) as of 01/10/2017 11:47  Ref. Range 01/09/2017 07:27 01/09/2017 11:20 01/09/2017 16:37 01/09/2017 21:03 01/10/2017 07:44  Glucose-Capillary Latest Ref Range: 65 - 99 mg/dL 119 (H) 223 (H) 227 (H) 231 (H) 123 (H)  Results for DAYNA, ALIA (MRN [id number]) as of 01/10/2017 11:47  Ref. Range 01/08/2017 07:42 01/08/2017 12:06 01/08/2017 16:25 01/08/2017 21:02  Glucose-Capillary Latest Ref Range: 65 - 99 mg/dL 129 (H) 293 (H) 202 (H) 153 (H)   Review of Glycemic Control  Diabetes history: DM2 Outpatient Diabetes medications: Glipizide 10 mg as needed  Current orders for Inpatient glycemic control: Lantus 8 units daily, Novolog 0-15 units TID with meals, Novolog 0-5 units QHS, Novolog 3 units TID with meals  Inpatient Diabetes Program Recommendations: Insulin - Meal Coverage: Post prandial glucose is consistently elevated. Please consider increasing meal coverage to Novolog 5 units TID with meals if patient eats at least 50% of meals.  Thanks, Barnie Alderman, RN, MSN, CDE Diabetes Coordinator Inpatient Diabetes Program [phone] (Team Pager from 8am to 5pm)

## 2017-01-10 NOTE — Progress Notes (Signed)
Progress Note  Patient Name: Alyssa [name] Date of Encounter: 01/10/2017  Primary Cardiologist: Dr. Claiborne Billings  Subjective   No chest pain; remains dyspneic with ambulation   Inpatient Medications    Scheduled Meds: . aspirin  81 mg Oral Daily  . carvedilol  3.125 mg Oral BID WC  . cholecalciferol  5,000 Units Oral QPM  . darbepoetin (ARANESP) injection - NON-DIALYSIS  60 mcg Subcutaneous Q Tue-1800  . furosemide  120 mg Oral BID  . insulin aspart  0-15 Units Subcutaneous TID WC  . insulin aspart  0-5 Units Subcutaneous QHS  . insulin aspart  3 Units Subcutaneous TID WC  . insulin glargine  8 Units Subcutaneous Daily  . isosorbide-hydrALAZINE  1 tablet Oral TID  . latanoprost  1 drop Both Eyes QPM  . levothyroxine  75 mcg Oral QAC breakfast  . minoxidil  5 mg Oral Daily  . multivitamin with minerals  1 tablet Oral Daily  . omega-3 acid ethyl esters  1 g Oral Daily  . polyethylene glycol  17 g Oral Daily  . sodium chloride flush  10-40 mL Intracatheter Q12H  . sodium chloride flush  3 mL Intravenous Q12H  . timolol  1 drop Both Eyes BID  . vitamin B-12  1,000 mcg Oral QODAY   Continuous Infusions: . sodium chloride     PRN Meds: sodium chloride, acetaminophen, ondansetron (ZOFRAN) IV, sodium chloride flush, sodium chloride flush   Vital Signs    Vitals:   01/09/17 0618 01/09/17 1208 01/09/17 2100 01/10/17 0535  BP: (!) 164/64 (!) 104/44 (!) 139/57 (!) 141/61  Pulse: 79 60 69 60  Resp: 20 18 18 20   Temp: 98 F (36.7 C) 98.3 F (36.8 C) 98.1 F (36.7 C) 98.9 F (37.2 C)  TempSrc: Oral Oral Oral Oral  SpO2: 96% 94% 97% 95%  Weight: 99.2 kg (218 lb 12.8 oz)   97.8 kg (215 lb 9.6 oz)  Height:        Intake/Output Summary (Last 24 hours) at 01/10/17 0837 Last data filed at 01/10/17 0600  Gross per 24 hour  Intake             1385 ml  Output             4100 ml  Net            -2715 ml   Filed Weights   01/08/17 0231 01/09/17 0618 01/10/17 0535  Weight:  99.4 kg (219 lb 3.2 oz) 99.2 kg (218 lb 12.8 oz) 97.8 kg (215 lb 9.6 oz)     Physical Exam   General: WD/WN, NAD Head: Normal  Neck: Supple Lungs:  Diminished BS RLL Heart: RRR Abdomen: Soft, non-tender, non-distended, no masses. Extremities: No edema.  Neuro: Grossly intact   Labs    Chemistry  Recent Labs Lab 01/06/17 0944 01/07/17 0540 01/08/17 0735 01/09/17 0624 01/10/17 0329  NA 145 144 143 141 140  K 4.4 4.2 4.3 4.0 3.8  CL 114* 112* 111 107 105  CO2 21* 21* 21* 26 26  GLUCOSE 153* 136* 138* 126* 129*  BUN 67* 67* 69* 72* 73*  CREATININE 3.84* 3.90* 4.21* 4.29* 4.37*  CALCIUM 9.7 9.6 9.3 9.8 9.5  PROT 6.0* 5.5* 5.5*  --   --   ALBUMIN 3.3* 3.3* 3.1*  --   --   AST 39 25 21  --   --   ALT 115* 89* 73*  --   --  ALKPHOS 73 63 62  --   --   BILITOT 0.6 0.7 0.3  --   --   GFRNONAA 11* 11* 10* 9* 9*  GFRAA 13* 12* 11* 11* 11*  ANIONGAP 10 11 11 8 9      Hematology  Recent Labs Lab 01/05/17 0357 01/06/17 0944 01/07/17 0540  WBC 7.7 8.4 7.0  RBC 2.67* 2.89* 2.76*  HGB 7.7* 8.5* 7.9*  HCT 24.8* 27.7* 25.7*  MCV 92.9 95.8 93.1  MCH 28.8 29.4 28.6  MCHC 31.0 30.7 30.7  RDW 16.6* 17.5* 17.3*  PLT 204 196 185    Cardiac Enzymes  Recent Labs Lab 01/04/17 1744 01/04/17 2144 01/05/17 0357 01/05/17 1116  TROPONINI 0.06* 0.07* 0.06* 0.06*    BNP  Recent Labs Lab 01/04/17 1744 01/05/17 0357  BNP 899.1* 1,317.6*     Telemetry    Sinus rhythm - Personally Reviewed   Cardiac Studies   Echocardiogram 01/05/17: Study Conclusions - Left ventricle: The cavity size was normal. Wall thickness was   increased in a pattern of severe LVH. Systolic function was   normal. The estimated ejection fraction was in the range of 50%   to 55%. Wall motion was normal; there were no regional wall   motion abnormalities. Doppler parameters are consistent with   abnormal left ventricular relaxation (grade 1 diastolic   dysfunction). Doppler parameters are  consistent with high   ventricular filling pressure. - Aortic valve: Mildly calcified annulus. Trileaflet; normal   thickness leaflets. Valve area (VTI): 3.61 cm^2. Valve area   (Vmax): 3.31 cm^2. Valve area (Vmean): 3.39 cm^2. - Mitral valve: Mildly calcified annulus. Normal thickness leaflets   . Valve area by continuity equation (using LVOT flow): 2.66 cm^2. - Left atrium: The atrium was mildly to moderately dilated. - Right atrium: The atrium was mildly dilated. - Atrial septum: No defect or patent foramen ovale was identified. - Pericardium, extracardiac: There is a large left pleural   effusion. There is a trivial circumferential pericardial   effusion. There is no evidence of tamponade physiology.    Patient Profile     71 y.o. female admitted with acute on chronic diastolic congestive heart failure and acute on chronic kidney disease. Echocardiogram shows normal LV systolic function, severe left ventricular hypertrophy, grade 1 diastolic dysfunction and biatrial enlargement.  Assessment & Plan    1. Acute on chronic diastolic congestive heart failure - [id number]. Change lasix to 120 mg po BID. Follow renal function.    2. Acute on chronic stage IV kidney disease - Renal function unchanged compared to yesterday; followed by nephrology.  3. elevated troponin -  0.06 --> 0.07 --> 0.06 --> 0.06 --> 0.06  - not consistent with ACS, no further ischemic evaluation at this time   4. Hypertension  - BP controlled; continue present meds   5. Right pleural effusion.  - Repeat chest x-ray this AM; may need thoracentesis.   Signed, Kirk Ruths , MD 8:37 AM 01/10/2017

## 2017-01-10 NOTE — Progress Notes (Signed)
Patient returned from IR.  Bandaid to right back is has small blood stain, but not actively bleeding.  Will continue to monitor patient.

## 2017-01-10 NOTE — Procedures (Signed)
PROCEDURE SUMMARY:  Successful US guided therapeutic right thoracentesis. Yielded 925 mL of clear, yellow fluid. Pt tolerated procedure well. No immediate complications.  Specimen was not sent for labs. CXR ordered.  Docia Barrier PA-C 01/10/2017 3:59 PM

## 2017-01-10 NOTE — Consult Note (Signed)
   [facility] Inpatient Consult   01/10/2017  Alyssa [name] [birth year] [id number]  Patient evaluated for community based chronic disease management services with [facility] as a benefit of patient's [employer]. Chart review reveals patient admitted with Acute Diastolic HF, CKD stage 3 and Diabetes type 2.   Spoke with patient and daughter [POA] Alyssa [name] at bedside to explain [place] [facility] services. Patient's primary care provider is new with Dr. Domenick Gong for February 08, 2017.  Explained that patient should be seen sooner if possible for post hospital follow up.  Patient has a scale but the daughter said the tele-monitoring scale would be very helpful given her "mom's conditions."  Patient will receive post hospital discharge call and will be evaluated for monthly home visits for assessments and disease process education.  Left contact information and [facility] literature at bedside. Made Inpatient Case Manager aware that [facility] following. Of note, [facility] services does not replace or interfere with any services that are arranged by inpatient case management or social work.  For additional questions or referrals please contact:    Natividad Brood, RN BSN [facility] Liaison  [phone] business mobile phone Toll free office [phone]     .

## 2017-01-11 ENCOUNTER — Encounter (HOSPITAL_COMMUNITY): Payer: Self-pay | Admitting: Student

## 2017-01-11 DIAGNOSIS — N183 Chronic kidney disease, stage 3 (moderate): Secondary | ICD-10-CM

## 2017-01-11 DIAGNOSIS — N170 Acute kidney failure with tubular necrosis: Secondary | ICD-10-CM

## 2017-01-11 LAB — BASIC METABOLIC PANEL
ANION GAP: 10 (ref 5–15)
BUN: 69 mg/dL — AB (ref 6–20)
CALCIUM: 9.4 mg/dL (ref 8.9–10.3)
CO2: 27 mmol/L (ref 22–32)
CREATININE: 4.14 mg/dL — AB (ref 0.44–1.00)
Chloride: 104 mmol/L (ref 101–111)
GFR calc Af Amer: 11 mL/min — ABNORMAL LOW (ref >60)
GFR, EST NON AFRICAN AMERICAN: 10 mL/min — AB (ref >60)
GLUCOSE: 128 mg/dL — AB (ref 65–99)
Potassium: 3.6 mmol/L (ref 3.5–5.1)
Sodium: 141 mmol/L (ref 135–145)

## 2017-01-11 LAB — GLUCOSE, CAPILLARY
GLUCOSE-CAPILLARY: 255 mg/dL — AB (ref 65–99)
GLUCOSE-CAPILLARY: 239 mg/dL — AB (ref 65–99)
Glucose-Capillary: 125 mg/dL — ABNORMAL HIGH (ref 65–99)
Glucose-Capillary: 185 mg/dL — ABNORMAL HIGH (ref 65–99)
Glucose-Capillary: 262 mg/dL — ABNORMAL HIGH (ref 65–99)

## 2017-01-11 NOTE — Progress Notes (Signed)
PROGRESS NOTE                                                                                                                                                                                                             Patient Demographics:    Alyssa [name], is a 71 y.o. female, DOB - 10-29-1945, VFI:[id number]  Admit date - 01/04/2017   Admitting Physician Benito Mccreedy, MD  Outpatient Primary MD for the patient is Patient, No Pcp Per  LOS - 5  Outpatient Specialists: none  Chief Complaint  Patient presents with  . Abdominal Pain       Brief Narrative   71 year old female with history of type 2 diabetes mellitus, hypertension, hyperlipidemia presented feeling poorly for several days. Reported easy fatigability and shortness of breath on exertion. Denied fever, chills or travel. Denies recent illness. Patient has not been seeing a PCP last few months after having a disagreement regarding her care. Patient found to have acute on chronic CHF with right-sided moderate pleural effusion, acute on chronic kidney disease stage III and anemia. Cardiology and nephrology following.   Subjective:   She does not want to talk much, flat affect, daughter in room She denies pain No edema    Assessment  & Plan :   Principal problem Acute on chronic systolic CHF. 2-D echo with EF of 50-55%.  -Transitioned to oral Lasix 120 mg twice a day. Good urine output (4.1 L with with down 3 lbs further).  Continue aspirin and Coreg. Added BiDil. ARB discontinued due to worsened renal function. -cardiology following, input appreciated  Acute on chronic kidney disease stage III - Suspecting cardiorenal syndrome, diabetic nephropathy and medication nonadherence also likely contributing.  ? AIN secondary to PPI which is now discontinued. Creatinine continues to rise (4.37 today, baseline 1.6). Patient however making good urine. Continue  current dose of oral Lasix. Hopefully renal function will plateau and improve.  Further recommendation for nephrology.  Moderate right pleural effusion Secondary to CHF. Repeat Chest x-ray on 6/21 shows increasing bilateral pleural effusion.  s/p "Successful ultrasound guided therapeutic right thoracentesis yielding 925 mL of pleural fluid." by IR  Elevated troponin associated with acute CHF. No chest pain symptoms or EKG changes.  Uncontrolled type 2 diabetes mellitus with hyperglycemia A1c of 7.4. Patient not following with current PCP due to disagreement  regarding her plan of care.  Continue Lantus and pre-meal aspart.   Transaminitis Ultrasound abdomen shows gallbladder sludge and CBD dilated measuring up to 9 mm. No Murphy's sign. LFTs slowly improving.  Discontinue statin.  Hypothyroidism Synthroid dose increased for elevated TSH.  Anemia of chronic kidney disease. Colonoscopy in 2015 showed sessile polyps. FOBT negative. CT abdomen and pelvis unremarkable. Iron panel and B12 normal. Received Feraheme and Aranesp.  Hypomagnesemia  Replenished.  Chronic depression Seems to have worsened after her husband death last year. Patient was on Zoloft previously and daughter informs that it did not help. Offered her to start another antidepressant but patient declined.     Code Status : Full code  Family Communication  : Daughter at bedside  Disposition:  Wean oxygen, ambulate, home with home health on 6/23   Consults  :   Cardiology Nephrology  Procedures  : 2-D echo CT chest Renal ultrasound  DVT Prophylaxis  :  Heparin  Lab Results  Component Value Date   PLT 185 01/07/2017    Antibiotics  :    Anti-infectives    None        Objective:   Vitals:   01/10/17 1202 01/10/17 2004 01/11/17 0645 01/11/17 0900  BP: (!) 119/45 (!) 142/58 (!) 152/69 (!) 122/43  Pulse: 60 73 76 68  Resp: 20 20 20 17   Temp: 98.5 F (36.9 C) 98.4 F (36.9 C) 98.9 F (37.2  C)   TempSrc: Oral Oral Oral   SpO2: 95% 100% 96% 100%  Weight:   95.5 kg (210 lb 9.6 oz)   Height:        Wt Readings from Last 3 Encounters:  01/11/17 95.5 kg (210 lb 9.6 oz)  09/19/16 90.1 kg (198 lb 9.6 oz)  09/06/16 94.3 kg (208 lb)     Intake/Output Summary (Last 24 hours) at 01/11/17 1037 Last data filed at 01/11/17 0648  Gross per 24 hour  Intake             1083 ml  Output             2400 ml  Net            -1317 ml     Physical Exam Gen.: Elderly female not in distress, continues to have flat affect, does not talk much HEENT: Moist mucosa, supple neck Chest: Improved right-sided breath sounds with fine right basilar rales CVS: Normal S1 and S2, no murmurs GI: Soft, nondistended, nontender Musculoskeletal: previously documented 1+ pitting edema bilaterally (R >L) has largely resolved        Data Review:    CBC  Recent Labs Lab 01/04/17 1744 01/05/17 0357 01/06/17 0944 01/07/17 0540  WBC 7.1 7.7 8.4 7.0  HGB 8.3* 7.7* 8.5* 7.9*  HCT 26.2* 24.8* 27.7* 25.7*  PLT 233 204 196 185  MCV 92.3 92.9 95.8 93.1  MCH 29.2 28.8 29.4 28.6  MCHC 31.7 31.0 30.7 30.7  RDW 16.5* 16.6* 17.5* 17.3*  LYMPHSABS  --  1.2  --   --   MONOABS  --  0.3  --   --   EOSABS  --  0.1  --   --   BASOSABS  --  0.0  --   --     Chemistries   Recent Labs Lab 01/04/17 1322 01/05/17 0357 01/06/17 0944 01/07/17 0540 01/08/17 0735 01/09/17 0624 01/10/17 0329 01/11/17 0510  NA 143 143 145 144 143 141 140 141  K 4.5 4.6  4.4 4.2 4.3 4.0 3.8 3.6  CL 112* 112* 114* 112* 111 107 105 104  CO2 21* 23 21* 21* 21* 26 26 27   GLUCOSE 237* 265* 153* 136* 138* 126* 129* 128*  BUN 71* 68* 67* 67* 69* 72* 73* 69*  CREATININE 3.54* 3.65* 3.84* 3.90* 4.21* 4.29* 4.37* 4.14*  CALCIUM 10.1 9.7 9.7 9.6 9.3 9.8 9.5 9.4  MG  --  1.6*  --   --   --   --   --   --   AST 81* 53* 39 25 21  --   --   --   ALT 192* 152* 115* 89* 73*  --   --   --   ALKPHOS 88 81 73 63 62  --   --   --     BILITOT 0.6 0.5 0.6 0.7 0.3  --   --   --    ------------------------------------------------------------------------------------------------------------------ No results for input(s): CHOL, HDL, LDLCALC, TRIG, CHOLHDL, LDLDIRECT in the last 72 hours.  Lab Results  Component Value Date   HGBA1C 7.4 (H) 01/04/2017   ------------------------------------------------------------------------------------------------------------------ No results for input(s): TSH, T4TOTAL, T3FREE, THYROIDAB in the last 72 hours.  Invalid input(s): FREET3 ------------------------------------------------------------------------------------------------------------------ No results for input(s): VITAMINB12, FOLATE, FERRITIN, TIBC, IRON, RETICCTPCT in the last 72 hours.  Coagulation profile No results for input(s): INR, PROTIME in the last 168 hours.  No results for input(s): DDIMER in the last 72 hours.  Cardiac Enzymes  Recent Labs Lab 01/04/17 2144 01/05/17 0357 01/05/17 1116  TROPONINI 0.07* 0.06* 0.06*   ------------------------------------------------------------------------------------------------------------------    Component Value Date/Time   BNP 1,317.6 (H) 01/05/2017 0357   BNP 36.6 10/29/2013 1828    Inpatient Medications  Scheduled Meds: . aspirin  81 mg Oral Daily  . carvedilol  3.125 mg Oral BID WC  . cholecalciferol  5,000 Units Oral QPM  . darbepoetin (ARANESP) injection - NON-DIALYSIS  60 mcg Subcutaneous Q Tue-1800  . furosemide  120 mg Oral BID  . insulin aspart  0-15 Units Subcutaneous TID WC  . insulin aspart  0-5 Units Subcutaneous QHS  . insulin aspart  3 Units Subcutaneous TID WC  . insulin glargine  8 Units Subcutaneous Daily  . isosorbide-hydrALAZINE  1 tablet Oral TID  . latanoprost  1 drop Both Eyes QPM  . levothyroxine  75 mcg Oral QAC breakfast  . minoxidil  5 mg Oral Daily  . multivitamin with minerals  1 tablet Oral Daily  . omega-3 acid ethyl esters  1 g  Oral Daily  . polyethylene glycol  17 g Oral Daily  . sodium chloride flush  10-40 mL Intracatheter Q12H  . sodium chloride flush  3 mL Intravenous Q12H  . timolol  1 drop Both Eyes BID  . vitamin B-12  1,000 mcg Oral QODAY   Continuous Infusions: . sodium chloride     PRN Meds:.sodium chloride, acetaminophen, lidocaine, ondansetron (ZOFRAN) IV, sodium chloride flush, sodium chloride flush  Micro Results No results found for this or any previous visit (from the past 240 hour(s)).  Radiology Reports Ct Abdomen Pelvis Wo Contrast  Result Date: 01/04/2017 CLINICAL DATA:  Dizziness.  Abdominal pain with nausea and vomiting. EXAM: CT CHEST, ABDOMEN AND PELVIS WITHOUT CONTRAST TECHNIQUE: Multidetector CT imaging of the chest, abdomen and pelvis was performed following the standard protocol without IV contrast. COMPARISON:  None. FINDINGS: CT CHEST FINDINGS Cardiovascular: Cardiomegaly with small volume pericardial fluid. Enlarged main pulmonary artery at 4 cm, usually seen with pulmonary hypertension.  Diffuse atherosclerotic calcification of the coronaries. Multifocal aortic and great vessel calcification. No acute vascular finding. Mediastinum/Nodes: Hilar enlargement it appears vascular. No adenopathy noted. Negative esophagus. Lungs/Pleura: Moderate layering pleural effusions, greater on the right. Multi segment atelectasis. The aerated lung shows no edema. No pneumothorax. Musculoskeletal: See below CT ABDOMEN PELVIS FINDINGS Hepatobiliary: No focal liver abnormality.Subtle layering calculi. No inflammatory changes in the biliary tree. Normal common bile duct diameter. Pancreas: Unremarkable. Spleen: Unremarkable. Adrenals/Urinary Tract: Negative adrenals. No hydronephrosis or stone. Hilar calcifications appear atherosclerotic. Unremarkable bladder. Stomach/Bowel: No obstruction. No appendicitis. Extensive sigmoid diverticulosis. Vascular/Lymphatic: Diffuse atherosclerotic calcification. No noted  adenopathy. No mass or adenopathy. Reproductive:Fibroid uterus with 4 cm calcified/ hyalinized fibroid. Other: No ascites or pneumoperitoneum.  Anasarca. Musculoskeletal: No acute finding. Severe lumbar facet arthropathy. Status post L4-5 fusion. L5-S1 ankylosis or fusion. Extensive thoracic spondylosis with multi-level ankylosis. Advanced glenohumeral osteoarthritis. Due to patient size, some of the abdominal wall is not visible. IMPRESSION: 1. Cardiomegaly with volume overload. Moderate right and small left pleural effusions with multi segment atelectasis. Extensive anasarca. 2. Probable pulmonary hypertension. 3. Extensive colonic diverticulosis. 4. Cholelithiasis. Electronically Signed   By: Monte Fantasia M.D.   On: 01/04/2017 20:04   Dg Chest 1 View  Result Date: 01/10/2017 CLINICAL DATA:  Status post thoracentesis. EXAM: CHEST 1 VIEW COMPARISON:  Chest radiograph January 10, 2017 at 0938 hours FINDINGS: Similar blunting of LEFT costophrenic angle, RIGHT hemidiaphragm is no longer elevated, no RIGHT pleural effusion. Moderate cardiomegaly. Calcified aortic knob. Mild pulmonary vascular congestion and mild interstitial prominence. RIGHT lung atelectasis. No pneumothorax. Soft tissue planes and included osseous structures are nonsuspicious. Severe glenohumeral osteoarthrosis. IMPRESSION: Small LEFT pleural effusion versus pleural thickening, resolved RIGHT pleural effusion by AP radiograph. No pneumothorax. Stable cardiomegaly and interstitial prominence most compatible pulmonary edema. Electronically Signed   By: Elon Alas M.D.   On: 01/10/2017 16:13   Dg Chest 2 View  Result Date: 01/10/2017 CLINICAL DATA:  CHF, right lower lobe atelectasis or pneumonia, possible pleural effusion. EXAM: CHEST  2 VIEW COMPARISON:  PA and lateral chest x-ray of January 07, 2017 and chest CT scan of January 04, 2017. FINDINGS: The cardiac silhouette remains enlarged. There small bilateral pleural effusions greatest on the  right. Density at the right lung base persists. The pulmonary vascularity is mildly engorged. IMPRESSION: CHF with right basilar atelectasis or pneumonia. Bilateral pleural effusions which have increased slightly in volume since the previous study. Electronically Signed   By: David  Martinique M.D.   On: 01/10/2017 09:56   Dg Chest 2 View  Result Date: 01/07/2017 CLINICAL DATA:  Chronic diastolic congestive heart failure. EXAM: CHEST  2 VIEW COMPARISON:  CT chest 01/04/2017 and chest radiograph 01/11/2015. FINDINGS: Trachea is midline. Heart is enlarged. Basilar dependent mixed interstitial and airspace opacification with consolidation in the right lower lobe. Small bilateral pleural effusions. Degenerative changes in shoulders. IMPRESSION: 1. Congestive heart failure. 2. Consolidation in the right lower lobe. Pneumonia cannot be excluded. Electronically Signed   By: Lorin Picket M.D.   On: 01/07/2017 07:38   Ct Head Wo Contrast  Result Date: 01/04/2017 CLINICAL DATA:  Dizziness and weakness. EXAM: CT HEAD WITHOUT CONTRAST TECHNIQUE: Contiguous axial images were obtained from the base of the skull through the vertex without intravenous contrast. COMPARISON:  10/12/2013 head CT, MRI 01/13/2014 FINDINGS: Brain: Chronic moderate superficial atrophy with small vessel ischemic disease of periventricular and subcortical white matter. No acute intracranial hemorrhage, midline shift or edema. No extra-axial fluid collections.  The basal cisterns and fourth ventricle are midline. Vascular: Atherosclerotic calcifications of the vertebral arteries and cavernous sinus carotids. No hyperdense vessels. Skull: Negative for fracture or focal lesions. Sinuses/Orbits: No acute finding. Other: None. IMPRESSION: Chronic moderate superficial atrophy with small vessel ischemic disease. No acute intracranial abnormality. Electronically Signed   By: Ashley Royalty M.D.   On: 01/04/2017 19:47   Ct Chest Wo Contrast  Result Date:  01/04/2017 CLINICAL DATA:  Dizziness.  Abdominal pain with nausea and vomiting. EXAM: CT CHEST, ABDOMEN AND PELVIS WITHOUT CONTRAST TECHNIQUE: Multidetector CT imaging of the chest, abdomen and pelvis was performed following the standard protocol without IV contrast. COMPARISON:  None. FINDINGS: CT CHEST FINDINGS Cardiovascular: Cardiomegaly with small volume pericardial fluid. Enlarged main pulmonary artery at 4 cm, usually seen with pulmonary hypertension. Diffuse atherosclerotic calcification of the coronaries. Multifocal aortic and great vessel calcification. No acute vascular finding. Mediastinum/Nodes: Hilar enlargement it appears vascular. No adenopathy noted. Negative esophagus. Lungs/Pleura: Moderate layering pleural effusions, greater on the right. Multi segment atelectasis. The aerated lung shows no edema. No pneumothorax. Musculoskeletal: See below CT ABDOMEN PELVIS FINDINGS Hepatobiliary: No focal liver abnormality.Subtle layering calculi. No inflammatory changes in the biliary tree. Normal common bile duct diameter. Pancreas: Unremarkable. Spleen: Unremarkable. Adrenals/Urinary Tract: Negative adrenals. No hydronephrosis or stone. Hilar calcifications appear atherosclerotic. Unremarkable bladder. Stomach/Bowel: No obstruction. No appendicitis. Extensive sigmoid diverticulosis. Vascular/Lymphatic: Diffuse atherosclerotic calcification. No noted adenopathy. No mass or adenopathy. Reproductive:Fibroid uterus with 4 cm calcified/ hyalinized fibroid. Other: No ascites or pneumoperitoneum.  Anasarca. Musculoskeletal: No acute finding. Severe lumbar facet arthropathy. Status post L4-5 fusion. L5-S1 ankylosis or fusion. Extensive thoracic spondylosis with multi-level ankylosis. Advanced glenohumeral osteoarthritis. Due to patient size, some of the abdominal wall is not visible. IMPRESSION: 1. Cardiomegaly with volume overload. Moderate right and small left pleural effusions with multi segment atelectasis.  Extensive anasarca. 2. Probable pulmonary hypertension. 3. Extensive colonic diverticulosis. 4. Cholelithiasis. Electronically Signed   By: Monte Fantasia M.D.   On: 01/04/2017 20:04   US Abdomen Limited Ruq  Result Date: 01/05/2017 CLINICAL DATA:  Abnormal liver functions EXAM: ULTRASOUND ABDOMEN LIMITED RIGHT UPPER QUADRANT COMPARISON:  CT scan October 05, 2014 FINDINGS: Gallbladder: Sludge is seen in the gallbladder with no shadowing to definitively suggest stones. No Murphy's sign or pericholecystic fluid. The gallbladder wall measures up to 3.5 mm. Common bile duct: Diameter: 9 mm Liver: An echogenic mass in the liver measures up to 16 mm. No definitive correlate on the October 05, 2014 CT scan. Incidentally, a right pleural effusion is identified. IMPRESSION: 1. Significant sludge in the gallbladder. No definitive stones are visualized today but suspected small stones were seen on a CT scan in 2016. The gallbladder wall is mildly thickened. No Murphy's sign or pericholecystic fluid identified. 2. The common bile duct is dilated measuring up to 9 mm. Consider an MRCP to evaluate for choledocholithiasis. Electronically Signed   By: Dorise Bullion III M.D   On: 01/05/2017 18:58   Ir Thoracentesis Asp Pleural Space W/img Guide  Result Date: 01/11/2017 INDICATION: Patient with history of CHF and right pleural effusion. Request is made for right thoracentesis. EXAM: ULTRASOUND GUIDED THERAPEUTIC RIGHT THORACENTESIS MEDICATIONS: 10 mL 1% lidocaine COMPLICATIONS: None immediate. PROCEDURE: An ultrasound guided thoracentesis was thoroughly discussed with the patient and questions answered. The benefits, risks, alternatives and complications were also discussed. The patient understands and wishes to proceed with the procedure. Written consent was obtained. Ultrasound was performed to localize and mark an adequate pocket  of fluid in the right chest. The area was then prepped and draped in the normal sterile  fashion. 1% Lidocaine was used for local anesthesia. Under ultrasound guidance a Safe-T-centesis catheter was introduced. Thoracentesis was performed. The catheter was removed and a dressing applied. FINDINGS: A total of approximately 925 mL of clear, yellow fluid was removed. IMPRESSION: Successful ultrasound guided therapeutic right thoracentesis yielding 925 mL of pleural fluid. Read by:  Brynda Greathouse PA-C Electronically Signed   By: Marybelle Killings M.D.   On: 01/10/2017 17:03    Time Spent in minutes 25 Case discussed with nephrology and cardiology  Corleone Biegler M.D PhD on 01/11/2017 at 10:37 AM  Between 7am to 7pm - Pager - [phone]  After 7pm go to [email or web address] - password [facility]  [facility] -  Office  [phone]

## 2017-01-11 NOTE — Care Management Note (Signed)
Case Management Note  Patient Details  Name: Alyssa [name] MRN: [id number] Date of Birth: January 02, 1946  Subjective/Objective:    Admitted with Acute CHF              Action/Plan: Patient lives at home alone; Daughter is very involved in her care; PCP is Dr Osborne Casco; has private insurance with [employer] with prescription drug coverage; pharmacy of choice is [facility]; DME- she has a walker at home; patient could benefit from [facility] but she is refusing all [place] services at this time; CM will continue to follow for DCP  Expected Discharge Date:  Possibly 01/11/2017               Expected Discharge Plan:  [place]  In-House Referral:   [facility]  Discharge planning Services  CM Consult  Choice offered to:  Patient, Adult Children  HH Arranged:  Patient Refused [place]  Status of Service:  In process, will continue to follow  Sherrilyn Rist [phone] 01/11/2017, 10:51 AM

## 2017-01-11 NOTE — Progress Notes (Signed)
Clarified with pharmacy on pt 120 mg oral lasix dose, pharmacy stated it is a safe dose to take orally, route does not need to be changed  Alyssa [name]

## 2017-01-11 NOTE — Progress Notes (Signed)
[facility] Progress Note    Assessment/ Plan:   1  Acute / chronic renal failure: I suspect cardiorenal syndrome as primary driver.  Also had pyruria on UA--> PPI d/c'd in case AIN playing a role. PO Lasix 120 mg BID. Is followed by Dr. Joelyn Oms in clinic.  Cr is starting to trend down and she appears euvolemic.  If Cr still going in the right direction tomorrow I think OK to d/c from renal perspective.  2  Acute/ chron diast CHF / vol overload: cardiology following.     3  HTN: cont home minoxidil; coreg and Bidil added here. BP's are good.  Do not recommend restarting ACEi/ARB  4  Gout- no active flare  5  Anemia: s/p feraheme and Aranesp 60 mcg 6/19  6.  H/o R pleural effusion: Repeat CXR demonstrates again R>L pleural effusions.  Thoracentesis 6/21  Subjective:    No acute events.  S/p thoracentesis yesterday with 925 mL fluid removed.  Pt sitting up in chair with dtr doing her hair this AM.   Objective:   BP (!) 143/48 (BP Location: Left Arm)   Pulse 64   Temp 98.4 F (36.9 C) (Oral)   Resp 20   Ht 5\' 3"  (1.6 m)   Wt 95.5 kg (210 lb 9.6 oz)   SpO2 95%   BMI 37.31 kg/m   Intake/Output Summary (Last 24 hours) at 01/11/17 1308 Last data filed at 01/11/17 1200  Gross per 24 hour  Intake              840 ml  Output             2800 ml  Net            -1960 ml   Weight change: -2.268 kg (-5 lb)  Physical Exam: Gen elderly female sitting in chair, NAD No jvd or bruits Chest: normal WOB, clear bilaterally which is much improved RRR question syst M, no RG Abd soft ntnd no mass or ascites +bs obese MS no joint effusions or deformity Ext no LE edema, improved Neuro is alert, Ox 3 , nf Imaging: Dg Chest 1 View  Result Date: 01/10/2017 CLINICAL DATA:  Status post thoracentesis. EXAM: CHEST 1 VIEW COMPARISON:  Chest radiograph January 10, 2017 at 0938 hours FINDINGS: Similar blunting of LEFT costophrenic angle, RIGHT hemidiaphragm is no longer elevated, no  RIGHT pleural effusion. Moderate cardiomegaly. Calcified aortic knob. Mild pulmonary vascular congestion and mild interstitial prominence. RIGHT lung atelectasis. No pneumothorax. Soft tissue planes and included osseous structures are nonsuspicious. Severe glenohumeral osteoarthrosis. IMPRESSION: Small LEFT pleural effusion versus pleural thickening, resolved RIGHT pleural effusion by AP radiograph. No pneumothorax. Stable cardiomegaly and interstitial prominence most compatible pulmonary edema. Electronically Signed   By: Elon Alas M.D.   On: 01/10/2017 16:13   Dg Chest 2 View  Result Date: 01/10/2017 CLINICAL DATA:  CHF, right lower lobe atelectasis or pneumonia, possible pleural effusion. EXAM: CHEST  2 VIEW COMPARISON:  PA and lateral chest x-ray of January 07, 2017 and chest CT scan of January 04, 2017. FINDINGS: The cardiac silhouette remains enlarged. There small bilateral pleural effusions greatest on the right. Density at the right lung base persists. The pulmonary vascularity is mildly engorged. IMPRESSION: CHF with right basilar atelectasis or pneumonia. Bilateral pleural effusions which have increased slightly in volume since the previous study. Electronically Signed   By: David  Martinique M.D.   On: 01/10/2017 09:56   Ir Thoracentesis Asp  Pleural Space W/img Guide  Result Date: 01/11/2017 INDICATION: Patient with history of CHF and right pleural effusion. Request is made for right thoracentesis. EXAM: ULTRASOUND GUIDED THERAPEUTIC RIGHT THORACENTESIS MEDICATIONS: 10 mL 1% lidocaine COMPLICATIONS: None immediate. PROCEDURE: An ultrasound guided thoracentesis was thoroughly discussed with the patient and questions answered. The benefits, risks, alternatives and complications were also discussed. The patient understands and wishes to proceed with the procedure. Written consent was obtained. Ultrasound was performed to localize and mark an adequate pocket of fluid in the right chest. The area was  then prepped and draped in the normal sterile fashion. 1% Lidocaine was used for local anesthesia. Under ultrasound guidance a Safe-T-centesis catheter was introduced. Thoracentesis was performed. The catheter was removed and a dressing applied. FINDINGS: A total of approximately 925 mL of clear, yellow fluid was removed. IMPRESSION: Successful ultrasound guided therapeutic right thoracentesis yielding 925 mL of pleural fluid. Read by:  Brynda Greathouse PA-C Electronically Signed   By: Marybelle Killings M.D.   On: 01/10/2017 17:03    Labs: BMET  Recent Labs Lab 01/05/17 0357 01/06/17 0944 01/07/17 0540 01/08/17 0735 01/09/17 0624 01/10/17 0329 01/11/17 0510  NA 143 145 144 143 141 140 141  K 4.6 4.4 4.2 4.3 4.0 3.8 3.6  CL 112* 114* 112* 111 107 105 104  CO2 23 21* 21* 21* 26 26 27   GLUCOSE 265* 153* 136* 138* 126* 129* 128*  BUN 68* 67* 67* 69* 72* 73* 69*  CREATININE 3.65* 3.84* 3.90* 4.21* 4.29* 4.37* 4.14*  CALCIUM 9.7 9.7 9.6 9.3 9.8 9.5 9.4   CBC  Recent Labs Lab 01/04/17 1744 01/05/17 0357 01/06/17 0944 01/07/17 0540  WBC 7.1 7.7 8.4 7.0  NEUTROABS  --  6.0  --   --   HGB 8.3* 7.7* 8.5* 7.9*  HCT 26.2* 24.8* 27.7* 25.7*  MCV 92.3 92.9 95.8 93.1  PLT 233 204 196 185    Medications:    . aspirin  81 mg Oral Daily  . carvedilol  3.125 mg Oral BID WC  . cholecalciferol  5,000 Units Oral QPM  . darbepoetin (ARANESP) injection - NON-DIALYSIS  60 mcg Subcutaneous Q Tue-1800  . furosemide  120 mg Oral BID  . insulin aspart  0-15 Units Subcutaneous TID WC  . insulin aspart  0-5 Units Subcutaneous QHS  . insulin aspart  3 Units Subcutaneous TID WC  . insulin glargine  8 Units Subcutaneous Daily  . isosorbide-hydrALAZINE  1 tablet Oral TID  . latanoprost  1 drop Both Eyes QPM  . levothyroxine  75 mcg Oral QAC breakfast  . minoxidil  5 mg Oral Daily  . multivitamin with minerals  1 tablet Oral Daily  . omega-3 acid ethyl esters  1 g Oral Daily  . polyethylene glycol  17 g  Oral Daily  . sodium chloride flush  10-40 mL Intracatheter Q12H  . sodium chloride flush  3 mL Intravenous Q12H  . timolol  1 drop Both Eyes BID  . vitamin B-12  1,000 mcg Oral QODAY      Madelon Lips, MD 01/11/2017, 1:08 PM

## 2017-01-11 NOTE — Evaluation (Signed)
Physical Therapy Evaluation Patient Details Name: Alyssa [name] MRN: [id number] DOB: 09-25-1945 Today's Date: 01/11/2017   History of Present Illness  Pt is a 71 yo female admitted through ED on 01/04/17 with acute CHF, acute on chronic CKD, anemia, DM2 and transminitis. PMH significant for DM2, HTN, HLD, anemia, glaucoma.   Clinical Impression  Pt presents with the above diagnosis and below deficits for therapy evaluation. Prior to admission, pt lived alone in a two-story townhouse with a chair lift leading to second level where bedroom and bathrooms are located. Pt requires Min guard for all mobility this session with O2 sats remaining at 90-91% throughout the session. Pt is refusing [facility] services at this time, though I feel this would be beneficial considering the length of her hospitalization. Pt will benefit from continued acute rehab services in order to address the below deficits prior to discharge.     Follow Up Recommendations Home health PT;Other (comment) (pt will likely refuse)    Equipment Recommendations  None recommended by PT    Recommendations for Other Services       Precautions / Restrictions Precautions Precautions: Fall Restrictions Weight Bearing Restrictions: No      Mobility  Bed Mobility Overal bed mobility: Modified Independent             General bed mobility comments: sitting up in recliner when PT arrives  Transfers Overall transfer level: Needs assistance Equipment used: Rolling walker (2 wheeled) Transfers: Sit to/from Stand Sit to Stand: Min guard         General transfer comment: Min guard for safety from recliner  Ambulation/Gait Ambulation/Gait assistance: Min guard Ambulation Distance (Feet): 120 Feet Assistive device: Rolling walker (2 wheeled) Gait Pattern/deviations: Step-through pattern Gait velocity: decreased Gait velocity interpretation: <1.8 ft/sec, indicative of risk for recurrent falls General Gait Details: slow steady  gait with RW. Good sequencing  Stairs Stairs:  (has stair lift at home, does not need to do stairs)          Wheelchair Mobility    Modified Rankin (Stroke Patients Only)       Balance Overall balance assessment: Needs assistance Sitting-balance support: No upper extremity supported;Feet supported Sitting balance-Leahy Scale: Normal     Standing balance support: No upper extremity supported Standing balance-Leahy Scale: Fair                               Pertinent Vitals/Pain Pain Assessment: No/denies pain    Home Living Family/patient expects to be discharged to:: Private residence Living Arrangements: Alone Available Help at Discharge: Family;Neighbor;Available PRN/intermittently Type of Home: Other(Comment) (townhouse) Home Access: Stairs to enter   [employer] of Steps: 2 Home Layout: Two level;Bed/bath upstairs Home Equipment: Walker - 2 wheels;Cane - single point;Bedside commode;Wheelchair - manual;Grab bars - toilet;Grab bars - tub/shower;Walker - 4 wheels      Prior Function Level of Independence: Independent with assistive device(s)         Comments: lives alone and uses RW     Hand Dominance   Dominant Hand: Right    Extremity/Trunk Assessment   Upper Extremity Assessment Upper Extremity Assessment: Overall WFL for tasks assessed    Lower Extremity Assessment Lower Extremity Assessment: Generalized weakness    Cervical / Trunk Assessment Cervical / Trunk Assessment: Normal  Communication   Communication: No difficulties  Cognition Arousal/Alertness: Awake/alert Behavior During Therapy: WFL for tasks assessed/performed;Flat affect Overall Cognitive Status: Within Functional Limits  for tasks assessed                                        General Comments General comments (skin integrity, edema, etc.): O2 sats checked throughout session. 90% on RA before, during and after gait.     Exercises      Assessment/Plan    PT Assessment Patient needs continued PT services  PT Problem List Decreased activity tolerance;Decreased balance;Decreased mobility       PT Treatment Interventions DME instruction;Gait training;Stair training;Functional mobility training;Therapeutic activities;Therapeutic exercise;Balance training    PT Goals (Current goals can be found in the Care Plan section)  Acute Rehab PT Goals Patient Stated Goal: to get home today PT Goal Formulation: With patient Time For Goal Achievement: 01/18/17 Potential to Achieve Goals: Good    Frequency Min 3X/week   Barriers to discharge        Co-evaluation               AM-PAC PT "6 Clicks" Daily Activity  Outcome Measure Difficulty turning over in bed (including adjusting bedclothes, sheets and blankets)?: None Difficulty moving from lying on back to sitting on the side of the bed? : None Difficulty sitting down on and standing up from a chair with arms (e.g., wheelchair, bedside commode, etc,.)?: Total Help needed moving to and from a bed to chair (including a wheelchair)?: A Little Help needed walking in hospital room?: A Little Help needed climbing 3-5 steps with a railing? : A Little 6 Click Score: 18    End of Session Equipment Utilized During Treatment: Gait belt Activity Tolerance: Patient tolerated treatment well Patient left: in chair;with call bell/phone within reach;with family/visitor present Nurse Communication: Mobility status PT Visit Diagnosis: Unsteadiness on feet (R26.81);Other abnormalities of gait and mobility (R26.89)    Time: 4496-7591 PT Time Calculation (min) (ACUTE ONLY): 13 min   Charges:   PT Evaluation $PT Eval Moderate Complexity: 1 Procedure     PT G Codes:        Scheryl Marten PT, DPT  [phone]   Shanon Rosser 01/11/2017, 3:39 PM

## 2017-01-11 NOTE — Progress Notes (Signed)
Pt ambulated hallway with mobility tech. Pt bathing now, will try ambulation saturations again later. Informed physical therapy in hallway of pt order for imminent discharge  Jeanee Fabre Leory Plowman

## 2017-01-11 NOTE — Progress Notes (Signed)
Progress Note  Patient Name: Alyssa [name] Date of Encounter: 01/11/2017  Primary Cardiologist: Dr. Claiborne Billings  Subjective   No chest pain; states breathing is unchanged but has not ambulated   Inpatient Medications    Scheduled Meds: . aspirin  81 mg Oral Daily  . carvedilol  3.125 mg Oral BID WC  . cholecalciferol  5,000 Units Oral QPM  . darbepoetin (ARANESP) injection - NON-DIALYSIS  60 mcg Subcutaneous Q Tue-1800  . furosemide  120 mg Oral BID  . insulin aspart  0-15 Units Subcutaneous TID WC  . insulin aspart  0-5 Units Subcutaneous QHS  . insulin aspart  3 Units Subcutaneous TID WC  . insulin glargine  8 Units Subcutaneous Daily  . isosorbide-hydrALAZINE  1 tablet Oral TID  . latanoprost  1 drop Both Eyes QPM  . levothyroxine  75 mcg Oral QAC breakfast  . minoxidil  5 mg Oral Daily  . multivitamin with minerals  1 tablet Oral Daily  . omega-3 acid ethyl esters  1 g Oral Daily  . polyethylene glycol  17 g Oral Daily  . sodium chloride flush  10-40 mL Intracatheter Q12H  . sodium chloride flush  3 mL Intravenous Q12H  . timolol  1 drop Both Eyes BID  . vitamin B-12  1,000 mcg Oral QODAY   Continuous Infusions: . sodium chloride     PRN Meds: sodium chloride, acetaminophen, lidocaine, ondansetron (ZOFRAN) IV, sodium chloride flush, sodium chloride flush   Vital Signs    Vitals:   01/10/17 1202 01/10/17 2004 01/11/17 0645 01/11/17 0900  BP: (!) 119/45 (!) 142/58 (!) 152/69 (!) 122/43  Pulse: 60 73 76 68  Resp: 20 20 20 17   Temp: 98.5 F (36.9 C) 98.4 F (36.9 C) 98.9 F (37.2 C)   TempSrc: Oral Oral Oral   SpO2: 95% 100% 96% 100%  Weight:   95.5 kg (210 lb 9.6 oz)   Height:        Intake/Output Summary (Last 24 hours) at 01/11/17 1031 Last data filed at 01/11/17 0648  Gross per 24 hour  Intake             1083 ml  Output             2400 ml  Net            -1317 ml   Filed Weights   01/09/17 0618 01/10/17 0535 01/11/17 0645  Weight: 99.2 kg (218  lb 12.8 oz) 97.8 kg (215 lb 9.6 oz) 95.5 kg (210 lb 9.6 oz)     Physical Exam   General: Obese NAD Head: Normal with normal eyelids Neck: No JVD Lungs:  CTA Heart: RRR Abdomen: Soft, non-tender, non-distended Extremities: No edema. No Chords Neuro: No focal findings   Labs    Chemistry  Recent Labs Lab 01/06/17 0944 01/07/17 0540 01/08/17 0735 01/09/17 0624 01/10/17 0329 01/11/17 0510  NA 145 144 143 141 140 141  K 4.4 4.2 4.3 4.0 3.8 3.6  CL 114* 112* 111 107 105 104  CO2 21* 21* 21* 26 26 27   GLUCOSE 153* 136* 138* 126* 129* 128*  BUN 67* 67* 69* 72* 73* 69*  CREATININE 3.84* 3.90* 4.21* 4.29* 4.37* 4.14*  CALCIUM 9.7 9.6 9.3 9.8 9.5 9.4  PROT 6.0* 5.5* 5.5*  --   --   --   ALBUMIN 3.3* 3.3* 3.1*  --   --   --   AST 39 25 21  --   --   --  ALT 115* 89* 73*  --   --   --   ALKPHOS 73 63 62  --   --   --   BILITOT 0.6 0.7 0.3  --   --   --   GFRNONAA 11* 11* 10* 9* 9* 10*  GFRAA 13* 12* 11* 11* 11* 11*  ANIONGAP 10 11 11 8 9 10      Hematology  Recent Labs Lab 01/05/17 0357 01/06/17 0944 01/07/17 0540  WBC 7.7 8.4 7.0  RBC 2.67* 2.89* 2.76*  HGB 7.7* 8.5* 7.9*  HCT 24.8* 27.7* 25.7*  MCV 92.9 95.8 93.1  MCH 28.8 29.4 28.6  MCHC 31.0 30.7 30.7  RDW 16.6* 17.5* 17.3*  PLT 204 196 185    Cardiac Enzymes  Recent Labs Lab 01/04/17 1744 01/04/17 2144 01/05/17 0357 01/05/17 1116  TROPONINI 0.06* 0.07* 0.06* 0.06*    BNP  Recent Labs Lab 01/04/17 1744 01/05/17 0357  BNP 899.1* 1,317.6*     Telemetry    Sinus rhythm - Personally Reviewed   Cardiac Studies   Echocardiogram 01/05/17: Study Conclusions - Left ventricle: The cavity size was normal. Wall thickness was   increased in a pattern of severe LVH. Systolic function was   normal. The estimated ejection fraction was in the range of 50%   to 55%. Wall motion was normal; there were no regional wall   motion abnormalities. Doppler parameters are consistent with   abnormal left  ventricular relaxation (grade 1 diastolic   dysfunction). Doppler parameters are consistent with high   ventricular filling pressure. - Aortic valve: Mildly calcified annulus. Trileaflet; normal   thickness leaflets. Valve area (VTI): 3.61 cm^2. Valve area   (Vmax): 3.31 cm^2. Valve area (Vmean): 3.39 cm^2. - Mitral valve: Mildly calcified annulus. Normal thickness leaflets   . Valve area by continuity equation (using LVOT flow): 2.66 cm^2. - Left atrium: The atrium was mildly to moderately dilated. - Right atrium: The atrium was mildly dilated. - Atrial septum: No defect or patent foramen ovale was identified. - Pericardium, extracardiac: There is a large left pleural   effusion. There is a trivial circumferential pericardial   effusion. There is no evidence of tamponade physiology.    Patient Profile     71 y.o. female admitted with acute on chronic diastolic congestive heart failure and acute on chronic kidney disease. Echocardiogram shows normal LV systolic function, severe left ventricular hypertrophy, grade 1 diastolic dysfunction and biatrial enlargement.  Assessment & Plan    1. Acute on chronic diastolic congestive heart failure - I/O-[id number]. Continue lasix 120 mg po BID. Follow renal function.    2. Acute on chronic stage IV kidney disease - Renal function improved compared to yesterday; continue present dose of lasix and fu with nephrology.  3. elevated troponin -  0.06 --> 0.07 --> 0.06 --> 0.06 --> 0.06  - not consistent with ACS, no further ischemic evaluation at this time   4. Hypertension  - BP controlled; continue present meds   5. Right pleural effusion.  - S/P thoracentesis. Would ambulate and begin physical therapy  Pt can be Dced from a cardiac standpoint in AM if stable and fu with Dr Claiborne Billings.   Signed, Kirk Ruths , MD 10:31 AM 01/11/2017

## 2017-01-11 NOTE — Progress Notes (Signed)
Pt ambulation saturations completed with physical therapy  Greysin Medlen Leory Plowman

## 2017-01-12 DIAGNOSIS — K7689 Other specified diseases of liver: Secondary | ICD-10-CM

## 2017-01-12 DIAGNOSIS — E119 Type 2 diabetes mellitus without complications: Secondary | ICD-10-CM

## 2017-01-12 DIAGNOSIS — Z794 Long term (current) use of insulin: Secondary | ICD-10-CM

## 2017-01-12 DIAGNOSIS — N179 Acute kidney failure, unspecified: Secondary | ICD-10-CM

## 2017-01-12 LAB — CBC
HEMATOCRIT: 27 % — AB (ref 36.0–46.0)
Hemoglobin: 8.4 g/dL — ABNORMAL LOW (ref 12.0–15.0)
MCH: 29.1 pg (ref 26.0–34.0)
MCHC: 31.1 g/dL (ref 30.0–36.0)
MCV: 93.4 fL (ref 78.0–100.0)
Platelets: 196 10*3/uL (ref 150–400)
RBC: 2.89 MIL/uL — ABNORMAL LOW (ref 3.87–5.11)
RDW: 16.2 % — AB (ref 11.5–15.5)
WBC: 5.7 10*3/uL (ref 4.0–10.5)

## 2017-01-12 LAB — COMPREHENSIVE METABOLIC PANEL
ALT: 35 U/L (ref 14–54)
ANION GAP: 9 (ref 5–15)
AST: 16 U/L (ref 15–41)
Albumin: 3.1 g/dL — ABNORMAL LOW (ref 3.5–5.0)
Alkaline Phosphatase: 60 U/L (ref 38–126)
BILIRUBIN TOTAL: 0.5 mg/dL (ref 0.3–1.2)
BUN: 68 mg/dL — ABNORMAL HIGH (ref 6–20)
CO2: 30 mmol/L (ref 22–32)
Calcium: 9.3 mg/dL (ref 8.9–10.3)
Chloride: 101 mmol/L (ref 101–111)
Creatinine, Ser: 4.28 mg/dL — ABNORMAL HIGH (ref 0.44–1.00)
GFR calc Af Amer: 11 mL/min — ABNORMAL LOW (ref >60)
GFR, EST NON AFRICAN AMERICAN: 9 mL/min — AB (ref >60)
Glucose, Bld: 159 mg/dL — ABNORMAL HIGH (ref 65–99)
POTASSIUM: 3.2 mmol/L — AB (ref 3.5–5.1)
Sodium: 140 mmol/L (ref 135–145)
TOTAL PROTEIN: 5.3 g/dL — AB (ref 6.5–8.1)

## 2017-01-12 LAB — GLUCOSE, CAPILLARY
Glucose-Capillary: 152 mg/dL — ABNORMAL HIGH (ref 65–99)
Glucose-Capillary: 254 mg/dL — ABNORMAL HIGH (ref 65–99)

## 2017-01-12 MED ORDER — FUROSEMIDE 40 MG PO TABS: 120.0000 mg | ORAL_TABLET | Freq: Two times a day (BID) | ORAL | 0 refills | Status: DC

## 2017-01-12 MED ORDER — ISOSORB DINITRATE-HYDRALAZINE 20-37.5 MG PO TABS: 1.0000 | ORAL_TABLET | Freq: Three times a day (TID) | ORAL | 0 refills | Status: DC

## 2017-01-12 MED ORDER — GLIPIZIDE 5 MG PO TABS: 2.5000 mg | ORAL_TABLET | Freq: Every day | ORAL | 0 refills | Status: DC

## 2017-01-12 MED ORDER — INSULIN GLARGINE 100 UNIT/ML ~~LOC~~ SOLN: 5.0000 [IU] | Freq: Every day | SUBCUTANEOUS | 0 refills | Status: AC

## 2017-01-12 MED ORDER — LEVOTHYROXINE SODIUM 75 MCG PO TABS: 75.0000 ug | ORAL_TABLET | Freq: Every day | ORAL | 0 refills | Status: DC

## 2017-01-12 MED ORDER — CARVEDILOL 3.125 MG PO TABS: 3.1250 mg | ORAL_TABLET | Freq: Two times a day (BID) | ORAL | 0 refills | Status: AC

## 2017-01-12 MED ORDER — POTASSIUM CHLORIDE CRYS ER 20 MEQ PO TBCR
40.0000 meq | EXTENDED_RELEASE_TABLET | Freq: Once | ORAL | Status: AC
  Administered 2017-01-12: 40 meq via ORAL
  Filled 2017-01-12: qty 2

## 2017-01-12 NOTE — Progress Notes (Signed)
[facility] Progress Note    Assessment/ Plan:   1  Acute / chronic renal failure: I suspect cardiorenal syndrome as primary driver.  Also had pyruria on UA--> PPI d/c'd in case AIN playing a role. PO Lasix 120 mg BID. Is followed by Dr. Joelyn Oms in clinic.  Pt on a stable dose of Lasix.  Her creatinine is fluctuating around 4 (which may be due to some fluid shifts surrounding her thoracentesis).  I think it's reasonable to d/c today with close followup with [name].  Pt's daughter has already made an appointment with Dr. Joelyn Oms in about 4 weeks.  I will arrange for her to have labs drawn Wed/Thurs next week with [name] to ensure stable renal function.    2  Acute/ chron diast CHF / vol overload: improved.  Cardiology following.  3  HTN: cont home minoxidil; coreg and Bidil added here. BP's are good.  Do not recommend restarting ACEi/ARB  4  Gout- no active flare  5  Anemia: s/p feraheme and Aranesp 60 mcg 6/19  6.  H/o R pleural effusion: Repeat CXR demonstrates again R>L pleural effusions.  Thoracentesis 6/21  Subjective:    No acute events.  Pt is eager to go home today.  Her creatinine is fluctuating around 4.     Objective:   BP (!) 134/47 (BP Location: Left Arm)   Pulse 69   Temp 98 F (36.7 C) (Oral)   Resp 18   Ht 5\' 3"  (1.6 m)   Wt 93.8 kg (206 lb 11.2 oz)   SpO2 96%   BMI 36.62 kg/m   Intake/Output Summary (Last 24 hours) at 01/12/17 1037 Last data filed at 01/12/17 9628  Gross per 24 hour  Intake             1200 ml  Output             1900 ml  Net             -700 ml   Weight change: -1.769 kg (-3 lb 14.4 oz)  Physical Exam: Gen elderly female sitting in chair, NAD No jvd or bruits Chest: normal WOB, clear bilaterally which is much improved RRR question syst M, no RG Abd soft ntnd no mass or ascites +bs obese MS no joint effusions or deformity Ext minimal LE edema, improved Neuro is alert, Ox 3 , nf Imaging: Dg Chest 1 View  Result Date:  01/10/2017 CLINICAL DATA:  Status post thoracentesis. EXAM: CHEST 1 VIEW COMPARISON:  Chest radiograph January 10, 2017 at 0938 hours FINDINGS: Similar blunting of LEFT costophrenic angle, RIGHT hemidiaphragm is no longer elevated, no RIGHT pleural effusion. Moderate cardiomegaly. Calcified aortic knob. Mild pulmonary vascular congestion and mild interstitial prominence. RIGHT lung atelectasis. No pneumothorax. Soft tissue planes and included osseous structures are nonsuspicious. Severe glenohumeral osteoarthrosis. IMPRESSION: Small LEFT pleural effusion versus pleural thickening, resolved RIGHT pleural effusion by AP radiograph. No pneumothorax. Stable cardiomegaly and interstitial prominence most compatible pulmonary edema. Electronically Signed   By: Elon Alas M.D.   On: 01/10/2017 16:13   Ir Thoracentesis Asp Pleural Space W/img Guide  Result Date: 01/11/2017 INDICATION: Patient with history of CHF and right pleural effusion. Request is made for right thoracentesis. EXAM: ULTRASOUND GUIDED THERAPEUTIC RIGHT THORACENTESIS MEDICATIONS: 10 mL 1% lidocaine COMPLICATIONS: None immediate. PROCEDURE: An ultrasound guided thoracentesis was thoroughly discussed with the patient and questions answered. The benefits, risks, alternatives and complications were also discussed. The patient understands and wishes to  proceed with the procedure. Written consent was obtained. Ultrasound was performed to localize and mark an adequate pocket of fluid in the right chest. The area was then prepped and draped in the normal sterile fashion. 1% Lidocaine was used for local anesthesia. Under ultrasound guidance a Safe-T-centesis catheter was introduced. Thoracentesis was performed. The catheter was removed and a dressing applied. FINDINGS: A total of approximately 925 mL of clear, yellow fluid was removed. IMPRESSION: Successful ultrasound guided therapeutic right thoracentesis yielding 925 mL of pleural fluid. Read by:  Brynda Greathouse PA-C Electronically Signed   By: Marybelle Killings M.D.   On: 01/10/2017 17:03    Labs: BMET  Recent Labs Lab 01/06/17 0944 01/07/17 0540 01/08/17 0735 01/09/17 0624 01/10/17 0329 01/11/17 0510 01/12/17 0355  NA 145 144 143 141 140 141 140  K 4.4 4.2 4.3 4.0 3.8 3.6 3.2*  CL 114* 112* 111 107 105 104 101  CO2 21* 21* 21* 26 26 27 30   GLUCOSE 153* 136* 138* 126* 129* 128* 159*  BUN 67* 67* 69* 72* 73* 69* 68*  CREATININE 3.84* 3.90* 4.21* 4.29* 4.37* 4.14* 4.28*  CALCIUM 9.7 9.6 9.3 9.8 9.5 9.4 9.3   CBC  Recent Labs Lab 01/06/17 0944 01/07/17 0540 01/12/17 0355  WBC 8.4 7.0 5.7  HGB 8.5* 7.9* 8.4*  HCT 27.7* 25.7* 27.0*  MCV 95.8 93.1 93.4  PLT 196 185 196    Medications:    . aspirin  81 mg Oral Daily  . carvedilol  3.125 mg Oral BID WC  . cholecalciferol  5,000 Units Oral QPM  . darbepoetin (ARANESP) injection - NON-DIALYSIS  60 mcg Subcutaneous Q Tue-1800  . furosemide  120 mg Oral BID  . insulin aspart  0-15 Units Subcutaneous TID WC  . insulin aspart  0-5 Units Subcutaneous QHS  . insulin aspart  3 Units Subcutaneous TID WC  . insulin glargine  8 Units Subcutaneous Daily  . isosorbide-hydrALAZINE  1 tablet Oral TID  . latanoprost  1 drop Both Eyes QPM  . levothyroxine  75 mcg Oral QAC breakfast  . minoxidil  5 mg Oral Daily  . multivitamin with minerals  1 tablet Oral Daily  . omega-3 acid ethyl esters  1 g Oral Daily  . polyethylene glycol  17 g Oral Daily  . sodium chloride flush  10-40 mL Intracatheter Q12H  . sodium chloride flush  3 mL Intravenous Q12H  . timolol  1 drop Both Eyes BID  . vitamin B-12  1,000 mcg Oral QODAY      Madelon Lips, MD 01/12/2017, 10:37 AM

## 2017-01-12 NOTE — Progress Notes (Signed)
Paged MD regarding un reconciled AVS  Alyssa [name] Leory Plowman

## 2017-01-12 NOTE — Care Management Note (Signed)
Case Management Note  Patient Details  Name: Alyssa [name] MRN: [id number] Date of Birth: Aug 19, 1945  Subjective/Objective:                    Action/Plan: Discharge planning  Expected Discharge Date:  01/12/17               Expected Discharge Plan:  [place]  In-House Referral:     Discharge planning Services  CM Consult  Post Acute Care Choice:    Choice offered to:  Patient, Adult Children  DME Arranged:    DME Agency:     HH Arranged:  RN, PT [place] Agency:  [facility] (formerly [facility])  Status of Service:  Completed, signed off  If discussed at [employer], dates discussed:    Additional Comments: CM spoke with pt for choice of home health agency. Pt chooses [facility].  Referral called to [facility] for HHRN/PT.  NO other CM needs were communicated. Dellie Catholic, RN 01/12/2017, 12:02 PM

## 2017-01-12 NOTE — Progress Notes (Signed)
MD called and stated pt now agreeable to home health care. Called case manager she talked to pt via phone and provided [facility] phone number, states they will be in contact on Monday with the family. Pt IV discontinued, catheter intact and telemetry removed. Pt discharge education went over at bedside with pt and pt daughter.P thas all belongings. Pt discharged via wheelchair with nurse   Melody Cirrincione Leory Plowman

## 2017-01-12 NOTE — Discharge Summary (Signed)
Discharge Summary  [name]:[id number] DOB: 1945-10-01  PCP: Patient, No Pcp Per  Admit date: 01/04/2017 Discharge date: 01/12/2017  Time spent: >103mns, more than 50% time spent on patient counseling   Recommendations for Outpatient Follow-up:  1. F/u with PMD within a week  for hospital discharge follow up, repeat cbc/bmp at follow up 2. F/u with nephrology  Dr SJoelyn Oms3. F/u with cardiology Dr KClaiborne Billings  Discharge Diagnoses:  Active Hospital Problems   Diagnosis Date Noted  . Acute renal failure with acute tubular necrosis superimposed on stage 3 chronic kidney disease ([facility]   . Acute on chronic diastolic CHF (congestive heart failure) ([facility]   . Abnormal liver function   . Acute kidney injury ([facility]   . Anemia 01/04/2017  . Acute CHF (congestive heart failure) ([facility] 01/04/2017  . CKD (chronic kidney disease) stage 3, GFR 30-59 ml/min 06/24/2014  . Acute diastolic congestive heart failure ([facility] 10/13/2013  . 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    Discharge Condition: stable  Diet recommendation: heart healthy/carb modified  Filed Weights   01/10/17 0535 01/11/17 0645 01/12/17 0255  Weight: 97.8 kg (215 lb 9.6 oz) 95.5 kg (210 lb 9.6 oz) 93.8 kg (206 lb 11.2 oz)    History of present illness:  Chief Complaint: Feeling poorly  HPI: Alyssa DICKARDis a 71y.o. female with medical history significant for but not limited to type 2 diabetes, hypertension and hyperlipidemia presenting with several days history of "feeling poorly. The last several days patient has been feeling weak with easy fatigability and mild shortness of breath on exertion without fever or chills or cough. Had noted some lower extremity swelling as well. She had also been a little bit confused.  3 days ago she had some abdominal bloating with abdominal pain which was generalized and aching in character with some  diarrhea which was relieved with change in an Imodium, which has since resolved.  Patient is unable to give specific history,which is limited, mainly by her daughter at bedside as noted above.  ED Course: At the ED patient had a BP of 152/84 with heart rate of 87 and O2 saturation of 92% on room air. 12-lead EKG revealed nonspecific ST-T wave changes.CT of the abdomen/pelvis and chest was notable for cardiomegaly with lateral pleural effusion, right more than left, with fluid overload/Anasarca and probable pulmonary hypertension.head CT was negative. Of note patient's hemoglobin had gone from 12.6 on 09/19/2016 to 8.3 today without history of hematochezia or melena -Patient is said to have had a recent MRI which had noted an abnormal finding in her uterus. She is admitted for further care.  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 2-D echo with EF of 50-55%.  -Transitioned to oral Lasix 120 mg twice a day. Good urine output (4.1 L with with down 3 lbs further). - Continue aspirin and Coreg. Added BiDil. ARB discontinued due  to worsened renal function. -cardiology following, input appreciated, she is cleared from cardiac stand point to discharge home, she is to follow with cardiology Dr Claiborne Billings  Acute on chronic kidney disease stage III - Suspecting cardiorenal syndrome, diabetic nephropathy and medication nonadherence also likely contributing.  ? AIN secondary to PPI which is now discontinued. -Creatinine continues to rise (4.37 today, baseline  1.6). Patient however making good urine. Continue current dose of oral Lasix. Hopefully renal function will plateau and improve. -she is cleared to discharge home by nephrology, patient will have repeat renal function at nephrology office next week and continue outpatient follow up with nephrology.  Moderate right pleural effusion -Secondary to CHF.  Chest x-ray on 6/21 shows increasing bilateral pleural effusion.  -s/p "Successful ultrasound guided therapeutic right thoracentesis yielding 925 mL of pleural fluid." by IR -she is on room air, no chest pain, denies sob, no cough at discharge -she  Elevated troponin, mild and flat at 0.06 associated with acute CHF. No chest pain symptoms or EKG changes.  Uncontrolled type 2 diabetes mellitus with hyperglycemia -A1c of 7.4.  -Patient not following with current PCP due to disagreement regarding her plan of care.  -limited oral meds choice due to impaired renal function, home meds glipizide held in the hospital, dose reduced at discharge -blood sugar range from 150-250's most of the time. -patient agreed to start on lantus nightly, she does not want sliding scale insulin.  -pmd to continue monitor blood sugar control   Transaminitis Hepatitis panel negative, Ultrasound abdomen shows gallbladder sludge and CBD dilated measuring up to 9 mm. No Murphy's sign.  Elevated LFTs possibly from liver congestion secondary to chf exacerbation,  lft normalized at discharge, statin held initially, resumed at discharge, pmd to continue monitor lft   Hypothyroidism  -tsh 6 -Synthroid dose increased for elevated TSH. pmd to repeat tsh in 4-6 weeks  Anemia of chronic kidney disease. Colonoscopy in 2015 showed sessile polyps. FOBT negative. CT abdomen and pelvis unremarkable. Iron panel and B12 normal. Received Feraheme and Aranesp.  Hypokalemia/Hypomagnesemia  Replenished.  Chronic depression Seems to have worsened after her husband death  last year. Patient was on Zoloft previously and daughter informs that it did not help. Offered her to start another antidepressant but patient declined. Need to follow with pmd  H/o elevated uric acid, denies h/o gout attacks, allopurinol discontinued due to worsening of cr pmd and nephrology continue to follow.   FTT; she initially declined home health,  She agreed to it at discharge, home health RN for disease management and home health PT for balance and gait.   Code Status : Full code  Family Communication  : Daughter at bedside  Disposition:  home with home health on 6/23   Consults  :   Cardiology Nephrology  Procedures  : 2-D echo CT chest Renal ultrasound  DVT Prophylaxis  :  Heparin  Recent Labs       Lab Results  Component Value Date   PLT 185 01/07/2017      Antibiotics  :       Anti-infectives    None       Discharge Exam: BP (!) 150/66 (BP Location: Left Arm)   Pulse 64   Temp 98.7 F (37.1 C) (Oral)   Resp 18   Ht '5\' 3"'$  (1.6 m)   Wt 93.8 kg (206 lb 11.2 oz)   SpO2 99%   BMI 36.62 kg/m   General: Elderly female not in distress,  continues to have flat affect, does not talk much Cardiovascular: RRR Respiratory: improved exam at discharge, improved air movement, less basilar rales Lower extremity edema presented on admission has resolved.  Discharge Instructions You were cared for by a hospitalist during your hospital stay. If you have any questions about your discharge medications or the care you received while you were in the hospital after you are discharged, you can call the unit and asked to speak with the hospitalist on call if the hospitalist that took care of you is not available. Once you are discharged, your primary care physician will handle any further medical issues. Please note that NO REFILLS for any discharge medications will be authorized once you are discharged, as it is imperative that you return to your  primary care physician (or establish a relationship with a primary care physician if you do not have one) for your aftercare needs so that they can reassess your need for medications and monitor your lab values.  Discharge Instructions    AMB Referral to [facility]    Complete by:  As directed    PLEASE contact [facility] program for tele-monitoring for this patient. Thanks!  Please assign to community nurse for transition of care calls and assess for home visits. Questions please call:   Natividad Brood, RN BSN [facility] Liaison  [phone] business mobile phone Toll free office [phone]   Reason for consult:  New HF   Diagnoses of:   Heart Failure Diabetes Kidney Failure     Expected date of contact:  1-3 days (reserved for hospital discharges)   Diet - low sodium heart healthy    Complete by:  As directed    Carb modified diet   Discharge instructions    Complete by:  As directed    Please weigh yourself daily, please call your doctor if weight goes up more than 3lbs in three days or worsening of edema.   Face-to-face encounter (required for Medicare/Medicaid patients)    Complete by:  As directed    I Hugh Garrow certify that this patient is under my care and that I, or a nurse practitioner or physician's assistant working with me, had a face-to-face encounter that meets the physician face-to-face encounter requirements with this patient on 01/12/2017. The encounter with the patient was in whole, or in part for the following medical condition(s) which is the primary reason for home health care (List medical condition): FTT, RN for chf disease management, monitor weight and edema.   The encounter with the patient was in whole, or in part, for the following medical condition, which is the primary reason for home health care:  FTT   I certify that, based on my findings, the following services are medically necessary home health services:   Nursing Physical  therapy     Reason for Medically Necessary Home Health Services:  Skilled Nursing- Change/Decline in Patient Status   My clinical findings support the need for the above services:  Shortness of breath with activity   Further, I certify that my clinical findings support that this patient is homebound due to:  Shortness of Breath with activity   Home Health    Complete by:  As directed    To provide the following care/treatments:   PT RN     Increase activity slowly    Complete by:  As directed    Walker rolling    Complete by:  As directed  Allergies as of 01/12/2017      Reactions   Ciprofloxacin Hcl Diarrhea   Cymbalta [duloxetine Hcl] Other (See Comments)   Per pt: it did not work   Xanax [alprazolam] Other (See Comments)   Excessive sleepiness   Codeine Other (See Comments)   Per pt: unknown   Lortab [hydrocodone-acetaminophen] Other (See Comments)   Per pt: unknown   Oxycodone Other (See Comments)   Per pt: unknown      Medication List    STOP taking these medications   allopurinol 300 MG tablet Commonly known as:  ZYLOPRIM   gabapentin 800 MG tablet Commonly known as:  NEURONTIN   losartan 100 MG tablet Commonly known as:  COZAAR     TAKE these medications   ACCU-CHEK AVIVA PLUS test strip Generic drug:  glucose blood USE TO TEST BLOOD SUGAR THREE TIMES DAILY   ACCU-CHEK AVIVA PLUS w/Device Kit   aspirin 81 MG tablet Take 81 mg by mouth daily.   atorvastatin 80 MG tablet Commonly known as:  LIPITOR TAKE 1 TABLET(80 MG) BY MOUTH DAILY   carvedilol 3.125 MG tablet Commonly known as:  COREG Take 1 tablet (3.125 mg total) by mouth 2 (two) times daily with a meal.   Fish Oil 1000 MG Caps Take 1,000 mg by mouth every evening.   freestyle lancets Use as instructed   furosemide 40 MG tablet Commonly known as:  LASIX Take 3 tablets (120 mg total) by mouth 2 (two) times daily. What changed:  See the new instructions.   glipiZIDE 5 MG  tablet Commonly known as:  GLUCOTROL Take 0.5 tablets (2.5 mg total) by mouth daily before breakfast. What changed:  medication strength  how much to take  when to take this  reasons to take this   insulin glargine 100 UNIT/ML injection Commonly known as:  LANTUS Inject 0.05 mLs (5 Units total) into the skin at bedtime.   isosorbide-hydrALAZINE 20-37.5 MG tablet Commonly known as:  BIDIL Take 1 tablet by mouth 3 (three) times daily.   latanoprost 0.005 % ophthalmic solution Commonly known as:  XALATAN Place 1 drop into both eyes every evening.   levothyroxine 75 MCG tablet Commonly known as:  SYNTHROID, LEVOTHROID Take 1 tablet (75 mcg total) by mouth daily before breakfast. Start taking on:  01/13/2017 What changed:  medication strength  See the new instructions.   minoxidil 10 MG tablet Commonly known as:  LONITEN TAKE 1/2 TO 1 TABLET BY MOUTH DAILY AS DIRECTED FOR BLOOD PRESSURE What changed:  See the new instructions.   multivitamin tablet Take 1 tablet by mouth daily.   timolol 0.5 % ophthalmic solution Commonly known as:  TIMOPTIC INSTILL 1 DROP IN BOTH EYES TWICE DAILY   VITAMIN B 12 PO Take 1,000 mcg by mouth every other day.   VITAMIN D-3 PO Take 5,000 Units by mouth every evening.      Allergies  Allergen Reactions  . Ciprofloxacin Hcl Diarrhea  . Cymbalta [Duloxetine Hcl] Other (See Comments)    Per pt: it did not work  . Xanax [Alprazolam] Other (See Comments)    Excessive sleepiness  . Codeine Other (See Comments)    Per pt: unknown  . Lortab [Hydrocodone-Acetaminophen] Other (See Comments)    Per pt: unknown  . Oxycodone Other (See Comments)    Per pt: unknown   Follow-up Information    Tisovec, Adelfa Koh, MD Follow up in 1 month(s).   Specialty:  Internal Medicine Why:  hospital  discharge follow up pmd to monitor liver function while on lipitor repeat thyroid function test in 4-6 weeks Contact information: [address] [place] [address] [phone]        Lennette Bihari, MD Follow up in 3 week(s).   Specialty:  Cardiology Why:  for congestive heart failure Contact information: [address] [place] [address] [phone]        Arita Miss, MD Follow up in 1 month(s).   Specialty:  Nephrology Why:  nephrology clinic will arrange lab work to be done next week. Contact information: [address] [place] [address] [phone]        [facility] Follow up.   Specialty:  Home Health Services Why:  home health nurse and physical therapy Contact information: [address] [place] [address] [place] [address] [phone]            The results of significant diagnostics from this hospitalization (including imaging, microbiology, ancillary and laboratory) are listed below for reference.    Significant Diagnostic Studies: Ct Abdomen Pelvis Wo Contrast  Result Date: 01/04/2017 CLINICAL DATA:  Dizziness.  Abdominal pain with nausea and vomiting. EXAM: CT CHEST, ABDOMEN AND PELVIS WITHOUT CONTRAST TECHNIQUE: Multidetector CT imaging of the chest, abdomen and pelvis was performed following the standard protocol without IV contrast. COMPARISON:  None. FINDINGS: CT CHEST FINDINGS Cardiovascular: Cardiomegaly with small volume pericardial fluid. Enlarged main pulmonary artery at 4 cm, usually seen with pulmonary hypertension. Diffuse atherosclerotic calcification of the coronaries. Multifocal aortic and great vessel calcification. No acute vascular finding. Mediastinum/Nodes: Hilar enlargement it appears vascular. No adenopathy noted. Negative esophagus. Lungs/Pleura: Moderate layering pleural effusions, greater on the right. Multi segment atelectasis. The aerated lung shows no edema. No pneumothorax. Musculoskeletal: See below CT ABDOMEN PELVIS FINDINGS Hepatobiliary: No focal liver abnormality.Subtle layering calculi. No inflammatory changes in the biliary  tree. Normal common bile duct diameter. Pancreas: Unremarkable. Spleen: Unremarkable. Adrenals/Urinary Tract: Negative adrenals. No hydronephrosis or stone. Hilar calcifications appear atherosclerotic. Unremarkable bladder. Stomach/Bowel: No obstruction. No appendicitis. Extensive sigmoid diverticulosis. Vascular/Lymphatic: Diffuse atherosclerotic calcification. No noted adenopathy. No mass or adenopathy. Reproductive:Fibroid uterus with 4 cm calcified/ hyalinized fibroid. Other: No ascites or pneumoperitoneum.  Anasarca. Musculoskeletal: No acute finding. Severe lumbar facet arthropathy. Status post L4-5 fusion. L5-S1 ankylosis or fusion. Extensive thoracic spondylosis with multi-level ankylosis. Advanced glenohumeral osteoarthritis. Due to patient size, some of the abdominal wall is not visible. IMPRESSION: 1. Cardiomegaly with volume overload. Moderate right and small left pleural effusions with multi segment atelectasis. Extensive anasarca. 2. Probable pulmonary hypertension. 3. Extensive colonic diverticulosis. 4. Cholelithiasis. Electronically Signed   By: Marnee Spring M.D.   On: 01/04/2017 20:04   Dg Chest 1 View  Result Date: 01/10/2017 CLINICAL DATA:  Status post thoracentesis. EXAM: CHEST 1 VIEW COMPARISON:  Chest radiograph January 10, 2017 at 0938 hours FINDINGS: Similar blunting of LEFT costophrenic angle, RIGHT hemidiaphragm is no longer elevated, no RIGHT pleural effusion. Moderate cardiomegaly. Calcified aortic knob. Mild pulmonary vascular congestion and mild interstitial prominence. RIGHT lung atelectasis. No pneumothorax. Soft tissue planes and included osseous structures are nonsuspicious. Severe glenohumeral osteoarthrosis. IMPRESSION: Small LEFT pleural effusion versus pleural thickening, resolved RIGHT pleural effusion by AP radiograph. No pneumothorax. Stable cardiomegaly and interstitial prominence most compatible pulmonary edema. Electronically Signed   By: Awilda Metro M.D.   On:  01/10/2017 16:13   Dg Chest 2 View  Result Date: 01/10/2017 CLINICAL DATA:  CHF, right lower lobe atelectasis or  pneumonia, possible pleural effusion. EXAM: CHEST  2 VIEW COMPARISON:  PA and lateral chest x-ray of January 07, 2017 and chest CT scan of January 04, 2017. FINDINGS: The cardiac silhouette remains enlarged. There small bilateral pleural effusions greatest on the right. Density at the right lung base persists. The pulmonary vascularity is mildly engorged. IMPRESSION: CHF with right basilar atelectasis or pneumonia. Bilateral pleural effusions which have increased slightly in volume since the previous study. Electronically Signed   By: David  Martinique M.D.   On: 01/10/2017 09:56   Dg Chest 2 View  Result Date: 01/07/2017 CLINICAL DATA:  Chronic diastolic congestive heart failure. EXAM: CHEST  2 VIEW COMPARISON:  CT chest 01/04/2017 and chest radiograph 01/11/2015. FINDINGS: Trachea is midline. Heart is enlarged. Basilar dependent mixed interstitial and airspace opacification with consolidation in the right lower lobe. Small bilateral pleural effusions. Degenerative changes in shoulders. IMPRESSION: 1. Congestive heart failure. 2. Consolidation in the right lower lobe. Pneumonia cannot be excluded. Electronically Signed   By: Lorin Picket M.D.   On: 01/07/2017 07:38   Ct Head Wo Contrast  Result Date: 01/04/2017 CLINICAL DATA:  Dizziness and weakness. EXAM: CT HEAD WITHOUT CONTRAST TECHNIQUE: Contiguous axial images were obtained from the base of the skull through the vertex without intravenous contrast. COMPARISON:  10/12/2013 head CT, MRI 01/13/2014 FINDINGS: Brain: Chronic moderate superficial atrophy with small vessel ischemic disease of periventricular and subcortical white matter. No acute intracranial hemorrhage, midline shift or edema. No extra-axial fluid collections. The basal cisterns and fourth ventricle are midline. Vascular: Atherosclerotic calcifications of the vertebral arteries and  cavernous sinus carotids. No hyperdense vessels. Skull: Negative for fracture or focal lesions. Sinuses/Orbits: No acute finding. Other: None. IMPRESSION: Chronic moderate superficial atrophy with small vessel ischemic disease. No acute intracranial abnormality. Electronically Signed   By: Ashley Royalty M.D.   On: 01/04/2017 19:47   Ct Chest Wo Contrast  Result Date: 01/04/2017 CLINICAL DATA:  Dizziness.  Abdominal pain with nausea and vomiting. EXAM: CT CHEST, ABDOMEN AND PELVIS WITHOUT CONTRAST TECHNIQUE: Multidetector CT imaging of the chest, abdomen and pelvis was performed following the standard protocol without IV contrast. COMPARISON:  None. FINDINGS: CT CHEST FINDINGS Cardiovascular: Cardiomegaly with small volume pericardial fluid. Enlarged main pulmonary artery at 4 cm, usually seen with pulmonary hypertension. Diffuse atherosclerotic calcification of the coronaries. Multifocal aortic and great vessel calcification. No acute vascular finding. Mediastinum/Nodes: Hilar enlargement it appears vascular. No adenopathy noted. Negative esophagus. Lungs/Pleura: Moderate layering pleural effusions, greater on the right. Multi segment atelectasis. The aerated lung shows no edema. No pneumothorax. Musculoskeletal: See below CT ABDOMEN PELVIS FINDINGS Hepatobiliary: No focal liver abnormality.Subtle layering calculi. No inflammatory changes in the biliary tree. Normal common bile duct diameter. Pancreas: Unremarkable. Spleen: Unremarkable. Adrenals/Urinary Tract: Negative adrenals. No hydronephrosis or stone. Hilar calcifications appear atherosclerotic. Unremarkable bladder. Stomach/Bowel: No obstruction. No appendicitis. Extensive sigmoid diverticulosis. Vascular/Lymphatic: Diffuse atherosclerotic calcification. No noted adenopathy. No mass or adenopathy. Reproductive:Fibroid uterus with 4 cm calcified/ hyalinized fibroid. Other: No ascites or pneumoperitoneum.  Anasarca. Musculoskeletal: No acute finding. Severe  lumbar facet arthropathy. Status post L4-5 fusion. L5-S1 ankylosis or fusion. Extensive thoracic spondylosis with multi-level ankylosis. Advanced glenohumeral osteoarthritis. Due to patient size, some of the abdominal wall is not visible. IMPRESSION: 1. Cardiomegaly with volume overload. Moderate right and small left pleural effusions with multi segment atelectasis. Extensive anasarca. 2. Probable pulmonary hypertension. 3. Extensive colonic diverticulosis. 4. Cholelithiasis. Electronically Signed   By: Monte Fantasia M.D.   On: 01/04/2017  20:04   US Abdomen Limited Ruq  Result Date: 01/05/2017 CLINICAL DATA:  Abnormal liver functions EXAM: ULTRASOUND ABDOMEN LIMITED RIGHT UPPER QUADRANT COMPARISON:  CT scan October 05, 2014 FINDINGS: Gallbladder: Sludge is seen in the gallbladder with no shadowing to definitively suggest stones. No Murphy's sign or pericholecystic fluid. The gallbladder wall measures up to 3.5 mm. Common bile duct: Diameter: 9 mm Liver: An echogenic mass in the liver measures up to 16 mm. No definitive correlate on the October 05, 2014 CT scan. Incidentally, a right pleural effusion is identified. IMPRESSION: 1. Significant sludge in the gallbladder. No definitive stones are visualized today but suspected small stones were seen on a CT scan in 2016. The gallbladder wall is mildly thickened. No Murphy's sign or pericholecystic fluid identified. 2. The common bile duct is dilated measuring up to 9 mm. Consider an MRCP to evaluate for choledocholithiasis. Electronically Signed   By: Dorise Bullion III M.D   On: 01/05/2017 18:58   Ir Thoracentesis Asp Pleural Space W/img Guide  Result Date: 01/11/2017 INDICATION: Patient with history of CHF and right pleural effusion. Request is made for right thoracentesis. EXAM: ULTRASOUND GUIDED THERAPEUTIC RIGHT THORACENTESIS MEDICATIONS: 10 mL 1% lidocaine COMPLICATIONS: None immediate. PROCEDURE: An ultrasound guided thoracentesis was thoroughly discussed  with the patient and questions answered. The benefits, risks, alternatives and complications were also discussed. The patient understands and wishes to proceed with the procedure. Written consent was obtained. Ultrasound was performed to localize and mark an adequate pocket of fluid in the right chest. The area was then prepped and draped in the normal sterile fashion. 1% Lidocaine was used for local anesthesia. Under ultrasound guidance a Safe-T-centesis catheter was introduced. Thoracentesis was performed. The catheter was removed and a dressing applied. FINDINGS: A total of approximately 925 mL of clear, yellow fluid was removed. IMPRESSION: Successful ultrasound guided therapeutic right thoracentesis yielding 925 mL of pleural fluid. Read by:  Brynda Greathouse PA-C Electronically Signed   By: Marybelle Killings M.D.   On: 01/10/2017 17:03    Microbiology: No results found for this or any previous visit (from the past 240 hour(s)).   Labs: Basic Metabolic Panel:  Recent Labs Lab 01/08/17 0735 01/09/17 0624 01/10/17 0329 01/11/17 0510 01/12/17 0355  NA 143 141 140 141 140  K 4.3 4.0 3.8 3.6 3.2*  CL 111 107 105 104 101  CO2 21* '26 26 27 30  '$ GLUCOSE 138* 126* 129* 128* 159*  BUN 69* 72* 73* 69* 68*  CREATININE 4.21* 4.29* 4.37* 4.14* 4.28*  CALCIUM 9.3 9.8 9.5 9.4 9.3   Liver Function Tests:  Recent Labs Lab 01/06/17 0944 01/07/17 0540 01/08/17 0735 01/12/17 0355  AST 39 '25 21 16  '$ ALT 115* 89* 73* 35  ALKPHOS 73 63 62 60  BILITOT 0.6 0.7 0.3 0.5  PROT 6.0* 5.5* 5.5* 5.3*  ALBUMIN 3.3* 3.3* 3.1* 3.1*   No results for input(s): LIPASE, AMYLASE in the last 168 hours.  Recent Labs Lab 01/05/17 2056  AMMONIA 29   CBC:  Recent Labs Lab 01/06/17 0944 01/07/17 0540 01/12/17 0355  WBC 8.4 7.0 5.7  HGB 8.5* 7.9* 8.4*  HCT 27.7* 25.7* 27.0*  MCV 95.8 93.1 93.4  PLT 196 185 196   Cardiac Enzymes: No results for input(s): CKTOTAL, CKMB, CKMBINDEX, TROPONINI in the last 168  hours. BNP: BNP (last 3 results)  Recent Labs  01/04/17 1744 01/05/17 0357  BNP 899.1* 1,317.6*    ProBNP (last 3 results) No results for  input(s): PROBNP in the last 8760 hours.  CBG:  Recent Labs Lab 01/11/17 1636 01/11/17 2118 01/11/17 2203 01/12/17 0725 01/12/17 1135  GLUCAP 255* 262* 239* 152* 254*       Signed:  Nishika Parkhurst MD, PhD  [facility] 01/12/2017, 12:05 PM

## 2017-01-14 ENCOUNTER — Other Ambulatory Visit: Payer: Self-pay

## 2017-01-14 NOTE — Patient Outreach (Signed)
[facility]) Care Management  01/14/17  Loriann Bosserman Macnaughton Dec 31, 1945 [id number]  Initial attempt to reach patient for transition of care without success. Left HIPAA compliant voicemail with RNCM contact information and invited callback.  [place] R. Lenzie Sandler, RN, BSN, [place] Management Coordinator [phone]

## 2017-01-16 DIAGNOSIS — N183 Chronic kidney disease, stage 3 (moderate): Secondary | ICD-10-CM | POA: Diagnosis not present

## 2017-01-19 ENCOUNTER — Other Ambulatory Visit: Payer: Self-pay

## 2017-01-19 NOTE — Patient Outreach (Signed)
[place] [facility]) Care Management  01/19/17  TAKEIRA YANES [birth year] [id number]  Second attempt to reach patient without success. Patient's phone number is daughter's cell number per [facility] Consent on file.  Left HIPAA compliant voicemail for patient's daughter with request to callback. RNCM contact information provided.  [place] R. Rosario Kushner, RN, BSN, [place] Management Coordinator [phone]

## 2017-01-21 ENCOUNTER — Other Ambulatory Visit: Payer: Self-pay

## 2017-01-21 NOTE — Patient Outreach (Signed)
[facility] [facility]) Care Management  01/21/17  KEILEY LEVEY 09-26-1945 [id number]   Successful outreach completed with patient's daughter, Rachel Moulds. Patient information verified.  Per Amy, they currently have a lot going on and her mother is reluctant to allow anyone to come into the home. They have a nurse coming out this week and she is not sure how that will go. She currently wants to hold off on [place] Management services until the end of the week to see how the visit with the home health nurse goes. She will contact RNCM with update and to start services. RNCM advised can assist telephonically if they do not want home visits at this time and she was appreciative, but will let RNCM know more after this week.  RNCM contact information provided and encouraged to call with any needs or concerns.   Plan: Patient to have home health visit this week and her daughter, Amy will contact RNCM at end of week to discuss if they wish to have [facility] services at that time.  [place] R. Marita Burnsed, RN, BSN, [place] Management Coordinator [phone]

## 2017-01-22 DIAGNOSIS — D631 Anemia in chronic kidney disease: Secondary | ICD-10-CM | POA: Diagnosis not present

## 2017-01-22 DIAGNOSIS — N183 Chronic kidney disease, stage 3 (moderate): Secondary | ICD-10-CM | POA: Diagnosis not present

## 2017-01-24 ENCOUNTER — Encounter: Payer: Self-pay | Admitting: Cardiology

## 2017-01-24 ENCOUNTER — Ambulatory Visit (INDEPENDENT_AMBULATORY_CARE_PROVIDER_SITE_OTHER): Payer: Medicare Other | Admitting: Cardiology

## 2017-01-24 DIAGNOSIS — F319 Bipolar disorder, unspecified: Secondary | ICD-10-CM

## 2017-01-24 DIAGNOSIS — I1310 Hypertensive heart and chronic kidney disease without heart failure, with stage 1 through stage 4 chronic kidney disease, or unspecified chronic kidney disease: Secondary | ICD-10-CM | POA: Diagnosis not present

## 2017-01-24 DIAGNOSIS — J90 Pleural effusion, not elsewhere classified: Secondary | ICD-10-CM | POA: Diagnosis not present

## 2017-01-24 DIAGNOSIS — I5031 Acute diastolic (congestive) heart failure: Secondary | ICD-10-CM

## 2017-01-24 NOTE — Patient Instructions (Signed)
Medication Instructions: HOLD Atorvastatin (Lipitor) 80 mg until further notice  Follow-Up: Your physician recommends that you schedule a follow-up appointment in: 6 weeks in Dr. Claiborne Billings.   If you need a refill on your cardiac medications before your next appointment, please call your pharmacy.

## 2017-01-24 NOTE — Assessment & Plan Note (Signed)
Admitted 01/04/17 with acute diastolic CHF and AKI. Previously admitted in 5726 with diastolic CHF requiring thoracentesis.

## 2017-01-24 NOTE — Assessment & Plan Note (Signed)
Echo 01/05/17 showed severe LVH with grade 1 DD and EF of 50-55%

## 2017-01-24 NOTE — Progress Notes (Signed)
01/24/2017 Alyssa [name]   1945-10-05  [id number]  Primary Physician Patient, No Pcp Per Primary Cardiologist: Dr Claiborne Billings  HPI:  71 y/o female seen by [name] once in 2015 after she presented with CHF and Rt pleural effusion. She was admitted 10/12/13 with acute respiratory failure requiring intubation. This was presumed to be from acute diastolic CHF. Echo showed an EF of 60-65% with grade 1 diastolic dysfunction and PA pressure of 56 mmHg. She has diuresed  and had a Rt pleural effusion that was tapped- 1100cc.  Myoview then was low risk. Dr Claiborne Billings felt she probably had sleep apnea as well. We saw her once in follow up but then no further contact.  She was admitted 01/04/17 with CHF and acute on chronic kidney insuficiency. Her wgt on admission was 222 lbs. An echo done 01/05/17 showed her EF to be 50-55% with severe LVH. She was diuresed and her SCr was followed closely. Her Rt pleural effusion persisted and this was tapped on 01/10/17 (924m). She is in the office now for follow up. Her daughter accompanied her. The pt is unable or unwilling to discuss her medical care from 2015-present. Her daughter did not contribute. The pt's main complaint is anorexia. "I juste feel bad all over". She generalized fatigue, trouble sleeping, and no food tastes good to her. "I just can't handle all this". She is not SOB.    Current Outpatient Prescriptions  Medication Sig Dispense Refill  . ACCU-CHEK AVIVA PLUS test strip USE TO TEST BLOOD SUGAR THREE TIMES DAILY 100 each 3  . aspirin 81 MG tablet Take 81 mg by mouth daily.    .Marland Kitchenatorvastatin (LIPITOR) 80 MG tablet TAKE 1 TABLET(80 MG) BY MOUTH DAILY 90 tablet 0  . Blood Glucose Monitoring Suppl (ACCU-CHEK AVIVA PLUS) W/DEVICE KIT   0  . carvedilol (COREG) 3.125 MG tablet Take 1 tablet (3.125 mg total) by mouth 2 (two) times daily with a meal. 60 tablet 0  . Cholecalciferol (VITAMIN D-3 PO) Take 5,000 Units by mouth every evening.     . Cyanocobalamin (VITAMIN B 12  PO) Take 1,000 mcg by mouth every other day.    . furosemide (LASIX) 40 MG tablet Take 3 tablets (120 mg total) by mouth 2 (two) times daily. 180 tablet 0  . glipiZIDE (GLUCOTROL) 5 MG tablet Take 0.5 tablets (2.5 mg total) by mouth daily before breakfast. 30 tablet 0  . insulin glargine (LANTUS) 100 UNIT/ML injection Inject 0.05 mLs (5 Units total) into the skin at bedtime. 10 mL 0  . isosorbide-hydrALAZINE (BIDIL) 20-37.5 MG tablet Take 1 tablet by mouth 3 (three) times daily. 90 tablet 0  . Lancets (FREESTYLE) lancets Use as instructed 100 each 12  . latanoprost (XALATAN) 0.005 % ophthalmic solution Place 1 drop into both eyes every evening.     .Marland Kitchenlevothyroxine (SYNTHROID, LEVOTHROID) 75 MCG tablet Take 1 tablet (75 mcg total) by mouth daily before breakfast. 30 tablet 0  . minoxidil (LONITEN) 10 MG tablet TAKE 1/2 TO 1 TABLET BY MOUTH DAILY AS DIRECTED FOR BLOOD PRESSURE (Patient taking differently: TAKE 1 TABLET BY MOUTH DAILY AS DIRECTED FOR BLOOD PRESSURE) 90 tablet 0  . Multiple Vitamin (MULTIVITAMIN) tablet Take 1 tablet by mouth daily.    . Omega-3 Fatty Acids (FISH OIL) 1000 MG CAPS Take 1,000 mg by mouth every evening.     . timolol (TIMOPTIC) 0.5 % ophthalmic solution INSTILL 1 DROP IN BOTH EYES TWICE DAILY 5 mL 0  No current facility-administered medications for this visit.     Allergies  Allergen Reactions  . Ciprofloxacin Hcl Diarrhea  . Cymbalta [Duloxetine Hcl] Other (See Comments)    Per pt: it did not work  . Xanax [Alprazolam] Other (See Comments)    Excessive sleepiness  . Codeine Other (See Comments)    Per pt: unknown  . Lortab [Hydrocodone-Acetaminophen] Other (See Comments)    Per pt: unknown  . Oxycodone Other (See Comments)    Per pt: unknown    Past Medical History:  Diagnosis Date  . Anemia   . Bipolar 1 disorder ([place])   . Cataract   . Chronic back pain   . Diabetes mellitus without complication ([place])   . Glaucoma   . Hyperlipidemia   .  Hypertension   . Hypothyroidism   . Osteoporosis     Social History   Social History  . Marital status: Married    Spouse name: N/A  . Number of children: N/A  . Years of education: N/A   Occupational History  .  Retired   Social History Main Topics  . Smoking status: Never Smoker  . Smokeless tobacco: Never Used  . Alcohol use Yes     Comment: social drinker- glass of wine or cocktail  . Drug use: No  . Sexual activity: No   Other Topics Concern  . Not on file   Social History Narrative  . No narrative on file     Family History  Problem Relation Age of Onset  . Adopted: Yes     Review of Systems: General: negative for chills, fever, night sweats or weight changes.  Cardiovascular: negative for chest pain, dyspnea on exertion, edema, orthopnea, palpitations, paroxysmal nocturnal dyspnea or shortness of breath Dermatological: negative for rash Respiratory: negative for cough or wheezing Urologic: negative for hematuria Abdominal: negative for nausea, vomiting, diarrhea, bright red blood per rectum, melena, or hematemesis Neurologic: negative for visual changes, syncope, or dizziness All other systems reviewed and are otherwise negative except as noted above.    Blood pressure 130/72, pulse 72, height _0  (1.6 m), weight 186 lb (84.4 kg), SpO2 99 %.  General appearance: alert, cooperative, appears older than stated age, no distress and mildly obese Neck: no carotid bruit and no JVD Lungs: clear to auscultation bilaterally Heart: regular rate and rhythm Extremities: no edema Skin: plae cool dry Neurologic: Grossly normal   ASSESSMENT AND PLAN:   Acute diastolic congestive heart failure ([place]) Admitted 01/04/17 with acute diastolic CHF and AKI. Previously admitted in 8115 with diastolic CHF requiring thoracentesis.  Hypertensive cardiovascular-renal disease Echo 01/05/17 showed severe LVH with grade 1 DD and EF of 50-55%  Bipolar 1 disorder Possible  medication non compliance as an OP  Pleural effusion on right Recurrent Rt pleural effusion secondary to CHF requiring thoracentesis 2015, and June 2018   PLAN  I'm concerned the pt is uremic. She is to see Dr Claybon Jabs tomorrow. From a cardiac standpoint she is stable- no Rt effusion on exam. I suggested she stop her Lipitor to see if that makes any difference. We discussed the possibility of dialysis. F/U in 6 weeks.   Kerin Ransom PA-C 01/24/2017 11:48 AM

## 2017-01-24 NOTE — Assessment & Plan Note (Addendum)
Possible medication non compliance as an OP

## 2017-01-24 NOTE — Assessment & Plan Note (Signed)
Recurrent Rt pleural effusion secondary to CHF requiring thoracentesis 2015, and June 2018

## 2017-01-25 DIAGNOSIS — I5030 Unspecified diastolic (congestive) heart failure: Secondary | ICD-10-CM | POA: Diagnosis not present

## 2017-01-25 DIAGNOSIS — N183 Chronic kidney disease, stage 3 (moderate): Secondary | ICD-10-CM | POA: Diagnosis not present

## 2017-01-25 DIAGNOSIS — I10 Essential (primary) hypertension: Secondary | ICD-10-CM | POA: Diagnosis not present

## 2017-01-29 ENCOUNTER — Other Ambulatory Visit: Payer: Self-pay

## 2017-01-29 NOTE — Patient Outreach (Signed)
[name]) Care Management  01/29/17  Alyssa [name] 01/01/1946 [id number]  RNCM has not received any response from patient's daughter since last conversation last week to discuss whether or not patient will allow RNCM to complete home visit or follow up with her.   Attempted to reach patient's daughter, Rachel Moulds without success. Left HIPAA compliant voicemail with RNCM contact information and requested callback.  [place] R. Jayesh Marbach, RN, BSN, [place] Management Coordinator [phone]

## 2017-02-01 ENCOUNTER — Observation Stay (HOSPITAL_COMMUNITY)
Admission: EM | Admit: 2017-02-01 | Discharge: 2017-02-02 | Disposition: A | Discharge status: Home / Self Care | Payer: Medicare Other | Attending: Internal Medicine | Admitting: Internal Medicine

## 2017-02-01 ENCOUNTER — Emergency Department (HOSPITAL_COMMUNITY): Payer: Medicare Other

## 2017-02-01 ENCOUNTER — Encounter (HOSPITAL_COMMUNITY): Payer: Self-pay

## 2017-02-01 DIAGNOSIS — I5033 Acute on chronic diastolic (congestive) heart failure: Secondary | ICD-10-CM | POA: Insufficient documentation

## 2017-02-01 DIAGNOSIS — M810 Age-related osteoporosis without current pathological fracture: Secondary | ICD-10-CM | POA: Diagnosis not present

## 2017-02-01 DIAGNOSIS — Z7982 Long term (current) use of aspirin: Secondary | ICD-10-CM | POA: Insufficient documentation

## 2017-02-01 DIAGNOSIS — N183 Chronic kidney disease, stage 3 (moderate): Secondary | ICD-10-CM | POA: Insufficient documentation

## 2017-02-01 DIAGNOSIS — R69 Illness, unspecified: Secondary | ICD-10-CM

## 2017-02-01 DIAGNOSIS — R55 Syncope and collapse: Secondary | ICD-10-CM | POA: Diagnosis not present

## 2017-02-01 DIAGNOSIS — D631 Anemia in chronic kidney disease: Secondary | ICD-10-CM | POA: Insufficient documentation

## 2017-02-01 DIAGNOSIS — G319 Degenerative disease of nervous system, unspecified: Secondary | ICD-10-CM | POA: Diagnosis not present

## 2017-02-01 DIAGNOSIS — I130 Hypertensive heart and chronic kidney disease with heart failure and stage 1 through stage 4 chronic kidney disease, or unspecified chronic kidney disease: Secondary | ICD-10-CM | POA: Insufficient documentation

## 2017-02-01 DIAGNOSIS — E1129 Type 2 diabetes mellitus with other diabetic kidney complication: Secondary | ICD-10-CM | POA: Diagnosis present

## 2017-02-01 DIAGNOSIS — R404 Transient alteration of awareness: Secondary | ICD-10-CM | POA: Diagnosis not present

## 2017-02-01 DIAGNOSIS — D259 Leiomyoma of uterus, unspecified: Secondary | ICD-10-CM | POA: Diagnosis not present

## 2017-02-01 DIAGNOSIS — H169 Unspecified keratitis: Secondary | ICD-10-CM | POA: Diagnosis not present

## 2017-02-01 DIAGNOSIS — Z79899 Other long term (current) drug therapy: Secondary | ICD-10-CM | POA: Insufficient documentation

## 2017-02-01 DIAGNOSIS — I10 Essential (primary) hypertension: Secondary | ICD-10-CM

## 2017-02-01 DIAGNOSIS — I272 Pulmonary hypertension, unspecified: Secondary | ICD-10-CM | POA: Insufficient documentation

## 2017-02-01 DIAGNOSIS — F319 Bipolar disorder, unspecified: Secondary | ICD-10-CM | POA: Diagnosis not present

## 2017-02-01 DIAGNOSIS — G8929 Other chronic pain: Secondary | ICD-10-CM | POA: Insufficient documentation

## 2017-02-01 DIAGNOSIS — Z6833 Body mass index (BMI) 33.0-33.9, adult: Secondary | ICD-10-CM | POA: Diagnosis not present

## 2017-02-01 DIAGNOSIS — E1151 Type 2 diabetes mellitus with diabetic peripheral angiopathy without gangrene: Secondary | ICD-10-CM | POA: Insufficient documentation

## 2017-02-01 DIAGNOSIS — E559 Vitamin D deficiency, unspecified: Secondary | ICD-10-CM | POA: Insufficient documentation

## 2017-02-01 DIAGNOSIS — E1122 Type 2 diabetes mellitus with diabetic chronic kidney disease: Secondary | ICD-10-CM | POA: Insufficient documentation

## 2017-02-01 DIAGNOSIS — Z981 Arthrodesis status: Secondary | ICD-10-CM | POA: Diagnosis not present

## 2017-02-01 DIAGNOSIS — E039 Hypothyroidism, unspecified: Secondary | ICD-10-CM | POA: Insufficient documentation

## 2017-02-01 DIAGNOSIS — H409 Unspecified glaucoma: Secondary | ICD-10-CM | POA: Diagnosis not present

## 2017-02-01 DIAGNOSIS — I120 Hypertensive chronic kidney disease with stage 5 chronic kidney disease or end stage renal disease: Secondary | ICD-10-CM | POA: Diagnosis not present

## 2017-02-01 DIAGNOSIS — I700 Atherosclerosis of aorta: Secondary | ICD-10-CM | POA: Diagnosis not present

## 2017-02-01 DIAGNOSIS — M109 Gout, unspecified: Secondary | ICD-10-CM | POA: Diagnosis not present

## 2017-02-01 DIAGNOSIS — N170 Acute kidney failure with tubular necrosis: Secondary | ICD-10-CM | POA: Diagnosis not present

## 2017-02-01 DIAGNOSIS — E785 Hyperlipidemia, unspecified: Secondary | ICD-10-CM | POA: Insufficient documentation

## 2017-02-01 DIAGNOSIS — K8020 Calculus of gallbladder without cholecystitis without obstruction: Secondary | ICD-10-CM | POA: Insufficient documentation

## 2017-02-01 DIAGNOSIS — N185 Chronic kidney disease, stage 5: Secondary | ICD-10-CM

## 2017-02-01 DIAGNOSIS — D649 Anemia, unspecified: Secondary | ICD-10-CM | POA: Diagnosis present

## 2017-02-01 DIAGNOSIS — Z794 Long term (current) use of insulin: Secondary | ICD-10-CM | POA: Insufficient documentation

## 2017-02-01 DIAGNOSIS — F324 Major depressive disorder, single episode, in partial remission: Secondary | ICD-10-CM | POA: Diagnosis present

## 2017-02-01 LAB — BASIC METABOLIC PANEL
ANION GAP: 14 (ref 5–15)
ANION GAP: 14 (ref 5–15)
BUN: 83 mg/dL — ABNORMAL HIGH (ref 6–20)
BUN: 83 mg/dL — ABNORMAL HIGH (ref 6–20)
CO2: 28 mmol/L (ref 22–32)
CO2: 25 mmol/L (ref 22–32)
Calcium: 10.4 mg/dL — ABNORMAL HIGH (ref 8.9–10.3)
Calcium: 9.6 mg/dL (ref 8.9–10.3)
Chloride: 94 mmol/L — ABNORMAL LOW (ref 101–111)
Chloride: 94 mmol/L — ABNORMAL LOW (ref 101–111)
Creatinine, Ser: 4.61 mg/dL — ABNORMAL HIGH (ref 0.44–1.00)
Creatinine, Ser: 4.58 mg/dL — ABNORMAL HIGH (ref 0.44–1.00)
GFR calc Af Amer: 10 mL/min — ABNORMAL LOW (ref >60)
GFR, EST AFRICAN AMERICAN: 10 mL/min — AB (ref >60)
GFR, EST NON AFRICAN AMERICAN: 9 mL/min — AB (ref >60)
GFR, EST NON AFRICAN AMERICAN: 9 mL/min — AB (ref >60)
GLUCOSE: 119 mg/dL — AB (ref 65–99)
GLUCOSE: 145 mg/dL — AB (ref 65–99)
POTASSIUM: 3.3 mmol/L — AB (ref 3.5–5.1)
POTASSIUM: 3.8 mmol/L (ref 3.5–5.1)
Sodium: 136 mmol/L (ref 135–145)
Sodium: 133 mmol/L — ABNORMAL LOW (ref 135–145)

## 2017-02-01 LAB — ETHANOL

## 2017-02-01 LAB — GLUCOSE, CAPILLARY
Glucose-Capillary: 118 mg/dL — ABNORMAL HIGH (ref 65–99)
Glucose-Capillary: 163 mg/dL — ABNORMAL HIGH (ref 65–99)

## 2017-02-01 LAB — RAPID URINE DRUG SCREEN, HOSP PERFORMED
Amphetamines: NOT DETECTED
Barbiturates: NOT DETECTED
Benzodiazepines: NOT DETECTED
COCAINE: NOT DETECTED
OPIATES: NOT DETECTED
Tetrahydrocannabinol: NOT DETECTED

## 2017-02-01 LAB — CBC WITH DIFFERENTIAL/PLATELET
BASOS ABS: 0 10*3/uL (ref 0.0–0.1)
BASOS PCT: 0 %
Eosinophils Absolute: 0.2 10*3/uL (ref 0.0–0.7)
Eosinophils Relative: 2 %
HEMATOCRIT: 32.2 % — AB (ref 36.0–46.0)
Hemoglobin: 9.9 g/dL — ABNORMAL LOW (ref 12.0–15.0)
LYMPHS PCT: 19 %
Lymphs Abs: 1.4 10*3/uL (ref 0.7–4.0)
MCH: 28.4 pg (ref 26.0–34.0)
MCHC: 30.7 g/dL (ref 30.0–36.0)
MCV: 92.5 fL (ref 78.0–100.0)
Monocytes Absolute: 0.2 10*3/uL (ref 0.1–1.0)
Monocytes Relative: 3 %
NEUTROS ABS: 5.5 10*3/uL (ref 1.7–7.7)
NEUTROS PCT: 76 %
PLATELETS: 306 10*3/uL (ref 150–400)
RBC: 3.48 MIL/uL — ABNORMAL LOW (ref 3.87–5.11)
RDW: 14.1 % (ref 11.5–15.5)
WBC: 7.3 10*3/uL (ref 4.0–10.5)

## 2017-02-01 LAB — POC OCCULT BLOOD, ED: Fecal Occult Bld: NEGATIVE

## 2017-02-01 LAB — URINALYSIS, ROUTINE W REFLEX MICROSCOPIC
Bilirubin Urine: NEGATIVE
Glucose, UA: NEGATIVE mg/dL
HGB URINE DIPSTICK: NEGATIVE
KETONES UR: NEGATIVE mg/dL
Leukocytes, UA: NEGATIVE
Nitrite: NEGATIVE
Protein, ur: 30 mg/dL — AB
Specific Gravity, Urine: 1.012 (ref 1.005–1.030)
pH: 5 (ref 5.0–8.0)

## 2017-02-01 LAB — COMPREHENSIVE METABOLIC PANEL
ALBUMIN: 3.9 g/dL (ref 3.5–5.0)
ALT: 15 U/L (ref 14–54)
ANION GAP: 14 (ref 5–15)
AST: 21 U/L (ref 15–41)
Alkaline Phosphatase: 53 U/L (ref 38–126)
BILIRUBIN TOTAL: 0.4 mg/dL (ref 0.3–1.2)
BUN: 85 mg/dL — AB (ref 6–20)
CHLORIDE: 93 mmol/L — AB (ref 101–111)
CO2: 25 mmol/L (ref 22–32)
Calcium: 10.2 mg/dL (ref 8.9–10.3)
Creatinine, Ser: 4.96 mg/dL — ABNORMAL HIGH (ref 0.44–1.00)
GFR calc Af Amer: 9 mL/min — ABNORMAL LOW (ref >60)
GFR, EST NON AFRICAN AMERICAN: 8 mL/min — AB (ref >60)
GLUCOSE: 195 mg/dL — AB (ref 65–99)
Potassium: 3.2 mmol/L — ABNORMAL LOW (ref 3.5–5.1)
Sodium: 132 mmol/L — ABNORMAL LOW (ref 135–145)
TOTAL PROTEIN: 6.8 g/dL (ref 6.5–8.1)

## 2017-02-01 LAB — PROTIME-INR
INR: 0.95
PROTHROMBIN TIME: 12.7 s (ref 11.4–15.2)

## 2017-02-01 LAB — I-STAT TROPONIN, ED: Troponin i, poc: 0.03 ng/mL (ref 0.00–0.08)

## 2017-02-01 LAB — I-STAT CG4 LACTIC ACID, ED: Lactic Acid, Venous: 0.92 mmol/L (ref 0.5–1.9)

## 2017-02-01 LAB — MAGNESIUM: Magnesium: 2.5 mg/dL — ABNORMAL HIGH (ref 1.7–2.4)

## 2017-02-01 LAB — TSH: TSH: 0.526 u[IU]/mL (ref 0.350–4.500)

## 2017-02-01 MED ORDER — LEVOTHYROXINE SODIUM 75 MCG PO TABS
75.0000 ug | ORAL_TABLET | Freq: Every day | ORAL | Status: DC
  Administered 2017-02-02: 75 ug via ORAL
  Filled 2017-02-01: qty 1

## 2017-02-01 MED ORDER — CARVEDILOL 3.125 MG PO TABS: 3.1250 mg | ORAL_TABLET | Freq: Two times a day (BID) | ORAL | Status: DC

## 2017-02-01 MED ORDER — FUROSEMIDE 80 MG PO TABS
80.0000 mg | ORAL_TABLET | Freq: Two times a day (BID) | ORAL | Status: DC
  Administered 2017-02-02: 80 mg via ORAL
  Filled 2017-02-01: qty 1

## 2017-02-01 MED ORDER — ISOSORB DINITRATE-HYDRALAZINE 20-37.5 MG PO TABS
1.0000 | ORAL_TABLET | Freq: Three times a day (TID) | ORAL | Status: DC
  Administered 2017-02-01 – 2017-02-02 (×3): 1 via ORAL
  Filled 2017-02-01 (×4): qty 1

## 2017-02-01 MED ORDER — VITAMIN B-12 1000 MCG PO TABS: 1000.0000 ug | ORAL_TABLET | ORAL | Status: DC

## 2017-02-01 MED ORDER — TIMOLOL MALEATE 0.5 % OP SOLN
1.0000 [drp] | Freq: Two times a day (BID) | OPHTHALMIC | Status: DC
  Administered 2017-02-02: 1 [drp] via OPHTHALMIC
  Filled 2017-02-01 (×2): qty 5

## 2017-02-01 MED ORDER — INSULIN ASPART 100 UNIT/ML ~~LOC~~ SOLN
0.0000 [IU] | Freq: Three times a day (TID) | SUBCUTANEOUS | Status: DC
  Administered 2017-02-02: 1 [IU] via SUBCUTANEOUS

## 2017-02-01 MED ORDER — TRAMADOL HCL 50 MG PO TABS
50.0000 mg | ORAL_TABLET | Freq: Once | ORAL | Status: AC
  Administered 2017-02-01: 50 mg via ORAL
  Filled 2017-02-01: qty 1

## 2017-02-01 MED ORDER — POTASSIUM CHLORIDE CRYS ER 20 MEQ PO TBCR
40.0000 meq | EXTENDED_RELEASE_TABLET | Freq: Once | ORAL | Status: AC
  Administered 2017-02-01: 40 meq via ORAL
  Filled 2017-02-01: qty 2

## 2017-02-01 MED ORDER — FUROSEMIDE 20 MG PO TABS
40.0000 mg | ORAL_TABLET | Freq: Two times a day (BID) | ORAL | Status: DC
Start: 2017-02-01 — End: 2017-02-01

## 2017-02-01 MED ORDER — ATORVASTATIN CALCIUM 80 MG PO TABS
80.0000 mg | ORAL_TABLET | Freq: Every day | ORAL | Status: DC
  Filled 2017-02-01: qty 1

## 2017-02-01 MED ORDER — LATANOPROST 0.005 % OP SOLN
1.0000 [drp] | Freq: Every day | OPHTHALMIC | Status: DC
  Filled 2017-02-01: qty 2.5

## 2017-02-01 MED ORDER — MINOXIDIL 2.5 MG PO TABS
2.5000 mg | ORAL_TABLET | Freq: Every day | ORAL | Status: DC
  Administered 2017-02-02: 2.5 mg via ORAL
  Filled 2017-02-01: qty 1

## 2017-02-01 MED ORDER — SODIUM CHLORIDE 0.9 % IV SOLN
INTRAVENOUS | Status: DC
  Administered 2017-02-01: 15:00:00 via INTRAVENOUS

## 2017-02-01 MED ORDER — ASPIRIN EC 81 MG PO TBEC
81.0000 mg | DELAYED_RELEASE_TABLET | Freq: Every day | ORAL | Status: DC
  Administered 2017-02-02: 81 mg via ORAL
  Filled 2017-02-01 (×2): qty 1

## 2017-02-01 MED ORDER — POTASSIUM CHLORIDE 10 MEQ/100ML IV SOLN
10.0000 meq | INTRAVENOUS | Status: AC
  Administered 2017-02-01 (×3): 10 meq via INTRAVENOUS
  Filled 2017-02-01 (×2): qty 100

## 2017-02-01 MED ORDER — INSULIN GLARGINE 100 UNIT/ML ~~LOC~~ SOLN
5.0000 [IU] | Freq: Every day | SUBCUTANEOUS | Status: DC
  Administered 2017-02-01: 5 [IU] via SUBCUTANEOUS
  Filled 2017-02-01 (×2): qty 0.05

## 2017-02-01 MED ORDER — CARVEDILOL 3.125 MG PO TABS
3.1250 mg | ORAL_TABLET | Freq: Two times a day (BID) | ORAL | Status: DC
  Administered 2017-02-02: 3.125 mg via ORAL
  Filled 2017-02-01: qty 1

## 2017-02-01 MED ORDER — SODIUM CHLORIDE 0.9% FLUSH
3.0000 mL | Freq: Two times a day (BID) | INTRAVENOUS | Status: DC
  Administered 2017-02-01 – 2017-02-02 (×3): 3 mL via INTRAVENOUS

## 2017-02-01 MED ORDER — SODIUM CHLORIDE 0.9 % IV SOLN
INTRAVENOUS | Status: AC
  Administered 2017-02-01 – 2017-02-02 (×2): via INTRAVENOUS

## 2017-02-01 MED ORDER — VITAMIN B 12 100 MCG PO LOZG: 1000.0000 ug | LOZENGE | ORAL | Status: DC

## 2017-02-01 MED ORDER — HEPARIN SODIUM (PORCINE) 5000 UNIT/ML IJ SOLN
5000.0000 [IU] | Freq: Three times a day (TID) | INTRAMUSCULAR | Status: DC
  Administered 2017-02-01 – 2017-02-02 (×3): 5000 [IU] via SUBCUTANEOUS
  Filled 2017-02-01 (×3): qty 1

## 2017-02-01 MED ORDER — POTASSIUM CHLORIDE 10 MEQ/50ML IV SOLN: 10.0000 meq | INTRAVENOUS | Status: DC

## 2017-02-01 NOTE — ED Notes (Signed)
Patient transported to CT/XRay.

## 2017-02-01 NOTE — H&P (Signed)
History and Physical    Alyssa [name] KGY:[id number] DOB: May 31, 1946 DOA: 02/01/2017   PCP: Patient, No Pcp Per   Patient coming from:  Home    Chief Complaint: "I passed out"  HPI: Alyssa [name] is a 71 y.o. female with medical history significant  for chronic anemia, bipolar disorder, diabetes, hyperlipidemia, hypertension, hypothyroidism, recent diagnosis of CK D3, brought by EMS, after, while sitting with her daughter, she had an episode of syncope, staring at her daughter, and rolling her eyes app, brief shaking activity, then becoming limp. She was eased to the ground to supine position without injury. Within less than a minute, the patient regain consciousness, and return to normal mental status. Daughter denies that the patient had any. Without breathing. Upon waking up, she vomited one large amount of non-bloody liquid with food material At this time, the patient denies any new symptoms. Denies any vision changes, headaches. No dysarthria or dysphagia. Denies any unilateral weakness or sensory deficiencies. Denies any chest pain, palpitations, or shortness of breath. Denies any fever or chills, or night sweats. Denies any abdominal pain, or diarrhea. Denies any sick contacts or new foods, last meal was cereal and milk. Denies any recent long distance trips. No recent surgeries. Denies lower extremity swelling. Denies abnormal skin rashes, or neuropathy. Compliant with her medications  ED Course:  BP (!) 105/55   Pulse 72   Temp 97.6 F (36.4 C) (Oral)   Resp 14   SpO2 99%    white count 7.3 hemoglobin 9.9 platelets 306  sodium 132, corrected 134 (BL 140)  potassium 3.2 by carb 25 creatinine 4.96, slightly above her baseline CT of the head negative for acute findings chest x-ray with mild cardiomegaly, otherwise negative for acute disease INR 0.95   Review of Systems:  As per HPI otherwise all other systems reviewed and are negative  Past Medical History:  Diagnosis Date  .  Anemia   . Bipolar 1 disorder ([place])   . Cataract   . Chronic back pain   . Diabetes mellitus without complication ([place])   . Glaucoma   . Hyperlipidemia   . Hypertension   . Hypothyroidism   . Osteoporosis     Past Surgical History:  Procedure Laterality Date  . BACK SURGERY  2007  . BREAST SURGERY Left    CYST REMOVAL  . DILATION AND CURETTAGE OF UTERUS    . EYE SURGERY Left July 2015   Lt CE/ IOL implant  . EYE SURGERY Right July 2016   Rt CE/IOL  . IR THORACENTESIS ASP PLEURAL SPACE W/IMG GUIDE  01/10/2017  . KNEE ARTHROSCOPY Bilateral   . LUMBAR FUSION  2009    Social History Social History   Social History  . Marital status: Married    Spouse name: N/A  . Number of children: N/A  . Years of education: N/A   Occupational History  .  Retired   Social History Main Topics  . Smoking status: Never Smoker  . Smokeless tobacco: Never Used  . Alcohol use Yes     Comment: social drinker- glass of wine or cocktail  . Drug use: No  . Sexual activity: No   Other Topics Concern  . Not on file   Social History Narrative  . No narrative on file     Allergies  Allergen Reactions  . Ciprofloxacin Hcl Diarrhea  . Cymbalta [Duloxetine Hcl] Other (See Comments)    Per pt: it did not work  .  Xanax [Alprazolam] Other (See Comments)    Excessive sleepiness  . Codeine Other (See Comments)    Per pt: unknown  . Lortab [Hydrocodone-Acetaminophen] Other (See Comments)    Per pt: unknown  . Oxycodone Other (See Comments)    Per pt: unknown    Family History  Problem Relation Age of Onset  . Adopted: Yes      Prior to Admission medications   Medication Sig Start Date End Date Taking? Authorizing Provider  ACCU-CHEK AVIVA PLUS test strip USE TO TEST BLOOD SUGAR THREE TIMES DAILY 06/28/16   Forcucci, Loma Sousa, PA-C  aspirin 81 MG tablet Take 81 mg by mouth daily.    [provider]  atorvastatin (LIPITOR) 80 MG tablet TAKE 1 TABLET(80 MG) BY MOUTH DAILY  06/05/16   Unk Pinto, MD  Blood Glucose Monitoring Suppl (ACCU-CHEK AVIVA PLUS) W/DEVICE KIT  09/13/14   [provider]  carvedilol (COREG) 3.125 MG tablet Take 1 tablet (3.125 mg total) by mouth 2 (two) times daily with a meal. 01/12/17   Florencia Reasons, MD  Cholecalciferol (VITAMIN D-3 PO) Take 5,000 Units by mouth every evening.     [provider]  Cyanocobalamin (VITAMIN B 12 PO) Take 1,000 mcg by mouth every other day.    [provider]  furosemide (LASIX) 40 MG tablet Take 3 tablets (120 mg total) by mouth 2 (two) times daily. 01/12/17   Florencia Reasons, MD  glipiZIDE (GLUCOTROL) 5 MG tablet Take 0.5 tablets (2.5 mg total) by mouth daily before breakfast. 01/12/17 01/12/18  Florencia Reasons, MD  insulin glargine (LANTUS) 100 UNIT/ML injection Inject 0.05 mLs (5 Units total) into the skin at bedtime. 01/12/17   Florencia Reasons, MD  isosorbide-hydrALAZINE (BIDIL) 20-37.5 MG tablet Take 1 tablet by mouth 3 (three) times daily. 01/12/17   Florencia Reasons, MD  Lancets (FREESTYLE) lancets Use as instructed 09/15/15   Forcucci, Loma Sousa, PA-C  latanoprost (XALATAN) 0.005 % ophthalmic solution Place 1 drop into both eyes every evening.  03/31/14   [provider]  levothyroxine (SYNTHROID, LEVOTHROID) 75 MCG tablet Take 1 tablet (75 mcg total) by mouth daily before breakfast. 01/13/17   Florencia Reasons, MD  minoxidil (LONITEN) 10 MG tablet TAKE 1/2 TO 1 TABLET BY MOUTH DAILY AS DIRECTED FOR BLOOD PRESSURE Patient taking differently: TAKE 1 TABLET BY MOUTH DAILY AS DIRECTED FOR BLOOD PRESSURE 09/20/16   Vicie Mutters, PA-C  Multiple Vitamin (MULTIVITAMIN) tablet Take 1 tablet by mouth daily.    [provider]  Omega-3 Fatty Acids (FISH OIL) 1000 MG CAPS Take 1,000 mg by mouth every evening.     [provider]  timolol (TIMOPTIC) 0.5 % ophthalmic solution INSTILL 1 DROP IN BOTH EYES TWICE DAILY 10/21/15   Unk Pinto, MD    Physical Exam:  Vitals:   02/01/17 1145 02/01/17 1200  02/01/17 1330 02/01/17 1400  BP: (!) 109/58 (!) 109/53 (!) 120/54 (!) 105/55  Pulse: 71 65 70 72  Resp: _0 Temp:      TempSrc:      SpO2: 97% 96% 95% 99%   Constitutional: NAD, calm, comfortable  Eyes: PERRL, lids and conjunctivae normal, mild exhophtalmus  ENMT: Mucous membranes are moist, without exudate or lesions  Neck: normal, supple, no masses, no thyromegaly Respiratory: clear to auscultation bilaterally, no wheezing, no crackles. Normal respiratory effort  Cardiovascular: Regular rate and rhythm, 2/6 harsh M in LUSB, and one with higher pitch at the EUSB  murmurs, no rubs or  gallops. No extremity edema. 2+ pedal pulses. No carotid bruits.  Abdomen:  Obese Soft, non tender, No hepatosplenomegaly. Bowel sounds positive.  Musculoskeletal: no clubbing / cyanosis. Moves all extremities. Slight bruising on the R ankle  Skin: no jaundice, No lesions, minimal ecchymoses .  Neurologic: Sensation intact  Strength equal in all extremities Psychiatric:   Alert and oriented x 3.Flat affect    Labs on Admission: I have personally reviewed following labs and imaging studies  CBC:  Recent Labs Lab 02/01/17 1149  WBC 7.3  NEUTROABS 5.5  HGB 9.9*  HCT 32.2*  MCV 92.5  PLT 161    Basic Metabolic Panel:  Recent Labs Lab 02/01/17 1149  NA 132*  K 3.2*  CL 93*  CO2 25  GLUCOSE 195*  BUN 85*  CREATININE 4.96*  CALCIUM 10.2    GFR: Estimated Creatinine Clearance: 10.6 mL/min (A) (by C-G formula based on SCr of 4.96 mg/dL (H)).  Liver Function Tests:  Recent Labs Lab 02/01/17 1149  AST 21  ALT 15  ALKPHOS 53  BILITOT 0.4  PROT 6.8  ALBUMIN 3.9   No results for input(s): LIPASE, AMYLASE in the last 168 hours. No results for input(s): AMMONIA in the last 168 hours.  Coagulation Profile:  Recent Labs Lab 02/01/17 1149  INR 0.95    Cardiac Enzymes: No results for input(s): CKTOTAL, CKMB, CKMBINDEX, TROPONINI in the last 168 hours.  BNP (last 3  results) No results for input(s): PROBNP in the last 8760 hours.  HbA1C: No results for input(s): HGBA1C in the last 72 hours.  CBG: No results for input(s): GLUCAP in the last 168 hours.  Lipid Profile: No results for input(s): CHOL, HDL, LDLCALC, TRIG, CHOLHDL, LDLDIRECT in the last 72 hours.  Thyroid Function Tests: No results for input(s): TSH, T4TOTAL, FREET4, T3FREE, THYROIDAB in the last 72 hours.  Anemia Panel: No results for input(s): VITAMINB12, FOLATE, FERRITIN, TIBC, IRON, RETICCTPCT in the last 72 hours.  Urine analysis:    Component Value Date/Time   COLORURINE YELLOW 02/01/2017 1300   APPEARANCEUR CLEAR 02/01/2017 1300   LABSPEC 1.012 02/01/2017 1300   PHURINE 5.0 02/01/2017 1300   GLUCOSEU NEGATIVE 02/01/2017 1300   HGBUR NEGATIVE 02/01/2017 1300   BILIRUBINUR NEGATIVE 02/01/2017 1300   KETONESUR NEGATIVE 02/01/2017 1300   PROTEINUR 30 (A) 02/01/2017 1300   UROBILINOGEN 0.2 02/14/2015 1157   NITRITE NEGATIVE 02/01/2017 1300   LEUKOCYTESUR NEGATIVE 02/01/2017 1300    Sepsis Labs: _0 (procalcitonin:4,lacticidven:4) )No results found for this or any previous visit (from the past 240 hour(s)).   Radiological Exams on Admission: Dg Chest 2 View  Result Date: 02/01/2017 CLINICAL DATA:  witnessed syncopal episode while at renal office. Pt was seated, did not fall; out < 30 seconds; per EMS, pt was pale VSS, no orthostatic changes; CBG: 232 Pt was admitted x 1 week ago for low hemoglobin. EXAM: CHEST - 2 VIEW COMPARISON:  01/10/2017 FINDINGS: Lungs are clear. Heart size upper limits normal.  Atheromatous aorta. No effusion.  No pneumothorax. Degenerative changes in bilateral shoulders. Anterior vertebral endplate spurring at multiple levels in the mid and lower thoracic spine. IMPRESSION: Mild cardiomegaly.  No acute findings. Electronically Signed   By: Lucrezia Europe M.D.   On: 02/01/2017 12:28   Ct Head Wo Contrast  Result Date: 02/01/2017 CLINICAL DATA:   Syncope EXAM: CT HEAD WITHOUT CONTRAST TECHNIQUE: Contiguous axial images were obtained from the base of the skull through the vertex without intravenous contrast. COMPARISON:  Brain  MRI and January 13, 2014 and head CT January 04, 2017 FINDINGS: Brain: The ventricles are normal in size and configuration. There is moderate frontal atrophy bilaterally, more severe on the left than on the right, stable. There is also moderate temporal lobe atrophy bilaterally, stable. There is no intracranial mass, hemorrhage, extra-axial fluid collection, or midline shift. There is patchy small vessel disease in the centra semiovale bilaterally. Elsewhere gray-white compartments appear normal. No acute infarct evident. Vascular: No hyperdense vessel. There is calcification in each distal vertebral artery as well as in each carotid siphon region. Skull: The bony calvarium appears intact. Sinuses/Orbits: There is mucosal thickening in several ethmoid air cells bilaterally. Visualized orbits appear symmetric bilaterally. Other: Mastoid air cells are clear. IMPRESSION: Stable atrophy with patchy supratentorial small vessel disease. No intracranial mass, hemorrhage, or extra-axial fluid collection. No acute infarct evident. Areas of arterial vascular calcification. Mucosal thickening in several ethmoid air cells bilaterally. Electronically Signed   By: Lowella Grip III M.D.   On: 02/01/2017 12:51    EKG: Independently reviewed.  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 . CT head negative . UA neg Neuro exam unremarkable VSS, afebrile Syncope order set  admit for  observation - Tele bed. seizure precautions 2 D echo IV fluids EKG in am If seizure appears, then will initiate seizure protocol including Ativan IV    Hypertension BP 105/55   Pulse 72   Continue home anti-hypertensive medications in am due to above Check orthostatics   Hypokalemia, at 2.3, likely due to volume loss Check Mg Replenish  Check EKG in am   Hyperlipidemia Continue home statins   Chronic  CHF  CXR  Last 2 D echo 01/05/17 severe LVH, EF 55-60, Gr1 DD. Took her Lasix in am, and has been taking as an outpatient after 3 times a day. Per chart it states that she was on 120 mg BID PLace on Telemetry obs  will continue to provide Lasix, it is the more cautious dose to improve her renal status  continue Coreg monitor I/Os and daily weights prn 02   Hypothyroidism: Continue home Synthroid  Chronic kidney disease stage 3   baseline creatinine 4.96 sligh increase from baseline at 4.3 , in the setting of diuresis and dehydration . She was on 120 mg Lasix bid, will recommend 80 mg bid for now, until she is well hydrated  Lab Results  Component Value Date   CREATININE 4.96 (H) 02/01/2017   CREATININE 4.28 (H) 01/12/2017   CREATININE 4.14 (H) 01/11/2017  IVF Repeat CMET in am    Type II Diabetes Current blood sugar level is 195 Lab Results  Component Value Date   HGBA1C 7.4 (H) 01/04/2017  Hold home oral diabetic medications.  Lantus , SSI  Anemia of chronic disease Hemoglobin on admission 9.9  At baseline  Repeat CBC in am  No transfusion is indicated at this time    DVT prophylaxis Heparin   Code Status:   Full     Family Communication:  Discussed with patient Disposition Plan: Expect patient to  be discharged to home after condition improves Consults called:  None   Admission status:Tele   Obs    Rondel Jumbo, PA-C [facility]   02/01/2017, 2:12 PM

## 2017-02-01 NOTE — Care Management Obs Status (Signed)
[place] NOTIFICATION   Patient Details  Name: ARTASIA THANG MRN: [id number] Date of Birth: 1945/10/07   Medicare Observation Status Notification Given:  Ernesta Amble, RN 02/01/2017, 3:15 PM

## 2017-02-01 NOTE — ED Notes (Signed)
Patient is stable and ready to be transport to the floor at this time.  Report was called to [place].  Belongings taken with the patient to the floor.

## 2017-02-01 NOTE — ED Notes (Signed)
Patient transported to CT 

## 2017-02-01 NOTE — ED Provider Notes (Signed)
[facility] DEPT Provider Note   CSN: [id number] Arrival date & time: 02/01/17  1102     History   Chief Complaint Chief Complaint  Patient presents with  . Loss of Consciousness    HPI Alyssa [name] is a 71 y.o. female.  HPI Patient was hospitalized 2 weeks ago. She reports the first week post hospitalization she was having fatigue and anorexia. She reports this week however she's felt much better. She reports she's been eating again and her energy level has returned. She was at a meeting for dialysis education this morning with family members. She had been seated and her daughter looked back at her and saw her eyes roll up in her head. She had some brief shaking activity and then became limp and was eased to the ground to supine position without injury. Within less than a minute she did regain consciousness and normal mental status. Patient's daughter reports that she was very pale in appearance. She was not sure if there was a period where she was not breathing. She vomited one large amount after the episode ended. Patient reports now she feels back to normal. She did eat a bowl of cereal for breakfast this morning. At this time she reports she has no symptoms. Past Medical History:  Diagnosis Date  . Anemia   . Bipolar 1 disorder ([place])   . Cataract   . Chronic back pain   . Diabetes mellitus without complication ([place])   . Glaucoma   . Hyperlipidemia   . Hypertension   . Hypothyroidism   . Osteoporosis     Patient Active Problem List   Diagnosis Date Noted  . 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 Pleural effusion on right 01/24/2017  . Acute renal failure with acute tubular necrosis superimposed on stage 3 chronic kidney disease ([place])   . Acute on chronic diastolic CHF (congestive heart failure) ([place])   . Abnormal liver function   . Anemia 01/04/2017  . Acute CHF (congestive heart failure) ([place]) 01/04/2017  . T2_NIDDM 03/31/2015  . Actinic  keratitis 02/23/2015  . Encounter for Medicare annual wellness exam 02/09/2015  . Medication management 07/14/2014  . Hypothyroidism 07/14/2014  . CKD (chronic kidney disease) stage 3, GFR 30-59 ml/min 06/24/2014  . Gout 06/24/2014  . Acute diastolic congestive heart failure ([place]) 10/13/2013  . Morbid obesity (BMI 39.21)  07/15/2013  . Depression, major, in partial remission ([place]) 07/15/2013  . Vitamin D deficiency   . Osteoporosis   . 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 Hypertension   . Bipolar 1 disorder ([place])   . Hyperlipidemia     Past Surgical History:  Procedure Laterality Date  . BACK SURGERY  2007  . BREAST SURGERY Left    CYST REMOVAL  . DILATION AND CURETTAGE OF UTERUS    . EYE SURGERY Left July 2015   Lt CE/ IOL implant  . EYE SURGERY Right July 2016   Rt CE/IOL  . IR THORACENTESIS ASP PLEURAL SPACE W/IMG GUIDE  01/10/2017  . KNEE ARTHROSCOPY Bilateral   . LUMBAR FUSION  2009    OB History    No data available       Home Medications    Prior to Admission medications   Medication Sig Start Date End Date Taking? Authorizing Provider  ACCU-CHEK AVIVA PLUS test strip USE TO TEST BLOOD SUGAR THREE TIMES DAILY 06/28/16   Forcucci, Loma Sousa, PA-C  aspirin 81 MG tablet Take 81 mg by mouth daily.  [provider]  atorvastatin (LIPITOR) 80 MG tablet TAKE 1 TABLET(80 MG) BY MOUTH DAILY 06/05/16   Unk Pinto, MD  Blood Glucose Monitoring Suppl (ACCU-CHEK AVIVA PLUS) W/DEVICE KIT  09/13/14   [provider]  carvedilol (COREG) 3.125 MG tablet Take 1 tablet (3.125 mg total) by mouth 2 (two) times daily with a meal. 01/12/17   Florencia Reasons, MD  Cholecalciferol (VITAMIN D-3 PO) Take 5,000 Units by mouth every evening.     [provider]  Cyanocobalamin (VITAMIN B 12 PO) Take 1,000 mcg by mouth every other day.    [provider]  furosemide (LASIX) 40 MG tablet Take 3 tablets (120 mg total) by mouth 2 (two) times daily. 01/12/17   Florencia Reasons, MD    glipiZIDE (GLUCOTROL) 5 MG tablet Take 0.5 tablets (2.5 mg total) by mouth daily before breakfast. 01/12/17 01/12/18  Florencia Reasons, MD  insulin glargine (LANTUS) 100 UNIT/ML injection Inject 0.05 mLs (5 Units total) into the skin at bedtime. 01/12/17   Florencia Reasons, MD  isosorbide-hydrALAZINE (BIDIL) 20-37.5 MG tablet Take 1 tablet by mouth 3 (three) times daily. 01/12/17   Florencia Reasons, MD  Lancets (FREESTYLE) lancets Use as instructed 09/15/15   Forcucci, Loma Sousa, PA-C  latanoprost (XALATAN) 0.005 % ophthalmic solution Place 1 drop into both eyes every evening.  03/31/14   [provider]  levothyroxine (SYNTHROID, LEVOTHROID) 75 MCG tablet Take 1 tablet (75 mcg total) by mouth daily before breakfast. 01/13/17   Florencia Reasons, MD  minoxidil (LONITEN) 10 MG tablet TAKE 1/2 TO 1 TABLET BY MOUTH DAILY AS DIRECTED FOR BLOOD PRESSURE Patient taking differently: TAKE 1 TABLET BY MOUTH DAILY AS DIRECTED FOR BLOOD PRESSURE 09/20/16   Vicie Mutters, PA-C  Multiple Vitamin (MULTIVITAMIN) tablet Take 1 tablet by mouth daily.    [provider]  Omega-3 Fatty Acids (FISH OIL) 1000 MG CAPS Take 1,000 mg by mouth every evening.     [provider]  timolol (TIMOPTIC) 0.5 % ophthalmic solution INSTILL 1 DROP IN BOTH EYES TWICE DAILY 10/21/15   Unk Pinto, MD    Family History Family History  Problem Relation Age of Onset  . Adopted: Yes    Social History Social History  Substance Use Topics  . Smoking status: Never Smoker  . Smokeless tobacco: Never Used  . Alcohol use Yes     Comment: social drinker- glass of wine or cocktail     Allergies   Ciprofloxacin hcl; Cymbalta [duloxetine hcl]; Xanax [alprazolam]; Codeine; Lortab [hydrocodone-acetaminophen]; and Oxycodone   Review of Systems Review of Systems 10 Systems reviewed and are negative for acute change except as noted in the HPI.  Physical Exam Updated Vital Signs BP (!) 109/53   Pulse 65   Temp 97.6 F (36.4 C) (Oral)    Resp 15   SpO2 96%   Physical Exam  Constitutional: She is oriented to person, place, and time. She appears well-developed and well-nourished. No distress.  HENT:  Head: Normocephalic and atraumatic.  Nose: Nose normal.  Mouth/Throat: Oropharynx is clear and moist.  Eyes: Pupils are equal, round, and reactive to light. Conjunctivae and EOM are normal.  Very mild diffuse erythema around both eyes without swelling. (Patient reports she used some new facial wipes yesterday in the S1 this redness started)  Neck: Neck supple.  Cardiovascular: Normal rate and regular rhythm.   No murmur heard. Pulmonary/Chest: Effort normal and breath sounds normal. No respiratory distress.  Abdominal: Soft. There is no tenderness.  Musculoskeletal: Normal range of motion. She exhibits no edema, tenderness or deformity.  Neurological: She is alert and oriented to person, place, and time. No cranial nerve deficit or sensory deficit. She exhibits normal muscle tone. Coordination normal.  Skin: Skin is warm and dry.  Psychiatric: She has a normal mood and affect.  Nursing note and vitals reviewed.    ED Treatments / Results  Labs (all labs ordered are listed, but only abnormal results are displayed) Labs Reviewed  COMPREHENSIVE METABOLIC PANEL - Abnormal; Notable for the following:       Result Value   Sodium 132 (*)    Potassium 3.2 (*)    Chloride 93 (*)    Glucose, Bld 195 (*)    BUN 85 (*)    Creatinine, Ser 4.96 (*)    GFR calc non Af Amer 8 (*)    GFR calc Af Amer 9 (*)    All other components within normal limits  CBC WITH DIFFERENTIAL/PLATELET - Abnormal; Notable for the following:    RBC 3.48 (*)    Hemoglobin 9.9 (*)    HCT 32.2 (*)    All other components within normal limits  CULTURE, BLOOD (ROUTINE X 2)  CULTURE, BLOOD (ROUTINE X 2)  ETHANOL  PROTIME-INR  URINALYSIS, ROUTINE W REFLEX MICROSCOPIC  RAPID URINE DRUG SCREEN, HOSP PERFORMED  I-STAT CG4 LACTIC ACID, ED  I-STAT  TROPOININ, ED  POC OCCULT BLOOD, ED    EKG  EKG Interpretation  Date/Time:  Friday February 01 2017 11:09:48 EDT Ventricular Rate:  70 PR Interval:    QRS Duration: 107 QT Interval:  451 QTC Calculation: 487 R Axis:   -46 Text Interpretation:  Sinus rhythm LAD, consider left anterior fascicular block Consider anterior infarct Minimal ST depression, lateral leads no change from previous Confirmed by Charlesetta Shanks [phone]) on 02/01/2017 11:16:02 AM       Radiology Dg Chest 2 View  Result Date: 02/01/2017 CLINICAL DATA:  witnessed syncopal episode while at renal office. Pt was seated, did not fall; out < 30 seconds; per EMS, pt was pale VSS, no orthostatic changes; CBG: 232 Pt was admitted x 1 week ago for low hemoglobin. EXAM: CHEST - 2 VIEW COMPARISON:  01/10/2017 FINDINGS: Lungs are clear. Heart size upper limits normal.  Atheromatous aorta. No effusion.  No pneumothorax. Degenerative changes in bilateral shoulders. Anterior vertebral endplate spurring at multiple levels in the mid and lower thoracic spine. IMPRESSION: Mild cardiomegaly.  No acute findings. Electronically Signed   By: Lucrezia Europe M.D.   On: 02/01/2017 12:28   Ct Head Wo Contrast  Result Date: 02/01/2017 CLINICAL DATA:  Syncope EXAM: CT HEAD WITHOUT CONTRAST TECHNIQUE: Contiguous axial images were obtained from the base of the skull through the vertex without intravenous contrast. COMPARISON:  Brain MRI and January 13, 2014 and head CT January 04, 2017 FINDINGS: Brain: The ventricles are normal in size and configuration. There is moderate frontal atrophy bilaterally, more severe on the left than on the right, stable. There is also moderate temporal lobe atrophy bilaterally, stable. There is no intracranial mass, hemorrhage, extra-axial fluid collection, or midline shift. There is patchy small vessel disease in the centra semiovale bilaterally. Elsewhere gray-white compartments appear normal. No acute infarct evident. Vascular: No  hyperdense vessel. There is calcification in each distal vertebral artery as well as in each carotid siphon region. Skull: The bony calvarium appears intact. Sinuses/Orbits: There is mucosal thickening in several ethmoid air cells bilaterally. Visualized orbits appear symmetric  bilaterally. Other: Mastoid air cells are clear. IMPRESSION: Stable atrophy with patchy supratentorial small vessel disease. No intracranial mass, hemorrhage, or extra-axial fluid collection. No acute infarct evident. Areas of arterial vascular calcification. Mucosal thickening in several ethmoid air cells bilaterally. Electronically Signed   By: Lowella Grip III M.D.   On: 02/01/2017 12:51    Procedures Procedures (including critical care time)  Medications Ordered in ED Medications - No data to display   Initial Impression / Assessment and Plan / ED Course  I have reviewed the triage vital signs and the nursing notes.  Pertinent labs & imaging results that were available during my care of the patient were reviewed by me and considered in my medical decision making (see chart for details).    Consult: [facility] hospitalist for admission  Final Clinical Impressions(s) / ED Diagnoses   Final diagnoses:  Syncope and collapse  Severe comorbid illness  Stage 5 chronic kidney disease not on chronic dialysis [facility])  Patient has significant comorbid illness and had a syncopal episode whereby some brief period of tonic-clonic activity observed then complete loss of consciousness. At this time, the patient is clinically resolved. She does have normal mental status and normal neurologic exam. Patient however has severe comorbid illness and cannot rule out possible dysrhythmic event or seizure. Plan for admission and observation  New Prescriptions New Prescriptions   No medications on file     Charlesetta Shanks, MD 02/06/17 2006

## 2017-02-01 NOTE — ED Triage Notes (Addendum)
Pt presents with witnessed syncopal episode while at renal office.  Pt was seated, did not fall; out < 30 seconds; per EMS, pt was pale  VSS, no orthostatic changes; CBG: 232  Pt was admitted x 1 week ago for low hemoglobin.

## 2017-02-02 DIAGNOSIS — R55 Syncope and collapse: Secondary | ICD-10-CM | POA: Diagnosis not present

## 2017-02-02 DIAGNOSIS — I272 Pulmonary hypertension, unspecified: Secondary | ICD-10-CM | POA: Diagnosis not present

## 2017-02-02 DIAGNOSIS — I130 Hypertensive heart and chronic kidney disease with heart failure and stage 1 through stage 4 chronic kidney disease, or unspecified chronic kidney disease: Secondary | ICD-10-CM | POA: Diagnosis not present

## 2017-02-02 DIAGNOSIS — E1122 Type 2 diabetes mellitus with diabetic chronic kidney disease: Secondary | ICD-10-CM | POA: Diagnosis not present

## 2017-02-02 DIAGNOSIS — N183 Chronic kidney disease, stage 3 (moderate): Secondary | ICD-10-CM | POA: Diagnosis not present

## 2017-02-02 LAB — GLUCOSE, CAPILLARY
Glucose-Capillary: 123 mg/dL — ABNORMAL HIGH (ref 65–99)
Glucose-Capillary: 101 mg/dL — ABNORMAL HIGH (ref 65–99)
Glucose-Capillary: 139 mg/dL — ABNORMAL HIGH (ref 65–99)

## 2017-02-02 MED ORDER — FUROSEMIDE 40 MG PO TABS: 80.0000 mg | ORAL_TABLET | Freq: Two times a day (BID) | ORAL | 0 refills | Status: AC

## 2017-02-02 NOTE — Progress Notes (Signed)
Pt got discharged to home, discharge instructions provided and patient showed understanding to it, IV taken out,Telemonitor DC,pt left unit in wheelchair with all of the belongings accompanied with a family member (daughter) 

## 2017-02-02 NOTE — Discharge Instructions (Signed)
Syncope Syncope is when you lose temporarily pass out (faint). Signs that you may be about to pass out include:  Feeling dizzy or light-headed.  Feeling sick to your stomach (nauseous).  Seeing all white or all black.  Having cold, clammy skin.  If you passed out, get help right away. Call your local emergency services (911 in the [place] Do not drive yourself to the hospital. Follow these instructions at home: Pay attention to any changes in your symptoms. Take these actions to help with your condition:  Have someone stay with you until you feel stable.  Do not drive, use machinery, or play sports until your doctor says it is okay.  Keep all follow-up visits as told by your doctor. This is important.  If you start to feel like you might pass out, lie down right away and raise (elevate) your feet above the level of your heart. Breathe deeply and steadily. Wait until all of the symptoms are gone.  Drink enough fluid to keep your pee (urine) clear or pale yellow.  If you are taking blood pressure or heart medicine, get up slowly and spend many minutes getting ready to sit and then stand. This can help with dizziness.  Take over-the-counter and prescription medicines only as told by your doctor.  Get help right away if:  You have a very bad headache.  You have unusual pain in your chest, tummy, or back.  You are bleeding from your mouth or rectum.  You have black or tarry poop (stool).  You have a very fast or uneven heartbeat (palpitations).  It hurts to breathe.  You pass out once or more than once.  You have jerky movements that you cannot control (seizure).  You are confused.  You have trouble walking.  You are very weak.  You have vision problems. These symptoms may be an emergency. Do not wait to see if the symptoms will go away. Get medical help right away. Call your local emergency services (911 in the [place] Do not drive yourself to the hospital. This  information is not intended to replace advice given to you by your health care provider. Make sure you discuss any questions you have with your health care provider. Document Released: 12/26/2007 Document Revised: 12/15/2015 Document Reviewed: 03/23/2015 Elsevier Interactive Patient Education  2018 [employer]  

## 2017-02-02 NOTE — Discharge Summary (Signed)
[facility]  Physician Discharge Summary   Patient ID: Alyssa [name] MRN: [id number] DOB/AGE: Nov 24, 1945 71 y.o.  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 Patient's dose of Lasix has been reduced 2. She will need outpatient blood work including Renal function 3. Outpatient referral has been sent for EEG to [facility].   DISCHARGE CONDITION: fair  Diet recommendation: As before  Filed Weights   02/01/17 1619 02/02/17 0611  Weight: 83.7 kg (184 lb 8 oz) 86.4 kg (190 lb 7.6 oz)    INITIAL HISTORY: 71 year old Caucasian female with a past medical history of anemia, bipolar disorder, diabetes, hypertension, chronic kidney disease stage III presented with syncope.  Consultations:  None  Procedures:  None  HOSPITAL COURSE:   Syncope Etiology unclear, however, orthostatic vital signs did show drop in her systolic pressure from lying to standing. She could've also had a vasovagal reaction. There was some mention of "shaking" episodes. CT head did not show any concerning changes. Patient without any known history of seizure disorder. There was no urinary incontinence. No tongue bites. She may benefit from EEG. Unfortunately, this is not available here this weekend. Outpatient referral has been sent for same to [place]. Orthostatics repeated this morning and are normal. Patient was recently hospitalized in June and at that time her dose of Lasix was significantly increased. She used to be 94 kg and now she is weighing 86 kg. It's possible that the patient may have  become hypovolemic. We will recommend decreasing the dose of her Lasix for now. She was ambulated in the hallway without any difficulty. She had an echocardiogram done recently in June. Report was reviewed. No clear indication to repeat one at this time.  History of chronic kidney disease stage III. Followed by Dr. Joelyn Oms. Was on high-dose Lasix. This could've caused her to become hypovolemic. Lasix dose has been decreased from 120 mg twice a day to 80 mg twice a day. She will follow-up with her nephrologist. She will need repeat renal function tests in the next week or so. She has an upcoming appointment with her new primary care provider on Friday. This could be done at that time as well.  Chronic diastolic CHF. Systolic function noted to be normal. Currently euvolemic and perhaps may have been slightly hypovolemic at admission. Currently stable.  History of essential hypertension. Stable. Continue home medications.  History of pleural effusion status post thoracentesis in June. Chest x-ray does not show any new accumulation.  History of diabetes mellitus type 2, anemia of chronic disease, hypothyroidism: All stable.  Discussed in detail with patient as well as her daughter. Okay for discharge today.   PERTINENT LABS:  The results of significant diagnostics from this hospitalization (including imaging, microbiology, ancillary and laboratory) are listed below for reference.    Microbiology: Recent Results (from the past 240 hour(s))  Culture, blood (routine x 2)     Status: None (Preliminary result)   Collection Time: 02/01/17 11:50 AM  Result Value Ref Range Status   Specimen Description BLOOD RIGHT ANTECUBITAL  Final   Special Requests   Final    BOTTLES DRAWN AEROBIC AND ANAEROBIC Blood Culture adequate volume   Culture NO GROWTH < 24 HOURS  Final   Report Status PENDING  Incomplete  Culture, blood (routine x 2)     Status: None (Preliminary result)   Collection Time: 02/01/17  11:58 AM  Result Value Ref Range Status   Specimen Description BLOOD RIGHT HAND  Final   Special Requests   Final    BOTTLES DRAWN AEROBIC AND ANAEROBIC Blood Culture adequate volume   Culture NO GROWTH < 24 HOURS  Final   Report Status PENDING  Incomplete     Labs: Basic Metabolic Panel:  Recent Labs Lab 02/01/17 1149 02/01/17 1404 02/01/17 1731 02/01/17 2318  NA 132*  --  136 133*  K 3.2*  --  3.3* 3.8  CL 93*  --  94* 94*  CO2 25  --  28 25  GLUCOSE 195*  --  119* 145*  BUN 85*  --  83* 83*  CREATININE 4.96*  --  4.61* 4.58*  CALCIUM 10.2  --  10.4* 9.6  MG  --  2.5*  --   --    Liver Function Tests:  Recent Labs Lab 02/01/17 1149  AST 21  ALT 15  ALKPHOS 53  BILITOT 0.4  PROT 6.8  ALBUMIN 3.9   CBC:  Recent Labs Lab 02/01/17 1149  WBC 7.3  NEUTROABS 5.5  HGB 9.9*  HCT 32.2*  MCV 92.5  PLT 306   BNP: BNP (last 3 results)  Recent Labs  01/04/17 1744 01/05/17 0357  BNP 899.1* 1,317.6*    CBG:  Recent Labs Lab 02/01/17 1619 02/01/17 2135 02/02/17 0616 02/02/17 0748 02/02/17 1146  GLUCAP 118* 163* 123* 101* 139*     IMAGING STUDIES Ct Abdomen Pelvis Wo Contrast  Result Date: 01/04/2017 CLINICAL DATA:  Dizziness.  Abdominal pain with nausea and vomiting. EXAM: CT CHEST, ABDOMEN AND PELVIS WITHOUT CONTRAST TECHNIQUE: Multidetector CT imaging of the chest, abdomen and pelvis was performed following the standard protocol without IV contrast. COMPARISON:  None. FINDINGS: CT CHEST FINDINGS Cardiovascular: Cardiomegaly with small volume pericardial fluid. Enlarged main pulmonary artery at 4 cm, usually seen with pulmonary hypertension. Diffuse atherosclerotic calcification of the coronaries. Multifocal aortic and great vessel calcification. No acute vascular finding. Mediastinum/Nodes: Hilar enlargement it appears vascular. No adenopathy noted. Negative esophagus. Lungs/Pleura: Moderate layering pleural effusions, greater on the right. Multi  segment atelectasis. The aerated lung shows no edema. No pneumothorax. Musculoskeletal: See below CT ABDOMEN PELVIS FINDINGS Hepatobiliary: No focal liver abnormality.Subtle layering calculi. No inflammatory changes in the biliary tree. Normal common bile duct diameter. Pancreas: Unremarkable. Spleen: Unremarkable. Adrenals/Urinary Tract: Negative adrenals. No hydronephrosis or stone. Hilar calcifications appear atherosclerotic. Unremarkable bladder. Stomach/Bowel: No obstruction. No appendicitis. Extensive sigmoid diverticulosis. Vascular/Lymphatic: Diffuse atherosclerotic calcification. No noted adenopathy. No mass or adenopathy. Reproductive:Fibroid uterus with 4 cm calcified/ hyalinized fibroid. Other: No ascites or pneumoperitoneum.  Anasarca. Musculoskeletal: No acute finding. Severe lumbar facet arthropathy. Status post L4-5 fusion. L5-S1 ankylosis or fusion. Extensive thoracic spondylosis with multi-level ankylosis. Advanced glenohumeral osteoarthritis. Due to patient size, some of the abdominal wall is not visible. IMPRESSION: 1. Cardiomegaly with volume overload. Moderate right and small left pleural effusions with multi segment atelectasis. Extensive anasarca. 2. Probable pulmonary hypertension. 3. Extensive colonic diverticulosis. 4. Cholelithiasis. Electronically Signed   By: Monte Fantasia M.D.   On: 01/04/2017 20:04   Dg Chest 1 View  Result Date:  01/10/2017 CLINICAL DATA:  Status post thoracentesis. EXAM: CHEST 1 VIEW COMPARISON:  Chest radiograph January 10, 2017 at 0938 hours FINDINGS: Similar blunting of LEFT costophrenic angle, RIGHT hemidiaphragm is no longer elevated, no RIGHT pleural effusion. Moderate cardiomegaly. Calcified aortic knob. Mild pulmonary vascular congestion and mild interstitial prominence. RIGHT lung atelectasis. No pneumothorax. Soft tissue planes and included osseous structures are nonsuspicious. Severe glenohumeral osteoarthrosis. IMPRESSION: Small LEFT pleural effusion  versus pleural thickening, resolved RIGHT pleural effusion by AP radiograph. No pneumothorax. Stable cardiomegaly and interstitial prominence most compatible pulmonary edema. Electronically Signed   By: Elon Alas M.D.   On: 01/10/2017 16:13   Dg Chest 2 View  Result Date: 02/01/2017 CLINICAL DATA:  witnessed syncopal episode while at renal office. Pt was seated, did not fall; out < 30 seconds; per EMS, pt was pale VSS, no orthostatic changes; CBG: 232 Pt was admitted x 1 week ago for low hemoglobin. EXAM: CHEST - 2 VIEW COMPARISON:  01/10/2017 FINDINGS: Lungs are clear. Heart size upper limits normal.  Atheromatous aorta. No effusion.  No pneumothorax. Degenerative changes in bilateral shoulders. Anterior vertebral endplate spurring at multiple levels in the mid and lower thoracic spine. IMPRESSION: Mild cardiomegaly.  No acute findings. Electronically Signed   By: Lucrezia Europe M.D.   On: 02/01/2017 12:28   Dg Chest 2 View  Result Date: 01/10/2017 CLINICAL DATA:  CHF, right lower lobe atelectasis or pneumonia, possible pleural effusion. EXAM: CHEST  2 VIEW COMPARISON:  PA and lateral chest x-ray of January 07, 2017 and chest CT scan of January 04, 2017. FINDINGS: The cardiac silhouette remains enlarged. There small bilateral pleural effusions greatest on the right. Density at the right lung base persists. The pulmonary vascularity is mildly engorged. IMPRESSION: CHF with right basilar atelectasis or pneumonia. Bilateral pleural effusions which have increased slightly in volume since the previous study. Electronically Signed   By: David  Martinique M.D.   On: 01/10/2017 09:56   Dg Chest 2 View  Result Date: 01/07/2017 CLINICAL DATA:  Chronic diastolic congestive heart failure. EXAM: CHEST  2 VIEW COMPARISON:  CT chest 01/04/2017 and chest radiograph 01/11/2015. FINDINGS: Trachea is midline. Heart is enlarged. Basilar dependent mixed interstitial and airspace opacification with consolidation in the right lower  lobe. Small bilateral pleural effusions. Degenerative changes in shoulders. IMPRESSION: 1. Congestive heart failure. 2. Consolidation in the right lower lobe. Pneumonia cannot be excluded. Electronically Signed   By: Lorin Picket M.D.   On: 01/07/2017 07:38   Ct Head Wo Contrast  Result Date: 02/01/2017 CLINICAL DATA:  Syncope EXAM: CT HEAD WITHOUT CONTRAST TECHNIQUE: Contiguous axial images were obtained from the base of the skull through the vertex without intravenous contrast. COMPARISON:  Brain MRI and January 13, 2014 and head CT January 04, 2017 FINDINGS: Brain: The ventricles are normal in size and configuration. There is moderate frontal atrophy bilaterally, more severe on the left than on the right, stable. There is also moderate temporal lobe atrophy bilaterally, stable. There is no intracranial mass, hemorrhage, extra-axial fluid collection, or midline shift. There is patchy small vessel disease in the centra semiovale bilaterally. Elsewhere gray-white compartments appear normal. No acute infarct evident. Vascular: No hyperdense vessel. There is calcification in each distal vertebral artery as well as in each carotid siphon region. Skull: The bony calvarium appears intact. Sinuses/Orbits: There is mucosal thickening in several ethmoid air cells bilaterally. Visualized orbits appear symmetric bilaterally. Other: Mastoid air cells are clear. IMPRESSION: Stable atrophy with patchy supratentorial small  vessel disease. No intracranial mass, hemorrhage, or extra-axial fluid collection. No acute infarct evident. Areas of arterial vascular calcification. Mucosal thickening in several ethmoid air cells bilaterally. Electronically Signed   By: Lowella Grip III M.D.   On: 02/01/2017 12:51   Ct Head Wo Contrast  Result Date: 01/04/2017 CLINICAL DATA:  Dizziness and weakness. EXAM: CT HEAD WITHOUT CONTRAST TECHNIQUE: Contiguous axial images were obtained from the base of the skull through the vertex without  intravenous contrast. COMPARISON:  10/12/2013 head CT, MRI 01/13/2014 FINDINGS: Brain: Chronic moderate superficial atrophy with small vessel ischemic disease of periventricular and subcortical white matter. No acute intracranial hemorrhage, midline shift or edema. No extra-axial fluid collections. The basal cisterns and fourth ventricle are midline. Vascular: Atherosclerotic calcifications of the vertebral arteries and cavernous sinus carotids. No hyperdense vessels. Skull: Negative for fracture or focal lesions. Sinuses/Orbits: No acute finding. Other: None. IMPRESSION: Chronic moderate superficial atrophy with small vessel ischemic disease. No acute intracranial abnormality. Electronically Signed   By: Ashley Royalty M.D.   On: 01/04/2017 19:47   Ct Chest Wo Contrast  Result Date: 01/04/2017 CLINICAL DATA:  Dizziness.  Abdominal pain with nausea and vomiting. EXAM: CT CHEST, ABDOMEN AND PELVIS WITHOUT CONTRAST TECHNIQUE: Multidetector CT imaging of the chest, abdomen and pelvis was performed following the standard protocol without IV contrast. COMPARISON:  None. FINDINGS: CT CHEST FINDINGS Cardiovascular: Cardiomegaly with small volume pericardial fluid. Enlarged main pulmonary artery at 4 cm, usually seen with pulmonary hypertension. Diffuse atherosclerotic calcification of the coronaries. Multifocal aortic and great vessel calcification. No acute vascular finding. Mediastinum/Nodes: Hilar enlargement it appears vascular. No adenopathy noted. Negative esophagus. Lungs/Pleura: Moderate layering pleural effusions, greater on the right. Multi segment atelectasis. The aerated lung shows no edema. No pneumothorax. Musculoskeletal: See below CT ABDOMEN PELVIS FINDINGS Hepatobiliary: No focal liver abnormality.Subtle layering calculi. No inflammatory changes in the biliary tree. Normal common bile duct diameter. Pancreas: Unremarkable. Spleen: Unremarkable. Adrenals/Urinary Tract: Negative adrenals. No hydronephrosis  or stone. Hilar calcifications appear atherosclerotic. Unremarkable bladder. Stomach/Bowel: No obstruction. No appendicitis. Extensive sigmoid diverticulosis. Vascular/Lymphatic: Diffuse atherosclerotic calcification. No noted adenopathy. No mass or adenopathy. Reproductive:Fibroid uterus with 4 cm calcified/ hyalinized fibroid. Other: No ascites or pneumoperitoneum.  Anasarca. Musculoskeletal: No acute finding. Severe lumbar facet arthropathy. Status post L4-5 fusion. L5-S1 ankylosis or fusion. Extensive thoracic spondylosis with multi-level ankylosis. Advanced glenohumeral osteoarthritis. Due to patient size, some of the abdominal wall is not visible. IMPRESSION: 1. Cardiomegaly with volume overload. Moderate right and small left pleural effusions with multi segment atelectasis. Extensive anasarca. 2. Probable pulmonary hypertension. 3. Extensive colonic diverticulosis. 4. Cholelithiasis. Electronically Signed   By: Monte Fantasia M.D.   On: 01/04/2017 20:04   US Abdomen Limited Ruq  Result Date: 01/05/2017 CLINICAL DATA:  Abnormal liver functions EXAM: ULTRASOUND ABDOMEN LIMITED RIGHT UPPER QUADRANT COMPARISON:  CT scan October 05, 2014 FINDINGS: Gallbladder: Sludge is seen in the gallbladder with no shadowing to definitively suggest stones. No Murphy's sign or pericholecystic fluid. The gallbladder wall measures up to 3.5 mm. Common bile duct: Diameter: 9 mm Liver: An echogenic mass in the liver measures up to 16 mm. No definitive correlate on the October 05, 2014 CT scan. Incidentally, a right pleural effusion is identified. IMPRESSION: 1. Significant sludge in the gallbladder. No definitive stones are visualized today but suspected small stones were seen on a CT scan in 2016. The gallbladder wall is mildly thickened. No Murphy's sign or pericholecystic fluid identified. 2. The common bile duct is dilated  measuring up to 9 mm. Consider an MRCP to evaluate for choledocholithiasis. Electronically Signed   By:  Dorise Bullion III M.D   On: 01/05/2017 18:58   Ir Thoracentesis Asp Pleural Space W/img Guide  Result Date: 01/11/2017 INDICATION: Patient with history of CHF and right pleural effusion. Request is made for right thoracentesis. EXAM: ULTRASOUND GUIDED THERAPEUTIC RIGHT THORACENTESIS MEDICATIONS: 10 mL 1% lidocaine COMPLICATIONS: None immediate. PROCEDURE: An ultrasound guided thoracentesis was thoroughly discussed with the patient and questions answered. The benefits, risks, alternatives and complications were also discussed. The patient understands and wishes to proceed with the procedure. Written consent was obtained. Ultrasound was performed to localize and mark an adequate pocket of fluid in the right chest. The area was then prepped and draped in the normal sterile fashion. 1% Lidocaine was used for local anesthesia. Under ultrasound guidance a Safe-T-centesis catheter was introduced. Thoracentesis was performed. The catheter was removed and a dressing applied. FINDINGS: A total of approximately 925 mL of clear, yellow fluid was removed. IMPRESSION: Successful ultrasound guided therapeutic right thoracentesis yielding 925 mL of pleural fluid. Read by:  Brynda Greathouse PA-C Electronically Signed   By: Marybelle Killings M.D.   On: 01/10/2017 17:03    DISCHARGE EXAMINATION: Vitals:   02/01/17 2007 02/02/17 0025 02/02/17 0611 02/02/17 1147  BP: (!) 131/54 (!) 137/55 (!) 156/69 (!) 148/60  Pulse: 83 77 76 69  Resp: _0 Temp: 98.5 F (36.9 C) 97.9 F (36.6 C) 98.2 F (36.8 C) 98.4 F (36.9 C)  TempSrc: Oral Oral Oral Oral  SpO2: 97% 100% 98% 100%  Weight:   86.4 kg (190 lb 7.6 oz)   Height:       General appearance: alert, cooperative, appears stated age and no distress Resp: clear to auscultation bilaterally Cardio: regular rate and rhythm, S1, S2 normal, no murmur, click, rub or gallop GI: soft, non-tender; bowel sounds normal; no masses,  no organomegaly Extremities: extremities  normal, atraumatic, no cyanosis or edema No focal neurological deficits.  DISPOSITION: Home with daughter  Discharge Instructions    Call MD for:  difficulty breathing, headache or visual disturbances    Complete by:  As directed    Call MD for:  extreme fatigue    Complete by:  As directed    Call MD for:  persistant dizziness or light-headedness    Complete by:  As directed    Call MD for:  persistant nausea and vomiting    Complete by:  As directed    Call MD for:  severe uncontrolled pain    Complete by:  As directed    Call MD for:  temperature >100.4    Complete by:  As directed    Discharge instructions    Complete by:  As directed    You will need to undergo blood work in the next week or so to check your kidney function. This can be done when you go to see your new primary care provider next week. Please take the lower dose of Lasix as recommended. Follow-up with primary care provider as previously scheduled. Please check weight daily and if there is gain of 2 pounds or more, please call Dr. Adin Hector office. Please seek attention immediately if you pass out again. A referral has been sent to [facility] to schedule outpatient EEG.  You were cared for by a hospitalist during your hospital stay. If you have any questions about your discharge medications or the care you  received while you were in the hospital after you are discharged, you can call the unit and asked to speak with the hospitalist on call if the hospitalist that took care of you is not available. Once you are discharged, your primary care physician will handle any further medical issues. Please note that NO REFILLS for any discharge medications will be authorized once you are discharged, as it is imperative that you return to your primary care physician (or establish a relationship with a primary care physician if you do not have one) for your aftercare needs so that they can reassess your need for  medications and monitor your lab values. If you do not have a primary care physician, you can call [phone] for a physician referral.   Increase activity slowly    Complete by:  As directed       ALLERGIES:  Allergies  Allergen Reactions  . Ciprofloxacin Hcl Diarrhea  . Cymbalta [Duloxetine Hcl] Other (See Comments)    Per pt: it did not work  . Lipitor [Atorvastatin] Other (See Comments)    Makes her feel "not right."  . Xanax [Alprazolam] Other (See Comments)    Excessive sleepiness  . Codeine Other (See Comments)    Per pt: unknown  . Lortab [Hydrocodone-Acetaminophen] Other (See Comments)    Per pt: unknown  . Oxycodone Other (See Comments)    Per pt: unknown      Discharge Medication List as of 02/02/2017  1:02 PM    CONTINUE these medications which have CHANGED   Details  furosemide (LASIX) 40 MG tablet Take 2 tablets (80 mg total) by mouth 2 (two) times daily., Starting Sat 02/02/2017, Print      CONTINUE these medications which have NOT CHANGED   Details  ACCU-CHEK AVIVA PLUS test strip USE TO TEST BLOOD SUGAR THREE TIMES DAILY, Normal    acetaminophen (TYLENOL) 650 MG CR tablet Take 1,300-1,950 mg by mouth 2 (two) times daily as needed for pain., Historical Med    aspirin 81 MG tablet Take 81 mg by mouth daily., Historical Med    atorvastatin (LIPITOR) 80 MG tablet TAKE 1 TABLET(80 MG) BY MOUTH DAILY, Normal    Blood Glucose Monitoring Suppl (ACCU-CHEK AVIVA PLUS) W/DEVICE KIT Starting Mon 09/13/2014, Historical Med    carvedilol (COREG) 3.125 MG tablet Take 1 tablet (3.125 mg total) by mouth 2 (two) times daily with a meal., Starting Sat 01/12/2017, Normal    Cholecalciferol (VITAMIN D-3 PO) Take 5,000 Units by mouth every evening. , Historical Med    Cyanocobalamin (VITAMIN B 12 PO) Take 1,000 mcg by mouth every morning. , Historical Med    ferrous sulfate 325 (65 FE) MG tablet Take 325 mg by mouth daily with breakfast., Historical Med    glipiZIDE  (GLUCOTROL) 5 MG tablet Take 0.5 tablets (2.5 mg total) by mouth daily before breakfast., Starting Sat 01/12/2017, Until Sun 01/12/2018, Normal    insulin glargine (LANTUS) 100 UNIT/ML injection Inject 0.05 mLs (5 Units total) into the skin at bedtime., Starting Sat 01/12/2017, Normal    isosorbide-hydrALAZINE (BIDIL) 20-37.5 MG tablet Take 1 tablet by mouth 3 (three) times daily., Starting Sat 01/12/2017, Normal    Lancets (FREESTYLE) lancets Use as instructed, Normal    latanoprost (XALATAN) 0.005 % ophthalmic solution Place 1 drop into both eyes every evening. , Starting Wed 03/31/2014, Historical Med    levothyroxine (SYNTHROID, LEVOTHROID) 75 MCG tablet Take 1 tablet (75 mcg total) by mouth daily before breakfast., Starting Sun 01/13/2017,  Normal    loratadine (CLARITIN) 10 MG tablet Take 10 mg by mouth every evening., Historical Med    minoxidil (LONITEN) 10 MG tablet TAKE 1/2 TO 1 TABLET BY MOUTH DAILY AS DIRECTED FOR BLOOD PRESSURE, Normal    Multiple Vitamin (MULTIVITAMIN) tablet Take 1 tablet by mouth daily., Historical Med    Omega-3 Fatty Acids (FISH OIL) 1000 MG CAPS Take 1,000 mg by mouth every evening. , Historical Med    timolol (TIMOPTIC) 0.5 % ophthalmic solution INSTILL 1 DROP IN BOTH EYES TWICE DAILY, Normal         Follow-up Information    Tisovec, Fransico Him, MD Follow up.   Specialty:  Internal Medicine Why:  as previously scheduled. Contact information: [address] [place] [address] [phone]           TOTAL DISCHARGE TIME: 35 minutes  [facility] Pager [phone]  02/02/2017, 3:28 PM

## 2017-02-02 NOTE — Progress Notes (Signed)
Patient refused eyedrops, says eyes are irritated and still a little swollen.

## 2017-02-02 NOTE — Progress Notes (Deleted)
Patient requests prayer.

## 2017-02-02 NOTE — Progress Notes (Signed)
Patient refuses bed alarm, she will call before she gets up.

## 2017-02-02 NOTE — Progress Notes (Signed)
Patient not tolerating potassium runs, she says her arm is burning and to switch fluids to her other IV. Switched fluids and she said that her arm is burning again. Patient crying. Potassium and saline stopped, MD called- agree to hold potassium and try PO potassium after swallow eval.

## 2017-02-02 NOTE — Progress Notes (Signed)
Orthostatic BP negative, pt ambulated in a hallway without having distress and pain. Try to call EEG, left the voice message since call was not being received

## 2017-02-06 ENCOUNTER — Other Ambulatory Visit: Payer: Self-pay

## 2017-02-06 LAB — CULTURE, BLOOD (ROUTINE X 2)
Culture: NO GROWTH
Culture: NO GROWTH
SPECIAL REQUESTS: ADEQUATE
Special Requests: ADEQUATE

## 2017-02-06 NOTE — Patient Outreach (Signed)
[facility]) Care Management  02/06/17  KODIE PICK 04/30/1946 [id number]  Patient had a recent observation stay at hospital 7/13-7/14/2018 for syncopal episode.  Second attempt to reach patient and her daughter Amy without success. Left HIPAA compliant voicemail with RNCM contact information and invited callback.  Plan: Will make one more outreach attempt and send unsuccessful outreach letter if no further engagement with patient/her daughter.   [place] R. Westlynn Fifer, RN, BSN, [place] Management Coordinator [phone]

## 2017-02-08 ENCOUNTER — Other Ambulatory Visit: Payer: Self-pay

## 2017-02-08 DIAGNOSIS — I509 Heart failure, unspecified: Secondary | ICD-10-CM | POA: Diagnosis not present

## 2017-02-08 DIAGNOSIS — I10 Essential (primary) hypertension: Secondary | ICD-10-CM | POA: Diagnosis not present

## 2017-02-08 DIAGNOSIS — E7800 Pure hypercholesterolemia, unspecified: Secondary | ICD-10-CM | POA: Diagnosis not present

## 2017-02-08 DIAGNOSIS — Z1389 Encounter for screening for other disorder: Secondary | ICD-10-CM | POA: Diagnosis not present

## 2017-02-08 DIAGNOSIS — E119 Type 2 diabetes mellitus without complications: Secondary | ICD-10-CM | POA: Diagnosis not present

## 2017-02-08 NOTE — Telephone Encounter (Signed)
This encounter was created in error - please disregard.

## 2017-02-08 NOTE — Patient Outreach (Signed)
[facility]) Care Management  02/08/17  Alyssa [name] [birth year] [id number]   RNCM received voicemail from patient's daughter [name] providing a work number for her [phone] x [phone]). Third attempt made to contact patient/daughter without success at both cell and work numbers. Will make one more attempt next week and close if no successful contact made.  [name], RN, BSN, [place] Management Coordinator [phone]

## 2017-02-09 ENCOUNTER — Inpatient Hospital Stay (HOSPITAL_COMMUNITY)
Admission: EM | Admit: 2017-02-09 | Discharge: 2017-02-22 | DRG: 391 | Disposition: A | Discharge status: Hospice | Payer: Medicare Other | Attending: Internal Medicine | Admitting: Internal Medicine

## 2017-02-09 ENCOUNTER — Emergency Department (HOSPITAL_COMMUNITY): Payer: Medicare Other

## 2017-02-09 ENCOUNTER — Encounter (HOSPITAL_COMMUNITY): Payer: Self-pay

## 2017-02-09 ENCOUNTER — Inpatient Hospital Stay (HOSPITAL_COMMUNITY): Payer: Medicare Other

## 2017-02-09 DIAGNOSIS — M545 Low back pain: Secondary | ICD-10-CM | POA: Diagnosis present

## 2017-02-09 DIAGNOSIS — E119 Type 2 diabetes mellitus without complications: Secondary | ICD-10-CM

## 2017-02-09 DIAGNOSIS — E876 Hypokalemia: Secondary | ICD-10-CM | POA: Diagnosis not present

## 2017-02-09 DIAGNOSIS — G8929 Other chronic pain: Secondary | ICD-10-CM | POA: Diagnosis present

## 2017-02-09 DIAGNOSIS — Z794 Long term (current) use of insulin: Secondary | ICD-10-CM

## 2017-02-09 DIAGNOSIS — I129 Hypertensive chronic kidney disease with stage 1 through stage 4 chronic kidney disease, or unspecified chronic kidney disease: Secondary | ICD-10-CM | POA: Diagnosis present

## 2017-02-09 DIAGNOSIS — I10 Essential (primary) hypertension: Secondary | ICD-10-CM

## 2017-02-09 DIAGNOSIS — D649 Anemia, unspecified: Secondary | ICD-10-CM | POA: Diagnosis present

## 2017-02-09 DIAGNOSIS — K5732 Diverticulitis of large intestine without perforation or abscess without bleeding: Secondary | ICD-10-CM | POA: Diagnosis not present

## 2017-02-09 DIAGNOSIS — R0902 Hypoxemia: Secondary | ICD-10-CM

## 2017-02-09 DIAGNOSIS — K5720 Diverticulitis of large intestine with perforation and abscess without bleeding: Principal | ICD-10-CM | POA: Diagnosis present

## 2017-02-09 DIAGNOSIS — K5792 Diverticulitis of intestine, part unspecified, without perforation or abscess without bleeding: Secondary | ICD-10-CM

## 2017-02-09 DIAGNOSIS — I509 Heart failure, unspecified: Secondary | ICD-10-CM

## 2017-02-09 DIAGNOSIS — K631 Perforation of intestine (nontraumatic): Secondary | ICD-10-CM

## 2017-02-09 DIAGNOSIS — E1122 Type 2 diabetes mellitus with diabetic chronic kidney disease: Secondary | ICD-10-CM | POA: Diagnosis present

## 2017-02-09 DIAGNOSIS — E039 Hypothyroidism, unspecified: Secondary | ICD-10-CM | POA: Diagnosis not present

## 2017-02-09 DIAGNOSIS — K5730 Diverticulosis of large intestine without perforation or abscess without bleeding: Secondary | ICD-10-CM | POA: Diagnosis not present

## 2017-02-09 DIAGNOSIS — I5032 Chronic diastolic (congestive) heart failure: Secondary | ICD-10-CM | POA: Diagnosis present

## 2017-02-09 DIAGNOSIS — K651 Peritoneal abscess: Secondary | ICD-10-CM | POA: Diagnosis not present

## 2017-02-09 DIAGNOSIS — Z9842 Cataract extraction status, left eye: Secondary | ICD-10-CM

## 2017-02-09 DIAGNOSIS — M810 Age-related osteoporosis without current pathological fracture: Secondary | ICD-10-CM | POA: Diagnosis present

## 2017-02-09 DIAGNOSIS — Z7189 Other specified counseling: Secondary | ICD-10-CM | POA: Diagnosis not present

## 2017-02-09 DIAGNOSIS — R0602 Shortness of breath: Secondary | ICD-10-CM | POA: Diagnosis not present

## 2017-02-09 DIAGNOSIS — N185 Chronic kidney disease, stage 5: Secondary | ICD-10-CM

## 2017-02-09 DIAGNOSIS — H409 Unspecified glaucoma: Secondary | ICD-10-CM | POA: Diagnosis not present

## 2017-02-09 DIAGNOSIS — R112 Nausea with vomiting, unspecified: Secondary | ICD-10-CM | POA: Diagnosis not present

## 2017-02-09 DIAGNOSIS — R251 Tremor, unspecified: Secondary | ICD-10-CM | POA: Diagnosis present

## 2017-02-09 DIAGNOSIS — Z96653 Presence of artificial knee joint, bilateral: Secondary | ICD-10-CM | POA: Diagnosis not present

## 2017-02-09 DIAGNOSIS — N183 Chronic kidney disease, stage 3 unspecified: Secondary | ICD-10-CM | POA: Diagnosis present

## 2017-02-09 DIAGNOSIS — R627 Adult failure to thrive: Secondary | ICD-10-CM | POA: Diagnosis not present

## 2017-02-09 DIAGNOSIS — E1140 Type 2 diabetes mellitus with diabetic neuropathy, unspecified: Secondary | ICD-10-CM | POA: Diagnosis present

## 2017-02-09 DIAGNOSIS — R072 Precordial pain: Secondary | ICD-10-CM | POA: Diagnosis not present

## 2017-02-09 DIAGNOSIS — I214 Non-ST elevation (NSTEMI) myocardial infarction: Secondary | ICD-10-CM | POA: Diagnosis not present

## 2017-02-09 DIAGNOSIS — Z515 Encounter for palliative care: Secondary | ICD-10-CM | POA: Diagnosis not present

## 2017-02-09 DIAGNOSIS — F324 Major depressive disorder, single episode, in partial remission: Secondary | ICD-10-CM | POA: Diagnosis present

## 2017-02-09 DIAGNOSIS — F319 Bipolar disorder, unspecified: Secondary | ICD-10-CM | POA: Diagnosis not present

## 2017-02-09 DIAGNOSIS — Z7982 Long term (current) use of aspirin: Secondary | ICD-10-CM

## 2017-02-09 DIAGNOSIS — Z6839 Body mass index (BMI) 39.0-39.9, adult: Secondary | ICD-10-CM

## 2017-02-09 DIAGNOSIS — D631 Anemia in chronic kidney disease: Secondary | ICD-10-CM | POA: Diagnosis not present

## 2017-02-09 DIAGNOSIS — Z961 Presence of intraocular lens: Secondary | ICD-10-CM | POA: Diagnosis not present

## 2017-02-09 DIAGNOSIS — R109 Unspecified abdominal pain: Secondary | ICD-10-CM | POA: Diagnosis not present

## 2017-02-09 DIAGNOSIS — E1121 Type 2 diabetes mellitus with diabetic nephropathy: Secondary | ICD-10-CM | POA: Diagnosis not present

## 2017-02-09 DIAGNOSIS — K2970 Gastritis, unspecified, without bleeding: Secondary | ICD-10-CM | POA: Diagnosis not present

## 2017-02-09 DIAGNOSIS — I259 Chronic ischemic heart disease, unspecified: Secondary | ICD-10-CM | POA: Diagnosis not present

## 2017-02-09 DIAGNOSIS — R079 Chest pain, unspecified: Secondary | ICD-10-CM | POA: Diagnosis not present

## 2017-02-09 DIAGNOSIS — I200 Unstable angina: Secondary | ICD-10-CM | POA: Diagnosis not present

## 2017-02-09 DIAGNOSIS — N189 Chronic kidney disease, unspecified: Secondary | ICD-10-CM | POA: Diagnosis not present

## 2017-02-09 DIAGNOSIS — Z79899 Other long term (current) drug therapy: Secondary | ICD-10-CM

## 2017-02-09 DIAGNOSIS — E785 Hyperlipidemia, unspecified: Secondary | ICD-10-CM | POA: Diagnosis present

## 2017-02-09 DIAGNOSIS — Z885 Allergy status to narcotic agent status: Secondary | ICD-10-CM

## 2017-02-09 DIAGNOSIS — Z888 Allergy status to other drugs, medicaments and biological substances status: Secondary | ICD-10-CM

## 2017-02-09 DIAGNOSIS — Z66 Do not resuscitate: Secondary | ICD-10-CM

## 2017-02-09 DIAGNOSIS — I25110 Atherosclerotic heart disease of native coronary artery with unstable angina pectoris: Secondary | ICD-10-CM | POA: Diagnosis not present

## 2017-02-09 LAB — CBC WITH DIFFERENTIAL/PLATELET
Basophils Absolute: 0 K/uL (ref 0.0–0.1)
Basophils Relative: 0 %
Eosinophils Absolute: 0 K/uL (ref 0.0–0.7)
Eosinophils Relative: 0 %
HCT: 29.9 % — ABNORMAL LOW (ref 36.0–46.0)
Hemoglobin: 9.8 g/dL — ABNORMAL LOW (ref 12.0–15.0)
Lymphocytes Relative: 6 %
Lymphs Abs: 0.5 K/uL — ABNORMAL LOW (ref 0.7–4.0)
MCH: 28.6 pg (ref 26.0–34.0)
MCHC: 32.8 g/dL (ref 30.0–36.0)
MCV: 87.2 fL (ref 78.0–100.0)
Monocytes Absolute: 0.3 K/uL (ref 0.1–1.0)
Monocytes Relative: 4 %
Neutro Abs: 7.5 K/uL (ref 1.7–7.7)
Neutrophils Relative %: 90 %
Platelets: 298 K/uL (ref 150–400)
RBC: 3.43 MIL/uL — ABNORMAL LOW (ref 3.87–5.11)
RDW: 14.3 % (ref 11.5–15.5)
WBC: 8.3 K/uL (ref 4.0–10.5)

## 2017-02-09 LAB — GLUCOSE, CAPILLARY
GLUCOSE-CAPILLARY: 109 mg/dL — AB (ref 65–99)
Glucose-Capillary: 163 mg/dL — ABNORMAL HIGH (ref 65–99)

## 2017-02-09 LAB — COMPREHENSIVE METABOLIC PANEL
ALBUMIN: 3.3 g/dL — AB (ref 3.5–5.0)
ALT: 16 U/L (ref 14–54)
ANION GAP: 12 (ref 5–15)
AST: 22 U/L (ref 15–41)
Alkaline Phosphatase: 64 U/L (ref 38–126)
BILIRUBIN TOTAL: 0.7 mg/dL (ref 0.3–1.2)
BUN: 70 mg/dL — AB (ref 6–20)
CO2: 23 mmol/L (ref 22–32)
Calcium: 9.9 mg/dL (ref 8.9–10.3)
Chloride: 96 mmol/L — ABNORMAL LOW (ref 101–111)
Creatinine, Ser: 4.25 mg/dL — ABNORMAL HIGH (ref 0.44–1.00)
GFR calc Af Amer: 11 mL/min — ABNORMAL LOW (ref >60)
GFR calc non Af Amer: 10 mL/min — ABNORMAL LOW (ref >60)
GLUCOSE: 217 mg/dL — AB (ref 65–99)
POTASSIUM: 3.6 mmol/L (ref 3.5–5.1)
Sodium: 131 mmol/L — ABNORMAL LOW (ref 135–145)
TOTAL PROTEIN: 6.5 g/dL (ref 6.5–8.1)

## 2017-02-09 LAB — LACTIC ACID, PLASMA
LACTIC ACID, VENOUS: 1 mmol/L (ref 0.5–1.9)
LACTIC ACID, VENOUS: 1.2 mmol/L (ref 0.5–1.9)

## 2017-02-09 LAB — CREATININE, SERUM
Creatinine, Ser: 4.28 mg/dL — ABNORMAL HIGH (ref 0.44–1.00)
GFR calc non Af Amer: 9 mL/min — ABNORMAL LOW (ref >60)
GFR, EST AFRICAN AMERICAN: 11 mL/min — AB (ref >60)

## 2017-02-09 LAB — LIPASE, BLOOD: Lipase: 22 U/L (ref 11–51)

## 2017-02-09 LAB — CG4 I-STAT (LACTIC ACID): Lactic Acid, Venous: 0.99 mmol/L (ref 0.5–1.9)

## 2017-02-09 MED ORDER — PIPERACILLIN-TAZOBACTAM 3.375 G IVPB 30 MIN
3.3750 g | Freq: Once | INTRAVENOUS | Status: AC
  Administered 2017-02-09: 3.375 g via INTRAVENOUS
  Filled 2017-02-09: qty 50

## 2017-02-09 MED ORDER — ONDANSETRON HCL 4 MG/2ML IJ SOLN: 4.0000 mg | Freq: Four times a day (QID) | INTRAMUSCULAR | Status: DC | PRN

## 2017-02-09 MED ORDER — CARVEDILOL 3.125 MG PO TABS
3.1250 mg | ORAL_TABLET | Freq: Two times a day (BID) | ORAL | Status: DC
  Administered 2017-02-09 – 2017-02-15 (×12): 3.125 mg via ORAL
  Filled 2017-02-09 (×12): qty 1

## 2017-02-09 MED ORDER — INSULIN GLARGINE 100 UNIT/ML ~~LOC~~ SOLN
5.0000 [IU] | Freq: Every day | SUBCUTANEOUS | Status: DC
  Administered 2017-02-09 – 2017-02-20 (×11): 5 [IU] via SUBCUTANEOUS
  Filled 2017-02-09 (×13): qty 0.05

## 2017-02-09 MED ORDER — MORPHINE SULFATE (PF) 4 MG/ML IV SOLN
2.0000 mg | INTRAVENOUS | Status: DC | PRN
  Administered 2017-02-09: 2 mg via INTRAVENOUS
  Filled 2017-02-09 (×2): qty 1

## 2017-02-09 MED ORDER — TIMOLOL MALEATE 0.5 % OP SOLN
1.0000 [drp] | Freq: Two times a day (BID) | OPHTHALMIC | Status: DC
  Administered 2017-02-09 – 2017-02-22 (×22): 1 [drp] via OPHTHALMIC
  Filled 2017-02-09 (×2): qty 5

## 2017-02-09 MED ORDER — MORPHINE SULFATE (PF) 4 MG/ML IV SOLN
4.0000 mg | INTRAVENOUS | Status: DC | PRN
  Administered 2017-02-09 – 2017-02-18 (×5): 4 mg via INTRAVENOUS
  Filled 2017-02-09 (×6): qty 1

## 2017-02-09 MED ORDER — HEPARIN SODIUM (PORCINE) 5000 UNIT/ML IJ SOLN
5000.0000 [IU] | Freq: Three times a day (TID) | INTRAMUSCULAR | Status: AC
  Administered 2017-02-09 (×2): 5000 [IU] via SUBCUTANEOUS
  Filled 2017-02-09 (×2): qty 1

## 2017-02-09 MED ORDER — LEVOTHYROXINE SODIUM 75 MCG PO TABS
75.0000 ug | ORAL_TABLET | Freq: Every day | ORAL | Status: DC
  Administered 2017-02-10 – 2017-02-18 (×8): 75 ug via ORAL
  Filled 2017-02-09 (×9): qty 1

## 2017-02-09 MED ORDER — ONDANSETRON HCL 4 MG PO TABS
4.0000 mg | ORAL_TABLET | Freq: Four times a day (QID) | ORAL | Status: DC | PRN
  Administered 2017-02-15: 4 mg via ORAL
  Filled 2017-02-09: qty 1

## 2017-02-09 MED ORDER — INSULIN ASPART 100 UNIT/ML ~~LOC~~ SOLN
0.0000 [IU] | Freq: Every day | SUBCUTANEOUS | Status: DC
  Administered 2017-02-16: 0 [IU] via SUBCUTANEOUS
  Administered 2017-02-17: 2 [IU] via SUBCUTANEOUS

## 2017-02-09 MED ORDER — INSULIN ASPART 100 UNIT/ML ~~LOC~~ SOLN
0.0000 [IU] | Freq: Three times a day (TID) | SUBCUTANEOUS | Status: DC
  Administered 2017-02-09 – 2017-02-10 (×2): 3 [IU] via SUBCUTANEOUS
  Administered 2017-02-12 (×2): 2 [IU] via SUBCUTANEOUS
  Administered 2017-02-13: 3 [IU] via SUBCUTANEOUS
  Administered 2017-02-14 (×2): 2 [IU] via SUBCUTANEOUS
  Administered 2017-02-14 – 2017-02-15 (×2): 3 [IU] via SUBCUTANEOUS
  Administered 2017-02-15: 2 [IU] via SUBCUTANEOUS
  Administered 2017-02-15 – 2017-02-17 (×2): 3 [IU] via SUBCUTANEOUS
  Administered 2017-02-18: 2 [IU] via SUBCUTANEOUS
  Administered 2017-02-18: 3 [IU] via SUBCUTANEOUS
  Administered 2017-02-18 – 2017-02-19 (×2): 2 [IU] via SUBCUTANEOUS
  Administered 2017-02-19: 5 [IU] via SUBCUTANEOUS
  Administered 2017-02-19: 11 [IU] via SUBCUTANEOUS
  Administered 2017-02-20: 2 [IU] via SUBCUTANEOUS
  Administered 2017-02-20: 3 [IU] via SUBCUTANEOUS
  Administered 2017-02-20: 1 [IU] via SUBCUTANEOUS
  Administered 2017-02-21: 8 [IU] via SUBCUTANEOUS

## 2017-02-09 MED ORDER — MORPHINE SULFATE (PF) 4 MG/ML IV SOLN
4.0000 mg | Freq: Once | INTRAVENOUS | Status: AC
  Administered 2017-02-09: 4 mg via INTRAVENOUS
  Filled 2017-02-09: qty 1

## 2017-02-09 MED ORDER — SODIUM CHLORIDE 0.9 % IV SOLN
INTRAVENOUS | Status: AC
  Administered 2017-02-09: 15:00:00 via INTRAVENOUS

## 2017-02-09 MED ORDER — FLEET ENEMA 7-19 GM/118ML RE ENEM: 1.0000 | ENEMA | Freq: Once | RECTAL | Status: DC | PRN

## 2017-02-09 MED ORDER — DIPHENHYDRAMINE HCL 25 MG PO CAPS
25.0000 mg | ORAL_CAPSULE | Freq: Four times a day (QID) | ORAL | Status: DC | PRN
  Administered 2017-02-09 – 2017-02-20 (×7): 25 mg via ORAL
  Filled 2017-02-09 (×7): qty 1

## 2017-02-09 MED ORDER — LATANOPROST 0.005 % OP SOLN
1.0000 [drp] | Freq: Every evening | OPHTHALMIC | Status: DC
  Administered 2017-02-09 – 2017-02-21 (×13): 1 [drp] via OPHTHALMIC
  Filled 2017-02-09: qty 2.5

## 2017-02-09 MED ORDER — SODIUM CHLORIDE 0.9 % IV BOLUS (SEPSIS)
500.0000 mL | Freq: Once | INTRAVENOUS | Status: AC
  Administered 2017-02-09: 500 mL via INTRAVENOUS

## 2017-02-09 MED ORDER — PIPERACILLIN-TAZOBACTAM IN DEX 2-0.25 GM/50ML IV SOLN
2.2500 g | Freq: Three times a day (TID) | INTRAVENOUS | Status: DC
  Administered 2017-02-09 – 2017-02-13 (×11): 2.25 g via INTRAVENOUS
  Filled 2017-02-09 (×12): qty 50

## 2017-02-09 MED ORDER — DIPHENHYDRAMINE HCL 50 MG/ML IJ SOLN: 12.5000 mg | Freq: Four times a day (QID) | INTRAMUSCULAR | Status: DC | PRN

## 2017-02-09 NOTE — ED Notes (Signed)
Alyssa [name] reports she does not want pt to go to floor until Lactic has resolved r/t potential change in bed status. Charge made aware, Darryl at beside drawing labs now.

## 2017-02-09 NOTE — ED Notes (Signed)
Admitting at bedside 

## 2017-02-09 NOTE — ED Triage Notes (Signed)
Pt BIB [facility] from home where pt reports cramping abdominal pain starting around 0900 today. Pt reports constipation X7 days. Pt reports taking at home laxatives with no relief. 100 Fentanyl given PTA, 4 Zofran ODT given PTA. Pt have tenderness with palpation and movement. Ambulatory at home.

## 2017-02-09 NOTE — ED Notes (Signed)
Placed pt on 2L [place] r/t O2 in upper 80s. Pt grunting and holding breath with pain, encouraged patient to take slow deep breaths.

## 2017-02-09 NOTE — H&P (Signed)
History and Physical    Alyssa [name] EVO:[id number] DOB: 1945/08/30 DOA: 02/09/2017  PCP: Haywood Pao, MD Patient coming from: home  Chief Complaint: abdominal pain  HPI: Alyssa [name] is a 71 y.o. female with medical history significant for anemia, bipolar, diabetes, hypertension , hypothyroidism recent diagnosis of chronic kidney disease stage III recently hospitalized for syncope workup presents to the emergency department with the chief complaint abdominal pain nausea with one episode of emesis. Initial evaluation includes CT of the abdomen revealing diverticulitis with microperforation.  Information is obtained from the patient. She states that 1 week ago she was discharged from  hospital after a one-day stay for a syncopal episode. She states she has not had a bowel movement since that time. She states that she developed sudden abdominal cramping today around 9 AM. She has been taking some laxatives with no relief. She also reports intermittent abdominal pain over the last week. She describes the pain as sharp. She also has some nausea with one episode of emesis. She denied any coffee ground emesis. She denies headache fever chills chest pain palpitation shortness of breath. She denies dysuria hematuria frequency or urgency. She denies lower extremity edema or orthopnea. Denies chest pain palpitation shortness of breath cough    ED Course: In the emergency department temperature 99.2. She is provided with analgesia and antibiotics. Chart review indicates during her time in the emergency department blood pressures trended down heart rate is trended up  Review of Systems: As per HPI otherwise all other systems reviewed and are negative.   Ambulatory Status: Ambulates independently and is independent with ADLs  Past Medical History:  Diagnosis Date  . Anemia   . Bipolar 1 disorder ([place])   . Cataract   . Chronic back pain   . Diabetes mellitus without complication ([place])   .  Glaucoma   . Hyperlipidemia   . Hypertension   . Hypothyroidism   . Osteoporosis     Past Surgical History:  Procedure Laterality Date  . BACK SURGERY  2007  . BREAST SURGERY Left    CYST REMOVAL  . DILATION AND CURETTAGE OF UTERUS    . EYE SURGERY Left July 2015   Lt CE/ IOL implant  . EYE SURGERY Right July 2016   Rt CE/IOL  . IR THORACENTESIS ASP PLEURAL SPACE W/IMG GUIDE  01/10/2017  . KNEE ARTHROSCOPY Bilateral   . LUMBAR FUSION  2009    Social History   Social History  . Marital status: Married    Spouse name: N/A  . Number of children: N/A  . Years of education: N/A   Occupational History  .  Retired   Social History Main Topics  . Smoking status: Never Smoker  . Smokeless tobacco: Never Used  . Alcohol use Yes     Comment: social drinker- glass of wine or cocktail  . Drug use: No  . Sexual activity: No   Other Topics Concern  . Not on file   Social History Narrative  . No narrative on file    Allergies  Allergen Reactions  . Ciprofloxacin Hcl Diarrhea  . Cymbalta [Duloxetine Hcl] Other (See Comments)    Per pt: it did not work  . Lipitor [Atorvastatin] Other (See Comments)    Makes her feel "not right."  . Xanax [Alprazolam] Other (See Comments)    Excessive sleepiness  . Codeine   . Lortab [Hydrocodone-Acetaminophen] Other (See Comments)    Per pt: unknown  . Oxycodone  Other (See Comments)    Per pt: unknown    Family History  Problem Relation Age of Onset  . Adopted: Yes    Prior to Admission medications   Medication Sig Start Date End Date Taking? Authorizing Provider  ACCU-CHEK AVIVA PLUS test strip USE TO TEST BLOOD SUGAR THREE TIMES DAILY 06/28/16   Forcucci, Loma Sousa, PA-C  acetaminophen (TYLENOL) 650 MG CR tablet Take 1,300-1,950 mg by mouth 2 (two) times daily as needed for pain.    [provider]  aspirin 81 MG tablet Take 81 mg by mouth daily.    [provider]  Blood Glucose Monitoring Suppl (ACCU-CHEK  AVIVA PLUS) W/DEVICE KIT  09/13/14   [provider]  carvedilol (COREG) 3.125 MG tablet Take 1 tablet (3.125 mg total) by mouth 2 (two) times daily with a meal. 01/12/17   Florencia Reasons, MD  Cholecalciferol (VITAMIN D-3 PO) Take 5,000 Units by mouth every evening.     [provider]  Cyanocobalamin (VITAMIN B 12 PO) Take 1,000 mcg by mouth every morning.     [provider]  ferrous sulfate 325 (65 FE) MG tablet Take 325 mg by mouth daily with breakfast.    [provider]  furosemide (LASIX) 40 MG tablet Take 2 tablets (80 mg total) by mouth 2 (two) times daily. 02/02/17   Bonnielee Haff, MD  glipiZIDE (GLUCOTROL) 5 MG tablet Take 0.5 tablets (2.5 mg total) by mouth daily before breakfast. 01/12/17 01/12/18  Florencia Reasons, MD  insulin glargine (LANTUS) 100 UNIT/ML injection Inject 0.05 mLs (5 Units total) into the skin at bedtime. 01/12/17   Florencia Reasons, MD  isosorbide-hydrALAZINE (BIDIL) 20-37.5 MG tablet Take 1 tablet by mouth 3 (three) times daily. 01/12/17   Florencia Reasons, MD  Lancets (FREESTYLE) lancets Use as instructed 09/15/15   Forcucci, Loma Sousa, PA-C  latanoprost (XALATAN) 0.005 % ophthalmic solution Place 1 drop into both eyes every evening.  03/31/14   [provider]  levothyroxine (SYNTHROID, LEVOTHROID) 75 MCG tablet Take 1 tablet (75 mcg total) by mouth daily before breakfast. 01/13/17   Florencia Reasons, MD  loratadine (CLARITIN) 10 MG tablet Take 10 mg by mouth every evening.    [provider]  minoxidil (LONITEN) 10 MG tablet TAKE 1/2 TO 1 TABLET BY MOUTH DAILY AS DIRECTED FOR BLOOD PRESSURE Patient taking differently: TAKE 1 TABLET BY MOUTH DAILY AS DIRECTED FOR BLOOD PRESSURE 09/20/16   Vicie Mutters, PA-C  Multiple Vitamin (MULTIVITAMIN) tablet Take 1 tablet by mouth daily.    [provider]  Omega-3 Fatty Acids (FISH OIL) 1000 MG CAPS Take 1,000 mg by mouth every evening.     [provider]  timolol (TIMOPTIC) 0.5 % ophthalmic  solution INSTILL 1 DROP IN BOTH EYES TWICE DAILY 10/21/15   Unk Pinto, MD    Physical Exam: Vitals:   02/09/17 1230 02/09/17 1300 02/09/17 1315 02/09/17 1400  BP: (!) 124/53 (!) 136/52  110/66  Pulse: 80  90 99  Resp: (!) 26 (!) 22 (!) 24 19  Temp:      TempSrc:      SpO2: 100%  100% 98%  Weight:      Height:         General:  Appears calm and Only slightly uncomfortable Eyes:  PERRL, EOMI, normal lids, iris ENT:  grossly normal hearing, lips & tongue, mucous membranes of her mouth are pink slightly dry Neck:  no LAD, masses or thyromegaly Cardiovascular:  RRR, +murmur. No LE  edema. Pedal pulses present and palpable Respiratory:  CTA bilaterally, no w/r/r. Normal respiratory effort. Abdomen:  soft, slightly distended upper quadrants diffuse tenderness to minimal palpitations. Very diminished bowel sounds Skin:  no rash or induration seen on limited exam but very warm to touch Musculoskeletal:  grossly normal tone BUE/BLE, good ROM, no bony abnormality Psychiatric:  grossly normal mood and affect, speech fluent and appropriate, AOx3 Neurologic:  CN 2-12 grossly intact, moves all extremities in coordinated fashion, sensation intact [place] clear facial symmetry  Labs on Admission: I have personally reviewed following labs and imaging studies  CBC:  Recent Labs Lab 02/09/17 1246  WBC 8.3  NEUTROABS 7.5  HGB 9.8*  HCT 29.9*  MCV 87.2  PLT 097   Basic Metabolic Panel:  Recent Labs Lab 02/09/17 1246  NA 131*  K 3.6  CL 96*  CO2 23  GLUCOSE 217*  BUN 70*  CREATININE 4.25*  CALCIUM 9.9   GFR: Estimated Creatinine Clearance: 11.9 mL/min (A) (by C-G formula based on SCr of 4.25 mg/dL (H)). Liver Function Tests:  Recent Labs Lab 02/09/17 1246  AST 22  ALT 16  ALKPHOS 64  BILITOT 0.7  PROT 6.5  ALBUMIN 3.3*    Recent Labs Lab 02/09/17 1246  LIPASE 22   No results for input(s): AMMONIA in the last 168 hours. Coagulation Profile: No results for  input(s): INR, PROTIME in the last 168 hours. Cardiac Enzymes: No results for input(s): CKTOTAL, CKMB, CKMBINDEX, TROPONINI in the last 168 hours. BNP (last 3 results) No results for input(s): PROBNP in the last 8760 hours. HbA1C: No results for input(s): HGBA1C in the last 72 hours. CBG: No results for input(s): GLUCAP in the last 168 hours. Lipid Profile: No results for input(s): CHOL, HDL, LDLCALC, TRIG, CHOLHDL, LDLDIRECT in the last 72 hours. Thyroid Function Tests: No results for input(s): TSH, T4TOTAL, FREET4, T3FREE, THYROIDAB in the last 72 hours. Anemia Panel: No results for input(s): VITAMINB12, FOLATE, FERRITIN, TIBC, IRON, RETICCTPCT in the last 72 hours. Urine analysis:    Component Value Date/Time   COLORURINE YELLOW 02/01/2017 1300   APPEARANCEUR CLEAR 02/01/2017 1300   LABSPEC 1.012 02/01/2017 1300   PHURINE 5.0 02/01/2017 1300   GLUCOSEU NEGATIVE 02/01/2017 1300   HGBUR NEGATIVE 02/01/2017 1300   BILIRUBINUR NEGATIVE 02/01/2017 1300   KETONESUR NEGATIVE 02/01/2017 1300   PROTEINUR 30 (A) 02/01/2017 1300   UROBILINOGEN 0.2 02/14/2015 1157   NITRITE NEGATIVE 02/01/2017 1300   LEUKOCYTESUR NEGATIVE 02/01/2017 1300    Creatinine Clearance: Estimated Creatinine Clearance: 11.9 mL/min (A) (by C-G formula based on SCr of 4.25 mg/dL (H)).  Sepsis Labs: _0 (procalcitonin:4,lacticidven:4) ) Recent Results (from the past 240 hour(s))  Culture, blood (routine x 2)     Status: None   Collection Time: 02/01/17 11:50 AM  Result Value Ref Range Status   Specimen Description BLOOD RIGHT ANTECUBITAL  Final   Special Requests   Final    BOTTLES DRAWN AEROBIC AND ANAEROBIC Blood Culture adequate volume   Culture NO GROWTH 5 DAYS  Final   Report Status 02/06/2017 FINAL  Final  Culture, blood (routine x 2)     Status: None   Collection Time: 02/01/17 11:58 AM  Result Value Ref Range Status   Specimen Description BLOOD RIGHT HAND  Final   Special Requests   Final      BOTTLES DRAWN AEROBIC AND ANAEROBIC Blood Culture adequate volume   Culture NO GROWTH 5 DAYS  Final   Report Status 02/06/2017 FINAL  Final     Radiological Exams on Admission: Ct Abdomen Pelvis Wo Contrast  Result Date: 02/09/2017 CLINICAL DATA:  Cramping abdominal pain since 0900 hours, constipation for 7 days, no relief with laxatives, diabetes mellitus, hypertension, abnormal renal function with low GFR EXAM: CT ABDOMEN AND PELVIS WITHOUT CONTRAST TECHNIQUE: Multidetector CT imaging of the abdomen and pelvis was performed following the standard protocol without IV contrast. Sagittal and coronal MPR images reconstructed from axial data set. Oral contrast was not administered. COMPARISON:  10/05/2014 FINDINGS: Lower chest: Lung bases clear Hepatobiliary: Distended gallbladder with dependent calculi. No definite pericholecystic infiltration. Liver unremarkable. Pancreas: Atrophic pancreas without mass Spleen: Normal appearance Adrenals/Urinary Tract: Adrenal thickening without discrete mass. Unremarkable kidneys without mass or hydronephrosis. No ureteral calcification or dilatation definitely visualized. Bladder contains only a small amount of urine. Stomach/Bowel: Normal appendix. Diffuse colonic diverticulosis. Wall thickening of sigmoid colon with adjacent infiltrative changes suspicious for acute diverticulitis. Inflammatory changes extend adjacent to the uterus. No abscess. Small collection of gas is seen posterior to the uterus on the RIGHT could potentially represent extraluminal gas arising from the sigmoid colon inflammatory process. Stomach and remaining bowel loops unremarkable. No abnormal retained stool burden identified. Vascular/Lymphatic: Extensive atherosclerotic calcifications aorta and abdominal vessels without aneurysm. Coronary arterial calcifications. Minimal pericardial fluid. Reproductive: Calcified leiomyomata within uterus. Ovaries unremarkable. Other: No free intraperitoneal  air or fluid.  No hernia. Musculoskeletal: Osseous demineralization with prior lower lumbar fusion. IMPRESSION: Diffuse colonic diverticulosis with sigmoid wall thickening and pericolic and treat changes consistent with acute diverticulitis. Edematous changes extend adjacent to the uterus with a small focal gas collection is seen posterior to the uterus, could represent extraluminal gas arising from perforated sigmoid diverticulitis but no abscess collection is identified. Calcified uterine leiomyomata. Aortic Atherosclerosis (ICD10-I70.0). Electronically Signed   By: Lavonia Dana M.D.   On: 02/09/2017 13:46    EKG:   Assessment/Plan Principal Problem:   Diverticulitis Active Problems:   Hypertension   Bipolar 1 disorder (HCC)   Morbid obesity (BMI 39.21)    Depression, major, in partial remission (HCC)   CKD (chronic kidney disease) stage 3, GFR 30-59 ml/min   Hypothyroidism   T2_NIDDM   Anemia   Congestive heart failure (CHF) ([place])    #1. Diverticulitis with microperforation. CT reveals diffuse colonic diverticulosis with sigmoid wall thickening and pericolic and treat changes consistent with acute diverticulitis. Edematous changes extend adjacent to the uterus with a small focal gas collection is seen posterior to the uterus, could represent extraluminal gas arising from perforated sigmoid diverticulitis but no abscess collection is identified. No leukocytosis, rectal  temperature 102, hemodynamically stable on admission -Admit to MedSurg -IV fluids -blood cultures -trend lactic acid -chest xray -Continue Zosyn -Bowel rest -Supportive therapies -Appreciate general surgery input  #2. Chronic kidney disease stage III. Creatinine 4.2 on admission. Chart review indicates this is close to her baseline. -Hold nephrotoxins -Monitor urine output -Recheck  #3. Hypertension. Only fair control in the emergency department. Home medications include Coreg, Lasix,minoxadil, bidil. Blood  pressure is trending downward while in the emergency department. Probably related to pain medicine versus early sepsis -hold lasix, bidil, minoxadil -monitor -follow lactic acid -iv fluids  #4. Chronic diastolic heart failure. Appears compensated. Lasix dosage recently reduced due to syncope presumably related to hypovolemia. Recent echo reveals an EF of 50% severe LVH and grade 1 diastolic dysfunction -Daily weights -Monitor intake and output -Continue beta blocker -Holding Lasix as noted above  #5. Diabetes. Recent A1c 7.4. Serum  glucose 217 on admission. Home meds include Lantus sliding scale and metformin. -Hold oral agents -Continue home Lantus dosage -Sliding scale insulin  #6. Anemia. Likely of chronic disease. Hemoglobin 9.8 on admission. Chart review indicates this is close to her baseline. No signs symptoms of active bleeding -Monitor  #7. Hypothyroidism. Recent TSH 0.526. - continue home meds    DVT prophylaxis: heparin for 2 doses/scd Code Status: full  Family Communication: daughter at bedside  Disposition Plan: home  Consults called: [facility]  Admission status: inpatient    Radene Gunning MD [facility]  If 7PM-7AM, please contact night-coverage [email or web address] Password [email or web address]  02/09/2017, 2:52 PM

## 2017-02-09 NOTE — ED Provider Notes (Signed)
Alyssa [name]   CSN: [id number] Arrival date & time: 02/09/17  1133     History   Chief Complaint Chief Complaint  Patient presents with  . Abdominal Pain    HPI Alyssa [name] is a 71 y.o. female.  Patient is a 71 year old female who presents with abdominal pain. She has a history of diabetes, hypertension, hyperlipidemia and bipolar disorder. She feels that her pain is related to constipation as she has not had bowel movement in about one week. She was recently admitted about a week ago for a syncopal episode with seizure activity. She states that she has not had bowel movement since that hospitalization week ago. She complains of pain to her abdomen. She states is intermittent through the week but this morning woke up and it was intense and diffuse. She's had some nausea and one episode of vomiting today. She denies any known fevers. No urinary symptoms. No history of similar symptoms in the past. No history of constipation in the past. She received fentanyl by EMS and feels a little bit better after that.      Past Medical History:  Diagnosis Date  . Anemia   . Bipolar 1 disorder ([place])   . Cataract   . Chronic back pain   . Diabetes mellitus without complication ([place])   . Glaucoma   . Hyperlipidemia   . Hypertension   . Hypothyroidism   . Osteoporosis     Patient Active Problem List   Diagnosis Date Noted  . Syncope and collapse 02/01/2017  . Syncope 02/01/2017  . 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 Pleural effusion on right 01/24/2017  . Acute renal failure with acute tubular necrosis superimposed on stage 3 chronic kidney disease ([place])   . Acute on chronic diastolic CHF (congestive heart failure) ([place])   . Abnormal liver function   . Anemia 01/04/2017  . Congestive heart failure (CHF) ([place]) 01/04/2017  . T2_NIDDM 03/31/2015  . Actinic keratitis 02/23/2015  . Encounter for Medicare annual wellness exam 02/09/2015  .  Medication management 07/14/2014  . Hypothyroidism 07/14/2014  . CKD (chronic kidney disease) stage 3, GFR 30-59 ml/min 06/24/2014  . Gout 06/24/2014  . Acute diastolic congestive heart failure ([place]) 10/13/2013  . Morbid obesity (BMI 39.21)  07/15/2013  . 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 Vitamin D deficiency   . Osteoporosis   . 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 Hypertension   . Bipolar 1 disorder ([place])   . Hyperlipidemia     Past Surgical History:  Procedure Laterality Date  . BACK SURGERY  2007  . BREAST SURGERY Left    CYST REMOVAL  . DILATION AND CURETTAGE OF UTERUS    . EYE SURGERY Left July 2015   Lt CE/ IOL implant  . EYE SURGERY Right July 2016   Rt CE/IOL  . IR THORACENTESIS ASP PLEURAL SPACE W/IMG GUIDE  01/10/2017  . KNEE ARTHROSCOPY Bilateral   . LUMBAR FUSION  2009    OB History    No data available       Home Medications    Prior to Admission medications   Medication Sig Start Date End Date Taking? Authorizing Provider  ACCU-CHEK AVIVA PLUS test strip USE TO TEST BLOOD SUGAR THREE TIMES DAILY 06/28/16   Forcucci, Loma Sousa, PA-C  acetaminophen (TYLENOL) 650 MG CR tablet Take 1,300-1,950 mg by mouth 2 (two) times daily as needed for pain.    [provider]  aspirin  81 MG tablet Take 81 mg by mouth daily.    [provider]  atorvastatin (LIPITOR) 80 MG tablet TAKE 1 TABLET(80 MG) BY MOUTH DAILY Patient not taking: Reported on 02/01/2017 06/05/16   Unk Pinto, MD  Blood Glucose Monitoring Suppl (ACCU-CHEK AVIVA PLUS) W/DEVICE KIT  09/13/14   [provider]  carvedilol (COREG) 3.125 MG tablet Take 1 tablet (3.125 mg total) by mouth 2 (two) times daily with a meal. 01/12/17   Florencia Reasons, MD  Cholecalciferol (VITAMIN D-3 PO) Take 5,000 Units by mouth every evening.     [provider]  Cyanocobalamin (VITAMIN B 12 PO) Take 1,000 mcg by mouth every morning.     [provider]  ferrous sulfate 325 (65  FE) MG tablet Take 325 mg by mouth daily with breakfast.    [provider]  furosemide (LASIX) 40 MG tablet Take 2 tablets (80 mg total) by mouth 2 (two) times daily. 02/02/17   Bonnielee Haff, MD  glipiZIDE (GLUCOTROL) 5 MG tablet Take 0.5 tablets (2.5 mg total) by mouth daily before breakfast. 01/12/17 01/12/18  Florencia Reasons, MD  insulin glargine (LANTUS) 100 UNIT/ML injection Inject 0.05 mLs (5 Units total) into the skin at bedtime. 01/12/17   Florencia Reasons, MD  isosorbide-hydrALAZINE (BIDIL) 20-37.5 MG tablet Take 1 tablet by mouth 3 (three) times daily. 01/12/17   Florencia Reasons, MD  Lancets (FREESTYLE) lancets Use as instructed 09/15/15   Forcucci, Loma Sousa, PA-C  latanoprost (XALATAN) 0.005 % ophthalmic solution Place 1 drop into both eyes every evening.  03/31/14   [provider]  levothyroxine (SYNTHROID, LEVOTHROID) 75 MCG tablet Take 1 tablet (75 mcg total) by mouth daily before breakfast. 01/13/17   Florencia Reasons, MD  loratadine (CLARITIN) 10 MG tablet Take 10 mg by mouth every evening.    [provider]  minoxidil (LONITEN) 10 MG tablet TAKE 1/2 TO 1 TABLET BY MOUTH DAILY AS DIRECTED FOR BLOOD PRESSURE Patient taking differently: TAKE 1 TABLET BY MOUTH DAILY AS DIRECTED FOR BLOOD PRESSURE 09/20/16   Vicie Mutters, PA-C  Multiple Vitamin (MULTIVITAMIN) tablet Take 1 tablet by mouth daily.    [provider]  Omega-3 Fatty Acids (FISH OIL) 1000 MG CAPS Take 1,000 mg by mouth every evening.     [provider]  timolol (TIMOPTIC) 0.5 % ophthalmic solution INSTILL 1 DROP IN BOTH EYES TWICE DAILY 10/21/15   Unk Pinto, MD    Family History Family History  Problem Relation Age of Onset  . Adopted: Yes    Social History Social History  Substance Use Topics  . Smoking status: Never Smoker  . Smokeless tobacco: Never Used  . Alcohol use Yes     Comment: social drinker- glass of wine or cocktail     Allergies   Ciprofloxacin hcl; Cymbalta [duloxetine hcl];  Lipitor [atorvastatin]; Xanax [alprazolam]; Codeine; Lortab [hydrocodone-acetaminophen]; and Oxycodone   Review of Systems Review of Systems  Constitutional: Positive for fatigue. Negative for chills, diaphoresis and fever.  HENT: Negative for congestion, rhinorrhea and sneezing.   Eyes: Negative.   Respiratory: Negative for cough, chest tightness and shortness of breath.   Cardiovascular: Negative for chest pain and leg swelling.  Gastrointestinal: Positive for abdominal pain, nausea and vomiting. Negative for blood in stool and diarrhea.  Genitourinary: Negative for difficulty urinating, flank pain, frequency and hematuria.  Musculoskeletal: Negative for arthralgias and back pain.  Skin: Negative for rash.  Neurological: Negative for dizziness, speech difficulty, weakness, numbness and headaches.  Physical Exam Updated Vital Signs BP (!) 136/52   Pulse 90   Temp 99.2 F (37.3 C) (Oral)   Resp (!) 24   Ht '5\' 3"'$  (1.6 m)   Wt 79.4 kg (175 lb)   SpO2 100%   BMI 31.00 kg/m   Physical Exam  Constitutional: She is oriented to person, place, and time. She appears well-developed and well-nourished.  Appears uncomfortable  HENT:  Head: Normocephalic and atraumatic.  Eyes: Pupils are equal, round, and reactive to light.  Neck: Normal range of motion. Neck supple.  Cardiovascular: Normal rate, regular rhythm and normal heart sounds.   Pulmonary/Chest: Effort normal and breath sounds normal. No respiratory distress. She has no wheezes. She has no rales. She exhibits no tenderness.  Abdominal: Soft. Bowel sounds are normal. There is tenderness (Moderate diffuse tenderness). There is guarding. There is no rebound.  Musculoskeletal: Normal range of motion. She exhibits no edema.  Lymphadenopathy:    She has no cervical adenopathy.  Neurological: She is alert and oriented to person, place, and time.  Skin: Skin is warm and dry. No rash noted.  Psychiatric: She has a normal mood and  affect.     ED Treatments / Results  Labs (all labs ordered are listed, but only abnormal results are displayed) Labs Reviewed  CBC WITH DIFFERENTIAL/PLATELET - Abnormal; Notable for the following:       Result Value   RBC 3.43 (*)    Hemoglobin 9.8 (*)    HCT 29.9 (*)    Lymphs Abs 0.5 (*)    All other components within normal limits  COMPREHENSIVE METABOLIC PANEL - Abnormal; Notable for the following:    Sodium 131 (*)    Chloride 96 (*)    Glucose, Bld 217 (*)    BUN 70 (*)    Creatinine, Ser 4.25 (*)    Albumin 3.3 (*)    GFR calc non Af Amer 10 (*)    GFR calc Af Amer 11 (*)    All other components within normal limits  LIPASE, BLOOD  URINALYSIS, ROUTINE W REFLEX MICROSCOPIC    EKG  EKG Interpretation None       Radiology Ct Abdomen Pelvis Wo Contrast  Result Date: 02/09/2017 CLINICAL DATA:  Cramping abdominal pain since 0900 hours, constipation for 7 days, no relief with laxatives, diabetes mellitus, hypertension, abnormal renal function with low GFR EXAM: CT ABDOMEN AND PELVIS WITHOUT CONTRAST TECHNIQUE: Multidetector CT imaging of the abdomen and pelvis was performed following the standard protocol without IV contrast. Sagittal and coronal MPR images reconstructed from axial data set. Oral contrast was not administered. COMPARISON:  10/05/2014 FINDINGS: Lower chest: Lung bases clear Hepatobiliary: Distended gallbladder with dependent calculi. No definite pericholecystic infiltration. Liver unremarkable. Pancreas: Atrophic pancreas without mass Spleen: Normal appearance Adrenals/Urinary Tract: Adrenal thickening without discrete mass. Unremarkable kidneys without mass or hydronephrosis. No ureteral calcification or dilatation definitely visualized. Bladder contains only a small amount of urine. Stomach/Bowel: Normal appendix. Diffuse colonic diverticulosis. Wall thickening of sigmoid colon with adjacent infiltrative changes suspicious for acute diverticulitis.  Inflammatory changes extend adjacent to the uterus. No abscess. Small collection of gas is seen posterior to the uterus on the RIGHT could potentially represent extraluminal gas arising from the sigmoid colon inflammatory process. Stomach and remaining bowel loops unremarkable. No abnormal retained stool burden identified. Vascular/Lymphatic: Extensive atherosclerotic calcifications aorta and abdominal vessels without aneurysm. Coronary arterial calcifications. Minimal pericardial fluid. Reproductive: Calcified leiomyomata within uterus. Ovaries unremarkable. Other: No free intraperitoneal  air or fluid.  No hernia. Musculoskeletal: Osseous demineralization with prior lower lumbar fusion. IMPRESSION: Diffuse colonic diverticulosis with sigmoid wall thickening and pericolic and treat changes consistent with acute diverticulitis. Edematous changes extend adjacent to the uterus with a small focal gas collection is seen posterior to the uterus, could represent extraluminal gas arising from perforated sigmoid diverticulitis but no abscess collection is identified. Calcified uterine leiomyomata. Aortic Atherosclerosis (ICD10-I70.0). Electronically Signed   By: Lavonia Dana M.D.   On: 02/09/2017 13:46    Procedures Procedures (including critical care time)  Medications Ordered in ED Medications  piperacillin-tazobactam (ZOSYN) IVPB 3.375 g (not administered)  morphine 4 MG/ML injection 4 mg (4 mg Intravenous Given 02/09/17 1243)  sodium chloride 0.9 % bolus 500 mL (500 mLs Intravenous New Bag/Given 02/09/17 1243)     Initial Impression / Assessment and Plan / ED Course  I have reviewed the triage vital signs and the nursing notes.  Pertinent labs & imaging results that were available during my care of the patient were reviewed by me and considered in my medical decision making (see chart for details).     Patient presents with diffuse abdominal pain. She is markedly tender on exam. CT scan shows evidence  of diverticulitis with a question of a perforation. No abscess is noted. I spoke with Dr. Grandville Silos with general surgery who will see the patient. He requests medicine to admit. I spoke with the APP with the hospitalist service to admit the patient. She was given IV Zosyn and IV fluids.  Final Clinical Impressions(s) / ED Diagnoses   Final diagnoses:  Diverticulitis    New Prescriptions New Prescriptions   No medications on file     Malvin Johns, MD 02/09/17 1420

## 2017-02-09 NOTE — ED Notes (Signed)
Attempted report 

## 2017-02-09 NOTE — Progress Notes (Signed)
Pt's blood sugar at this time is 109, verified with doctor on call, stated to administer Lantus.

## 2017-02-09 NOTE — Progress Notes (Signed)
Pharmacy Antibiotic Note  Alyssa [name] is a 71 y.o. female admitted on 02/09/2017 with intra-abdominal infection.  Pharmacy has been consulted for Pip/Tazo dosing.  She has a creatinine of 4.25 with an estimated crcl of < 22ml/min.  WBC is 8.3K, she is afebrile at 99.2 and vitals are stable.  Plan: Received Zosyn 3.375 gm x 1 at 14:30PM Begin maintenance dose of Zosyn 2.25gm every eight hours with decreased renal function Monitor renal function, micro data, s/s of infection  Height: 5\' 3"  (160 cm) Weight: 175 lb (79.4 kg) IBW/kg (Calculated) : 52.4  Temp (24hrs), Avg:99.2 F (37.3 C), Min:99.2 F (37.3 C), Max:99.2 F (37.3 C)   Recent Labs Lab 02/09/17 1246  WBC 8.3  CREATININE 4.25*    Estimated Creatinine Clearance: 11.9 mL/min (A) (by C-G formula based on SCr of 4.25 mg/dL (H)).    Allergies  Allergen Reactions  . Ciprofloxacin Hcl Diarrhea  . Cymbalta [Duloxetine Hcl] Other (See Comments)    Per pt: it did not work  . Lipitor [Atorvastatin] Other (See Comments)    Makes her feel "not right."  . Xanax [Alprazolam] Other (See Comments)    Excessive sleepiness  . Codeine   . Lortab [Hydrocodone-Acetaminophen] Other (See Comments)    Per pt: unknown  . Oxycodone Other (See Comments)    Per pt: unknown    Antimicrobials this admission: Pip/Tazo 7/21 >>   Dose adjustments this admission: Pip/Tazo - starting at renal adjusted dose of 2.25gm every 8 hours.  Microbiology results: BCx:  UCx Sputum:  MRSA PCR:   Rober Minion, PharmD., MS Clinical Pharmacist Pager:  [phone] Thank you for allowing pharmacy to be part of this patients care team. 02/09/2017 2:46 PM

## 2017-02-09 NOTE — ED Notes (Signed)
Portable XR at bedside

## 2017-02-09 NOTE — Consult Note (Signed)
Reason for Consult:Diverticulitis Referring Physician: M. Belfi  Alyssa [name] is an 71 y.o. female.  HPI: [name] was admitted to the emergency room today complaining of a one-week history of lower abdominal pain. She has not had a bowel movement in several days. She is passing some gas. Workup in the emergency department demonstrated white blood cell count 8300. CT scan of the abdomen and pelvis shows sigmoid diverticulitis with microperforation, no abscess. I was asked to see her from a surgical standpoint.  Past Medical History:  Diagnosis Date  . Anemia   . Bipolar 1 disorder ([place])   . Cataract   . Chronic back pain   . Diabetes mellitus without complication ([place])   . Glaucoma   . Hyperlipidemia   . Hypertension   . Hypothyroidism   . Osteoporosis     Past Surgical History:  Procedure Laterality Date  . BACK SURGERY  2007  . BREAST SURGERY Left    CYST REMOVAL  . DILATION AND CURETTAGE OF UTERUS    . EYE SURGERY Left July 2015   Lt CE/ IOL implant  . EYE SURGERY Right July 2016   Rt CE/IOL  . IR THORACENTESIS ASP PLEURAL SPACE W/IMG GUIDE  01/10/2017  . KNEE ARTHROSCOPY Bilateral   . LUMBAR FUSION  2009    Family History  Problem Relation Age of Onset  . Adopted: Yes    Social History:  reports that she has never smoked. She has never used smokeless tobacco. She reports that she drinks alcohol. She reports that she does not use drugs.  Allergies:  Allergies  Allergen Reactions  . Ciprofloxacin Hcl Diarrhea  . Cymbalta [Duloxetine Hcl] Other (See Comments)    Per pt: it did not work  . Lipitor [Atorvastatin] Other (See Comments)    Makes her feel "not right."  . Xanax [Alprazolam] Other (See Comments)    Excessive sleepiness  . Codeine Other (See Comments)    Per pt: unknown  . Lortab [Hydrocodone-Acetaminophen] Other (See Comments)    Per pt: unknown  . Oxycodone Other (See Comments)    Per pt: unknown    Medications: I have reviewed the patient's  current medications.  Results for orders placed or performed during the hospital encounter of 02/09/17 (from the past 48 hour(s))  CBC with Differential     Status: Abnormal   Collection Time: 02/09/17 12:46 PM  Result Value Ref Range   WBC 8.3 4.0 - 10.5 K/uL   RBC 3.43 (L) 3.87 - 5.11 MIL/uL   Hemoglobin 9.8 (L) 12.0 - 15.0 g/dL   HCT 29.9 (L) 36.0 - 46.0 %   MCV 87.2 78.0 - 100.0 fL   MCH 28.6 26.0 - 34.0 pg   MCHC 32.8 30.0 - 36.0 g/dL   RDW 14.3 11.5 - 15.5 %   Platelets 298 150 - 400 K/uL   Neutrophils Relative % 90 %   Neutro Abs 7.5 1.7 - 7.7 K/uL   Lymphocytes Relative 6 %   Lymphs Abs 0.5 (L) 0.7 - 4.0 K/uL   Monocytes Relative 4 %   Monocytes Absolute 0.3 0.1 - 1.0 K/uL   Eosinophils Relative 0 %   Eosinophils Absolute 0.0 0.0 - 0.7 K/uL   Basophils Relative 0 %   Basophils Absolute 0.0 0.0 - 0.1 K/uL  Comprehensive metabolic panel     Status: Abnormal   Collection Time: 02/09/17 12:46 PM  Result Value Ref Range   Sodium 131 (L) 135 - 145 mmol/L  Potassium 3.6 3.5 - 5.1 mmol/L   Chloride 96 (L) 101 - 111 mmol/L   CO2 23 22 - 32 mmol/L   Glucose, Bld 217 (H) 65 - 99 mg/dL   BUN 70 (H) 6 - 20 mg/dL   Creatinine, Ser 4.25 (H) 0.44 - 1.00 mg/dL   Calcium 9.9 8.9 - 10.3 mg/dL   Total Protein 6.5 6.5 - 8.1 g/dL   Albumin 3.3 (L) 3.5 - 5.0 g/dL   AST 22 15 - 41 U/L   ALT 16 14 - 54 U/L   Alkaline Phosphatase 64 38 - 126 U/L   Total Bilirubin 0.7 0.3 - 1.2 mg/dL   GFR calc non Af Amer 10 (L) >60 mL/min   GFR calc Af Amer 11 (L) >60 mL/min    Comment: (NOTE) The eGFR has been calculated using the CKD EPI equation. This calculation has not been validated in all clinical situations. eGFR's persistently <60 mL/min signify possible Chronic Kidney Disease.    Anion gap 12 5 - 15  Lipase, blood     Status: None   Collection Time: 02/09/17 12:46 PM  Result Value Ref Range   Lipase 22 11 - 51 U/L    Ct Abdomen Pelvis Wo Contrast  Result Date: 02/09/2017 CLINICAL  DATA:  Cramping abdominal pain since 0900 hours, constipation for 7 days, no relief with laxatives, diabetes mellitus, hypertension, abnormal renal function with low GFR EXAM: CT ABDOMEN AND PELVIS WITHOUT CONTRAST TECHNIQUE: Multidetector CT imaging of the abdomen and pelvis was performed following the standard protocol without IV contrast. Sagittal and coronal MPR images reconstructed from axial data set. Oral contrast was not administered. COMPARISON:  10/05/2014 FINDINGS: Lower chest: Lung bases clear Hepatobiliary: Distended gallbladder with dependent calculi. No definite pericholecystic infiltration. Liver unremarkable. Pancreas: Atrophic pancreas without mass Spleen: Normal appearance Adrenals/Urinary Tract: Adrenal thickening without discrete mass. Unremarkable kidneys without mass or hydronephrosis. No ureteral calcification or dilatation definitely visualized. Bladder contains only a small amount of urine. Stomach/Bowel: Normal appendix. Diffuse colonic diverticulosis. Wall thickening of sigmoid colon with adjacent infiltrative changes suspicious for acute diverticulitis. Inflammatory changes extend adjacent to the uterus. No abscess. Small collection of gas is seen posterior to the uterus on the RIGHT could potentially represent extraluminal gas arising from the sigmoid colon inflammatory process. Stomach and remaining bowel loops unremarkable. No abnormal retained stool burden identified. Vascular/Lymphatic: Extensive atherosclerotic calcifications aorta and abdominal vessels without aneurysm. Coronary arterial calcifications. Minimal pericardial fluid. Reproductive: Calcified leiomyomata within uterus. Ovaries unremarkable. Other: No free intraperitoneal air or fluid.  No hernia. Musculoskeletal: Osseous demineralization with prior lower lumbar fusion. IMPRESSION: Diffuse colonic diverticulosis with sigmoid wall thickening and pericolic and treat changes consistent with acute diverticulitis. Edematous  changes extend adjacent to the uterus with a small focal gas collection is seen posterior to the uterus, could represent extraluminal gas arising from perforated sigmoid diverticulitis but no abscess collection is identified. Calcified uterine leiomyomata. Aortic Atherosclerosis (ICD10-I70.0). Electronically Signed   By: Lavonia Dana M.D.   On: 02/09/2017 13:46    Review of Systems  Constitutional: Positive for malaise/fatigue. Negative for fever.  HENT: Negative.   Eyes: Negative.   Respiratory: Negative for cough and shortness of breath.   Cardiovascular: Negative for chest pain.  Gastrointestinal: Positive for abdominal pain. Negative for nausea and vomiting.       Feels bloated  Genitourinary: Negative.   Musculoskeletal: Negative.   Skin: Negative.   Neurological: Negative.   Endo/Heme/Allergies: Negative.    Blood pressure  110/66, pulse 99, temperature 99.2 F (37.3 C), temperature source Oral, resp. rate 19, height '5\' 3"'$  (1.6 m), weight 79.4 kg (175 lb), SpO2 98 %. Physical Exam  Constitutional: She is oriented to person, place, and time. She appears well-developed and well-nourished. No distress.  HENT:  Head: Normocephalic.  Right Ear: External ear normal.  Left Ear: External ear normal.  Nose: Nose normal.  Mouth/Throat: Oropharynx is clear and moist.  Eyes: Pupils are equal, round, and reactive to light.  Neck: Neck supple.  Cardiovascular: Normal rate and normal heart sounds.   Respiratory: Effort normal and breath sounds normal. No respiratory distress. She has no wheezes. She has no rales.  GI: Soft. She exhibits no distension. There is tenderness. There is no rebound and no guarding.  Tender left lower quadrant and right lower quadrant, she is quite tender but does not have peritonitis  Musculoskeletal: Normal range of motion.  Neurological: She is alert and oriented to person, place, and time.  Skin: Skin is warm.  Psychiatric:  Flat affect     Assessment/Plan: Sigmoid diverticulitis with microperforation - recommend bowel rest, IV Zosyn. Agree with medical admission. We will follow. If she worsens, she may require colectomy with colostomy and I discussed this with her.  Virdie Penning E 02/09/2017, 2:32 PM

## 2017-02-10 LAB — URINALYSIS, ROUTINE W REFLEX MICROSCOPIC
BILIRUBIN URINE: NEGATIVE
GLUCOSE, UA: NEGATIVE mg/dL
HGB URINE DIPSTICK: NEGATIVE
Ketones, ur: NEGATIVE mg/dL
NITRITE: NEGATIVE
Protein, ur: 100 mg/dL — AB
SPECIFIC GRAVITY, URINE: 1.014 (ref 1.005–1.030)
pH: 5 (ref 5.0–8.0)

## 2017-02-10 LAB — COMPREHENSIVE METABOLIC PANEL
ALT: 23 U/L (ref 14–54)
ANION GAP: 12 (ref 5–15)
AST: 30 U/L (ref 15–41)
Albumin: 2.6 g/dL — ABNORMAL LOW (ref 3.5–5.0)
Alkaline Phosphatase: 73 U/L (ref 38–126)
BILIRUBIN TOTAL: 0.8 mg/dL (ref 0.3–1.2)
BUN: 67 mg/dL — ABNORMAL HIGH (ref 6–20)
CO2: 22 mmol/L (ref 22–32)
Calcium: 8.8 mg/dL — ABNORMAL LOW (ref 8.9–10.3)
Chloride: 102 mmol/L (ref 101–111)
Creatinine, Ser: 4.03 mg/dL — ABNORMAL HIGH (ref 0.44–1.00)
GFR calc Af Amer: 12 mL/min — ABNORMAL LOW (ref >60)
GFR, EST NON AFRICAN AMERICAN: 10 mL/min — AB (ref >60)
Glucose, Bld: 179 mg/dL — ABNORMAL HIGH (ref 65–99)
POTASSIUM: 3.5 mmol/L (ref 3.5–5.1)
Sodium: 136 mmol/L (ref 135–145)
TOTAL PROTEIN: 6.6 g/dL (ref 6.5–8.1)

## 2017-02-10 LAB — CBC
HEMATOCRIT: 26.3 % — AB (ref 36.0–46.0)
HEMOGLOBIN: 8.6 g/dL — AB (ref 12.0–15.0)
MCH: 28.7 pg (ref 26.0–34.0)
MCHC: 32.7 g/dL (ref 30.0–36.0)
MCV: 87.7 fL (ref 78.0–100.0)
Platelets: 272 10*3/uL (ref 150–400)
RBC: 3 MIL/uL — ABNORMAL LOW (ref 3.87–5.11)
RDW: 14.6 % (ref 11.5–15.5)
WBC: 12.5 10*3/uL — AB (ref 4.0–10.5)

## 2017-02-10 LAB — GLUCOSE, CAPILLARY
GLUCOSE-CAPILLARY: 154 mg/dL — AB (ref 65–99)
GLUCOSE-CAPILLARY: 82 mg/dL (ref 65–99)
GLUCOSE-CAPILLARY: 135 mg/dL — AB (ref 65–99)
Glucose-Capillary: 101 mg/dL — ABNORMAL HIGH (ref 65–99)

## 2017-02-10 MED ORDER — SODIUM CHLORIDE 0.9 % IV SOLN
INTRAVENOUS | Status: DC
  Administered 2017-02-10 – 2017-02-13 (×3): via INTRAVENOUS

## 2017-02-10 NOTE — Progress Notes (Signed)
Progress Note: General Surgery Service   Assessment/Plan: Patient Active Problem List   Diagnosis Date Noted  . Diverticulitis 02/09/2017  . Syncope and collapse 02/01/2017  . Syncope 02/01/2017  . 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 Pleural effusion on right 01/24/2017  . Acute renal failure with acute tubular necrosis superimposed on stage 3 chronic kidney disease ([place])   . Acute on chronic diastolic CHF (congestive heart failure) ([place])   . Abnormal liver function   . Anemia 01/04/2017  . Congestive heart failure (CHF) ([place]) 01/04/2017  . T2_NIDDM 03/31/2015  . Actinic keratitis 02/23/2015  . Encounter for Medicare annual wellness exam 02/09/2015  . Medication management 07/14/2014  . Hypothyroidism 07/14/2014  . CKD (chronic kidney disease) stage 3, GFR 30-59 ml/min 06/24/2014  . Gout 06/24/2014  . Acute diastolic congestive heart failure ([place]) 10/13/2013  . Morbid obesity (BMI 39.21)  07/15/2013  . Depression, major, in partial remission ([place]) 07/15/2013  . Vitamin D deficiency   . Osteoporosis   . 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 Hypertension   . Bipolar 1 disorder ([place])   . Hyperlipidemia    s/p    Diverticulitis with small amount free air. WBC up today, uncomfortable -continue ABX -continue bowel rest   LOS: 1 day  Chief Complaint/Subjective: Uncomfortable, itching all over  Objective: Vital signs in last 24 hours: Temp:  [97.7 F (36.5 C)-102 F (38.9 C)] 98.3 F (36.8 C) (07/22 0519) Pulse Rate:  [73-99] 73 (07/22 0519) Resp:  [14-26] 19 (07/22 0519) BP: (110-148)/(48-80) 148/80 (07/22 0519) SpO2:  [93 %-100 %] 99 % (07/22 0519) Weight:  [79.4 kg (175 lb)-86.6 kg (190 lb 14.4 oz)] 86.6 kg (190 lb 14.4 oz) (07/22 0519) Last BM Date:  (unable to recall)  Intake/Output from previous day: 07/21 0701 - 07/22 0700 In: 550 [IV Piggyback:550] Out: 500 [Urine:500] Intake/Output this shift: No intake/output data recorded. Abd: tender to palpation  throughout abdomen  Extremities: no edema  Neuro: AOx4  Lab Results: CBC   Recent Labs  02/09/17 1246 02/10/17 0604  WBC 8.3 12.5*  HGB 9.8* 8.6*  HCT 29.9* 26.3*  PLT 298 272   BMET  Recent Labs  02/09/17 1246 02/09/17 1531 02/10/17 0604  NA 131*  --  136  K 3.6  --  3.5  CL 96*  --  102  CO2 23  --  22  GLUCOSE 217*  --  179*  BUN 70*  --  67*  CREATININE 4.25* 4.28* 4.03*  CALCIUM 9.9  --  8.8*   PT/INR No results for input(s): LABPROT, INR in the last 72 hours. ABG No results for input(s): PHART, HCO3 in the last 72 hours.  Invalid input(s): PCO2, PO2  Studies/Results:  Anti-infectives: Anti-infectives    Start     Dose/Rate Route Frequency Ordered Stop   02/09/17 2300  piperacillin-tazobactam (ZOSYN) IVPB 2.25 g     2.25 g 100 mL/hr over 30 Minutes Intravenous Every 8 hours 02/09/17 1454     02/09/17 1400  piperacillin-tazobactam (ZOSYN) IVPB 3.375 g     3.375 g 100 mL/hr over 30 Minutes Intravenous  Once 02/09/17 1355 02/09/17 1512      Medications: Scheduled Meds: . carvedilol  3.125 mg Oral BID WC  . insulin aspart  0-15 Units Subcutaneous TID WC  . insulin aspart  0-5 Units Subcutaneous QHS  . insulin glargine  5 Units Subcutaneous QHS  . latanoprost  1 drop Both Eyes QPM  . levothyroxine  75  mcg Oral QAC breakfast  . timolol  1 drop Both Eyes BID   Continuous Infusions: . piperacillin-tazobactam (ZOSYN)  IV 2.25 g (02/10/17 0516)   PRN Meds:.diphenhydrAMINE, morphine injection, ondansetron **OR** ondansetron (ZOFRAN) IV, sodium phosphate  Mickeal Skinner, MD Pg# [phone] [facility]

## 2017-02-10 NOTE — Progress Notes (Signed)
Per MD, okay to give patient a one time dose of Morphine in between prn doses

## 2017-02-10 NOTE — Progress Notes (Signed)
PROGRESS NOTE    Alyssa [name]  VVZ:[id number] DOB: [birth year] DOA: 02/09/2017 PCP: Haywood Pao, MD  Outpatient Specialists:     Brief Narrative:  71 year old female Known bipolar Type 2 diabetes mellitus Glaucoma Chronic pain in lower back status post lumbar surgery 11/2007 BMI >30 Chronic kidney disease stage III  Chronic diastolic heart failure History of pleural effusion Anemia of chronic  Hypothyroidism Bilateral tremor followed by neurology  Prior episodic admission 02/02/2017 for syncope-felt to be vasovagal  Also felt to be been secondary to high-dose diuretic  Has been admitted in the past for right thoracentesis and acute hypoxic respiratory failure March 2015  Nuclear stress at the time was negative   Admitted 7/21 with new-onset abdominal pain and discomfort Found to have diverticulitis with microperforation General surgery consulted    Assessment & Plan:   Principal Problem:   Diverticulitis Active Problems:   Hypertension   Bipolar 1 disorder ([place])   Morbid obesity (BMI 39.21)    Depression, major, in partial remission (H[facility])   CKD (chronic kidney disease) stage 3, GFR 30-59 ml/min   Hypothyroidism   T2_NIDDM   Anemia   Congestive heart failure (CHF) (H[facility])   Diverticulitis, acute Chronic underlying pain  White count 8--->12.3  Continue Zosyn for now   Patient uncomfortable  Low threshold to reimage if she does not get better by this evening-nursing aware to discuss with general surgeon in that regard   Normocytic anemia  Hemoglobin usually 9 range  Down to 8.6  Trend, likely secondary to diverticulitis  Chronic low back.  Last lumbar surgery 11/2007  Was taken off gabapentin and muscle relaxants after last hospital stay  Chronic kidney disease stage 3-4 Possible iatrogenic syncope secondary to volume depletion-recent admission 01/2017  Follows as an outpatient with nephrologist  We'll hold off on Lasix 80 twice a day at this  time  Diastolic heart failure nonacute HTN  Continue Coreg 3.125 3 times a day, holding BiDil 20/3.75 for now  Holding minoxidil 5 milligrams p.m.   Diabetes mellitus type 2, complication neuropathy, nephropathy  Holding glipizide 2.5 mg a.m.  Continue Lantus 5 units at bedtime  With sliding scale coverage blood sugar  107-217  Lovenox Inpatient Telemetry Discussed with daughter at bedside-hopeful for improvement-unclear at this time if we'll need surgery. Needs careful periodic recheck  Consultants:   General surgery  Procedures:   CT scan  Antimicrobials:   Zosyn 7/21    Subjective: Uncomfortable and in pain Rates pain as 6/10, crampy lower quadrant When I ask her to point to the area of pain she points to her right lower quadrant however when I examine her she is very tender over the epigastrium and the umbilicus When I shake her hand she also says that this causes abdominal pain for some reason  Objective: Vitals:   02/09/17 1530 02/09/17 1617 02/09/17 2102 02/10/17 0519  BP: 129/61 (!) 140/48 116/63 (!) 148/80  Pulse: 83 82 84 73  Resp: 14 16 18 19   Temp:  97.7 F (36.5 C) 98.2 F (36.8 C) 98.3 F (36.8 C)  TempSrc:  Oral Oral Oral  SpO2: 98% 97% 97% 99%  Weight:    86.6 kg (190 lb 14.4 oz)  Height:        Intake/Output Summary (Last 24 hours) at 02/10/17 1045 Last data filed at 02/10/17 0519  Gross per 24 hour  Intake              550 ml  Output              500 ml  Net               50 ml   Filed Weights   02/09/17 1136 02/10/17 0519  Weight: 79.4 kg (175 lb) 86.6 kg (190 lb 14.4 oz)    Examination:  Flat affect, uncomfortable appearing, No pallor no exudate Chest is clear Abdomen is distended, bowel sounds are distant, no organomegaly appreciated Tender over umbilicus and epigastrium Neurologically is intact moving all 4 limbs cranial nerves are grossly intact to cursory exam No lower extremity swelling No rash   Data Reviewed: I  have personally reviewed following labs and imaging studies  CBC:  Recent Labs Lab 02/09/17 1246 02/10/17 0604  WBC 8.3 12.5*  NEUTROABS 7.5  --   HGB 9.8* 8.6*  HCT 29.9* 26.3*  MCV 87.2 87.7  PLT 298 179   Basic Metabolic Panel:  Recent Labs Lab 02/09/17 1246 02/09/17 1531 02/10/17 0604  NA 131*  --  136  K 3.6  --  3.5  CL 96*  --  102  CO2 23  --  22  GLUCOSE 217*  --  179*  BUN 70*  --  67*  CREATININE 4.25* 4.28* 4.03*  CALCIUM 9.9  --  8.8*   GFR: Estimated Creatinine Clearance: 13.2 mL/min (A) (by C-G formula based on SCr of 4.03 mg/dL (H)). Liver Function Tests:  Recent Labs Lab 02/09/17 1246 02/10/17 0604  AST 22 30  ALT 16 23  ALKPHOS 64 73  BILITOT 0.7 0.8  PROT 6.5 6.6  ALBUMIN 3.3* 2.6*    Recent Labs Lab 02/09/17 1246  LIPASE 22   No results for input(s): AMMONIA in the last 168 hours. Coagulation Profile: No results for input(s): INR, PROTIME in the last 168 hours. Cardiac Enzymes: No results for input(s): CKTOTAL, CKMB, CKMBINDEX, TROPONINI in the last 168 hours. BNP (last 3 results) No results for input(s): PROBNP in the last 8760 hours. HbA1C: No results for input(s): HGBA1C in the last 72 hours. CBG:  Recent Labs Lab 02/09/17 1719 02/09/17 2100 02/10/17 0748  GLUCAP 163* 109* 154*   Lipid Profile: No results for input(s): CHOL, HDL, LDLCALC, TRIG, CHOLHDL, LDLDIRECT in the last 72 hours. Thyroid Function Tests: No results for input(s): TSH, T4TOTAL, FREET4, T3FREE, THYROIDAB in the last 72 hours. Anemia Panel: No results for input(s): VITAMINB12, FOLATE, FERRITIN, TIBC, IRON, RETICCTPCT in the last 72 hours. Urine analysis:    Component Value Date/Time   COLORURINE YELLOW 02/10/2017 0535   APPEARANCEUR CLOUDY (A) 02/10/2017 0535   LABSPEC 1.014 02/10/2017 0535   PHURINE 5.0 02/10/2017 0535   GLUCOSEU NEGATIVE 02/10/2017 0535   HGBUR NEGATIVE 02/10/2017 0535   BILIRUBINUR NEGATIVE 02/10/2017 0535   KETONESUR  NEGATIVE 02/10/2017 0535   PROTEINUR 100 (A) 02/10/2017 0535   UROBILINOGEN 0.2 02/14/2015 1157   NITRITE NEGATIVE 02/10/2017 0535   LEUKOCYTESUR MODERATE (A) 02/10/2017 0535   Sepsis Labs: @LABRCNTIP (procalcitonin:4,lacticidven:4)  ) Recent Results (from the past 240 hour(s))  Culture, blood (routine x 2)     Status: None   Collection Time: 02/01/17 11:50 AM  Result Value Ref Range Status   Specimen Description BLOOD RIGHT ANTECUBITAL  Final   Special Requests   Final    BOTTLES DRAWN AEROBIC AND ANAEROBIC Blood Culture adequate volume   Culture NO GROWTH 5 DAYS  Final   Report Status 02/06/2017 FINAL  Final  Culture, blood (routine x 2)  Status: None   Collection Time: 02/01/17 11:58 AM  Result Value Ref Range Status   Specimen Description BLOOD RIGHT HAND  Final   Special Requests   Final    BOTTLES DRAWN AEROBIC AND ANAEROBIC Blood Culture adequate volume   Culture NO GROWTH 5 DAYS  Final   Report Status 02/06/2017 FINAL  Final  Culture, blood (Routine X 2) w Reflex to ID Panel     Status: None (Preliminary result)   Collection Time: 02/09/17  3:40 PM  Result Value Ref Range Status   Specimen Description BLOOD RIGHT ANTECUBITAL  Final   Special Requests   Final    BOTTLES DRAWN AEROBIC AND ANAEROBIC Blood Culture adequate volume   Culture NO GROWTH < 24 HOURS  Final   Report Status PENDING  Incomplete  Culture, blood (Routine X 2) w Reflex to ID Panel     Status: None (Preliminary result)   Collection Time: 02/09/17  6:35 PM  Result Value Ref Range Status   Specimen Description BLOOD RIGHT HAND  Final   Special Requests   Final    BOTTLES DRAWN AEROBIC AND ANAEROBIC Blood Culture results may not be optimal due to an inadequate volume of blood received in culture bottles   Culture NO GROWTH < 24 HOURS  Final   Report Status PENDING  Incomplete         Radiology Studies: Ct Abdomen Pelvis Wo Contrast  Result Date: 02/09/2017 CLINICAL DATA:  Cramping abdominal  pain since 0900 hours, constipation for 7 days, no relief with laxatives, diabetes mellitus, hypertension, abnormal renal function with low GFR EXAM: CT ABDOMEN AND PELVIS WITHOUT CONTRAST TECHNIQUE: Multidetector CT imaging of the abdomen and pelvis was performed following the standard protocol without IV contrast. Sagittal and coronal MPR images reconstructed from axial data set. Oral contrast was not administered. COMPARISON:  10/05/2014 FINDINGS: Lower chest: Lung bases clear Hepatobiliary: Distended gallbladder with dependent calculi. No definite pericholecystic infiltration. Liver unremarkable. Pancreas: Atrophic pancreas without mass Spleen: Normal appearance Adrenals/Urinary Tract: Adrenal thickening without discrete mass. Unremarkable kidneys without mass or hydronephrosis. No ureteral calcification or dilatation definitely visualized. Bladder contains only a small amount of urine. Stomach/Bowel: Normal appendix. Diffuse colonic diverticulosis. Wall thickening of sigmoid colon with adjacent infiltrative changes suspicious for acute diverticulitis. Inflammatory changes extend adjacent to the uterus. No abscess. Small collection of gas is seen posterior to the uterus on the RIGHT could potentially represent extraluminal gas arising from the sigmoid colon inflammatory process. Stomach and remaining bowel loops unremarkable. No abnormal retained stool burden identified. Vascular/Lymphatic: Extensive atherosclerotic calcifications aorta and abdominal vessels without aneurysm. Coronary arterial calcifications. Minimal pericardial fluid. Reproductive: Calcified leiomyomata within uterus. Ovaries unremarkable. Other: No free intraperitoneal air or fluid.  No hernia. Musculoskeletal: Osseous demineralization with prior lower lumbar fusion. IMPRESSION: Diffuse colonic diverticulosis with sigmoid wall thickening and pericolic and treat changes consistent with acute diverticulitis. Edematous changes extend adjacent to  the uterus with a small focal gas collection is seen posterior to the uterus, could represent extraluminal gas arising from perforated sigmoid diverticulitis but no abscess collection is identified. Calcified uterine leiomyomata. Aortic Atherosclerosis (ICD10-I70.0). Electronically Signed   By: Lavonia Dana M.D.   On: 02/09/2017 13:46   Dg Chest Port 1 View  Result Date: 02/09/2017 CLINICAL DATA:  Abdominal pain 5 days with vomiting this morning. EXAM: PORTABLE CHEST 1 VIEW COMPARISON:  02/01/2017, 01/11/2015 and 10/09/2013 as well as chest CT 01/04/2017 FINDINGS: Lungs are adequately inflated without focal consolidation or effusion.  Stable prominence of the right hilum. Cardiac silhouette is within normal. Prominence of the main pulmonary segment unchanged. Mild calcified plaque over the thoracic aorta. Degenerative change of the spine and shoulders. IMPRESSION: No acute cardiopulmonary disease. Findings suggesting pulmonary arterial hypertension. Aortic Atherosclerosis (ICD10-I70.0). Electronically Signed   By: Marin Olp M.D.   On: 02/09/2017 15:17        Scheduled Meds: . carvedilol  3.125 mg Oral BID WC  . insulin aspart  0-15 Units Subcutaneous TID WC  . insulin aspart  0-5 Units Subcutaneous QHS  . insulin glargine  5 Units Subcutaneous QHS  . latanoprost  1 drop Both Eyes QPM  . levothyroxine  75 mcg Oral QAC breakfast  . timolol  1 drop Both Eyes BID   Continuous Infusions: . piperacillin-tazobactam (ZOSYN)  IV 2.25 g (02/10/17 0516)     LOS: 1 day    Time spent: [place], MD [facility] ([phone]   If 7PM-7AM, please contact night-coverage [email or web address] Password [facility] 02/10/2017, 10:45 AM

## 2017-02-11 ENCOUNTER — Other Ambulatory Visit: Payer: Self-pay

## 2017-02-11 LAB — CBC WITH DIFFERENTIAL/PLATELET
Basophils Absolute: 0 10*3/uL (ref 0.0–0.1)
Basophils Relative: 0 %
EOS ABS: 0 10*3/uL (ref 0.0–0.7)
Eosinophils Relative: 0 %
HEMATOCRIT: 26.2 % — AB (ref 36.0–46.0)
HEMOGLOBIN: 8.6 g/dL — AB (ref 12.0–15.0)
LYMPHS ABS: 0.9 10*3/uL (ref 0.7–4.0)
LYMPHS PCT: 7 %
MCH: 28.4 pg (ref 26.0–34.0)
MCHC: 32.8 g/dL (ref 30.0–36.0)
MCV: 86.5 fL (ref 78.0–100.0)
Monocytes Absolute: 0.5 10*3/uL (ref 0.1–1.0)
Monocytes Relative: 3 %
NEUTROS ABS: 12.2 10*3/uL — AB (ref 1.7–7.7)
NEUTROS PCT: 90 %
Platelets: 303 10*3/uL (ref 150–400)
RBC: 3.03 MIL/uL — AB (ref 3.87–5.11)
RDW: 14.7 % (ref 11.5–15.5)
WBC: 13.6 10*3/uL — AB (ref 4.0–10.5)

## 2017-02-11 LAB — BASIC METABOLIC PANEL
ANION GAP: 14 (ref 5–15)
BUN: 80 mg/dL — ABNORMAL HIGH (ref 6–20)
CHLORIDE: 103 mmol/L (ref 101–111)
CO2: 20 mmol/L — ABNORMAL LOW (ref 22–32)
CREATININE: 4.53 mg/dL — AB (ref 0.44–1.00)
Calcium: 9.5 mg/dL (ref 8.9–10.3)
GFR calc non Af Amer: 9 mL/min — ABNORMAL LOW (ref >60)
GFR, EST AFRICAN AMERICAN: 10 mL/min — AB (ref >60)
Glucose, Bld: 128 mg/dL — ABNORMAL HIGH (ref 65–99)
POTASSIUM: 3.6 mmol/L (ref 3.5–5.1)
SODIUM: 137 mmol/L (ref 135–145)

## 2017-02-11 LAB — GLUCOSE, CAPILLARY
GLUCOSE-CAPILLARY: 113 mg/dL — AB (ref 65–99)
GLUCOSE-CAPILLARY: 110 mg/dL — AB (ref 65–99)
GLUCOSE-CAPILLARY: 126 mg/dL — AB (ref 65–99)
Glucose-Capillary: 112 mg/dL — ABNORMAL HIGH (ref 65–99)

## 2017-02-11 NOTE — Progress Notes (Signed)
CC:  Abdominal pain  Subjective: Feeling much better, tolerating ice chips.  She had diffuse pain on admit worse RLQ.  Overall much better now.  I don't get the sense that any one place is more tender than the other.    Objective: Vital signs in last 24 hours: Temp:  [97.8 F (36.6 C)-98.9 F (37.2 C)] 97.8 F (36.6 C) (07/23 0640) Pulse Rate:  [63-78] 68 (07/23 0640) Resp:  [18] 18 (07/23 0640) BP: (116-137)/(52-60) 137/60 (07/23 0640) SpO2:  [98 %-99 %] 98 % (07/23 0640) Last BM Date:  (1 week ago per patient) NPO 750 IV Urine 950 Afebrile, VSS Labs show creatinine is up but stable  CT scan 02/09/17:Diffuse colonic diverticulosis with sigmoid wall thickening and pericolic and treat changes consistent with acute diverticulitis. Edematous changes extend adjacent to the uterus with a small focal gas collection is seen posterior to the uterus, could represent extraluminal gas arising from perforated sigmoid diverticulitis but no abscess collection is identified.    Intake/Output from previous day: 07/22 0701 - 07/23 0700 In: 750 [I.V.:750] Out: 950 [Urine:950] Intake/Output this shift: No intake/output data recorded.  General appearance: alert, cooperative and no distress GI: soft, tenderness much better, still rather generalized. few BS,   Lab Results:   Recent Labs  02/10/17 0604 02/11/17 0527  WBC 12.5* 13.6*  HGB 8.6* 8.6*  HCT 26.3* 26.2*  PLT 272 303    BMET  Recent Labs  02/10/17 0604 02/11/17 0527  NA 136 137  K 3.5 3.6  CL 102 103  CO2 22 20*  GLUCOSE 179* 128*  BUN 67* 80*  CREATININE 4.03* 4.53*  CALCIUM 8.8* 9.5   PT/INR No results for input(s): LABPROT, INR in the last 72 hours.   Recent Labs Lab 02/09/17 1246 02/10/17 0604  AST 22 30  ALT 16 23  ALKPHOS 64 73  BILITOT 0.7 0.8  PROT 6.5 6.6  ALBUMIN 3.3* 2.6*     Lipase     Component Value Date/Time   LIPASE 22 02/09/2017 1246   Prior to Admission  medications   Medication Sig Start Date End Date Taking? Authorizing Provider  acetaminophen (TYLENOL) 650 MG CR tablet Take 1,300-1,950 mg by mouth 2 (two) times daily as needed for pain.   Yes [provider]  aspirin 81 MG tablet Take 81 mg by mouth daily.   Yes [provider]  carvedilol (COREG) 3.125 MG tablet Take 1 tablet (3.125 mg total) by mouth 2 (two) times daily with a meal. 01/12/17  Yes Florencia Reasons, MD  Cholecalciferol (VITAMIN D-3 PO) Take 5,000 Units by mouth every evening.    Yes [provider]  Cyanocobalamin (VITAMIN B 12 PO) Take 1,000 mcg by mouth every morning.    Yes [provider]  docusate sodium (COLACE) 100 MG capsule Take 100 mg by mouth daily as needed for mild constipation.   Yes [provider]  ferrous sulfate 325 (65 FE) MG tablet Take 325 mg by mouth daily with breakfast.   Yes [provider]  furosemide (LASIX) 40 MG tablet Take 2 tablets (80 mg total) by mouth 2 (two) times daily. 02/02/17  Yes Bonnielee Haff, MD  glipiZIDE (GLUCOTROL) 5 MG tablet Take 0.5 tablets (2.5 mg total) by mouth daily before breakfast. 01/12/17 01/12/18 Yes Florencia Reasons, MD  insulin glargine (LANTUS) 100 UNIT/ML injection Inject 0.05 mLs (5 Units total) into the skin at bedtime. 01/12/17  Yes Florencia Reasons, MD  isosorbide-hydrALAZINE [facility])  20-37.5 MG tablet Take 1 tablet by mouth 3 (three) times daily. 01/12/17  Yes Florencia Reasons, MD  latanoprost (XALATAN) 0.005 % ophthalmic solution Place 1 drop into both eyes every evening.  03/31/14  Yes [provider]  levothyroxine (SYNTHROID, LEVOTHROID) 75 MCG tablet Take 1 tablet (75 mcg total) by mouth daily before breakfast. 01/13/17  Yes Florencia Reasons, MD  loratadine (CLARITIN) 10 MG tablet Take 10 mg by mouth every evening.   Yes [provider]  minoxidil (LONITEN) 10 MG tablet TAKE 1/2 TO 1 TABLET BY MOUTH DAILY AS DIRECTED FOR BLOOD PRESSURE Patient taking differently: Take 10 mg by mouth in  the morning 09/20/16  Yes Vicie Mutters, PA-C  Multiple Vitamin (MULTIVITAMIN) tablet Take 1 tablet by mouth daily.   Yes [provider]  Omega-3 Fatty Acids (FISH OIL) 1000 MG CAPS Take 1,000 mg by mouth every evening.    Yes [provider]  polyethylene glycol (MIRALAX / GLYCOLAX) packet Take 17 g by mouth daily as needed for mild constipation.   Yes [provider]  timolol (TIMOPTIC) 0.5 % ophthalmic solution INSTILL 1 DROP IN BOTH EYES TWICE DAILY 10/21/15  Yes Unk Pinto, MD  ACCU-CHEK AVIVA PLUS test strip USE TO TEST BLOOD SUGAR THREE TIMES DAILY 06/28/16   Forcucci, Loma Sousa, PA-C  Blood Glucose Monitoring Suppl (ACCU-CHEK AVIVA PLUS) W/DEVICE KIT  09/13/14   [provider]  Lancets (FREESTYLE) lancets Use as instructed 09/15/15   Starlyn Skeans, PA-C      Medications: . carvedilol  3.125 mg Oral BID WC  . insulin aspart  0-15 Units Subcutaneous TID WC  . insulin aspart  0-5 Units Subcutaneous QHS  . insulin glargine  5 Units Subcutaneous QHS  . latanoprost  1 drop Both Eyes QPM  . levothyroxine  75 mcg Oral QAC breakfast  . timolol  1 drop Both Eyes BID   . sodium chloride 75 mL/hr at 02/11/17 0615  . piperacillin-tazobactam (ZOSYN)  IV Stopped (02/11/17 7544)   Anti-infectives    Start     Dose/Rate Route Frequency Ordered Stop   02/09/17 2300  piperacillin-tazobactam (ZOSYN) IVPB 2.25 g     2.25 g 100 mL/hr over 30 Minutes Intravenous Every 8 hours 02/09/17 1454     02/09/17 1400  piperacillin-tazobactam (ZOSYN) IVPB 3.375 g     3.375 g 100 mL/hr over 30 Minutes Intravenous  Once 02/09/17 1355 02/09/17 1512     Assessment/Plan Diverticulitis Chronic kidney disease - Creatinine around 4 since June 2018 Echo 01/05/17:  EF 92-01% grade 1 diastolic dysfunction Bipolar disorder AODM Type II Chronic back pain Hypothyroid Body mass index is 33.8 Hypertension Glaucoma  Hx of anemia FEN:  NPO/IV fluids ID:  Zosyn 02/09/17 =>>  day 3 DVT:  SCD - she can be on anticoagulant from our standpoint.     Plan:  Continue current course.  Recheck labs in AM.  If WBC continues to climb consider repeat CT, original study 02/09/17.   LOS: 2 days    Naftula Donahue 02/11/2017 [phone]

## 2017-02-11 NOTE — Progress Notes (Signed)
PROGRESS NOTE    Alyssa [name]  ONG:[id number] DOB: 06-27-1946 DOA: 02/09/2017 PCP: Haywood Pao, MD  Outpatient Specialists:     Brief Narrative:  71 year old female Known bipolar Type 2 diabetes mellitus Glaucoma Chronic pain in lower back status post lumbar surgery 11/2007 BMI >30 Chronic kidney disease stage III  Chronic diastolic heart failure History of pleural effusion Anemia of chronic  Hypothyroidism Bilateral tremor followed by neurology  Prior episodic admission 02/02/2017 for syncope-felt to be vasovagal  Also felt to be been secondary to high-dose diuretic  Has been admitted in the past for right thoracentesis and acute hypoxic respiratory failure March 2015  Nuclear stress at the time was negative   Admitted 7/21 with new-onset abdominal pain and discomfort Found to have diverticulitis with microperforation General surgery consulted    Assessment & Plan:   Principal Problem:   Diverticulitis Active Problems:   Hypertension   Bipolar 1 disorder ([place])   Morbid obesity (BMI 39.21)    Depression, major, in partial remission (HCC)   CKD (chronic kidney disease) stage 3, GFR 30-59 ml/min   Hypothyroidism   T2_NIDDM   Anemia   Congestive heart failure (CHF) ([place])   Diverticulitis, acute Chronic underlying pain  White count 8--->12.3-->13 0.6  Continue Zosyn for now  Blood culture X2 7/21 no growth to date  Patient improved and in nad  Pain management with IV morphine but has not used since yesterday 7/22  Keep nothing by mouth as per general surgery instructions   Normocytic anemia  Hemoglobin usually 9 range  Down to 8.6  Trend, likely secondary to diverticulitis  Hold off iron 325 for now  Chronic low back.  Last lumbar surgery 11/2007  Was taken off gabapentin and muscle relaxants after last hospital stay  Chronic kidney disease stage 3-4, baseline creatinine in the 4 range Possible iatrogenic syncope secondary to volume  depletion-recent admission 01/2017  Follows as an outpatient with nephrologist  We'll hold off on Lasix 80 twice a day at this time  Continue IV N S 75 cc/H  Diastolic heart failure nonacute HTN  Continue Coreg 3.125 3 times a day,   holding BiDil 20/3.75 for now  Holding minoxidil 5 milligrams p.m.   Diabetes mellitus type 2, complication neuropathy, nephropathy  Holding glipizide 2.5 mg a.m.  Continue Lantus 5 units at bedtime  With sliding scale coverage blood sugar   Lovenox Inpatient Telemetry Likely needs one to 2 more days of IV antibiotics and graduation of diet-general surgery input appreciated  Consultants:   General surgery  Procedures:   CT scan  Antimicrobials:   Zosyn 7/21    Subjective:  Pain is minimal today Out of bed sitting in the chair, smiling Not passing gas but burping a lot The patient her abdomen feels a lot softer and less discomfort  Objective: Vitals:   02/10/17 0519 02/10/17 1332 02/10/17 2125 02/11/17 0640  BP: (!) 148/80 (!) 116/52 (!) 130/59 137/60  Pulse: 73 63 78 68  Resp: 19 18 18 18   Temp: 98.3 F (36.8 C) 98.9 F (37.2 C) 98.8 F (37.1 C) 97.8 F (36.6 C)  TempSrc: Oral Oral Oral   SpO2: 99% 99% 98% 98%  Weight: 86.6 kg (190 lb 14.4 oz)     Height:        Intake/Output Summary (Last 24 hours) at 02/11/17 1053 Last data filed at 02/11/17 0615  Gross per 24 hour  Intake  750 ml  Output              950 ml  Net             -200 ml   Filed Weights   02/09/17 1136 02/10/17 0519  Weight: 79.4 kg (175 lb) 86.6 kg (190 lb 14.4 oz)    Examination:  Smiling pleasant Mallampati 3 External ocular movements are intact No icterus Chest is clinically clear Abdomen soft nontender bowel sounds are still distant however her  Lower quadrant right side is painful on pressure no rebound S1-S2 no murmur rub or gallop No lower extremity edema   Data Reviewed: I have personally reviewed following labs and  imaging studies  CBC:  Recent Labs Lab 02/09/17 1246 02/10/17 0604 02/11/17 0527  WBC 8.3 12.5* 13.6*  NEUTROABS 7.5  --  12.2*  HGB 9.8* 8.6* 8.6*  HCT 29.9* 26.3* 26.2*  MCV 87.2 87.7 86.5  PLT 298 272 527   Basic Metabolic Panel:  Recent Labs Lab 02/09/17 1246 02/09/17 1531 02/10/17 0604 02/11/17 0527  NA 131*  --  136 137  K 3.6  --  3.5 3.6  CL 96*  --  102 103  CO2 23  --  22 20*  GLUCOSE 217*  --  179* 128*  BUN 70*  --  67* 80*  CREATININE 4.25* 4.28* 4.03* 4.53*  CALCIUM 9.9  --  8.8* 9.5   GFR: Estimated Creatinine Clearance: 11.7 mL/min (A) (by C-G formula based on SCr of 4.53 mg/dL (H)). Liver Function Tests:  Recent Labs Lab 02/09/17 1246 02/10/17 0604  AST 22 30  ALT 16 23  ALKPHOS 64 73  BILITOT 0.7 0.8  PROT 6.5 6.6  ALBUMIN 3.3* 2.6*    Recent Labs Lab 02/09/17 1246  LIPASE 22   No results for input(s): AMMONIA in the last 168 hours. Coagulation Profile: No results for input(s): INR, PROTIME in the last 168 hours. Cardiac Enzymes: No results for input(s): CKTOTAL, CKMB, CKMBINDEX, TROPONINI in the last 168 hours. BNP (last 3 results) No results for input(s): PROBNP in the last 8760 hours. HbA1C: No results for input(s): HGBA1C in the last 72 hours. CBG:  Recent Labs Lab 02/10/17 0748 02/10/17 1148 02/10/17 1700 02/10/17 2137 02/11/17 0727  GLUCAP 154* 82 101* 135* 113*   Lipid Profile: No results for input(s): CHOL, HDL, LDLCALC, TRIG, CHOLHDL, LDLDIRECT in the last 72 hours. Thyroid Function Tests: No results for input(s): TSH, T4TOTAL, FREET4, T3FREE, THYROIDAB in the last 72 hours. Anemia Panel: No results for input(s): VITAMINB12, FOLATE, FERRITIN, TIBC, IRON, RETICCTPCT in the last 72 hours. Urine analysis:    Component Value Date/Time   COLORURINE YELLOW 02/10/2017 0535   APPEARANCEUR CLOUDY (A) 02/10/2017 0535   LABSPEC 1.014 02/10/2017 0535   PHURINE 5.0 02/10/2017 0535   GLUCOSEU NEGATIVE 02/10/2017 0535     HGBUR NEGATIVE 02/10/2017 0535   BILIRUBINUR NEGATIVE 02/10/2017 0535   KETONESUR NEGATIVE 02/10/2017 0535   PROTEINUR 100 (A) 02/10/2017 0535   UROBILINOGEN 0.2 02/14/2015 1157   NITRITE NEGATIVE 02/10/2017 0535   LEUKOCYTESUR MODERATE (A) 02/10/2017 0535   Sepsis Labs: @LABRCNTIP (procalcitonin:4,lacticidven:4)  ) Recent Results (from the past 240 hour(s))  Culture, blood (routine x 2)     Status: None   Collection Time: 02/01/17 11:50 AM  Result Value Ref Range Status   Specimen Description BLOOD RIGHT ANTECUBITAL  Final   Special Requests   Final    BOTTLES DRAWN AEROBIC AND ANAEROBIC Blood Culture  adequate volume   Culture NO GROWTH 5 DAYS  Final   Report Status 02/06/2017 FINAL  Final  Culture, blood (routine x 2)     Status: None   Collection Time: 02/01/17 11:58 AM  Result Value Ref Range Status   Specimen Description BLOOD RIGHT HAND  Final   Special Requests   Final    BOTTLES DRAWN AEROBIC AND ANAEROBIC Blood Culture adequate volume   Culture NO GROWTH 5 DAYS  Final   Report Status 02/06/2017 FINAL  Final  Culture, blood (Routine X 2) w Reflex to ID Panel     Status: None (Preliminary result)   Collection Time: 02/09/17  3:40 PM  Result Value Ref Range Status   Specimen Description BLOOD RIGHT ANTECUBITAL  Final   Special Requests   Final    BOTTLES DRAWN AEROBIC AND ANAEROBIC Blood Culture adequate volume   Culture NO GROWTH < 24 HOURS  Final   Report Status PENDING  Incomplete  Culture, blood (Routine X 2) w Reflex to ID Panel     Status: None (Preliminary result)   Collection Time: 02/09/17  6:35 PM  Result Value Ref Range Status   Specimen Description BLOOD RIGHT HAND  Final   Special Requests   Final    BOTTLES DRAWN AEROBIC AND ANAEROBIC Blood Culture results may not be optimal due to an inadequate volume of blood received in culture bottles   Culture NO GROWTH < 24 HOURS  Final   Report Status PENDING  Incomplete         Radiology Studies: Ct  Abdomen Pelvis Wo Contrast  Result Date: 02/09/2017 CLINICAL DATA:  Cramping abdominal pain since 0900 hours, constipation for 7 days, no relief with laxatives, diabetes mellitus, hypertension, abnormal renal function with low GFR EXAM: CT ABDOMEN AND PELVIS WITHOUT CONTRAST TECHNIQUE: Multidetector CT imaging of the abdomen and pelvis was performed following the standard protocol without IV contrast. Sagittal and coronal MPR images reconstructed from axial data set. Oral contrast was not administered. COMPARISON:  10/05/2014 FINDINGS: Lower chest: Lung bases clear Hepatobiliary: Distended gallbladder with dependent calculi. No definite pericholecystic infiltration. Liver unremarkable. Pancreas: Atrophic pancreas without mass Spleen: Normal appearance Adrenals/Urinary Tract: Adrenal thickening without discrete mass. Unremarkable kidneys without mass or hydronephrosis. No ureteral calcification or dilatation definitely visualized. Bladder contains only a small amount of urine. Stomach/Bowel: Normal appendix. Diffuse colonic diverticulosis. Wall thickening of sigmoid colon with adjacent infiltrative changes suspicious for acute diverticulitis. Inflammatory changes extend adjacent to the uterus. No abscess. Small collection of gas is seen posterior to the uterus on the RIGHT could potentially represent extraluminal gas arising from the sigmoid colon inflammatory process. Stomach and remaining bowel loops unremarkable. No abnormal retained stool burden identified. Vascular/Lymphatic: Extensive atherosclerotic calcifications aorta and abdominal vessels without aneurysm. Coronary arterial calcifications. Minimal pericardial fluid. Reproductive: Calcified leiomyomata within uterus. Ovaries unremarkable. Other: No free intraperitoneal air or fluid.  No hernia. Musculoskeletal: Osseous demineralization with prior lower lumbar fusion. IMPRESSION: Diffuse colonic diverticulosis with sigmoid wall thickening and pericolic and  treat changes consistent with acute diverticulitis. Edematous changes extend adjacent to the uterus with a small focal gas collection is seen posterior to the uterus, could represent extraluminal gas arising from perforated sigmoid diverticulitis but no abscess collection is identified. Calcified uterine leiomyomata. Aortic Atherosclerosis (ICD10-I70.0). Electronically Signed   By: Lavonia Dana M.D.   On: 02/09/2017 13:46   Dg Chest Port 1 View  Result Date: 02/09/2017 CLINICAL DATA:  Abdominal pain 5 days with vomiting  this morning. EXAM: PORTABLE CHEST 1 VIEW COMPARISON:  02/01/2017, 01/11/2015 and 10/09/2013 as well as chest CT 01/04/2017 FINDINGS: Lungs are adequately inflated without focal consolidation or effusion. Stable prominence of the right hilum. Cardiac silhouette is within normal. Prominence of the main pulmonary segment unchanged. Mild calcified plaque over the thoracic aorta. Degenerative change of the spine and shoulders. IMPRESSION: No acute cardiopulmonary disease. Findings suggesting pulmonary arterial hypertension. Aortic Atherosclerosis (ICD10-I70.0). Electronically Signed   By: Marin Olp M.D.   On: 02/09/2017 15:17        Scheduled Meds: . carvedilol  3.125 mg Oral BID WC  . insulin aspart  0-15 Units Subcutaneous TID WC  . insulin aspart  0-5 Units Subcutaneous QHS  . insulin glargine  5 Units Subcutaneous QHS  . latanoprost  1 drop Both Eyes QPM  . levothyroxine  75 mcg Oral QAC breakfast  . timolol  1 drop Both Eyes BID   Continuous Infusions: . sodium chloride 75 mL/hr at 02/11/17 0615  . piperacillin-tazobactam (ZOSYN)  IV Stopped (02/11/17 1610)     LOS: 2 days    Time spent: [place], MD [facility]   If 7PM-7AM, please contact night-coverage [email or web address] Password [facility] 02/11/2017, 10:53 AM

## 2017-02-11 NOTE — Consult Note (Signed)
   [facility] Inpatient Consult   02/11/2017  Alyssa [name] Apr 12, 1946 [id number]   Made aware of hospitalization by [facility] RNCM. Please see chart review then encounters for further patient outreach details. Alyssa [name] has been followed by [place] Management.   Went to beside to discuss whether patient desires ongoing [place] Management follow up. However, she was in the bathroom. Will come back at later time.   Marthenia Rolling, MSN-Ed, RN,BSN [facility] Liaison [phone]

## 2017-02-11 NOTE — Patient Outreach (Signed)
[facility]) Care Management  02/11/17  LY WASS [birth year] [id number]  Patient readmitted 02/09/2017.  Per H&P: "71 year old with chronic kidney disease stage III, hypertension, diabetes, anemia, hypothyroidism. Patient seen with intermittent abdominal pain that started last week but became persistent this morning. Her pain this morning was sharp and throughout her entire abdomen. CT shows diverticulitis with Vicryl purse. General surgery consulted."  Will notify [facility] hospital liaisons of admission.  [place] R. Willona Phariss, RN, BSN, [place] Management Coordinator [phone]

## 2017-02-12 LAB — CBC WITH DIFFERENTIAL/PLATELET
BASOS ABS: 0 10*3/uL (ref 0.0–0.1)
BASOS PCT: 0 %
EOS ABS: 0.1 10*3/uL (ref 0.0–0.7)
EOS PCT: 1 %
HEMATOCRIT: 28.1 % — AB (ref 36.0–46.0)
Hemoglobin: 8.9 g/dL — ABNORMAL LOW (ref 12.0–15.0)
Lymphocytes Relative: 8 %
Lymphs Abs: 1 10*3/uL (ref 0.7–4.0)
MCH: 27.7 pg (ref 26.0–34.0)
MCHC: 31.7 g/dL (ref 30.0–36.0)
MCV: 87.5 fL (ref 78.0–100.0)
MONO ABS: 0.4 10*3/uL (ref 0.1–1.0)
Monocytes Relative: 4 %
NEUTROS ABS: 10.6 10*3/uL — AB (ref 1.7–7.7)
Neutrophils Relative %: 87 %
PLATELETS: 337 10*3/uL (ref 150–400)
RBC: 3.21 MIL/uL — ABNORMAL LOW (ref 3.87–5.11)
RDW: 14.9 % (ref 11.5–15.5)
WBC: 12.1 10*3/uL — ABNORMAL HIGH (ref 4.0–10.5)

## 2017-02-12 LAB — BASIC METABOLIC PANEL
ANION GAP: 13 (ref 5–15)
BUN: 83 mg/dL — ABNORMAL HIGH (ref 6–20)
CALCIUM: 9.8 mg/dL (ref 8.9–10.3)
CO2: 21 mmol/L — ABNORMAL LOW (ref 22–32)
CREATININE: 4.65 mg/dL — AB (ref 0.44–1.00)
Chloride: 105 mmol/L (ref 101–111)
GFR, EST AFRICAN AMERICAN: 10 mL/min — AB (ref >60)
GFR, EST NON AFRICAN AMERICAN: 9 mL/min — AB (ref >60)
Glucose, Bld: 106 mg/dL — ABNORMAL HIGH (ref 65–99)
Potassium: 3.5 mmol/L (ref 3.5–5.1)
SODIUM: 139 mmol/L (ref 135–145)

## 2017-02-12 LAB — GLUCOSE, CAPILLARY
GLUCOSE-CAPILLARY: 94 mg/dL (ref 65–99)
GLUCOSE-CAPILLARY: 142 mg/dL — AB (ref 65–99)
GLUCOSE-CAPILLARY: 123 mg/dL — AB (ref 65–99)
Glucose-Capillary: 159 mg/dL — ABNORMAL HIGH (ref 65–99)

## 2017-02-12 MED ORDER — BISACODYL 10 MG RE SUPP
10.0000 mg | Freq: Once | RECTAL | Status: AC
  Administered 2017-02-12: 10 mg via RECTAL
  Filled 2017-02-12: qty 1

## 2017-02-12 MED ORDER — ACETAMINOPHEN 325 MG PO TABS: 650.0000 mg | ORAL_TABLET | Freq: Four times a day (QID) | ORAL | Status: DC | PRN

## 2017-02-12 MED ORDER — TRAMADOL HCL 50 MG PO TABS
50.0000 mg | ORAL_TABLET | Freq: Four times a day (QID) | ORAL | Status: DC | PRN
  Administered 2017-02-14 – 2017-02-19 (×4): 50 mg via ORAL
  Filled 2017-02-12 (×5): qty 1

## 2017-02-12 NOTE — Progress Notes (Signed)
QP:[id number] pain  Subjective: She is annoyed this AM with hospital routine.  Says her abdomen is still sore but better than before.  She has not been out of the room.    Objective: Vital signs in last 24 hours: Temp:  [97.5 F (36.4 C)-98.5 F (36.9 C)] 97.5 F (36.4 C) (07/24 0635) Pulse Rate:  [62-65] 65 (07/24 0635) Resp:  [17-18] 18 (07/24 0635) BP: (131-152)/(47-79) 143/59 (07/24 0635) SpO2:  [98 %-100 %] 98 % (07/24 0635) Weight:  [87.2 kg (192 lb 4.8 oz)] 87.2 kg (192 lb 4.8 oz) (07/24 0635) Last BM Date:  (1 week ago per patient) NPO IV fluids 650 recorded Afebrile, VSS BMP stable, creatine still slowly rising WBC is better CT was 02/09/17  Intake/Output from previous day: 07/23 0701 - 07/24 0700 In: 600 [I.V.:600] Out: 650 [Urine:650] Intake/Output this shift: No intake/output data recorded.  General appearance: alert, cooperative and no distress Resp: clear to auscultation bilaterally GI: soft some mid abominal tenderness persist but she says it's better.    Lab Results:   Recent Labs  02/11/17 0527 02/12/17 0539  WBC 13.6* 12.1*  HGB 8.6* 8.9*  HCT 26.2* 28.1*  PLT 303 337    BMET  Recent Labs  02/11/17 0527 02/12/17 0539  NA 137 139  K 3.6 3.5  CL 103 105  CO2 20* 21*  GLUCOSE 128* 106*  BUN 80* 83*  CREATININE 4.53* 4.65*  CALCIUM 9.5 9.8   PT/INR No results for input(s): LABPROT, INR in the last 72 hours.   Recent Labs Lab 02/09/17 1246 02/10/17 0604  AST 22 30  ALT 16 23  ALKPHOS 64 73  BILITOT 0.7 0.8  PROT 6.5 6.6  ALBUMIN 3.3* 2.6*     Lipase     Component Value Date/Time   LIPASE 22 02/09/2017 1246     Medications: . carvedilol  3.125 mg Oral BID WC  . insulin aspart  0-15 Units Subcutaneous TID WC  . insulin aspart  0-5 Units Subcutaneous QHS  . insulin glargine  5 Units Subcutaneous QHS  . latanoprost  1 drop Both Eyes QPM  . levothyroxine  75 mcg Oral QAC breakfast  . timolol  1 drop Both Eyes BID    . sodium chloride 75 mL/hr at 02/12/17 0606  . piperacillin-tazobactam (ZOSYN)  IV Stopped (02/12/17 0606)   Anti-infectives    Start     Dose/Rate Route Frequency Ordered Stop   02/09/17 2300  piperacillin-tazobactam (ZOSYN) IVPB 2.25 g     2.25 g 100 mL/hr over 30 Minutes Intravenous Every 8 hours 02/09/17 1454     02/09/17 1400  piperacillin-tazobactam (ZOSYN) IVPB 3.375 g     3.375 g 100 mL/hr over 30 Minutes Intravenous  Once 02/09/17 1355 02/09/17 1512       Assessment/Plan Diverticulitis Chronic kidney disease - Creatinine around 4 since June 2018 Echo 01/05/17:  EF 59-93% grade 1 diastolic dysfunction Bipolar disorder AODM Type II Chronic back pain Hypothyroid Body mass index is 33.8 Hypertension Glaucoma  Hx of anemia FEN:  NPO/IV fluids ID:  Zosyn 02/09/17 =>> day 4 DVT:  SCD - she can be on anticoagulant from our standpoint    Plan:  Try her on some clears, WBC is a little better and her pain is still present.   Only 3 days since original CT Will get her up and moving in the halls and see if that helps.  Recheck labs in AM. Pt and daughter says  she can take Tylenol and Tramadol so I have added it.      LOS: 3 days    Gazelle Towe 02/12/2017 [phone]

## 2017-02-12 NOTE — Progress Notes (Signed)
Pharmacy Antibiotic Note  TAMULA MORRICAL is a 71 y.o. female admitted on 02/09/2017 with intra-abdominal infection.  Pharmacy has been consulted for Pip/Tazo dosing.   Continues on Zosyn for diverticulitis. No abscess on imaging. Trying conservative management for now. May need surgery soon if does not improve. Afebrile, WBC 13.6.   Plan: Continue Zosyn 2.25gm IV Q8h  Monitor clinical picture, renal function F/U abx deescalation / LOT  Height: 5\' 3"  (160 cm) Weight: 192 lb 4.8 oz (87.2 kg) IBW/kg (Calculated) : 52.4  Temp (24hrs), Avg:97.8 F (36.6 C), Min:97.5 F (36.4 C), Max:98.5 F (36.9 C)   Recent Labs Lab 02/09/17 1246 02/09/17 1531 02/09/17 1539 02/09/17 1828 02/10/17 0604 02/11/17 0527 02/12/17 0539  WBC 8.3  --   --   --  12.5* 13.6* 12.1*  CREATININE 4.25* 4.28*  --   --  4.03* 4.53* 4.65*  LATICACIDVEN  --  1.0 0.99 1.2  --   --   --     Estimated Creatinine Clearance: 11.4 mL/min (A) (by C-G formula based on SCr of 4.65 mg/dL (H)).    Allergies  Allergen Reactions  . Ciprofloxacin Hcl Diarrhea  . Cymbalta [Duloxetine Hcl] Other (See Comments)    "Ineffective"  . Lipitor [Atorvastatin] Other (See Comments)    Makes the patient "not feel right"  . Xanax [Alprazolam] Other (See Comments)    Excessive sleepiness  . Codeine     Reaction not recalled  . Lortab [Hydrocodone-Acetaminophen] Other (See Comments)    Reaction not recalled  . Oxycodone Other (See Comments)    Reaction not recalled     Antimicrobials this admission: Pip/Tazo 7/21 >>   Dose adjustments this admission: n/a  Microbiology results: 7/21 BCx: ngtd   Elenor Quinones, PharmD, BCPS Clinical Pharmacist Pager [phone] 02/12/2017 12:28 PM

## 2017-02-12 NOTE — Consult Note (Signed)
   [facility] CM Inpatient Consult   02/12/2017  Alyssa [name] Jul 09, 1946 [id number]   Follow up:  Attempted to speak with patient regarding ongoing follow up with [facility] Care management.  Patient and daughter in the room.  Patient up in chair and appears to be in pain.  Both states, "This is not a very good time, can you come another day?"  Will continue to monitor for progress and ongoing needs with [facility] Care Management.  For questions, changes or needs, please contact:  Natividad Brood, RN BSN [facility] Liaison  [phone] business mobile phone Toll free office [phone]

## 2017-02-12 NOTE — Progress Notes (Signed)
PROGRESS NOTE    Alyssa [name]  EXB:[id number] DOB: 02/08/1946 DOA: 02/09/2017 PCP: Haywood Pao, MD  Outpatient Specialists:     Brief Narrative:   71 year old female Known bipolar Type 2 diabetes mellitus Glaucoma Chronic pain in lower back status post lumbar surgery 11/2007 BMI >30 Chronic kidney disease stage III  Chronic diastolic heart failure History of pleural effusion Anemia of chronic  Hypothyroidism Bilateral tremor followed by neurology  Prior episodic admission 02/02/2017 for syncope-felt to be vasovagal  Also felt to be been secondary to high-dose diuretic  Has been admitted in the past for right thoracentesis and acute hypoxic respiratory failure March 2015  Nuclear stress at the time was negative   Admitted 7/21 with new-onset abdominal pain and discomfort Found to have diverticulitis with microperforation General surgery consulted    Assessment & Plan:   Principal Problem:   Diverticulitis Active Problems:   Hypertension   Bipolar 1 disorder ([place])   Morbid obesity (BMI 39.21)    Depression, major, in partial remission (HCC)   CKD (chronic kidney disease) stage 3, GFR 30-59 ml/min   Hypothyroidism   T2_NIDDM   Anemia   Congestive heart failure (CHF) ([place])   Diverticulitis, acute Chronic underlying pain  White count 8--->12.3-->13-->12  Continue Zosyn for now  Blood culture X2 7/21 no growth to date  Pain management with IV morphine but has not used since yesterday 7/22 --makes itch  gen surg added tylenol/tramadol  cld--rest per surgery   Normocytic anemia  Hemoglobin usually 9 range  Down to 8.6--9  Trend, likely secondary to diverticulitis  Hold off iron 325 for now  Chronic low back.  Last lumbar surgery 11/2007  Was taken off gabapentin and muscle relaxants after last hospital stay  Consider low dose robaxin on d/c home  Chronic kidney disease stage 3-4, baseline creatinine in the 4 range Possible iatrogenic syncope  secondary to volume depletion-recent admission 01/2017  Follows as an outpatient with nephrologist  We'll hold off on Lasix 80 twice a day at this time  Continue IV N S 75 cc/H  Needs OP neprho follow up-passing urine still  Diastolic heart failure nonacute HTN  Continue Coreg 3.125 3 times a day,   holding BiDil 20/3.75 for now  Holding minoxidil 5 milligrams p.m.   Diabetes mellitus type 2, complication neuropathy, nephropathy  Holding glipizide 2.5 mg a.m.  Continue Lantus 5 units at bedtime  sugars with SSI 94--142  Lovenox Inpatient Telemetry dispo per gen surgery in erms diet and Abx--improved but might still require surgery   Consultants:   General surgery  Procedures:   CT scan  Antimicrobials:   Zosyn 7/21    Subjective:  Awake alert anxious tol clear Feet a little shakey Slept poorly  Objective: Vitals:   02/11/17 0640 02/11/17 1350 02/11/17 2118 02/12/17 0635  BP: 137/60 (!) 131/47 (!) 152/79 (!) 143/59  Pulse: 68 62 63 65  Resp: 18 17 18 18   Temp: 97.8 F (36.6 C) 98.5 F (36.9 C) (!) 97.5 F (36.4 C) (!) 97.5 F (36.4 C)  TempSrc:  Oral Oral Oral  SpO2: 98% 99% 100% 98%  Weight:    87.2 kg (192 lb 4.8 oz)  Height:        Intake/Output Summary (Last 24 hours) at 02/12/17 1255 Last data filed at 02/12/17 0636  Gross per 24 hour  Intake              600 ml  Output  650 ml  Net              -50 ml   Filed Weights   02/09/17 1136 02/10/17 0519 02/12/17 0635  Weight: 79.4 kg (175 lb) 86.6 kg (190 lb 14.4 oz) 87.2 kg (192 lb 4.8 oz)    Examination:  Obese flat Pleasant eomi ncat No pallor Neck soft abd slight tedner RLQ No rebound no guard, good BS Le soft and nt   Data Reviewed: I have personally reviewed following labs and imaging studies  CBC:  Recent Labs Lab 02/09/17 1246 02/10/17 0604 02/11/17 0527 02/12/17 0539  WBC 8.3 12.5* 13.6* 12.1*  NEUTROABS 7.5  --  12.2* 10.6*  HGB 9.8* 8.6* 8.6* 8.9*  HCT  29.9* 26.3* 26.2* 28.1*  MCV 87.2 87.7 86.5 87.5  PLT 298 272 303 865   Basic Metabolic Panel:  Recent Labs Lab 02/09/17 1246 02/09/17 1531 02/10/17 0604 02/11/17 0527 02/12/17 0539  NA 131*  --  136 137 139  K 3.6  --  3.5 3.6 3.5  CL 96*  --  102 103 105  CO2 23  --  22 20* 21*  GLUCOSE 217*  --  179* 128* 106*  BUN 70*  --  67* 80* 83*  CREATININE 4.25* 4.28* 4.03* 4.53* 4.65*  CALCIUM 9.9  --  8.8* 9.5 9.8   GFR: Estimated Creatinine Clearance: 11.4 mL/min (A) (by C-G formula based on SCr of 4.65 mg/dL (H)). Liver Function Tests:  Recent Labs Lab 02/09/17 1246 02/10/17 0604  AST 22 30  ALT 16 23  ALKPHOS 64 73  BILITOT 0.7 0.8  PROT 6.5 6.6  ALBUMIN 3.3* 2.6*    Recent Labs Lab 02/09/17 1246  LIPASE 22   No results for input(s): AMMONIA in the last 168 hours. Coagulation Profile: No results for input(s): INR, PROTIME in the last 168 hours. Cardiac Enzymes: No results for input(s): CKTOTAL, CKMB, CKMBINDEX, TROPONINI in the last 168 hours. BNP (last 3 results) No results for input(s): PROBNP in the last 8760 hours. HbA1C: No results for input(s): HGBA1C in the last 72 hours. CBG:  Recent Labs Lab 02/11/17 1205 02/11/17 1725 02/11/17 2128 02/12/17 0802 02/12/17 1138  GLUCAP 110* 112* 126* 94 142*   Lipid Profile: No results for input(s): CHOL, HDL, LDLCALC, TRIG, CHOLHDL, LDLDIRECT in the last 72 hours. Thyroid Function Tests: No results for input(s): TSH, T4TOTAL, FREET4, T3FREE, THYROIDAB in the last 72 hours. Anemia Panel: No results for input(s): VITAMINB12, FOLATE, FERRITIN, TIBC, IRON, RETICCTPCT in the last 72 hours. Urine analysis:    Component Value Date/Time   COLORURINE YELLOW 02/10/2017 0535   APPEARANCEUR CLOUDY (A) 02/10/2017 0535   LABSPEC 1.014 02/10/2017 0535   PHURINE 5.0 02/10/2017 0535   GLUCOSEU NEGATIVE 02/10/2017 0535   HGBUR NEGATIVE 02/10/2017 0535   BILIRUBINUR NEGATIVE 02/10/2017 0535   KETONESUR NEGATIVE  02/10/2017 0535   PROTEINUR 100 (A) 02/10/2017 0535   UROBILINOGEN 0.2 02/14/2015 1157   NITRITE NEGATIVE 02/10/2017 0535   LEUKOCYTESUR MODERATE (A) 02/10/2017 0535   Sepsis Labs: @LABRCNTIP (procalcitonin:4,lacticidven:4)  ) Recent Results (from the past 240 hour(s))  Culture, blood (Routine X 2) w Reflex to ID Panel     Status: None (Preliminary result)   Collection Time: 02/09/17  3:40 PM  Result Value Ref Range Status   Specimen Description BLOOD RIGHT ANTECUBITAL  Final   Special Requests   Final    BOTTLES DRAWN AEROBIC AND ANAEROBIC Blood Culture adequate volume   Culture NO  GROWTH 3 DAYS  Final   Report Status PENDING  Incomplete  Culture, blood (Routine X 2) w Reflex to ID Panel     Status: None (Preliminary result)   Collection Time: 02/09/17  6:35 PM  Result Value Ref Range Status   Specimen Description BLOOD RIGHT HAND  Final   Special Requests   Final    BOTTLES DRAWN AEROBIC AND ANAEROBIC Blood Culture results may not be optimal due to an inadequate volume of blood received in culture bottles   Culture NO GROWTH 3 DAYS  Final   Report Status PENDING  Incomplete    Radiology Studies: No results found.  Scheduled Meds: . carvedilol  3.125 mg Oral BID WC  . insulin aspart  0-15 Units Subcutaneous TID WC  . insulin aspart  0-5 Units Subcutaneous QHS  . insulin glargine  5 Units Subcutaneous QHS  . latanoprost  1 drop Both Eyes QPM  . levothyroxine  75 mcg Oral QAC breakfast  . timolol  1 drop Both Eyes BID   Continuous Infusions: . sodium chloride 75 mL/hr at 02/12/17 1126  . piperacillin-tazobactam (ZOSYN)  IV Stopped (02/12/17 0606)     LOS: 3 days    Time spent: [place], MD [facility]   If 7PM-7AM, please contact night-coverage [email or web address] Password [facility] 02/12/2017, 12:55 PM

## 2017-02-13 LAB — GLUCOSE, CAPILLARY
GLUCOSE-CAPILLARY: 109 mg/dL — AB (ref 65–99)
Glucose-Capillary: 115 mg/dL — ABNORMAL HIGH (ref 65–99)
Glucose-Capillary: 157 mg/dL — ABNORMAL HIGH (ref 65–99)
Glucose-Capillary: 153 mg/dL — ABNORMAL HIGH (ref 65–99)

## 2017-02-13 LAB — CBC
HCT: 27.4 % — ABNORMAL LOW (ref 36.0–46.0)
HEMOGLOBIN: 8.7 g/dL — AB (ref 12.0–15.0)
MCH: 27.4 pg (ref 26.0–34.0)
MCHC: 31.8 g/dL (ref 30.0–36.0)
MCV: 86.2 fL (ref 78.0–100.0)
Platelets: 318 10*3/uL (ref 150–400)
RBC: 3.18 MIL/uL — ABNORMAL LOW (ref 3.87–5.11)
RDW: 14.8 % (ref 11.5–15.5)
WBC: 8 10*3/uL (ref 4.0–10.5)

## 2017-02-13 MED ORDER — GLUCERNA SHAKE PO LIQD: 237.0000 mL | Freq: Two times a day (BID) | ORAL | Status: DC

## 2017-02-13 MED ORDER — AMOXICILLIN-POT CLAVULANATE 500-125 MG PO TABS
1.0000 | ORAL_TABLET | ORAL | Status: DC
  Administered 2017-02-13 – 2017-02-15 (×3): 500 mg via ORAL
  Filled 2017-02-13 (×4): qty 1

## 2017-02-13 MED ORDER — AMOXICILLIN-POT CLAVULANATE 875-125 MG PO TABS: 1.0000 | ORAL_TABLET | Freq: Two times a day (BID) | ORAL | Status: DC

## 2017-02-13 NOTE — Progress Notes (Signed)
    CC:  Abdominal pain  Subjective: Good success with BM, pain is much better, she has lost her IV again and having trouble replacing this.  Tolerating clears well.  Worried about excess glucose in liquids.    Objective: Vital signs in last 24 hours: Temp:  [97.6 F (36.4 C)-97.9 F (36.6 C)] 97.6 F (36.4 C) (07/25 0616) Pulse Rate:  [56-64] 56 (07/25 0616) Resp:  [17-18] 17 (07/25 0616) BP: (172-174)/(72-73) 172/72 (07/25 0616) SpO2:  [95 %-98 %] 98 % (07/25 0616) Last BM Date:  (1 week ago per patient) 470 PO 1344 IV Urine 100 recorded BM x 1 Afebrile, VSS WBC down to 8.0   Intake/Output from previous day: 07/24 0701 - 07/25 0700 In: 1814.8 [P.O.:470; I.V.:1344.8] Out: 101 [Urine:100; Stool:1] Intake/Output this shift: No intake/output data recorded.  General appearance: alert, cooperative and no distress Resp: clear to auscultation bilaterally GI: soft, minimally tender and you really have to push to get some acknowledgement  Lab Results:   Recent Labs  02/12/17 0539 02/13/17 0513  WBC 12.1* 8.0  HGB 8.9* 8.7*  HCT 28.1* 27.4*  PLT 337 318    BMET  Recent Labs  02/11/17 0527 02/12/17 0539  NA 137 139  K 3.6 3.5  CL 103 105  CO2 20* 21*  GLUCOSE 128* 106*  BUN 80* 83*  CREATININE 4.53* 4.65*  CALCIUM 9.5 9.8   PT/INR No results for input(s): LABPROT, INR in the last 72 hours.   Recent Labs Lab 02/09/17 1246 02/10/17 0604  AST 22 30  ALT 16 23  ALKPHOS 64 73  BILITOT 0.7 0.8  PROT 6.5 6.6  ALBUMIN 3.3* 2.6*     Lipase     Component Value Date/Time   LIPASE 22 02/09/2017 1246     Medications: . carvedilol  3.125 mg Oral BID WC  . insulin aspart  0-15 Units Subcutaneous TID WC  . insulin aspart  0-5 Units Subcutaneous QHS  . insulin glargine  5 Units Subcutaneous QHS  . latanoprost  1 drop Both Eyes QPM  . levothyroxine  75 mcg Oral QAC breakfast  . timolol  1 drop Both Eyes BID   . sodium chloride 75 mL/hr at 02/13/17  0249  . piperacillin-tazobactam (ZOSYN)  IV 2.25 g (02/13/17 2035)    Assessment/Plan Diverticulitis Chronic kidney disease - Creatinine around 4 since June 2018 Echo 01/05/17: EF 59-74% grade 1 diastolic dysfunction - hx of chronic diastolic heart failure Bipolar disorder AODM Type II Chronic back pain Hypothyroid Body mass index is 33.8 Hypertension Glaucoma  Hx of anemia FEN: Clears/IV fluids ID: Zosyn 02/09/17 =>>day 5 DVT: SCD - she can be on anticoagulant from our standpoint  Plan:  WBC is better, she is doing better.  I will put her up to full liquids, I will defer to DR. Krishnan on abx.  Consider just switching her over to PO with IV issue.       LOS: 4 days    Zelig Gacek 02/13/2017 [phone]

## 2017-02-13 NOTE — Progress Notes (Signed)
[facility] PROGRESS NOTE  Alyssa [name] UMP:[id number] DOB: 1946-04-18 DOA: 02/09/2017  PCP: Haywood Pao, MD  Brief History/Interval Summary: 71 year old female with a past medical history of bipolar disorder, type 2 diabetes, chronic back pain, chronic kidney disease stage III, presented with abdominal pain. Was found to have diverticulitis with microperforation.  Reason for Visit: Acute diverticulitis  Consultants: Gen. surgery  Procedures: None  Antibiotics: Was on Zosyn. Changed over to Augmentin today.  Subjective/Interval History: Patient feels better. Abdominal pain is improved. Denies any nausea, vomiting.  ROS: No chest pain or shortness of breath  Objective:  Vital Signs  Vitals:   02/12/17 0635 02/12/17 2127 02/13/17 0616 02/13/17 1500  BP: (!) 143/59 (!) 174/73 (!) 172/72 (!) 152/82  Pulse: 65 64 (!) 56 62  Resp: 18 18 17 18   Temp: (!) 97.5 F (36.4 C) 97.9 F (36.6 C) 97.6 F (36.4 C) 97.9 F (36.6 C)  TempSrc: Oral Oral Oral Oral  SpO2: 98% 95% 98% 97%  Weight: 87.2 kg (192 lb 4.8 oz)     Height:        Intake/Output Summary (Last 24 hours) at 02/13/17 1520 Last data filed at 02/13/17 1330  Gross per 24 hour  Intake          1464.75 ml  Output              102 ml  Net          1362.75 ml   Filed Weights   02/09/17 1136 02/10/17 0519 02/12/17 0635  Weight: 79.4 kg (175 lb) 86.6 kg (190 lb 14.4 oz) 87.2 kg (192 lb 4.8 oz)    General appearance: alert, cooperative, appears stated age and no distress Resp: clear to auscultation bilaterally Cardio: regular rate and rhythm, S1, S2 normal, no murmur, click, rub or gallop GI: Abdomen is soft. Mildly tender in the left side without any rebound, rigidity or guarding. No masses or organomegaly. Bowel sounds are present. Extremities: extremities normal, atraumatic, no cyanosis or edema Neurologic: No focal deficits  Lab Results:  Data Reviewed: I have personally reviewed following  labs and imaging studies  CBC:  Recent Labs Lab 02/09/17 1246 02/10/17 0604 02/11/17 0527 02/12/17 0539 02/13/17 0513  WBC 8.3 12.5* 13.6* 12.1* 8.0  NEUTROABS 7.5  --  12.2* 10.6*  --   HGB 9.8* 8.6* 8.6* 8.9* 8.7*  HCT 29.9* 26.3* 26.2* 28.1* 27.4*  MCV 87.2 87.7 86.5 87.5 86.2  PLT 298 272 303 337 400    Basic Metabolic Panel:  Recent Labs Lab 02/09/17 1246 02/09/17 1531 02/10/17 0604 02/11/17 0527 02/12/17 0539  NA 131*  --  136 137 139  K 3.6  --  3.5 3.6 3.5  CL 96*  --  102 103 105  CO2 23  --  22 20* 21*  GLUCOSE 217*  --  179* 128* 106*  BUN 70*  --  67* 80* 83*  CREATININE 4.25* 4.28* 4.03* 4.53* 4.65*  CALCIUM 9.9  --  8.8* 9.5 9.8    GFR: Estimated Creatinine Clearance: 11.4 mL/min (A) (by C-G formula based on SCr of 4.65 mg/dL (H)).  Liver Function Tests:  Recent Labs Lab 02/09/17 1246 02/10/17 0604  AST 22 30  ALT 16 23  ALKPHOS 64 73  BILITOT 0.7 0.8  PROT 6.5 6.6  ALBUMIN 3.3* 2.6*     Recent Labs Lab 02/09/17 1246  LIPASE 22   CBG:  Recent Labs Lab 02/12/17 1138 02/12/17 1737  02/12/17 2124 02/13/17 0741 02/13/17 1325  GLUCAP 142* 123* 159* 115* 157*     Recent Results (from the past 240 hour(s))  Culture, blood (Routine X 2) w Reflex to ID Panel     Status: None (Preliminary result)   Collection Time: 02/09/17  3:40 PM  Result Value Ref Range Status   Specimen Description BLOOD RIGHT ANTECUBITAL  Final   Special Requests   Final    BOTTLES DRAWN AEROBIC AND ANAEROBIC Blood Culture adequate volume   Culture NO GROWTH 4 DAYS  Final   Report Status PENDING  Incomplete  Culture, blood (Routine X 2) w Reflex to ID Panel     Status: None (Preliminary result)   Collection Time: 02/09/17  6:35 PM  Result Value Ref Range Status   Specimen Description BLOOD RIGHT HAND  Final   Special Requests   Final    BOTTLES DRAWN AEROBIC AND ANAEROBIC Blood Culture results may not be optimal due to an inadequate volume of blood  received in culture bottles   Culture NO GROWTH 4 DAYS  Final   Report Status PENDING  Incomplete      Radiology Studies: No results found.   Medications:  Scheduled: . amoxicillin-clavulanate  1 tablet Oral Q24H  . carvedilol  3.125 mg Oral BID WC  . insulin aspart  0-15 Units Subcutaneous TID WC  . insulin aspart  0-5 Units Subcutaneous QHS  . insulin glargine  5 Units Subcutaneous QHS  . latanoprost  1 drop Both Eyes QPM  . levothyroxine  75 mcg Oral QAC breakfast  . timolol  1 drop Both Eyes BID   Continuous:  FFM:[id number], diphenhydrAMINE, morphine injection, ondansetron **OR** ondansetron (ZOFRAN) IV, sodium phosphate, traMADol  Assessment/Plan:  Principal Problem:   Diverticulitis Active Problems:   Hypertension   Bipolar 1 disorder (HCC)   Morbid obesity (BMI 39.21)    Depression, major, in partial remission (HCC)   CKD (chronic kidney disease) stage 3, GFR 30-59 ml/min   Hypothyroidism   T2_NIDDM   Anemia   Congestive heart failure (CHF) (HCC)    Acute diverticulitis with microperforation. Patient was hospitalized and started on IV antibiotics. Seen by general surgery. Patient is improving. White blood cell count is normal now. Patient lost her IV access this morning, and is hesitant to get a new one. Changed to Augmentin. Diet being advanced by general surgery.  Normocytic anemia Continue to monitor hemoglobin. No evidence for overt bleeding. Hemoglobin is stable.  Chronic kidney disease stage III Creatinine close to baseline. Patient was recently hospitalized in July for syncope thought to be due to aggressive diuresis. Her dose of Lasix was reduced at bedtime to 80 twice a day. Currently off of Lasix. Consider resuming at discharge.  Chronic diastolic CHF. Euvolemic, stable. Continue to monitor.  History of essential hypertension. Stable.  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 Holding her oral agents. Continue Lantus.  SSI.  Hypothyroidism. Continue with levothyroxine   DVT Prophylaxis: SCDs    Code Status: Full code  Family Communication: Discussed with patient and her daughter  Disposition Plan: Continue to mobilize. Anticipate discharge in 1-2 days.    LOS: 4 days   [place] [facility] Pager [phone] 02/13/2017, 3:20 PM  If 7PM-7AM, please contact night-coverage at [email or web address], password [facility]

## 2017-02-13 NOTE — Progress Notes (Signed)
[name] RN will follow up with the IV team if an IV is still needed or if the patient will be switched to PO medications. Alyssa [name]

## 2017-02-13 NOTE — Progress Notes (Signed)
Initial Nutrition Assessment  DOCUMENTATION CODES:   Obesity unspecified  INTERVENTION:   -RD will follow for diet advancement and adjust nutrition plan as appropriate -Glucerna Shake po BID, each supplement provides 220 kcal and 10 grams of protein  NUTRITION DIAGNOSIS:   Increased nutrient needs related to acute illness (diverticulitis) as evidenced by estimated needs.  GOAL:   Patient will meet greater than or equal to 90% of their needs  MONITOR:   PO intake, Supplement acceptance, Labs, Weight trends, Skin, I & O's, Diet advancement  REASON FOR ASSESSMENT:   Consult Assessment of nutrition requirement/status  ASSESSMENT:   Alyssa [name] is a 71 y.o. female with medical history significant for anemia, bipolar, diabetes, hypertension , hypothyroidism recent diagnosis of chronic kidney disease stage III recently hospitalized for syncope workup presents to the emergency department with the chief complaint abdominal pain nausea with one episode of emesis. Initial evaluation includes CT of the abdomen revealing diverticulitis with microperforation.  Pt admitted with diverticulitis with microperforation.   Pt out of room at time of visit. Unable to complete Nutrition-Focused physical exam at this time.   Reviewed wt hx; noted pt has experienced a 14.6% wt loss over the past 6 months, which is significant for time frame.   Case discussed with RN, who reports pt with multiple complaints. She confirmed pt concern over high sugar sources in clear liquid diet. Pt was advanced to a full liquid diet this morning. Reviewed meal orders today, which revealed selections of sugar-free items. This RD ordered meals for tomorrow as well, focusing on high protein, sugar-free items. RD will also add Glucerna shake for additional protein, due to limitations of full liquid diet.   Inpatient DM medications include: 5 units lantus at bedtime, HS correction scale, and meal corrections scale.    Labs reviewed: CBGS: 109-159. Last Hgb A1c: 7.4.  Diet Order:  Diet full liquid Room service appropriate? Yes; Fluid consistency: Thin  Skin:  Reviewed, no issues  Last BM:  02/12/17  Height:   Ht Readings from Last 1 Encounters:  02/09/17 5\' 3"  (1.6 m)    Weight:   Wt Readings from Last 1 Encounters:  02/12/17 192 lb 4.8 oz (87.2 kg)    Ideal Body Weight:  52.3 kg  BMI:  Body mass index is 34.06 kg/m.  Estimated Nutritional Needs:   Kcal:  1650-1850  Protein:  90-105 grams  Fluid:  > 1.6 L  EDUCATION NEEDS:   No education needs identified at this time  Kerin Kren A. Jimmye Norman, RD, LDN, CDE Pager: [phone] After hours Pager: [phone]

## 2017-02-14 LAB — CBC
HEMATOCRIT: 28.3 % — AB (ref 36.0–46.0)
HEMOGLOBIN: 9.4 g/dL — AB (ref 12.0–15.0)
MCH: 28.8 pg (ref 26.0–34.0)
MCHC: 33.2 g/dL (ref 30.0–36.0)
MCV: 86.8 fL (ref 78.0–100.0)
Platelets: 333 10*3/uL (ref 150–400)
RBC: 3.26 MIL/uL — ABNORMAL LOW (ref 3.87–5.11)
RDW: 15.3 % (ref 11.5–15.5)
WBC: 7.8 10*3/uL (ref 4.0–10.5)

## 2017-02-14 LAB — GLUCOSE, CAPILLARY
GLUCOSE-CAPILLARY: 129 mg/dL — AB (ref 65–99)
GLUCOSE-CAPILLARY: 188 mg/dL — AB (ref 65–99)
GLUCOSE-CAPILLARY: 189 mg/dL — AB (ref 65–99)
Glucose-Capillary: 123 mg/dL — ABNORMAL HIGH (ref 65–99)

## 2017-02-14 LAB — BASIC METABOLIC PANEL
ANION GAP: 10 (ref 5–15)
BUN: 71 mg/dL — ABNORMAL HIGH (ref 6–20)
CHLORIDE: 108 mmol/L (ref 101–111)
CO2: 20 mmol/L — AB (ref 22–32)
Calcium: 10 mg/dL (ref 8.9–10.3)
Creatinine, Ser: 4.1 mg/dL — ABNORMAL HIGH (ref 0.44–1.00)
GFR calc non Af Amer: 10 mL/min — ABNORMAL LOW (ref >60)
GFR, EST AFRICAN AMERICAN: 12 mL/min — AB (ref >60)
Glucose, Bld: 130 mg/dL — ABNORMAL HIGH (ref 65–99)
Potassium: 3.4 mmol/L — ABNORMAL LOW (ref 3.5–5.1)
Sodium: 138 mmol/L (ref 135–145)

## 2017-02-14 LAB — CULTURE, BLOOD (ROUTINE X 2)
CULTURE: NO GROWTH
CULTURE: NO GROWTH
Special Requests: ADEQUATE

## 2017-02-14 MED ORDER — ACETAMINOPHEN 325 MG PO TABS: 650.0000 mg | ORAL_TABLET | Freq: Four times a day (QID) | ORAL | Status: DC | PRN

## 2017-02-14 MED ORDER — PENTAFLUOROPROP-TETRAFLUOROETH EX AERO: INHALATION_SPRAY | Freq: Two times a day (BID) | CUTANEOUS | Status: DC | PRN

## 2017-02-14 MED ORDER — TRAMADOL HCL 50 MG PO TABS
50.0000 mg | ORAL_TABLET | Freq: Once | ORAL | Status: AC
  Administered 2017-02-14: 50 mg via ORAL

## 2017-02-14 NOTE — Progress Notes (Signed)
    CC:  Abdominal pain  Subjective: Complaining about the food.  Doesn't like soup, juice is to sweet.  Abdominal pain is better. He complains of some abdominal pain and pain with bowel movements. On exam her abdomen is soft and nontender and she currently she is having no discomfort.  Objective: Vital signs in last 24 hours: Temp:  [97.9 F (36.6 C)-98.4 F (36.9 C)] 98.4 F (36.9 C) (07/26 0551) Pulse Rate:  [62-69] 64 (07/26 0551) Resp:  [16-18] 16 (07/26 0551) BP: (152-155)/(73-82) 152/73 (07/26 0551) SpO2:  [97 %-98 %] 98 % (07/26 0551) Last BM Date: 02/12/17 440 PO recorded Void x 1 recorded BM x 3 recorded Afebrile, VSS K+ 3.4 Creatinine still base at 4.10 WBC is normal   Intake/Output from previous day: 07/25 0701 - 07/26 0700 In: 440 [P.O.:440] Out: 4 [Urine:1; Stool:3] Intake/Output this shift: No intake/output data recorded.  General appearance: alert, cooperative and no distress Resp: clear to auscultation bilaterally GI: Soft, nontender on exam this a.m., Positive bowel sounds. She reports loose stools. No history of diarrhea or constipation.  Lab Results:   Recent Labs  02/13/17 0513 02/14/17 0513  WBC 8.0 7.8  HGB 8.7* 9.4*  HCT 27.4* 28.3*  PLT 318 333    BMET  Recent Labs  02/12/17 0539 02/14/17 0513  NA 139 138  K 3.5 3.4*  CL 105 108  CO2 21* 20*  GLUCOSE 106* 130*  BUN 83* 71*  CREATININE 4.65* 4.10*  CALCIUM 9.8 10.0   PT/INR No results for input(s): LABPROT, INR in the last 72 hours.   Recent Labs Lab 02/09/17 1246 02/10/17 0604  AST 22 30  ALT 16 23  ALKPHOS 64 73  BILITOT 0.7 0.8  PROT 6.5 6.6  ALBUMIN 3.3* 2.6*     Lipase     Component Value Date/Time   LIPASE 22 02/09/2017 1246     Medications: . amoxicillin-clavulanate  1 tablet Oral Q24H  . carvedilol  3.125 mg Oral BID WC  . feeding supplement (GLUCERNA SHAKE)  237 mL Oral BID BM  . insulin aspart  0-15 Units Subcutaneous TID WC  . insulin  aspart  0-5 Units Subcutaneous QHS  . insulin glargine  5 Units Subcutaneous QHS  . latanoprost  1 drop Both Eyes QPM  . levothyroxine  75 mcg Oral QAC breakfast  . timolol  1 drop Both Eyes BID    Assessment/Plan Diverticulitis Chronic kidney disease - Creatinine around 4 since June 2018 Echo 01/05/17: EF 30-09% grade 1 diastolic dysfunction - hx of chronic diastolic heart failure Bipolar disorder AODM Type II Chronic back pain Hypothyroid Body mass index is 33.8 Hypertension Glaucoma  Hx of anemia FEN: full liquids/IV fluids ID: Zosyn 02/09/17 -02/12/17 lost IV =>> Augmentin 7/25 =>> day 2 DVT: SCD - she can be on anticoagulant from our standpoint     Plan: Carb modified diet,continue by mouth antibiotics.  If she continues to [facility] and go home soon on oral antibiotics.  DR. Kae Heller recommends a 10 day course of antibiotics. She can follow-up with her primary care and she'll also need to follow-up with Dr. Leighton Ruff, in 2-3 weeks.  LOS: 5 days    Thomson Herbers 02/14/2017 [phone]

## 2017-02-14 NOTE — Care Management Important Message (Signed)
Important Message  Patient Details  Name: Alyssa [name] MRN: [id number] Date of Birth: 1945/12/28   Medicare Important Message Given:  Yes    Orbie Pyo 02/14/2017, 11:52 AM

## 2017-02-14 NOTE — Progress Notes (Signed)
[facility] PROGRESS NOTE  Alyssa [name] IWL:[id number] DOB: [birth year] DOA: 02/09/2017  PCP: Haywood Pao, MD  Brief History/Interval Summary: 71 year old female with a past medical history of bipolar disorder, type 2 diabetes, chronic back pain, chronic kidney disease stage III, presented with abdominal pain. Was found to have diverticulitis with microperforation.  Reason for Visit: Acute diverticulitis  Consultants: Gen. surgery  Procedures: None  Antibiotics: Was on Zosyn. Changed over to Augmentin 7/25.  Subjective/Interval History: She feels better. Abdominal pain has improved. Denies any nausea, vomiting.  ROS: No chest pain or shortness of breath  Objective:  Vital Signs  Vitals:   02/13/17 0616 02/13/17 1500 02/13/17 2014 02/14/17 0551  BP: (!) 172/72 (!) 152/82 (!) 155/73 (!) 152/73  Pulse: (!) 56 62 69 64  Resp: 17 18 18 16   Temp: 97.6 F (36.4 C) 97.9 F (36.6 C) 98 F (36.7 C) 98.4 F (36.9 C)  TempSrc: Oral Oral Oral Oral  SpO2: 98% 97% 97% 98%  Weight:      Height:        Intake/Output Summary (Last 24 hours) at 02/14/17 0919 Last data filed at 02/14/17 0552  Gross per 24 hour  Intake              440 ml  Output                4 ml  Net              436 ml   Filed Weights   02/09/17 1136 02/10/17 0519 02/12/17 0635  Weight: 79.4 kg (175 lb) 86.6 kg (190 lb 14.4 oz) 87.2 kg (192 lb 4.8 oz)    General appearance: Awake, alert. In no distress Resp: Clear to auscultation bilaterally. No wheezing, rales or rhonchi Cardio: S1, S2 is normal, regular. No stressful. No rubs, murmurs, or bruit GI: Abdomen is soft. Mildly tender in the left lower quadrant without any rebound, rigidity or guarding. No masses or organomegaly Extremities: No edema Neurologic: No focal deficits  Lab Results:  Data Reviewed: I have personally reviewed following labs and imaging studies  CBC:  Recent Labs Lab 02/09/17 1246 02/10/17 0604  02/11/17 0527 02/12/17 0539 02/13/17 0513 02/14/17 0513  WBC 8.3 12.5* 13.6* 12.1* 8.0 7.8  NEUTROABS 7.5  --  12.2* 10.6*  --   --   HGB 9.8* 8.6* 8.6* 8.9* 8.7* 9.4*  HCT 29.9* 26.3* 26.2* 28.1* 27.4* 28.3*  MCV 87.2 87.7 86.5 87.5 86.2 86.8  PLT 298 272 303 337 318 174    Basic Metabolic Panel:  Recent Labs Lab 02/09/17 1246 02/09/17 1531 02/10/17 0604 02/11/17 0527 02/12/17 0539 02/14/17 0513  NA 131*  --  136 137 139 138  K 3.6  --  3.5 3.6 3.5 3.4*  CL 96*  --  102 103 105 108  CO2 23  --  22 20* 21* 20*  GLUCOSE 217*  --  179* 128* 106* 130*  BUN 70*  --  67* 80* 83* 71*  CREATININE 4.25* 4.28* 4.03* 4.53* 4.65* 4.10*  CALCIUM 9.9  --  8.8* 9.5 9.8 10.0    GFR: Estimated Creatinine Clearance: 13 mL/min (A) (by C-G formula based on SCr of 4.1 mg/dL (H)).  Liver Function Tests:  Recent Labs Lab 02/09/17 1246 02/10/17 0604  AST 22 30  ALT 16 23  ALKPHOS 64 73  BILITOT 0.7 0.8  PROT 6.5 6.6  ALBUMIN 3.3* 2.6*     Recent Labs  Lab 02/09/17 1246  LIPASE 22   CBG:  Recent Labs Lab 02/13/17 0741 02/13/17 1325 02/13/17 1657 02/13/17 2153 02/14/17 0759  GLUCAP 115* 157* 109* 153* 123*     Recent Results (from the past 240 hour(s))  Culture, blood (Routine X 2) w Reflex to ID Panel     Status: None (Preliminary result)   Collection Time: 02/09/17  3:40 PM  Result Value Ref Range Status   Specimen Description BLOOD RIGHT ANTECUBITAL  Final   Special Requests   Final    BOTTLES DRAWN AEROBIC AND ANAEROBIC Blood Culture adequate volume   Culture NO GROWTH 4 DAYS  Final   Report Status PENDING  Incomplete  Culture, blood (Routine X 2) w Reflex to ID Panel     Status: None (Preliminary result)   Collection Time: 02/09/17  6:35 PM  Result Value Ref Range Status   Specimen Description BLOOD RIGHT HAND  Final   Special Requests   Final    BOTTLES DRAWN AEROBIC AND ANAEROBIC Blood Culture results may not be optimal due to an inadequate volume of  blood received in culture bottles   Culture NO GROWTH 4 DAYS  Final   Report Status PENDING  Incomplete      Radiology Studies: No results found.   Medications:  Scheduled: . amoxicillin-clavulanate  1 tablet Oral Q24H  . carvedilol  3.125 mg Oral BID WC  . feeding supplement (GLUCERNA SHAKE)  237 mL Oral BID BM  . insulin aspart  0-15 Units Subcutaneous TID WC  . insulin aspart  0-5 Units Subcutaneous QHS  . insulin glargine  5 Units Subcutaneous QHS  . latanoprost  1 drop Both Eyes QPM  . levothyroxine  75 mcg Oral QAC breakfast  . timolol  1 drop Both Eyes BID   Continuous:  RKY:[id number], diphenhydrAMINE, morphine injection, ondansetron **OR** ondansetron (ZOFRAN) IV, sodium phosphate, traMADol  Assessment/Plan:  Principal Problem:   Diverticulitis Active Problems:   Hypertension   Bipolar 1 disorder (HCC)   Morbid obesity (BMI 39.21)    Depression, major, in partial remission (HCC)   CKD (chronic kidney disease) stage 3, GFR 30-59 ml/min   Hypothyroidism   T2_NIDDM   Anemia   Congestive heart failure (CHF) (HCC)    Acute diverticulitis with microperforation. Patient was hospitalized and started on IV antibiotics. Seen by general surgery. Patient is improving. White blood cell count is normal. She was changed over to Augmentin. She seems to be doing well. Diet being advanced. Discussed with general surgery. Anticipate discharge tomorrow.   Normocytic anemia Continue to monitor hemoglobin. No evidence for overt bleeding. Hemoglobin is stable.  Chronic kidney disease stage III Creatinine close to baseline. Patient was recently hospitalized in July for syncope thought to be due to aggressive diuresis. Her dose of Lasix was reduced at bedtime to 80 twice a day. Currently off of Lasix. She will resume at discharge.  Chronic diastolic CHF. Euvolemic, stable. Continue to monitor.  History of essential hypertension. Stable.  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 Holding her oral agents. Continue Lantus. SSI.  Hypothyroidism. Continue with levothyroxine   DVT Prophylaxis: SCDs    Code Status: Full code  Family Communication: Discussed with patient and her daughter  Disposition Plan: Continue to mobilize.    LOS: 5 days   [facility] Pager [phone] 02/14/2017, 9:19 AM  If 7PM-7AM, please contact night-coverage at [email or web address], password [facility]

## 2017-02-15 ENCOUNTER — Other Ambulatory Visit: Payer: Self-pay

## 2017-02-15 ENCOUNTER — Inpatient Hospital Stay (HOSPITAL_COMMUNITY): Payer: Medicare Other

## 2017-02-15 DIAGNOSIS — I214 Non-ST elevation (NSTEMI) myocardial infarction: Secondary | ICD-10-CM

## 2017-02-15 DIAGNOSIS — F319 Bipolar disorder, unspecified: Secondary | ICD-10-CM

## 2017-02-15 DIAGNOSIS — I259 Chronic ischemic heart disease, unspecified: Secondary | ICD-10-CM

## 2017-02-15 DIAGNOSIS — R072 Precordial pain: Secondary | ICD-10-CM

## 2017-02-15 LAB — GLUCOSE, CAPILLARY
Glucose-Capillary: 179 mg/dL — ABNORMAL HIGH (ref 65–99)
Glucose-Capillary: 167 mg/dL — ABNORMAL HIGH (ref 65–99)
Glucose-Capillary: 129 mg/dL — ABNORMAL HIGH (ref 65–99)
Glucose-Capillary: 128 mg/dL — ABNORMAL HIGH (ref 65–99)

## 2017-02-15 LAB — BASIC METABOLIC PANEL
ANION GAP: 11 (ref 5–15)
BUN: 66 mg/dL — AB (ref 6–20)
CO2: 20 mmol/L — AB (ref 22–32)
CREATININE: 3.79 mg/dL — AB (ref 0.44–1.00)
Calcium: 10.2 mg/dL (ref 8.9–10.3)
Chloride: 107 mmol/L (ref 101–111)
GFR calc non Af Amer: 11 mL/min — ABNORMAL LOW (ref >60)
GFR, EST AFRICAN AMERICAN: 13 mL/min — AB (ref >60)
GLUCOSE: 168 mg/dL — AB (ref 65–99)
Potassium: 3.3 mmol/L — ABNORMAL LOW (ref 3.5–5.1)
SODIUM: 138 mmol/L (ref 135–145)

## 2017-02-15 LAB — HEPATIC FUNCTION PANEL
ALBUMIN: 2.6 g/dL — AB (ref 3.5–5.0)
ALK PHOS: 239 U/L — AB (ref 38–126)
ALT: 33 U/L (ref 14–54)
AST: 24 U/L (ref 15–41)
BILIRUBIN TOTAL: 0.9 mg/dL (ref 0.3–1.2)
Bilirubin, Direct: 0.2 mg/dL (ref 0.1–0.5)
Indirect Bilirubin: 0.7 mg/dL (ref 0.3–0.9)
Total Protein: 5.9 g/dL — ABNORMAL LOW (ref 6.5–8.1)

## 2017-02-15 LAB — HEPARIN LEVEL (UNFRACTIONATED)
HEPARIN UNFRACTIONATED: 1.6 [IU]/mL — AB (ref 0.30–0.70)
Heparin Unfractionated: 0.1 IU/mL — ABNORMAL LOW (ref 0.30–0.70)

## 2017-02-15 LAB — CBC
HCT: 30.1 % — ABNORMAL LOW (ref 36.0–46.0)
HEMOGLOBIN: 9.8 g/dL — AB (ref 12.0–15.0)
MCH: 27.7 pg (ref 26.0–34.0)
MCHC: 32.6 g/dL (ref 30.0–36.0)
MCV: 85 fL (ref 78.0–100.0)
PLATELETS: 380 10*3/uL (ref 150–400)
RBC: 3.54 MIL/uL — ABNORMAL LOW (ref 3.87–5.11)
RDW: 14.8 % (ref 11.5–15.5)
WBC: 12.3 10*3/uL — ABNORMAL HIGH (ref 4.0–10.5)

## 2017-02-15 LAB — MRSA PCR SCREENING: MRSA by PCR: NEGATIVE

## 2017-02-15 LAB — TROPONIN I
Troponin I: 0.08 ng/mL (ref <0.03)
Troponin I: 0.06 ng/mL (ref <0.03)
Troponin I: 0.04 ng/mL (ref <0.03)

## 2017-02-15 LAB — ECHOCARDIOGRAM COMPLETE
Height: 63 in
Weight: 3100.55 oz

## 2017-02-15 MED ORDER — HYDRALAZINE HCL 25 MG PO TABS
25.0000 mg | ORAL_TABLET | Freq: Three times a day (TID) | ORAL | Status: DC
  Administered 2017-02-15 – 2017-02-18 (×8): 25 mg via ORAL
  Filled 2017-02-15 (×9): qty 1

## 2017-02-15 MED ORDER — FUROSEMIDE 10 MG/ML IJ SOLN: 60.0000 mg | Freq: Once | INTRAMUSCULAR | Status: DC

## 2017-02-15 MED ORDER — ISOSORB DINITRATE-HYDRALAZINE 20-37.5 MG PO TABS
1.0000 | ORAL_TABLET | Freq: Three times a day (TID) | ORAL | Status: DC
  Administered 2017-02-15 (×2): 1 via ORAL
  Filled 2017-02-15 (×3): qty 1

## 2017-02-15 MED ORDER — MORPHINE SULFATE (PF) 4 MG/ML IV SOLN
2.0000 mg | Freq: Once | INTRAVENOUS | Status: AC
  Administered 2017-02-15: 2 mg via INTRAVENOUS
  Filled 2017-02-15: qty 1

## 2017-02-15 MED ORDER — PANTOPRAZOLE SODIUM 40 MG PO TBEC
40.0000 mg | DELAYED_RELEASE_TABLET | Freq: Every day | ORAL | Status: DC
  Administered 2017-02-15 – 2017-02-22 (×8): 40 mg via ORAL
  Filled 2017-02-15 (×8): qty 1

## 2017-02-15 MED ORDER — IOPAMIDOL (ISOVUE-300) INJECTION 61%
15.0000 mL | INTRAVENOUS | Status: AC
  Administered 2017-02-15 (×2): 15 mL via ORAL

## 2017-02-15 MED ORDER — CARVEDILOL 6.25 MG PO TABS
6.2500 mg | ORAL_TABLET | Freq: Two times a day (BID) | ORAL | Status: DC
  Administered 2017-02-15 – 2017-02-22 (×12): 6.25 mg via ORAL
  Filled 2017-02-15: qty 2
  Filled 2017-02-15 (×5): qty 1
  Filled 2017-02-15: qty 2
  Filled 2017-02-15 (×2): qty 1
  Filled 2017-02-15: qty 2
  Filled 2017-02-15: qty 1
  Filled 2017-02-15: qty 2
  Filled 2017-02-15: qty 1

## 2017-02-15 MED ORDER — NITROGLYCERIN IN D5W 200-5 MCG/ML-% IV SOLN
3.0000 ug/min | INTRAVENOUS | Status: DC
  Administered 2017-02-15: 3 ug/min via INTRAVENOUS
  Filled 2017-02-15 (×2): qty 250

## 2017-02-15 MED ORDER — FUROSEMIDE 80 MG PO TABS
80.0000 mg | ORAL_TABLET | Freq: Two times a day (BID) | ORAL | Status: DC
  Administered 2017-02-15 – 2017-02-20 (×10): 80 mg via ORAL
  Filled 2017-02-15 (×11): qty 1

## 2017-02-15 MED ORDER — HEPARIN (PORCINE) IN NACL 100-0.45 UNIT/ML-% IJ SOLN
850.0000 [IU]/h | INTRAMUSCULAR | Status: DC
  Filled 2017-02-15: qty 250

## 2017-02-15 MED ORDER — HEPARIN BOLUS VIA INFUSION
4000.0000 [IU] | Freq: Once | INTRAVENOUS | Status: AC
  Administered 2017-02-15: 4000 [IU] via INTRAVENOUS
  Filled 2017-02-15: qty 4000

## 2017-02-15 MED ORDER — HEPARIN BOLUS VIA INFUSION
2000.0000 [IU] | Freq: Once | INTRAVENOUS | Status: AC
  Administered 2017-02-16: 2000 [IU] via INTRAVENOUS
  Filled 2017-02-15: qty 2000

## 2017-02-15 MED ORDER — POTASSIUM CHLORIDE CRYS ER 20 MEQ PO TBCR
20.0000 meq | EXTENDED_RELEASE_TABLET | Freq: Once | ORAL | Status: AC
  Administered 2017-02-15: 20 meq via ORAL
  Filled 2017-02-15: qty 1

## 2017-02-15 MED ORDER — HEPARIN BOLUS VIA INFUSION
4000.0000 [IU] | Freq: Once | INTRAVENOUS | Status: DC
  Filled 2017-02-15: qty 4000

## 2017-02-15 MED ORDER — ASPIRIN 325 MG PO TABS
325.0000 mg | ORAL_TABLET | Freq: Every day | ORAL | Status: DC
  Administered 2017-02-15 – 2017-02-22 (×7): 325 mg via ORAL
  Filled 2017-02-15 (×8): qty 1

## 2017-02-15 MED ORDER — HEPARIN (PORCINE) IN NACL 100-0.45 UNIT/ML-% IJ SOLN
1350.0000 [IU]/h | INTRAMUSCULAR | Status: DC
  Administered 2017-02-15 (×2): 850 [IU]/h via INTRAVENOUS
  Administered 2017-02-17 (×2): 1350 [IU]/h via INTRAVENOUS
  Filled 2017-02-15 (×7): qty 250

## 2017-02-15 MED ORDER — PIPERACILLIN-TAZOBACTAM IN DEX 2-0.25 GM/50ML IV SOLN
2.2500 g | Freq: Three times a day (TID) | INTRAVENOUS | Status: DC
  Administered 2017-02-15 – 2017-02-18 (×10): 2.25 g via INTRAVENOUS
  Filled 2017-02-15 (×12): qty 50

## 2017-02-15 NOTE — Progress Notes (Signed)
Brief Nutrition Follow-Up Note  RD consulted for diet education.   Chart reviewed. Cardiology consulted for chest pain; undergoing work-up to rule out MI.   Per MD notes, CXR raises concern for free air in abdomen. Repeat CT is pending and pt may require laparotomy, colon resection, and colostomy.   Pt currently NPO. Pt en route to transfer to ICU. Will complete education when more appropriate.   RD will continue to follow for acute nutrition issues.  Caspian Deleonardis A. Jimmye Norman, RD, LDN, CDE Pager: [phone] After hours Pager: [phone]

## 2017-02-15 NOTE — Care Management Note (Signed)
Case Management Note  Patient Details  Name: Alyssa [name] MRN: [id number] Date of Birth: 1946-05-18  Subjective/Objective:  Pt admitted on 02/09/17 with diverticulitis with microperforation.  PTA, pt independent and lives with family.                  Action/Plan: Pt transferred to 4N today; will follow for discharge planning as pt progresses.    Expected Discharge Date:                  Expected Discharge Plan:     In-House Referral:     Discharge planning Services  CM Consult  Post Acute Care Choice:    Choice offered to:     DME Arranged:    DME Agency:     HH Arranged:    HH Agency:     Status of Service:  In process, will continue to follow  If discussed at Long Length of Stay Meetings, dates discussed:    Additional Comments:  Reinaldo Raddle, RN, BSN  Trauma/Neuro ICU Case Manager [phone]

## 2017-02-15 NOTE — Progress Notes (Addendum)
Addendum: Cardiology now cancelled Heparin consult until discuss with surgery. Will cancel orders and follow.  Sloan Leiter, PharmD, BCPS Clinical Pharmacist 02/15/2017, 12:53 PM ______  ANTICOAGULATION CONSULT NOTE - Initial Consult  Pharmacy Consult for Heparin Indication: chest pain/ACS  Allergies  Allergen Reactions  . Ciprofloxacin Hcl Diarrhea  . Cymbalta [Duloxetine Hcl] Other (See Comments)    "Ineffective"  . Lipitor [Atorvastatin] Other (See Comments)    Makes the patient "not feel right"  . Xanax [Alprazolam] Other (See Comments)    Excessive sleepiness  . Codeine     Reaction not recalled  . Lortab [Hydrocodone-Acetaminophen] Other (See Comments)    Reaction not recalled  . Oxycodone Other (See Comments)    Reaction not recalled     Patient Measurements: Height: 5\' 3"  (160 cm) Weight: 193 lb 12.6 oz (87.9 kg) IBW/kg (Calculated) : 52.4 Heparin Dosing Weight: 72 kg  Vital Signs: Temp: 98.1 F (36.7 C) (07/27 0445) Temp Source: Oral (07/27 0445) BP: 185/97 (07/27 0445) Pulse Rate: 81 (07/27 0445)  Labs:  Recent Labs  02/13/17 0513 02/14/17 0513 02/15/17 0529  HGB 8.7* 9.4* 9.8*  HCT 27.4* 28.3* 30.1*  PLT 318 333 380  CREATININE  --  4.10* 3.79*  TROPONINI  --   --  0.08*   Estimated Creatinine Clearance: 14.1 mL/min (A) (by C-G formula based on SCr of 3.79 mg/dL (H)).  Medical History: Past Medical History:  Diagnosis Date  . Anemia   . Bipolar 1 disorder ([place])   . Cataract   . Chronic back pain   . Diabetes mellitus without complication ([place])   . Glaucoma   . Hyperlipidemia   . Hypertension   . Hypothyroidism   . Osteoporosis     Assessment: 71 year old female admitted 02/09/17 with abdominal pain and diagnosed with diverticulitis with micro-perforation. She was treated medically and was improving. However, now with new onset chest and should pain and inferior ST changes on EKG to start IV Heparin per pharmacy consult for ACS.    Hemoglobin is low-stable. Platelets are within normal limits.  No bleeding has been reported.  Troponin mildly elevated at 0.08.  SCDs only for DVT prophylaxis.  Patient was not on anticoagulants prior to admission. She does have chronic kidney disease (stage 4). SCr is 3.79 which is trending down (peak this admission 4.65) but appears baseline.   Goal of Therapy:  Heparin level 0.3-0.7 units/ml Monitor platelets by anticoagulation protocol: Yes   Plan:  Initiate Heparin bolus of 4000 units x1. Initiate Heparin drip at 850 units/hr. Heparin level in 8 hours Daily Heparin level and CBC while on therapy.  Sloan Leiter, PharmD, BCPS Clinical Pharmacist Clinical Phone 02/15/2017 until 3:30 PM- [phone] After hours, please call [phone] 02/15/2017,12:38 PM

## 2017-02-15 NOTE — Consult Note (Signed)
Cardiology Consultation:   Patient ID: Alyssa [name]; [id number]; May 13, 1946   Admit date: 02/09/2017 Date of Consult: 02/15/2017  Primary Care Provider: Haywood Pao, MD Primary Cardiologist: Dr Claiborne Billings   Patient Profile:   Alyssa [name] is a 71 y.o. female with a hx of diverticulitis, diastolic CHF, and CRI who is being seen today for the evaluation of chest pain at the request of Dr Maryland Pink.  History of Present Illness:   Alyssa [name] is a69 y/o female seen by [name] in 2015 after Alyssa [name] presented with CHF and a Rt pleural effusion. Alyssa [name] was admitted 10/12/13 with acute respiratory failure requiring intubation. This was presumed to be from acute diastolic CHF. Echo then showed an EF of 60-65% with grade 1 diastolic dysfunction and a PA pressure of 56 mmHg. Alyssa [name] was diuresed and had a Rt pleural effusion that was tapped- 1100cc.  Myoview then was low risk. Dr Claiborne Billings felt Alyssa [name] probably had sleep apnea as well. We saw her once in follow up but then no further contact until June this year.  Alyssa [name] was admitted 01/04/17 with CHF and acute on chronic kidney insuficiency. Her wgt on admission was 222 lbs. An echo done 01/05/17 showed her EF to be 50-55% with severe LVH. Alyssa [name] was diuresed and her SCr was followed closely. Her Rt pleural effusion persisted and this was tapped on 01/10/17 (975ml).   Alyssa [name] was seen in the office 01/24/17 and was stable- no recurrent effusion on exam. Alyssa [name] was encouraged to f/u with her nephrologist.  Alyssa [name] then was admitted 02/01/17-02/02/17 after what was felt to be orthostatic syncope. Her diuretics were adjusted.  Alyssa [name] then presented to the ED 02/09/17 with abdominal pain and has been diagnosed with diverticulitis with micro perforation. Alyssa [name] has been treated medically and has been improving but last night Alyssa [name] developed Lt upper chest and Lt shoulder pain associated with SOB. Her EKG this morning shows new marked inferior lateral ST changes. Alyssa [name] still has pain with some pleuritic  component. Initial Troponin 0.08.    Past Medical History:  Diagnosis Date  . Anemia   . Bipolar 1 disorder ([place])   . Cataract   . Chronic back pain   . Diabetes mellitus without complication ([place])   . Glaucoma   . Hyperlipidemia   . Hypertension   . Hypothyroidism   . Osteoporosis     Past Surgical History:  Procedure Laterality Date  . BACK SURGERY  2007  . BREAST SURGERY Left    CYST REMOVAL  . DILATION AND CURETTAGE OF UTERUS    . EYE SURGERY Left July 2015   Lt CE/ IOL implant  . EYE SURGERY Right July 2016   Rt CE/IOL  . IR THORACENTESIS ASP PLEURAL SPACE W/IMG GUIDE  01/10/2017  . KNEE ARTHROSCOPY Bilateral   . LUMBAR FUSION  2009     Inpatient Medications: Scheduled Meds: . amoxicillin-clavulanate  1 tablet Oral Q24H  . carvedilol  3.125 mg Oral BID WC  . feeding supplement (GLUCERNA SHAKE)  237 mL Oral BID BM  . furosemide  80 mg Oral BID  . insulin aspart  0-15 Units Subcutaneous TID WC  . insulin aspart  0-5 Units Subcutaneous QHS  . insulin glargine  5 Units Subcutaneous QHS  . iopamidol  15 mL Oral Q1 Hr x 2  . isosorbide-hydrALAZINE  1 tablet Oral TID  . latanoprost  1 drop Both Eyes QPM  . levothyroxine  75 mcg Oral QAC breakfast  . pantoprazole  40 mg Oral Q1200  . timolol  1 drop Both Eyes BID   Continuous Infusions:  PRN Meds: acetaminophen, diphenhydrAMINE, morphine injection, ondansetron **OR** ondansetron (ZOFRAN) IV, pentafluoroprop-tetrafluoroeth, sodium phosphate, traMADol  Allergies:    Allergies  Allergen Reactions  . Ciprofloxacin Hcl Diarrhea  . Cymbalta [Duloxetine Hcl] Other (See Comments)    "Ineffective"  . Lipitor [Atorvastatin] Other (See Comments)    Makes the patient "not feel right"  . Xanax [Alprazolam] Other (See Comments)    Excessive sleepiness  . Codeine     Reaction not recalled  . Lortab [Hydrocodone-Acetaminophen] Other (See Comments)    Reaction not recalled  . Oxycodone Other (See Comments)    Reaction  not recalled     Social History:   Social History   Social History  . Marital status: Married    Spouse name: N/A  . Number of children: N/A  . Years of education: N/A   Occupational History  .  Retired   Social History Main Topics  . Smoking status: Never Smoker  . Smokeless tobacco: Never Used  . Alcohol use Yes     Comment: social drinker- glass of wine or cocktail  . Drug use: No  . Sexual activity: No   Other Topics Concern  . Not on file   Social History Narrative  . No narrative on file    Family History:   The patient's family history is not on file. Alyssa [name] was adopted.  ROS:  Please see the history of present illness.  ROS  All other ROS reviewed and negative.     Physical Exam/Data:   Vitals:   02/14/17 1431 02/14/17 2132 02/15/17 0445 02/15/17 0500  BP: (!) 155/60 (!) 179/82 (!) 185/97   Pulse: 68 67 81   Resp: 18 19 18    Temp: 97.7 F (36.5 C) 98.4 F (36.9 C) 98.1 F (36.7 C)   TempSrc: Oral Oral Oral   SpO2: 95% 97%    Weight:    193 lb 12.6 oz (87.9 kg)  Height:        Intake/Output Summary (Last 24 hours) at 02/15/17 1202 Last data filed at 02/15/17 1001  Gross per 24 hour  Intake              480 ml  Output              200 ml  Net              280 ml   Filed Weights   02/10/17 0519 02/12/17 0635 02/15/17 0500  Weight: 190 lb 14.4 oz (86.6 kg) 192 lb 4.8 oz (87.2 kg) 193 lb 12.6 oz (87.9 kg)   Body mass index is 34.33 kg/m.  General:  Well nourished, well developed, in no acute distress, pale HEENT: normal, glasses Lymph: no adenopathy Neck: no JVD Endocrine:  No thryomegaly Vascular: No carotid bruits; FA pulses 2+ bilaterally without bruits  Cardiac:  normal S1, S2; RRR; no murmur or rub Lungs:  clear to auscultation bilaterally, no wheezing, rhonchi or rales, decreased at bases Abd: soft,  Not distended  Ext: trace edema Musculoskeletal:  No deformities, BUE and BLE strength normal and equal Skin: warm, pale, and dry    Neuro:  CNs 2-12 intact, no focal abnormalities noted Psych:  Normal affect   EKG:  The EKG was personally reviewed and demonstrates:  NSR with marked inferior and lateral TWI. QTc 522  Telemetry:  Telemetry was personally reviewed and demonstrates:  NSR  Relevant CV Studies: Echo January 05 2017- Study Conclusions  - Left ventricle: The cavity size was normal. Wall thickness was   increased in a pattern of severe LVH. Systolic function was   normal. The estimated ejection fraction was in the range of 50%   to 55%. Wall motion was normal; there were no regional wall   motion abnormalities. Doppler parameters are consistent with   abnormal left ventricular relaxation (grade 1 diastolic   dysfunction). Doppler parameters are consistent with high   ventricular filling pressure. - Aortic valve: Mildly calcified annulus. Trileaflet; normal   thickness leaflets. Valve area (VTI): 3.61 cm^2. Valve area   (Vmax): 3.31 cm^2. Valve area (Vmean): 3.39 cm^2. - Mitral valve: Mildly calcified annulus. Normal thickness leaflets   . Valve area by continuity equation (using LVOT flow): 2.66 cm^2. - Left atrium: The atrium was mildly to moderately dilated. - Right atrium: The atrium was mildly dilated. - Atrial septum: No defect or patent foramen ovale was identified. - Pericardium, extracardiac: There is a large left pleural   effusion. There is a trivial circumferential pericardial   effusion. There is no evidence of tamponade physiology.   Laboratory Data:  Chemistry Recent Labs Lab 02/12/17 0539 02/14/17 0513 02/15/17 0529  NA 139 138 138  K 3.5 3.4* 3.3*  CL 105 108 107  CO2 21* 20* 20*  GLUCOSE 106* 130* 168*  BUN 83* 71* 66*  CREATININE 4.65* 4.10* 3.79*  CALCIUM 9.8 10.0 10.2  GFRNONAA 9* 10* 11*  GFRAA 10* 12* 13*  ANIONGAP 13 10 11      Recent Labs Lab 02/09/17 1246 02/10/17 0604 02/15/17 0529  PROT 6.5 6.6 5.9*  ALBUMIN 3.3* 2.6* 2.6*  AST 22 30 24   ALT 16 23 33   ALKPHOS 64 73 239*  BILITOT 0.7 0.8 0.9   Hematology Recent Labs Lab 02/13/17 0513 02/14/17 0513 02/15/17 0529  WBC 8.0 7.8 12.3*  RBC 3.18* 3.26* 3.54*  HGB 8.7* 9.4* 9.8*  HCT 27.4* 28.3* 30.1*  MCV 86.2 86.8 85.0  MCH 27.4 28.8 27.7  MCHC 31.8 33.2 32.6  RDW 14.8 15.3 14.8  PLT 318 333 380   Cardiac Enzymes Recent Labs Lab 02/15/17 0529  TROPONINI 0.08*   No results for input(s): TROPIPOC in the last 168 hours.  BNPNo results for input(s): BNP, PROBNP in the last 168 hours.  DDimer No results for input(s): DDIMER in the last 168 hours.  Radiology/Studies:  Dg Chest 2 View  Result Date: 02/15/2017 CLINICAL DATA:  Shortness of breath.  Chest pain . EXAM: CHEST  2 VIEW COMPARISON:  02/09/2017 . FINDINGS: Cardiomegaly. No evidence of overt pulmonary edema. Mild basilar Basilar atelectasis. Small left pleural effusion cannot be excluded. Free air under the left hemidiaphragm versus gastric distention cannot be excluded. Abdominal series suggested for further evaluation . IMPRESSION: 1. Free air under the left hemidiaphragm versus gastric distention cannot be excluded. Abdominal series suggest for further evaluation. 2. Cardiomegaly. No evidence of overt congestive heart failure . Mild left base atelectasis. Small left pleural effusion. Critical Value/emergent results were called by telephone at the time of interpretation on 02/15/2017 at 8:54 am to nurse Arbie Cookey, who verbally acknowledged these results. Electronically Signed   By: Marcello Moores  Register   On: 02/15/2017 08:57    Assessment and Plan:   Chest pain with new EKG changes- R/O MI. Consider possibility of a PE as well as I don't see Alyssa [name] was on Lovenox or Heparin. Difficult situation with  stage 4 CRI- high risk for renal failure with coronary angiogram or CTA of her chest.   Diverticulitis- Hospitalized since 7/21- this appeared to be improving on conservative Rx  H/O Rt pleural effusion - 2015 and June 2018. This was felt  to be secondary to diastolic CHF.   HCVD-  Normal LVF with severe LVH on her last echo  Stage 4 CRI-  Followed by Dr Joelyn Oms at [facility].   IDDM- Per primary service  Plan:  Will review with MD- add Nitrate-  ? Anticoagulate-will need to discuss with surgery. Cycle Troponin, consider D-dimer- would be helpful if normal.    Signed, Kerin Ransom, PA-C  02/15/2017 12:02 PM   Patient seen and examined. Agree with assessment and plan. Alyssa [name] is a 71 year old Caucasian female who has a history of diastolic CHF.  In 2015 .  Alyssa [name] was amended with acute respiratory failure requiring intubation.  An echo Doppler study revealed an EF of 60-65% with grade 1 diastolic dysfunction with moderate pulmonary artery hypertension with a PA systolic pressure 56 mm.  Alyssa [name] had a right pleural effusion which was tapped and Alyssa [name] underwent diuresis.  A nuclear cardiac Myoview study was low risk.  Alyssa [name] was readmitted in June 2018 with CHF in the setting of acute on chronic kidney insufficiency.  An echo on 01/05/2017 showed an EF of 50-55% with severe left ventricular hypertrophy.  Alyssa [name] had persistent right pleural effusion which again required thoracentesis.  Alyssa [name] was seen by Kerin Ransom on 01/24/2017 and was not felt to have recurrent effusion on exam.  Alyssa [name] has chronic kidney disease with recent creatinines in the mid 3 range.  Alyssa [name] presented to [facility] hospital with abdominal pain on 02/09/2017 has been diagnosed with diverticulitis with possible microperforation.  Alyssa [name] has been treated medically with improvement.  Last evening while sleeping.  Alyssa [name] experience left upper chest discomfort which he said was associated with shortness of breath.  Alyssa [name] was given a heating pad.  An ECG disc morning showed new marked anterolateral T wave inversion.  Highly suggestive of potential LAD ischemia.  Alyssa [name]'s had some mild residual chest sensation with some pleuritic component.  Her initial troponin was 0.08.  Alyssa [name] was significant  hypertensive earlier today while sleeping with pain.  Recent blood pressure was improved at 140/70.  Her pulse was in the 60s to 70s.  HEENT is unremarkable.  Alyssa [name] has grade 3 Mallampati scale.  Alyssa [name] did not have wheezing on exam.  Alyssa [name] did not have chest wall tenderness.  Rhythm is regular with 1/6 systolic murmur.  Alyssa [name] had central adiposity.  Abdomen appears nontender.  There is trace lower extremity edema.  Neurologic exam is grossly nonfocal.  A chest x-ray today was concerning for possible gastric distention versus small amount of free air under the left hemidiaphragm.  Her ECG this morning is clearly change from her ECG of 02/01/2017 and shows marked T wave inversion anterolaterally.  A follow-up ECG was obtained after my evaluation, which essentially was unchanged from the one done earlier this morning.  I'm concerned that Alyssa [name] may have LAD ischemia contributing to these marked L ischemic changes.  I will start her on IV nitroglycerin and Alyssa [name] will be transferred from 6 N. heart unit.  Ideally, it would be favorable to initiate low-dose systemic heparin, but hesitant to do this with her history suggesting possible microperforation from diverticulitis as well as the small amount of free air under her left hemidiaphragm.  Alyssa [name] is scheduled to  undergo abdominal CT scan for further evaluation.  We'll obtain serial troponins.  Will increase carvedilol to 6.25 mg twice a day for more optimal beta-blockade.  Alyssa [name] has early stage V chronic kidney disease with her creatinine today at 3.79, an estimated GFR at 11.  At present would not favor emergent cardiac catheterization due to her high risk for contrast mediated nephropathy and need for dialysis unless Alyssa [name] develops increasing symptomatology or change in electrocardiographic findings.   Troy Sine, MD, [facility] 02/15/2017 1:54 PM

## 2017-02-15 NOTE — Progress Notes (Signed)
CT results and images reviewed- pneumoperitoneum, increased gas and fluid collections in pelvis- essentially worsening evolution of perforated diverticulitis. I discussed the findings with Ms. Magoon and her daughter who was as the bedside. The patient continues to complain of chest pain although less severe, now on heparin drip, and her abdominal pain is mild as it was earlier today and previous days.  I advised them that given the worsening diverticulitis, despite nearly a week of antibiotic therapy, I recommend surgery to include laparotomy, resection of perforated sigmoid colon, and end colostomy. We discussed the risks of surgery including bleeding, scarring, pain, obstruction, injury to intraabdominal structures, as well as exacerbation of her already severe kidney disease and heart disease with increased risk of perioperative renal failure, cardiac event, pneumonia, blood clot, stroke and death. We discussed the benefit of treating her intraabdominal infection definitively and giving her a chance at returning home with a reasonable quality of life.   We also discussed the risks of NO surgery, including sepsis/ septic shock, again precipitation of an acute cardiac event or renal failure needing dialysis, worsening pain, and certainly death.  She and her daughter are not ready to decide. We had a long conversation and I answered all of the questions they had. I think that given her clinical picture we can make her NPO and put back on IV antibiotics, check serial troponins this evening, and if she (and her daughter) agrees to surgery can proceed tomorrow. This is certainly a difficult situation given her cardiac and renal disease, but I made it absolutely clear to the patient and her daughter that surgical intervention is my strong recommendation.

## 2017-02-15 NOTE — Progress Notes (Signed)
Pharmacy Antibiotic Note  Alyssa [name] is a 71 y.o. female  with intra-abdominal infection.  Pharmacy has been consulted for zosyn dosing. Patient was previously on zosyn for 6 days and then transitioned over to augmentin. Recent CT showed worsening perforated diverticulitis. Patient is afebrile with worsening white count. Creatinine is improving; however, CrCl 10-15 mL/min.   Plan: Zosyn 2.25gm IVq8h Monitor renal function, clinical status, LOT, ability to de-escalate  Height: 5\' 3"  (160 cm) Weight: 193 lb 12.6 oz (87.9 kg) IBW/kg (Calculated) : 52.4  Temp (24hrs), Avg:98.1 F (36.7 C), Min:97.7 F (36.5 C), Max:98.4 F (36.9 C)   Recent Labs Lab 02/09/17 1531 02/09/17 1539 02/09/17 1828 02/10/17 0604 02/11/17 0527 02/12/17 0539 02/13/17 0513 02/14/17 0513 02/15/17 0529  WBC  --   --   --  12.5* 13.6* 12.1* 8.0 7.8 12.3*  CREATININE 4.28*  --   --  4.03* 4.53* 4.65*  --  4.10* 3.79*  LATICACIDVEN 1.0 0.99 1.2  --   --   --   --   --   --     Estimated Creatinine Clearance: 14.1 mL/min (A) (by C-G formula based on SCr of 3.79 mg/dL (H)).    Allergies  Allergen Reactions  . Ciprofloxacin Hcl Diarrhea  . Cymbalta [Duloxetine Hcl] Other (See Comments)    "Ineffective"  . Lipitor [Atorvastatin] Other (See Comments)    Makes the patient "not feel right"  . Xanax [Alprazolam] Other (See Comments)    Excessive sleepiness  . Codeine     Reaction not recalled  . Lortab [Hydrocodone-Acetaminophen] Other (See Comments)    Reaction not recalled  . Oxycodone Other (See Comments)    Reaction not recalled    Zosyn 7/21 >>2/26, 7/27>> Augmentin 7/27 x3  7/21 BCx: ngtd Thank you for allowing pharmacy to be a part of this patient's care.  Dierdre Harness, PharmD Clinical Pharmacist [phone] (Pager) 02/15/2017 5:06 PM

## 2017-02-15 NOTE — Progress Notes (Signed)
    MH:[id number] pain   Subjective: Complaining of chest pain and has a K pad over her chest.  SOB and she is wheezing some.  She still has abdominal pain with voiding and with BM.  Objective: Vital signs in last 24 hours: Temp:  [97.7 F (36.5 C)-98.4 F (36.9 C)] 98.1 F (36.7 C) (07/27 0445) Pulse Rate:  [67-81] 81 (07/27 0445) Resp:  [18-19] 18 (07/27 0445) BP: (155-185)/(60-97) 185/97 (07/27 0445) SpO2:  [95 %-97 %] 97 % (07/26 2132) Weight:  [87.9 kg (193 lb 12.6 oz)] 87.9 kg (193 lb 12.6 oz) (07/27 0500) Last BM Date: 02/14/17 720 PO Voided x 7 Stool x 1 Afebrile, VSS  BP is up. WBC is up again K+ 3.3 Creatinine 3.54  Intake/Output from previous day: 07/26 0701 - 07/27 0700 In: 720 [P.O.:720] Out: -  Intake/Output this shift: No intake/output data recorded.  General appearance: alert, cooperative, no distress and multiple complaints and concerns.   Resp: does not want to take deep breaths, she is wheezing bilat. GI: soft, non-tender; bowel sounds normal; no masses,  no organomegaly and pain with BM and voiding persist LE:  Trace to +1 edema both lower legs Lab Results:   Recent Labs  02/14/17 0513 02/15/17 0529  WBC 7.8 12.3*  HGB 9.4* 9.8*  HCT 28.3* 30.1*  PLT 333 380    BMET  Recent Labs  02/14/17 0513 02/15/17 0529  NA 138 138  K 3.4* 3.3*  CL 108 107  CO2 20* 20*  GLUCOSE 130* 168*  BUN 71* 66*  CREATININE 4.10* 3.79*  CALCIUM 10.0 10.2   PT/INR No results for input(s): LABPROT, INR in the last 72 hours.   Recent Labs Lab 02/09/17 1246 02/10/17 0604  AST 22 30  ALT 16 23  ALKPHOS 64 73  BILITOT 0.7 0.8  PROT 6.5 6.6  ALBUMIN 3.3* 2.6*     Lipase     Component Value Date/Time   LIPASE 22 02/09/2017 1246     Medications: . amoxicillin-clavulanate  1 tablet Oral Q24H  . carvedilol  3.125 mg Oral BID WC  . feeding supplement (GLUCERNA SHAKE)  237 mL Oral BID BM  . insulin aspart  0-15 Units Subcutaneous TID WC  .  insulin aspart  0-5 Units Subcutaneous QHS  . insulin glargine  5 Units Subcutaneous QHS  . isosorbide-hydrALAZINE  1 tablet Oral TID  . latanoprost  1 drop Both Eyes QPM  . levothyroxine  75 mcg Oral QAC breakfast  . timolol  1 drop Both Eyes BID    Assessment/Plan  Diverticulitis Chronic kidney disease - Creatinine around 4 since June 2018 Echo 01/05/17: EF 92-11% grade 1 diastolic dysfunction - hx of chronic diastolic heart failure Bipolar disorder AODM Type II Chronic back pain Hypothyroid Body mass index is 33.8 Hypertension Glaucoma  Hx of anemia FEN: full liquids/carb mod diet ID: Zosyn 02/09/17 -02/12/17 lost IV =>> Augmentin 7/25 =>> day 3 DVT: SCD - she can be on anticoagulant from our standpoint  Plan:  Will repeat her CT this AM, No IV contrast but we will try to get some oral contrast.  I will order a CXR also and discuss with DR. Maryland Pink.  I have also reordered labs for AM.    LOS: 6 days    Lanina Larranaga 02/15/2017 [phone]

## 2017-02-15 NOTE — Progress Notes (Addendum)
[facility] PROGRESS NOTE  Alyssa [name] XBM:[id number] DOB: 1945/09/02 DOA: 02/09/2017  PCP: Haywood Pao, MD  Brief History/Interval Summary: 71 year old female with a past medical history of bipolar disorder, type 2 diabetes, chronic back pain, chronic kidney disease stage III, presented with abdominal pain. Was found to have diverticulitis with microperforation.  Reason for Visit: Acute diverticulitis  Consultants: Gen. surgery  Procedures: None  Antibiotics: Was on Zosyn. Changed over to Augmentin 7/25.  Subjective/Interval History: Patient developed chest pain overnight. She states that it's located in the left side of her chest radiating to the left shoulder. Also having some difficulty breathing. Denies any abdominal pain.  ROS: Some nausea but no vomiting  Objective:  Vital Signs  Vitals:   02/14/17 1431 02/14/17 2132 02/15/17 0445 02/15/17 0500  BP: (!) 155/60 (!) 179/82 (!) 185/97   Pulse: 68 67 81   Resp: 18 19 18    Temp: 97.7 F (36.5 C) 98.4 F (36.9 C) 98.1 F (36.7 C)   TempSrc: Oral Oral Oral   SpO2: 95% 97%    Weight:    87.9 kg (193 lb 12.6 oz)  Height:        Intake/Output Summary (Last 24 hours) at 02/15/17 0835 Last data filed at 02/14/17 1700  Gross per 24 hour  Intake              720 ml  Output                0 ml  Net              720 ml   Filed Weights   02/10/17 0519 02/12/17 0635 02/15/17 0500  Weight: 86.6 kg (190 lb 14.4 oz) 87.2 kg (192 lb 4.8 oz) 87.9 kg (193 lb 12.6 oz)    General appearance: Awake, alert. In no distress Resp: Scattered wheezes appreciated on examination. No crackles Cardio: S1, S2 is normal, regular. No S3, S4. No rubs, murmurs, or bruit Chest Wall: Tender to palpation GI: Abdomen is soft. Minimal tenderness in the lower abdomen without any rebound, rigidity or guarding. Bowel sounds are present. No masses or organomegaly Extremities: No edema Neurologic: No focal deficits  Lab  Results:  Data Reviewed: I have personally reviewed following labs and imaging studies  CBC:  Recent Labs Lab 02/09/17 1246  02/11/17 0527 02/12/17 0539 02/13/17 0513 02/14/17 0513 02/15/17 0529  WBC 8.3  < > 13.6* 12.1* 8.0 7.8 12.3*  NEUTROABS 7.5  --  12.2* 10.6*  --   --   --   HGB 9.8*  < > 8.6* 8.9* 8.7* 9.4* 9.8*  HCT 29.9*  < > 26.2* 28.1* 27.4* 28.3* 30.1*  MCV 87.2  < > 86.5 87.5 86.2 86.8 85.0  PLT 298  < > 303 337 318 333 380  < > = values in this interval not displayed.  Basic Metabolic Panel:  Recent Labs Lab 02/10/17 0604 02/11/17 0527 02/12/17 0539 02/14/17 0513 02/15/17 0529  NA 136 137 139 138 138  K 3.5 3.6 3.5 3.4* 3.3*  CL 102 103 105 108 107  CO2 22 20* 21* 20* 20*  GLUCOSE 179* 128* 106* 130* 168*  BUN 67* 80* 83* 71* 66*  CREATININE 4.03* 4.53* 4.65* 4.10* 3.79*  CALCIUM 8.8* 9.5 9.8 10.0 10.2    GFR: Estimated Creatinine Clearance: 14.1 mL/min (A) (by C-G formula based on SCr of 3.79 mg/dL (H)).  Liver Function Tests:  Recent Labs Lab 02/09/17 1246 02/10/17 0604  AST 22 30  ALT 16 23  ALKPHOS 64 73  BILITOT 0.7 0.8  PROT 6.5 6.6  ALBUMIN 3.3* 2.6*     Recent Labs Lab 02/09/17 1246  LIPASE 22   CBG:  Recent Labs Lab 02/14/17 0759 02/14/17 1227 02/14/17 1653 02/14/17 2206 02/15/17 0814  GLUCAP 123* 129* 188* 189* 179*     Recent Results (from the past 240 hour(s))  Culture, blood (Routine X 2) w Reflex to ID Panel     Status: None   Collection Time: 02/09/17  3:40 PM  Result Value Ref Range Status   Specimen Description BLOOD RIGHT ANTECUBITAL  Final   Special Requests   Final    BOTTLES DRAWN AEROBIC AND ANAEROBIC Blood Culture adequate volume   Culture NO GROWTH 5 DAYS  Final   Report Status 02/14/2017 FINAL  Final  Culture, blood (Routine X 2) w Reflex to ID Panel     Status: None   Collection Time: 02/09/17  6:35 PM  Result Value Ref Range Status   Specimen Description BLOOD RIGHT HAND  Final    Special Requests   Final    BOTTLES DRAWN AEROBIC AND ANAEROBIC Blood Culture results may not be optimal due to an inadequate volume of blood received in culture bottles   Culture NO GROWTH 5 DAYS  Final   Report Status 02/14/2017 FINAL  Final      Radiology Studies: No results found.   Medications:  Scheduled: . amoxicillin-clavulanate  1 tablet Oral Q24H  . carvedilol  3.125 mg Oral BID WC  . feeding supplement (GLUCERNA SHAKE)  237 mL Oral BID BM  . furosemide  60 mg Intravenous Once  . insulin aspart  0-15 Units Subcutaneous TID WC  . insulin aspart  0-5 Units Subcutaneous QHS  . insulin glargine  5 Units Subcutaneous QHS  . isosorbide-hydrALAZINE  1 tablet Oral TID  . latanoprost  1 drop Both Eyes QPM  . levothyroxine  75 mcg Oral QAC breakfast  . pantoprazole  40 mg Oral Q1200  . timolol  1 drop Both Eyes BID   Continuous:  OTL:[id number], diphenhydrAMINE, morphine injection, ondansetron **OR** ondansetron (ZOFRAN) IV, pentafluoroprop-tetrafluoroeth, sodium phosphate, traMADol  Assessment/Plan:  Principal Problem:   Diverticulitis Active Problems:   Hypertension   Bipolar 1 disorder (HCC)   Morbid obesity (BMI 39.21)    Depression, major, in partial remission (HCC)   CKD (chronic kidney disease) stage 3, GFR 30-59 ml/min   Hypothyroidism   T2_NIDDM   Anemia   Congestive heart failure (CHF) (HCC)    Acute diverticulitis with microperforation. Patient was hospitalized and started on IV antibiotics. Seen by general surgery. Patient had been improving. Her WBC had improved to normal. However, noted to be elevated this morning. General surgery plans to repeat her CT scan. She was initially on IV antibiotics but was changed over to oral Augmentin as she did not want another IV placed. Diet was advanced. Abdomen seems benign. Chest x-ray raises concern for free air in the abdomen. CT is pending.  Chest Pain Patient developed chest pain overnight. Not a very good  historian. There is some reproducibility of this pain with palpation. However, EKG was done and shows T-wave inversion in anterolateral leads. This is new compared to her previous EKG. Chest x-ray does not show any overt pulmonary edema. Troponin is 0.08. Her symptoms, along with EKG changes are concerning. Cardiology will be consulted. Aspirin. May have to consider anticoagulation as well, though her abdominal issues do  complicate this matter. Chest pain somewhat atypical for pulmonary embolism. She is on a beta blocker.  Normocytic anemia Albumin is stable. No evidence for overt bleeding.  Chronic kidney disease stage III Creatinine close to baseline. Patient was recently hospitalized in July for syncope thought to be due to aggressive diuresis. Her dose of Lasix was reduced to 80 twice a day. She has been off of Lasix here. She will be given a dose today since her weight has been creeping up.  Chronic diastolic CHF. Stable. Resume Lasix.  History of essential hypertension. Stable.  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 Continue to monitor CBGs. Continue Lantus. Holding her oral agents.  Hypothyroidism. Continue with levothyroxine  ADDENDUM Abnormal CT report reviewed. Discussed with Dr. Kae Heller with the general surgery. She has discussed with patient and family. They want to think about this before pursuing surgery. There is no emergent indication to operate immediately. Recommends reinitiating Zosyn, which we have ordered. Continue nothing by mouth status. Cardiac issues are going to play into the decision-making process. Cardiology is following. Patient has been started on IV heparin. She is on a beta blocker. Aspirin.   DVT Prophylaxis: SCDs    Code Status: Full code  Family Communication: Discussed with patient and her daughter  Disposition Plan: Management as outlined above.    LOS: 6 days   [facility] Pager [phone] 02/15/2017,  8:35 AM  If 7PM-7AM, please contact night-coverage at [email or web address], password [facility]

## 2017-02-15 NOTE — Patient Outreach (Signed)
[facility]) Care Management  02/15/17  Alyssa [name] 17-Jan-1946 [id number]  Collaboration with [facility] Management hospital liaison [place]. Per [name], she visited with patient and daughter and currently, they are declining [place].   RNCM to close referral due to declination of services at this time.  [name], RN, BSN, [place] Management Coordinator [phone]

## 2017-02-15 NOTE — Progress Notes (Signed)
Patient transferred to 4N 27 accompanied by Godfrey Pick, RN

## 2017-02-15 NOTE — Progress Notes (Signed)
ANTICOAGULATION CONSULT NOTE - Follow Up Consult  Pharmacy Consult for heparin Indication: chest pain/ACS  Labs:  Recent Labs  02/13/17 0513 02/14/17 0513 02/15/17 0529 02/15/17 1201 02/15/17 1912 02/15/17 2134  HGB 8.7* 9.4* 9.8*  --   --   --   HCT 27.4* 28.3* 30.1*  --   --   --   PLT 318 333 380  --   --   --   HEPARINUNFRC  --   --   --   --  1.60* 0.10*  CREATININE  --  4.10* 3.79*  --   --   --   TROPONINI  --   --  0.08* 0.06* 0.04*  --     Assessment: 71yo female subtherapeutic on heparin with initial dosing for CP (note initial heparin level was drawn where heparin was running so was therefore inaccurate).  Goal of Therapy:  Heparin level 0.3-0.7 units/ml   Plan:  Will rebolus with heparin 2000 units and increase gtt by 3 units/kg/hr to 1100 units/hr and check level in [place], PharmD, BCPS  02/15/2017,11:41 PM

## 2017-02-15 NOTE — Progress Notes (Signed)
  Echocardiogram Echocardiogram Transesophageal has been performed.  Alyssa [name] M 02/15/2017, 2:03 PM

## 2017-02-15 NOTE — Progress Notes (Signed)
Report called to [facility], RN on 6N.

## 2017-02-15 NOTE — Progress Notes (Signed)
ANTICOAGULATION CONSULT NOTE - Initial Consult  Pharmacy Consult for Heparin Indication: chest pain/ACS  Patient Measurements: Height: 5\' 3"  (160 cm) Weight: 193 lb 12.6 oz (87.9 kg) IBW/kg (Calculated) : 52.4 Heparin Dosing Weight: 72 kg  Assessment: Surgery ok for anticoagulation.  See prior note at 12:38 PM for full assessment.   Goal of Therapy:  Heparin level 0.3-0.7 units/ml Monitor platelets by anticoagulation protocol: Yes   Plan:  Initiate Heparin bolus of 4000 units x1. Initiate Heparin drip at 850 units/hr. Heparin level in 8 hours Daily Heparin level and CBC while on therapy.  Sloan Leiter, PharmD, BCPS Clinical Pharmacist Clinical Phone 02/15/2017 until 3:30 PM- [phone] After hours, please call #[phone] 02/15/2017,1:30 PM

## 2017-02-15 NOTE — Consult Note (Signed)
   [facility] Inpatient Consult   02/15/2017  AQSA SENSABAUGH [birth year] [id number]   [facility] Care Management follow up bedside attempt made.  Daughter Amy indicates patient is not feeling well today.   Asked if they were interested in ongoing [place] Management follow up, daughter states " it is just not a good time". Made Amy aware that she can call [place] Management office should they change their mind in the future. Provided [facility] Care Management brochure with contact information and 24-hr nurse line magnet.  Will make [place] aware that family is not interested in [place] Management services at this time.  Marthenia Rolling, MSN-Ed, RN,BSN [facility] Liaison [phone]

## 2017-02-16 DIAGNOSIS — K651 Peritoneal abscess: Secondary | ICD-10-CM

## 2017-02-16 DIAGNOSIS — I200 Unstable angina: Secondary | ICD-10-CM

## 2017-02-16 LAB — CBC
HCT: 29.4 % — ABNORMAL LOW (ref 36.0–46.0)
Hemoglobin: 9.5 g/dL — ABNORMAL LOW (ref 12.0–15.0)
MCH: 27.6 pg (ref 26.0–34.0)
MCHC: 32.3 g/dL (ref 30.0–36.0)
MCV: 85.5 fL (ref 78.0–100.0)
PLATELETS: 370 10*3/uL (ref 150–400)
RBC: 3.44 MIL/uL — ABNORMAL LOW (ref 3.87–5.11)
RDW: 14.8 % (ref 11.5–15.5)
WBC: 10 10*3/uL (ref 4.0–10.5)

## 2017-02-16 LAB — HEPARIN LEVEL (UNFRACTIONATED)
HEPARIN UNFRACTIONATED: 0.44 [IU]/mL (ref 0.30–0.70)
Heparin Unfractionated: 0.13 IU/mL — ABNORMAL LOW (ref 0.30–0.70)

## 2017-02-16 LAB — GLUCOSE, CAPILLARY
GLUCOSE-CAPILLARY: 111 mg/dL — AB (ref 65–99)
GLUCOSE-CAPILLARY: 111 mg/dL — AB (ref 65–99)
GLUCOSE-CAPILLARY: 110 mg/dL — AB (ref 65–99)
GLUCOSE-CAPILLARY: 141 mg/dL — AB (ref 65–99)

## 2017-02-16 LAB — BASIC METABOLIC PANEL
Anion gap: 10 (ref 5–15)
BUN: 61 mg/dL — ABNORMAL HIGH (ref 6–20)
CO2: 21 mmol/L — ABNORMAL LOW (ref 22–32)
CREATININE: 3.51 mg/dL — AB (ref 0.44–1.00)
Calcium: 9.8 mg/dL (ref 8.9–10.3)
Chloride: 103 mmol/L (ref 101–111)
GFR calc Af Amer: 14 mL/min — ABNORMAL LOW (ref >60)
GFR calc non Af Amer: 12 mL/min — ABNORMAL LOW (ref >60)
Glucose, Bld: 110 mg/dL — ABNORMAL HIGH (ref 65–99)
Potassium: 3.3 mmol/L — ABNORMAL LOW (ref 3.5–5.1)
Sodium: 134 mmol/L — ABNORMAL LOW (ref 135–145)

## 2017-02-16 MED ORDER — HEPARIN BOLUS VIA INFUSION
2000.0000 [IU] | Freq: Once | INTRAVENOUS | Status: AC
  Administered 2017-02-16: 2000 [IU] via INTRAVENOUS
  Filled 2017-02-16: qty 2000

## 2017-02-16 NOTE — Progress Notes (Signed)
[facility]/Alyssa [name] Progress Note    Subjective: Long, difficult discussion with the patient and her family about her decision to have surgery or not.  First of all, she does not clinically need emergency surgery--she does not have nay peritonitis, her WBC is normalizing, she has excellent bowel sounds., and she is not tender.  No fevers also.  I do agree that if she should have surgery it should be sooner than later.  She has cardiac disease and renal dysfunction.  She is on a heparin drip for likely coronary disease.  She is not septic, and even without that her risk for postoperative complications is high.  Renal failure leading to dialysis, perioperative MI, requiring for chronic ventilation, open wound management are all very possible.  The patient is very unsure if she wants to potentially go through all this.  Objective: Vital signs in last 24 hours: Temp:  [97.5 F (36.4 C)-98.3 F (36.8 C)] 97.5 F (36.4 C) (07/28 1122) Pulse Rate:  [49-72] 65 (07/28 1000) Resp:  [12-20] 18 (07/28 1000) BP: (114-170)/(62-86) 170/86 (07/28 1000) SpO2:  [99 %-100 %] 100 % (07/28 1000) Weight:  [85.6 kg (188 lb 11.4 oz)] 85.6 kg (188 lb 11.4 oz) (07/28 0500) Last BM Date: 02/15/17  Intake/Output from previous day: 07/27 0701 - 07/28 0700 In: 298 [I.V.:148; IV Piggyback:150] Out: 1200 [Urine:1200] Intake/Output this shift: No intake/output data recorded.  General: No acute distress  Lungs: clear to auscultation  Abd: Soft, excellent bowel sounds.  No tenderness  Extremities: No changes  Neuro: Intact.  Completely alert and oriented.  Lab Results:  @LABLAST2 (wbc:2,hgb:2,hct:2,plt:2) BMET ) Recent Labs  02/15/17 0529 02/16/17 0325  NA 138 134*  K 3.3* 3.3*  CL 107 103  CO2 20* 21*  GLUCOSE 168* 110*  BUN 66* 61*  CREATININE 3.79* 3.51*  CALCIUM 10.2 9.8   PT/INR No results for input(s): LABPROT, INR in the last 72 hours. ABG No results for input(s): PHART, HCO3 in the last 72  hours.  Invalid input(s): PCO2, PO2  Studies/Results: Ct Abdomen Pelvis Wo Contrast  Result Date: 02/15/2017 CLINICAL DATA:  Possible free-air on radiograph EXAM: CT ABDOMEN AND PELVIS WITHOUT CONTRAST TECHNIQUE: Multidetector CT imaging of the abdomen and pelvis was performed following the standard protocol without IV contrast. COMPARISON:  Radiograph 02/15/2017, CT 02/09/2017 FINDINGS: Lower chest: Small left pleural effusion and trace right pleural effusion. Patchy consolidation in the left greater than right lung bases, likely atelectasis. Cardiomegaly with coronary artery calcification. Small pericardial effusion. Hepatobiliary: No focal hepatic abnormality. Dilated gallbladder, measuring up to 4.2 cm. Punctate stones present in the gallbladder. No biliary dilatation. Pancreas: Unremarkable. No pancreatic ductal dilatation or surrounding inflammatory changes. Spleen: Normal in size without focal abnormality. Adrenals/Urinary Tract: Adrenal glands are within normal limits. No hydronephrosis. Probable intrarenal vascular calcifications. Bladder unremarkable. Stomach/Bowel: The stomach is nonenlarged. No dilated small bowel. Sigmoid colon diverticular disease with wall thickening and surrounding inflammation consistent with diverticulitis. Increased size of a focal gas collection posterior to the uterus, measuring 4.6 by 1.8 cm. Interim development of gas and fluid collection anterior to the uterus measuring 4.2 by 4.2 cm, suspicious for an abscess between small bowel loops. The appendix is visualized and appears within normal limits. Vascular/Lymphatic: Aortic atherosclerosis. Scattered retroperitoneal lymph nodes. Reproductive: Large calcified masses in the uterus consistent with fibroids. No adnexal masses. Other: Small amount of free fluid in the right upper quadrant. Multiple pockets of free air in the upper abdomen, dominant air collection in the anterior abdomen.  Musculoskeletal: Postsurgical changes  at L4-L5 with anterolisthesis of L4 on L5. IMPRESSION: 1. Pneumoperitoneum, consistent with hollow viscus perforation. Likely source is the rectosigmoid colon where there are findings consistent with diverticulitis. Increased gas and small fluid collection posterior to the uterus, now measuring 4.6 cm suspicious for focal perforation or possible fistula. Interim development of gas and fluid collection anterior to the uterine fundus measuring 4.2 cm, suspicious for an abscess. 2. Negative for bowel obstruction 3. Dilated gallbladder containing small calcified stones 4. Cardiomegaly.  Trace pleural effusions and bibasilar atelectasis. Critical Value/emergent results were called by telephone at the time of interpretation on 02/15/2017 at 2:33 pm to Dr. Bonnielee Haff, who verbally acknowledged these results. Electronically Signed   By: Donavan Foil M.D.   On: 02/15/2017 14:34   Dg Chest 2 View  Result Date: 02/15/2017 CLINICAL DATA:  Shortness of breath.  Chest pain . EXAM: CHEST  2 VIEW COMPARISON:  02/09/2017 . FINDINGS: Cardiomegaly. No evidence of overt pulmonary edema. Mild basilar Basilar atelectasis. Small left pleural effusion cannot be excluded. Free air under the left hemidiaphragm versus gastric distention cannot be excluded. Abdominal series suggested for further evaluation . IMPRESSION: 1. Free air under the left hemidiaphragm versus gastric distention cannot be excluded. Abdominal series suggest for further evaluation. 2. Cardiomegaly. No evidence of overt congestive heart failure . Mild left base atelectasis. Small left pleural effusion. Critical Value/emergent results were called by telephone at the time of interpretation on 02/15/2017 at 8:54 am to nurse Arbie Cookey, who verbally acknowledged these results. Electronically Signed   By: Marcello Moores  Register   On: 02/15/2017 08:57    Anti-infectives: Anti-infectives    Start     Dose/Rate Route Frequency Ordered Stop   02/15/17 1745   piperacillin-tazobactam (ZOSYN) IVPB 2.25 g     2.25 g 100 mL/hr over 30 Minutes Intravenous Every 8 hours 02/15/17 1707     02/13/17 1100  amoxicillin-clavulanate (AUGMENTIN) 875-125 MG per tablet 1 tablet  Status:  Discontinued     1 tablet Oral Every 12 hours 02/13/17 1046 02/13/17 1049   02/13/17 1100  amoxicillin-clavulanate (AUGMENTIN) 500-125 MG per tablet 500 mg  Status:  Discontinued     1 tablet Oral Every 24 hours 02/13/17 1050 02/15/17 1648   02/09/17 2300  piperacillin-tazobactam (ZOSYN) IVPB 2.25 g  Status:  Discontinued     2.25 g 100 mL/hr over 30 Minutes Intravenous Every 8 hours 02/09/17 1454 02/13/17 1046   02/09/17 1400  piperacillin-tazobactam (ZOSYN) IVPB 3.375 g     3.375 g 100 mL/hr over 30 Minutes Intravenous  Once 02/09/17 1355 02/09/17 1512      Assessment/Plan: s/p  Advance diet Clears  Patient is to decide if she would like to have surgery, b ut is currently leaning away from surgery.  Family is in agreement with whatever the patient wants to do.    LOS: 7 days   Kathryne Eriksson. Dahlia Bailiff, MD, FACS [phone] [facility] Surgery 02/16/2017

## 2017-02-16 NOTE — Progress Notes (Signed)
[facility] PROGRESS NOTE  Alyssa [name] TLX:[id number] DOB: 02-07-1946 DOA: 02/09/2017  PCP: Haywood Pao, MD  Brief History/Interval Summary: 71 year old female with a past medical history of bipolar disorder, type 2 diabetes, chronic back pain, chronic kidney disease stage III, presented with abdominal pain. Was found to have diverticulitis with microperforation. She started improving. On the morning of 7/27 she complained of chest pain. EKG showed new changes. Cardiology was consulted. CT scan of the abdomen was repeated and showed worsening findings.  Reason for Visit: Acute diverticulitis  Consultants: Gen. surgery. Cardiology  Procedures:   Transthoracic echocardiogram Study Conclusions - Left ventricle: The cavity size was normal. Wall thickness was   increased in a pattern of moderate LVH. Systolic function was   normal. The estimated ejection fraction was in the range of 55%   to 60%. Doppler parameters are consistent with abnormal left   ventricular relaxation (grade 1 diastolic dysfunction). The E/e&'   ratio is >20, suggesting markedly elevated LV filling pressure. - Left atrium: The atrium was normal in size. - Inferior vena cava: The vessel was normal in size. The   respirophasic diameter changes were in the normal range (= 50%),   consistent with normal central venous pressure. - Pericardium, extracardiac: A trivial pericardial effusion was   identified posterior to the heart. Impressions:  - Compared to a prior study in 12/2016, the LVEF is higher at   55-60%. There is grade 1 DD with high LV filling pressure. A   trivial posterior pericardial effusion was noted.  Antibiotics: Was on Zosyn. Changed over to Augmentin 7/25. Placed back on Zosyn. On 7/27  Subjective/Interval History: Patient denies any chest pain currently. She mentions that the pain started resolving once she was given nitroglycerin. Denies any shortness of breath. No nausea or  vomiting. Denies any significant abdominal pain. She remains uncertain about undergoing surgery.  ROS: Denies any headaches  Objective:  Vital Signs  Vitals:   02/16/17 0300 02/16/17 0400 02/16/17 0500 02/16/17 0800  BP: 114/67 (!) 146/67 (!) 160/69   Pulse: (!) 50 (!) 53 (!) 56   Resp: 14 14 12    Temp:  97.7 F (36.5 C)  97.8 F (36.6 C)  TempSrc:  Oral  Oral  SpO2: 99% 99% 99%   Weight:   85.6 kg (188 lb 11.4 oz)   Height:        Intake/Output Summary (Last 24 hours) at 02/16/17 0852 Last data filed at 02/16/17 7416  Gross per 24 hour  Intake           298.04 ml  Output             1200 ml  Net          -901.96 ml   Filed Weights   02/12/17 0635 02/15/17 0500 02/16/17 0500  Weight: 87.2 kg (192 lb 4.8 oz) 87.9 kg (193 lb 12.6 oz) 85.6 kg (188 lb 11.4 oz)    General appearance: Awake and alert. In no distress. Resp: Good air entry bilaterally. No wheezing, rales or rhonchi. Cardio: S1, S2 is normal, regular. No S3, S4. No rubs, murmurs or bruit GI: Abdomen remains soft. Nontender, non-distended. Bowel sounds are present. No masses or organomegaly  Extremities: No edema Neurologic: No focal deficits  Lab Results:  Data Reviewed: I have personally reviewed following labs and imaging studies  CBC:  Recent Labs Lab 02/09/17 1246  02/11/17 3845 02/12/17 0539 02/13/17 3646 02/14/17 8032 02/15/17 1224 02/16/17 0325  WBC 8.3  < > 13.6* 12.1* 8.0 7.8 12.3* 10.0  NEUTROABS 7.5  --  12.2* 10.6*  --   --   --   --   HGB 9.8*  < > 8.6* 8.9* 8.7* 9.4* 9.8* 9.5*  HCT 29.9*  < > 26.2* 28.1* 27.4* 28.3* 30.1* 29.4*  MCV 87.2  < > 86.5 87.5 86.2 86.8 85.0 85.5  PLT 298  < > 303 337 318 333 380 370  < > = values in this interval not displayed.  Basic Metabolic Panel:  Recent Labs Lab 02/11/17 0527 02/12/17 0539 02/14/17 0513 02/15/17 0529 02/16/17 0325  NA 137 139 138 138 134*  K 3.6 3.5 3.4* 3.3* 3.3*  CL 103 105 108 107 103  CO2 20* 21* 20* 20* 21*    GLUCOSE 128* 106* 130* 168* 110*  BUN 80* 83* 71* 66* 61*  CREATININE 4.53* 4.65* 4.10* 3.79* 3.51*  CALCIUM 9.5 9.8 10.0 10.2 9.8    GFR: Estimated Creatinine Clearance: 15 mL/min (A) (by C-G formula based on SCr of 3.51 mg/dL (H)).  Liver Function Tests:  Recent Labs Lab 02/09/17 1246 02/10/17 0604 02/15/17 0529  AST 22 30 24   ALT 16 23 33  ALKPHOS 64 73 239*  BILITOT 0.7 0.8 0.9  PROT 6.5 6.6 5.9*  ALBUMIN 3.3* 2.6* 2.6*     Recent Labs Lab 02/09/17 1246  LIPASE 22   CBG:  Recent Labs Lab 02/15/17 0814 02/15/17 1223 02/15/17 1639 02/15/17 2110 02/16/17 0739  GLUCAP 179* 167* 129* 128* 111*     Recent Results (from the past 240 hour(s))  Culture, blood (Routine X 2) w Reflex to ID Panel     Status: None   Collection Time: 02/09/17  3:40 PM  Result Value Ref Range Status   Specimen Description BLOOD RIGHT ANTECUBITAL  Final   Special Requests   Final    BOTTLES DRAWN AEROBIC AND ANAEROBIC Blood Culture adequate volume   Culture NO GROWTH 5 DAYS  Final   Report Status 02/14/2017 FINAL  Final  Culture, blood (Routine X 2) w Reflex to ID Panel     Status: None   Collection Time: 02/09/17  6:35 PM  Result Value Ref Range Status   Specimen Description BLOOD RIGHT HAND  Final   Special Requests   Final    BOTTLES DRAWN AEROBIC AND ANAEROBIC Blood Culture results may not be optimal due to an inadequate volume of blood received in culture bottles   Culture NO GROWTH 5 DAYS  Final   Report Status 02/14/2017 FINAL  Final  MRSA PCR Screening     Status: None   Collection Time: 02/15/17  2:25 PM  Result Value Ref Range Status   MRSA by PCR NEGATIVE NEGATIVE Final    Comment:        The GeneXpert MRSA Assay (FDA approved for NASAL specimens only), is one component of a comprehensive MRSA colonization surveillance program. It is not intended to diagnose MRSA infection nor to guide or monitor treatment for MRSA infections.       Radiology Studies: Ct  Abdomen Pelvis Wo Contrast  Result Date: 02/15/2017 CLINICAL DATA:  Possible free-air on radiograph EXAM: CT ABDOMEN AND PELVIS WITHOUT CONTRAST TECHNIQUE: Multidetector CT imaging of the abdomen and pelvis was performed following the standard protocol without IV contrast. COMPARISON:  Radiograph 02/15/2017, CT 02/09/2017 FINDINGS: Lower chest: Small left pleural effusion and trace right pleural effusion. Patchy consolidation in the left greater than right lung bases,  likely atelectasis. Cardiomegaly with coronary artery calcification. Small pericardial effusion. Hepatobiliary: No focal hepatic abnormality. Dilated gallbladder, measuring up to 4.2 cm. Punctate stones present in the gallbladder. No biliary dilatation. Pancreas: Unremarkable. No pancreatic ductal dilatation or surrounding inflammatory changes. Spleen: Normal in size without focal abnormality. Adrenals/Urinary Tract: Adrenal glands are within normal limits. No hydronephrosis. Probable intrarenal vascular calcifications. Bladder unremarkable. Stomach/Bowel: The stomach is nonenlarged. No dilated small bowel. Sigmoid colon diverticular disease with wall thickening and surrounding inflammation consistent with diverticulitis. Increased size of a focal gas collection posterior to the uterus, measuring 4.6 by 1.8 cm. Interim development of gas and fluid collection anterior to the uterus measuring 4.2 by 4.2 cm, suspicious for an abscess between small bowel loops. The appendix is visualized and appears within normal limits. Vascular/Lymphatic: Aortic atherosclerosis. Scattered retroperitoneal lymph nodes. Reproductive: Large calcified masses in the uterus consistent with fibroids. No adnexal masses. Other: Small amount of free fluid in the right upper quadrant. Multiple pockets of free air in the upper abdomen, dominant air collection in the anterior abdomen. Musculoskeletal: Postsurgical changes at L4-L5 with anterolisthesis of L4 on L5. IMPRESSION: 1.  Pneumoperitoneum, consistent with hollow viscus perforation. Likely source is the rectosigmoid colon where there are findings consistent with diverticulitis. Increased gas and small fluid collection posterior to the uterus, now measuring 4.6 cm suspicious for focal perforation or possible fistula. Interim development of gas and fluid collection anterior to the uterine fundus measuring 4.2 cm, suspicious for an abscess. 2. Negative for bowel obstruction 3. Dilated gallbladder containing small calcified stones 4. Cardiomegaly.  Trace pleural effusions and bibasilar atelectasis. Critical Value/emergent results were called by telephone at the time of interpretation on 02/15/2017 at 2:33 pm to Dr. Bonnielee Haff, who verbally acknowledged these results. Electronically Signed   By: Donavan Foil M.D.   On: 02/15/2017 14:34   Dg Chest 2 View  Result Date: 02/15/2017 CLINICAL DATA:  Shortness of breath.  Chest pain . EXAM: CHEST  2 VIEW COMPARISON:  02/09/2017 . FINDINGS: Cardiomegaly. No evidence of overt pulmonary edema. Mild basilar Basilar atelectasis. Small left pleural effusion cannot be excluded. Free air under the left hemidiaphragm versus gastric distention cannot be excluded. Abdominal series suggested for further evaluation . IMPRESSION: 1. Free air under the left hemidiaphragm versus gastric distention cannot be excluded. Abdominal series suggest for further evaluation. 2. Cardiomegaly. No evidence of overt congestive heart failure . Mild left base atelectasis. Small left pleural effusion. Critical Value/emergent results were called by telephone at the time of interpretation on 02/15/2017 at 8:54 am to nurse Arbie Cookey, who verbally acknowledged these results. Electronically Signed   By: Marcello Moores  Register   On: 02/15/2017 08:57     Medications:  Scheduled: . aspirin  325 mg Oral Daily  . carvedilol  6.25 mg Oral BID WC  . furosemide  80 mg Oral BID  . hydrALAZINE  25 mg Oral Q8H  . insulin aspart  0-15  Units Subcutaneous TID WC  . insulin aspart  0-5 Units Subcutaneous QHS  . insulin glargine  5 Units Subcutaneous QHS  . latanoprost  1 drop Both Eyes QPM  . levothyroxine  75 mcg Oral QAC breakfast  . pantoprazole  40 mg Oral Q1200  . timolol  1 drop Both Eyes BID   Continuous: . heparin 1,100 Units/hr (02/16/17 0600)  . nitroGLYCERIN Stopped (02/15/17 2123)  . piperacillin-tazobactam (ZOSYN)  IV Stopped (02/16/17 0710)   SWF:[id number], diphenhydrAMINE, morphine injection, ondansetron **OR** ondansetron (ZOFRAN) IV, pentafluoroprop-tetrafluoroeth, sodium phosphate,  traMADol  Assessment/Plan:  Principal Problem:   Diverticulitis Active Problems:   Hypertension   Bipolar 1 disorder (HCC)   Morbid obesity (BMI 39.21)    Depression, major, in partial remission (HCC)   CKD (chronic kidney disease) stage 3, GFR 30-59 ml/min   Hypothyroidism   T2_NIDDM   Anemia   Congestive heart failure (CHF) (HCC)    Acute diverticulitis with perforation and intra-abdominal abscess Patient was hospitalized and started on IV antibiotics. Seen by general surgery. Patient had been improving. Her WBC had improved to normal. She was changed over to oral antibiotics. However, WBC noted to be elevated on 7/27. Free and noted on chest x-ray. CT scan was done which shows pneumoperitoneum along with concern for bowel perforation and abscess. General surgery is following and discussing with patient regarding further management. They have recommended surgery. Patient is unsure. She has been told about potential complication and risk of further worsening without intervention. She was changed back to intravenous Zosyn. Remain nothing by mouth for now. Cardiology will need to clear her before she can undergo surgery. This is apparently not an emergent condition per general surgery  Chest Pain/Unstable Angina Vs NSTEMI Patient was found to have mildly elevated troponin along with new EKG changes. Patient seen by  cardiology. Echocardiogram as above. She was placed on IV heparin and was also on IV nitroglycerin for the duration. Chest pain has resolved. EKG with stable changes. This is a difficult situation. General surgery is recommending surgical intervention for her intra-abdominal abnormalities. She does have chronic kidney disease, any invasive testing will increase the risk of worsening renal function. Patient unsure of surgery at this time. Cardiology to continue to follow.   Normocytic anemia Albumin is stable. No evidence for overt bleeding.  Chronic kidney disease stage III Creatinine close to baseline. Patient was recently hospitalized in July for syncope thought to be due to aggressive diuresis. Her dose of Lasix was reduced to 80 twice a day. She has been off of Lasix here. Patient's weight has been increasing. She was placed back on her Lasix as of yesterday.   Chronic diastolic CHF. Seems euvolemic currently. Continue Lasix.  History of essential hypertension. Stable.  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 Continue to monitor CBGs. Continue Lantus. Holding her oral agents.  Hypothyroidism. Continue with levothyroxine  DVT Prophylaxis: SCDs    Code Status: Full code  Family Communication: Discussed with patient and her daughter  Disposition Plan: Management as outlined above.    LOS: 7 days   [facility] Pager [phone] 02/16/2017, 8:52 AM  If 7PM-7AM, please contact night-coverage at [email or web address], password [facility]

## 2017-02-16 NOTE — Progress Notes (Signed)
ANTICOAGULATION CONSULT NOTE - Follow Up Consult  Pharmacy Consult for heparin Indication: chest pain/ACS  Labs:  Recent Labs  02/14/17 0513 02/15/17 0529 02/15/17 1201 02/15/17 1912 02/15/17 2134 02/16/17 0325 02/16/17 0752  HGB 9.4* 9.8*  --   --   --  9.5*  --   HCT 28.3* 30.1*  --   --   --  29.4*  --   PLT 333 380  --   --   --  370  --   HEPARINUNFRC  --   --   --  1.60* 0.10*  --  0.13*  CREATININE 4.10* 3.79*  --   --   --  3.51*  --   TROPONINI  --  0.08* 0.06* 0.04*  --   --   --     Assessment: 71yo female subtherapeutic on heparin at 1100 units/hr for CP. HL this AM is subtherapeutic at 0.13. H/H low stable. PLt wnl. RN reports no s/s of overt bleeding.   Goal of Therapy:  Heparin level 0.3-0.7 units/ml   Plan:  Will rebolus with heparin 2000 units and increase gtt by 3 units/kg/hr to 1350 units/hr and check level in 8hr.  Albertina Parr, PharmD., BCPS Clinical Pharmacist Pager [phone]

## 2017-02-16 NOTE — Progress Notes (Signed)
ANTICOAGULATION CONSULT NOTE - Follow Up Consult  Pharmacy Consult for heparin Indication: chest pain/ACS  Labs:  Recent Labs  02/14/17 0513 02/15/17 0529 02/15/17 1201  02/15/17 1912 02/15/17 2134 02/16/17 0325 02/16/17 0752 02/16/17 1808  HGB 9.4* 9.8*  --   --   --   --  9.5*  --   --   HCT 28.3* 30.1*  --   --   --   --  29.4*  --   --   PLT 333 380  --   --   --   --  370  --   --   HEPARINUNFRC  --   --   --   < > 1.60* 0.10*  --  0.13* 0.44  CREATININE 4.10* 3.79*  --   --   --   --  3.51*  --   --   TROPONINI  --  0.08* 0.06*  --  0.04*  --   --   --   --   < > = values in this interval not displayed.  Assessment: 71yo female continues on heparin for ACS. Rate was increased this morning after a low level and this evening's heparin level is in range at 0.44 units/mL.  No bleeding noted. CBC overall stable.  Goal of Therapy:  Heparin level 0.3-0.7 units/ml   Plan:  Continue heparin at 1350 units/h Daily heparin level and CBC Follow for s/s bleeding    Thimothy Barretta D. Christyan Reger, PharmD, BCPS Clinical Pharmacist Pager: [phone] If after 3:30pm, please call main pharmacy at [phone] 02/16/2017 7:20 PM

## 2017-02-16 NOTE — Progress Notes (Signed)
   Progress Note  Patient Name: Alyssa [name] Date of Encounter: 02/16/2017   Primary Cardiologist: Dr. Shelva Majestic  Please see full cardiology consultation and recommendations from yesterday's note. 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 Currently patient/family deciding about potential surgical management, although at high risk for perioperative cardiac event. Risk of contrast nephropathy is high with cardiac catheterization and this was deferred by Dr. Claiborne Billings. She is being managed medically at this time on ASA, Coreg, Hydralazine, Lasix, Heparin GTT, and recently NTG GTT. ECG today shows anterolateral ST/T wave abnormalities as before but somewhat less prominent. Echocardiogram shows LVEF 55-60% without description of wall motion abnormalities and trivial pericardial effusion.   Would continue with medical therapy from a cardiac perspective. Will will continue to follow with you pending final decision about surgery.  Signed, Rozann Lesches, MD  02/16/2017, 12:19 PM

## 2017-02-17 DIAGNOSIS — Z515 Encounter for palliative care: Secondary | ICD-10-CM

## 2017-02-17 DIAGNOSIS — Z7189 Other specified counseling: Secondary | ICD-10-CM

## 2017-02-17 DIAGNOSIS — K5720 Diverticulitis of large intestine with perforation and abscess without bleeding: Secondary | ICD-10-CM

## 2017-02-17 DIAGNOSIS — I25110 Atherosclerotic heart disease of native coronary artery with unstable angina pectoris: Secondary | ICD-10-CM

## 2017-02-17 DIAGNOSIS — Z66 Do not resuscitate: Secondary | ICD-10-CM

## 2017-02-17 LAB — CBC
HEMATOCRIT: 28.5 % — AB (ref 36.0–46.0)
Hemoglobin: 9.5 g/dL — ABNORMAL LOW (ref 12.0–15.0)
MCH: 28.2 pg (ref 26.0–34.0)
MCHC: 33.3 g/dL (ref 30.0–36.0)
MCV: 84.6 fL (ref 78.0–100.0)
PLATELETS: 358 10*3/uL (ref 150–400)
RBC: 3.37 MIL/uL — ABNORMAL LOW (ref 3.87–5.11)
RDW: 14.8 % (ref 11.5–15.5)
WBC: 10.5 10*3/uL (ref 4.0–10.5)

## 2017-02-17 LAB — BASIC METABOLIC PANEL
Anion gap: 10 (ref 5–15)
BUN: 59 mg/dL — AB (ref 6–20)
CALCIUM: 9.8 mg/dL (ref 8.9–10.3)
CHLORIDE: 101 mmol/L (ref 101–111)
CO2: 23 mmol/L (ref 22–32)
CREATININE: 3.64 mg/dL — AB (ref 0.44–1.00)
GFR calc Af Amer: 13 mL/min — ABNORMAL LOW (ref >60)
GFR calc non Af Amer: 12 mL/min — ABNORMAL LOW (ref >60)
GLUCOSE: 108 mg/dL — AB (ref 65–99)
Potassium: 3.2 mmol/L — ABNORMAL LOW (ref 3.5–5.1)
Sodium: 134 mmol/L — ABNORMAL LOW (ref 135–145)

## 2017-02-17 LAB — GLUCOSE, CAPILLARY
GLUCOSE-CAPILLARY: 122 mg/dL — AB (ref 65–99)
GLUCOSE-CAPILLARY: 151 mg/dL — AB (ref 65–99)
GLUCOSE-CAPILLARY: 208 mg/dL — AB (ref 65–99)
Glucose-Capillary: 116 mg/dL — ABNORMAL HIGH (ref 65–99)
Glucose-Capillary: 153 mg/dL — ABNORMAL HIGH (ref 65–99)

## 2017-02-17 LAB — HEPARIN LEVEL (UNFRACTIONATED): Heparin Unfractionated: 0.46 IU/mL (ref 0.30–0.70)

## 2017-02-17 MED ORDER — LORAZEPAM 2 MG/ML PO CONC
0.2500 mg | Freq: Two times a day (BID) | ORAL | Status: DC
  Administered 2017-02-17 (×2): 0.26 mg via ORAL
  Filled 2017-02-17 (×2): qty 1

## 2017-02-17 MED ORDER — POTASSIUM CHLORIDE CRYS ER 20 MEQ PO TBCR
20.0000 meq | EXTENDED_RELEASE_TABLET | Freq: Once | ORAL | Status: AC
  Administered 2017-02-17: 20 meq via ORAL
  Filled 2017-02-17: qty 1

## 2017-02-17 MED ORDER — OXYCODONE HCL 5 MG/5ML PO SOLN: 5.0000 mg | ORAL | Status: DC | PRN

## 2017-02-17 MED ORDER — LORAZEPAM 2 MG/ML IJ SOLN: 0.2500 mg | Freq: Four times a day (QID) | INTRAMUSCULAR | Status: DC | PRN

## 2017-02-17 MED ORDER — HYDRALAZINE HCL 20 MG/ML IJ SOLN
5.0000 mg | Freq: Once | INTRAMUSCULAR | Status: AC
  Administered 2017-02-17: 5 mg via INTRAVENOUS
  Filled 2017-02-17: qty 1

## 2017-02-17 MED ORDER — POLYETHYLENE GLYCOL 3350 17 G PO PACK
17.0000 g | PACK | Freq: Every day | ORAL | Status: DC
  Administered 2017-02-17 – 2017-02-20 (×3): 17 g via ORAL
  Filled 2017-02-17 (×3): qty 1

## 2017-02-17 NOTE — Progress Notes (Addendum)
Patient and family requesting to speak with cardiologist, they state that no one came to see pt yesterday. They would like an update on the plan for the future. They would also like to see palliative care and a dietician as soon as possible. RN has paged Dr. Irish Lack through [facility] as Dr. Claiborne Billings is not on service today. Waiting for reply.   Cardiology visited with family and has updated and answered their questions.

## 2017-02-17 NOTE — Progress Notes (Signed)
ANTICOAGULATION CONSULT NOTE - Follow Up Consult  Pharmacy Consult for heparin Indication: chest pain/ACS  Labs:  Recent Labs  02/15/17 0529 02/15/17 1201 02/15/17 1912  02/16/17 0325 02/16/17 0752 02/16/17 1808 02/17/17 0507  HGB 9.8*  --   --   --  9.5*  --   --  9.5*  HCT 30.1*  --   --   --  29.4*  --   --  28.5*  PLT 380  --   --   --  370  --   --  358  HEPARINUNFRC  --   --  1.60*  < >  --  0.13* 0.44 0.46  CREATININE 3.79*  --   --   --  3.51*  --   --  3.64*  TROPONINI 0.08* 0.06* 0.04*  --   --   --   --   --   < > = values in this interval not displayed.  Assessment: 71yo female continues on heparin for ACS. HL remains therapeutic at 1350 units/hr. H/H low stable, Plt wnl   No bleeding noted per RN.   Goal of Therapy:  Heparin level 0.3-0.7 units/ml   Plan:  Continue heparin at 1350 units/h Daily heparin level and CBC Follow for s/s bleeding    Albertina Parr, PharmD., BCPS Clinical Pharmacist Pager [phone]

## 2017-02-17 NOTE — Progress Notes (Signed)
Progress Note  Patient Name: Alyssa [name] Date of Encounter: 02/17/2017  Primary Cardiologist: Dr. Shelva Majestic  Subjective   No recurrent angina or dyspnea. Has walk some with nursing in the unit this morning.  Inpatient Medications    Scheduled Meds: . aspirin  325 mg Oral Daily  . carvedilol  6.25 mg Oral BID WC  . furosemide  80 mg Oral BID  . hydrALAZINE  25 mg Oral Q8H  . insulin aspart  0-15 Units Subcutaneous TID WC  . insulin aspart  0-5 Units Subcutaneous QHS  . insulin glargine  5 Units Subcutaneous QHS  . latanoprost  1 drop Both Eyes QPM  . levothyroxine  75 mcg Oral QAC breakfast  . pantoprazole  40 mg Oral Q1200  . timolol  1 drop Both Eyes BID   Continuous Infusions: . heparin 1,350 Units/hr (02/17/17 0800)  . nitroGLYCERIN Stopped (02/15/17 2123)  . piperacillin-tazobactam (ZOSYN)  IV Stopped (02/17/17 0601)   PRN Meds: acetaminophen, diphenhydrAMINE, morphine injection, ondansetron **OR** ondansetron (ZOFRAN) IV, pentafluoroprop-tetrafluoroeth, sodium phosphate, traMADol   Vital Signs    Vitals:   02/17/17 0607 02/17/17 0700 02/17/17 0750 02/17/17 0800  BP: (!) 185/71 (!) 160/70    Pulse:  (!) 54  90  Resp:  12  18  Temp:   (!) 97.5 F (36.4 C)   TempSrc:   Axillary   SpO2:  99%  97%  Weight:      Height:        Intake/Output Summary (Last 24 hours) at 02/17/17 1023 Last data filed at 02/17/17 0800  Gross per 24 hour  Intake              447 ml  Output             2500 ml  Net            -2053 ml   Filed Weights   02/15/17 0500 02/16/17 0500 02/17/17 0500  Weight: 193 lb 12.6 oz (87.9 kg) 188 lb 11.4 oz (85.6 kg) 184 lb 15.5 oz (83.9 kg)    Telemetry    Sinus rhythm. Personally reviewed.  ECG    Tracing from 02/17/2017 shows sinus rhythm with deep anterolateral T wave inversions, prolonged QT. Personally reviewed.  Physical Exam   GEN: No acute distress.   Neck: No JVD. Cardiac: RRR, no murmur or gallop.  Respiratory:  Nonlabored. Clear to auscultation bilaterally. GI: Soft, bowel sounds present. MS: No pitting edema; No deformity. Neuro:  Nonfocal. Psych: Alert and oriented x 3. Normal affect.  Labs    Chemistry Recent Labs Lab 02/15/17 0529 02/16/17 0325 02/17/17 0507  NA 138 134* 134*  K 3.3* 3.3* 3.2*  CL 107 103 101  CO2 20* 21* 23  GLUCOSE 168* 110* 108*  BUN 66* 61* 59*  CREATININE 3.79* 3.51* 3.64*  CALCIUM 10.2 9.8 9.8  PROT 5.9*  --   --   ALBUMIN 2.6*  --   --   AST 24  --   --   ALT 33  --   --   ALKPHOS 239*  --   --   BILITOT 0.9  --   --   GFRNONAA 11* 12* 12*  GFRAA 13* 14* 13*  ANIONGAP 11 10 10      Hematology Recent Labs Lab 02/15/17 0529 02/16/17 0325 02/17/17 0507  WBC 12.3* 10.0 10.5  RBC 3.54* 3.44* 3.37*  HGB 9.8* 9.5* 9.5*  HCT 30.1* 29.4* 28.5*  MCV  85.0 85.5 84.6  MCH 27.7 27.6 28.2  MCHC 32.6 32.3 33.3  RDW 14.8 14.8 14.8  PLT 380 370 358    Cardiac Enzymes Recent Labs Lab 02/15/17 0529 02/15/17 1201 02/15/17 1912  TROPONINI 0.08* 0.06* 0.04*   No results for input(s): TROPIPOC in the last 168 hours.    Radiology    Ct Abdomen Pelvis Wo Contrast  Result Date: 02/15/2017 CLINICAL DATA:  Possible free-air on radiograph EXAM: CT ABDOMEN AND PELVIS WITHOUT CONTRAST TECHNIQUE: Multidetector CT imaging of the abdomen and pelvis was performed following the standard protocol without IV contrast. COMPARISON:  Radiograph 02/15/2017, CT 02/09/2017 FINDINGS: Lower chest: Small left pleural effusion and trace right pleural effusion. Patchy consolidation in the left greater than right lung bases, likely atelectasis. Cardiomegaly with coronary artery calcification. Small pericardial effusion. Hepatobiliary: No focal hepatic abnormality. Dilated gallbladder, measuring up to 4.2 cm. Punctate stones present in the gallbladder. No biliary dilatation. Pancreas: Unremarkable. No pancreatic ductal dilatation or surrounding inflammatory changes. Spleen: Normal in  size without focal abnormality. Adrenals/Urinary Tract: Adrenal glands are within normal limits. No hydronephrosis. Probable intrarenal vascular calcifications. Bladder unremarkable. Stomach/Bowel: The stomach is nonenlarged. No dilated small bowel. Sigmoid colon diverticular disease with wall thickening and surrounding inflammation consistent with diverticulitis. Increased size of a focal gas collection posterior to the uterus, measuring 4.6 by 1.8 cm. Interim development of gas and fluid collection anterior to the uterus measuring 4.2 by 4.2 cm, suspicious for an abscess between small bowel loops. The appendix is visualized and appears within normal limits. Vascular/Lymphatic: Aortic atherosclerosis. Scattered retroperitoneal lymph nodes. Reproductive: Large calcified masses in the uterus consistent with fibroids. No adnexal masses. Other: Small amount of free fluid in the right upper quadrant. Multiple pockets of free air in the upper abdomen, dominant air collection in the anterior abdomen. Musculoskeletal: Postsurgical changes at L4-L5 with anterolisthesis of L4 on L5. IMPRESSION: 1. Pneumoperitoneum, consistent with hollow viscus perforation. Likely source is the rectosigmoid colon where there are findings consistent with diverticulitis. Increased gas and small fluid collection posterior to the uterus, now measuring 4.6 cm suspicious for focal perforation or possible fistula. Interim development of gas and fluid collection anterior to the uterine fundus measuring 4.2 cm, suspicious for an abscess. 2. Negative for bowel obstruction 3. Dilated gallbladder containing small calcified stones 4. Cardiomegaly.  Trace pleural effusions and bibasilar atelectasis. Critical Value/emergent results were called by telephone at the time of interpretation on 02/15/2017 at 2:33 pm to Dr. Bonnielee Haff, who verbally acknowledged these results. Electronically Signed   By: Donavan Foil M.D.   On: 02/15/2017 14:34    Cardiac  Studies   Echocardiogram 02/15/2017: Study Conclusions  - Left ventricle: The cavity size was normal. Wall thickness was   increased in a pattern of moderate LVH. Systolic function was   normal. The estimated ejection fraction was in the range of 55%   to 60%. Doppler parameters are consistent with abnormal left   ventricular relaxation (grade 1 diastolic dysfunction). The E/e&'   ratio is >20, suggesting markedly elevated LV filling pressure. - Left atrium: The atrium was normal in size. - Inferior vena cava: The vessel was normal in size. The   respirophasic diameter changes were in the normal range (= 50%),   consistent with normal central venous pressure. - Pericardium, extracardiac: A trivial pericardial effusion was   identified posterior to the heart.  Patient Profile     71 y.o. female with a history of hypertension, hyperlipidemia, type 2  diabetes mellitus, CKD stage 4-5, chronic diastolic heart failure, currently admitted with acute acute diverticulitis associated with perforation and intra-abdominal abscess. She has manifested unstable angina symptoms with equivocal elevation of troponin I peaking at 0.08, ECG abnormalities suggesting LAD distribution ischemia. She was in consultation by Dr. Claiborne Billings with plan for medical therapy from a cardiac perspective at this time.  Assessment & Plan    1. Unstable angina with significant ECG abnormalities suggesting LAD distribution ischemia. No active angina symptoms in the last 24 hours. Peak troponin I 0.08. Cardiac catheterization is not being pursued in light of very high risk of contrast nephropathy. She is currently on aspirin, Coreg, hydralazine, Lasix, and heparin GTT. Off NTG GTT. LVEF 55-60% by echocardiogram.  2. 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  3. Essential hypertension, recent systolic blood pressures 700F to 160s. She is on Coreg and hydralazine.  4. History of hyperlipidemia with reported statin allergy.  5. Acute  diverticulitis associated with perforation of intra-abdominal abscess. She continues on broad-spectrum antibiotics. She has been seen by the surgical team and has declined surgery at this point. Palliative care consultation pending as well.  Discussed situation with patient, her daughter, and her son-in-law. As noted above she has declined abdominal surgery. Plans to move out of the unit. From a cardiac perspective, fortunately she is not reporting recurring angina symptoms. Continue heparin for 72 hour course. Start Imdur 30 mg daily to go along with hydralazine. Otherwise continue aspirin and Coreg. Consider adding Plavix next once she is off heparin in the absence of other anticipated invasive procedures.  Signed, Rozann Lesches, MD  02/17/2017, 10:23 AM

## 2017-02-17 NOTE — Consult Note (Signed)
Consultation Note Date: 02/17/2017   Patient Name: Alyssa [name]  DOB: 10-22-1945  MRN: [id number]  Age / Sex: 71 y.o., female  PCP: Tisovec, Fransico Him, MD Referring Physician: Bonnielee Haff, MD  Reason for Consultation: Establishing goals of care  HPI/Patient Profile: 71 y.o. female  with past medical history of chronic back pain, chronic kidney disease, and congestive heart failure who was admitted on 02/09/2017 with diverticulitis with suspected microperforation.  Additionally she had elevated troponin and acute on chronic renal failure.  She was seen by surgery who offered surgery.  She was seen by cardiology who felt she likely had some type of ischemic injury but felt that due to her kidney disease she was not a candidate for a cardiac cath.  Further Cardiology state that she was at high risk for a perioperative cardiac event.  Shortly before I met the patient she made up her mind not to have surgery.   Clinical Assessment and Goals of Care:  I have reviewed medical records including EPIC notes, labs and imaging, received report from the attending physician and the general surgeon, assessed the patient and then met at the bedside along with her dtr and son in law to discuss diagnosis prognosis, [place], EOL wishes, disposition and options.  I introduced Palliative Medicine as specialized medical care for people living with serious illness. It focuses on providing relief from the symptoms and stress of a serious illness. The goal is to improve quality of life for both the patient and the family.  We discussed a brief life review of the patient. She is the mother of 1 dtr.  She just lost her husband 6 months ago after a long battle with heart disease.  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  Her pride and joy is her grandson who is currently in school at [employer] and a Freight forwarder for  the football team.    As far as functional and nutritional status she was living independently and was being checked on by her dtr and son in law.  Currently she is able to walk with a walker and take clears PO. She has been depressed since her husband died and tells me "I'll never be happy without him".   We discussed her current illness (intestinal perforation and advanced kidney disease) and what it means in the larger context of their on-going co-morbidities.  On one hand Alyssa [name] understands that she has a terminal condition and she wants to be comfortable above all else.  On the other hand she is very medically compliant (very concerned about her diet, insulin and CBGs), and wants to live as long as possible without surgery or hemo-dialysis.  The difference between aggressive medical intervention and comfort care was considered in light of the patient's goals of care.  At this point the patients goals are not quite clear.  We will re-visit tomorrow.  Hospice and Palliative Care services outpatient were explained and offered.  She is open to  hospice outpatient - but further clarification of her goals is necessary in order for her to be hospice eligible.  Primary Decision Maker:  PATIENT    SUMMARY OF RECOMMENDATIONS     The patient's top priority is comfort and quality of life   No surgery.   No hemo-dialysis   Nutrition consult to advise about best possible diet given perforation, CKD, DM, heart disease   Would like to live as long as possible as well as possible given her circumstances.  I would like to confer with her physicians regarding recommendations for IV antibiotics  Patient is open to hospice services at discharge - if they are appropriate.  Hospice services are not available if the patient is on IV antibiotics.  Further clarification of her goals is needed.   Code Status/Advance Care Planning:  DNR   Symptom Management:   Ativan 0.25 mg bid scheduled.   Ativan 0.25 mg PRN as well  Will try low dose oxycodone solution for pain (progressing from IV morphine)  Miralax daily.  Prognosis:   Despite her current stable appearance (ambulating, taking POs) - Likely only days to weeks given perforation, and advanced kidney disease  Discharge Planning: To Be Determined  Home with PICC line, IV antibiotics and prompt outpatient follow up vs Home with hospice services and oral antibiotics.      Primary Diagnoses: Present on Admission: . Anemia . Bipolar 1 disorder ([place]) . CKD (chronic kidney disease) stage 3, GFR 30-59 ml/min . Depression, major, in partial remission ([place]) . Hypertension . Hypothyroidism . Morbid obesity (BMI 39.21)  . Diverticulitis   I have reviewed the medical record, interviewed the patient and family, and examined the patient. The following aspects are pertinent.  Past Medical History:  Diagnosis Date  . Anemia   . Bipolar 1 disorder ([place])   . Cataract   . Chronic back pain   . Diabetes mellitus without complication ([place])   . Glaucoma   . Hyperlipidemia   . Hypertension   . Hypothyroidism   . Osteoporosis    Social History   Social History  . Marital status: Married    Spouse name: N/A  . Number of children: N/A  . Years of education: N/A   Occupational History  .  Retired   Social History Main Topics  . Smoking status: Never Smoker  . Smokeless tobacco: Never Used  . Alcohol use Yes     Comment: social drinker- glass of wine or cocktail  . Drug use: No  . Sexual activity: No   Other Topics Concern  . None   Social History Narrative  . None   Family History  Problem Relation Age of Onset  . Adopted: Yes   Scheduled Meds: . aspirin  325 mg Oral Daily  . carvedilol  6.25 mg Oral BID WC  . furosemide  80 mg Oral BID  . hydrALAZINE  25 mg Oral Q8H  . insulin aspart  0-15 Units Subcutaneous TID WC  . insulin aspart  0-5 Units Subcutaneous QHS  . insulin glargine  5 Units Subcutaneous  QHS  . latanoprost  1 drop Both Eyes QPM  . levothyroxine  75 mcg Oral QAC breakfast  . LORazepam  0.26 mg Oral BID  . pantoprazole  40 mg Oral Q1200  . timolol  1 drop Both Eyes BID   Continuous Infusions: . heparin 1,350 Units/hr (02/17/17 1200)  . nitroGLYCERIN Stopped (02/15/17 2123)  . piperacillin-tazobactam (ZOSYN)  IV 2.25 g (02/17/17 1351)  PRN Meds:.acetaminophen, diphenhydrAMINE, LORazepam, morphine injection, ondansetron **OR** ondansetron (ZOFRAN) IV, pentafluoroprop-tetrafluoroeth, sodium phosphate, traMADol Allergies  Allergen Reactions  . Ciprofloxacin Hcl Diarrhea  . Cymbalta [Duloxetine Hcl] Other (See Comments)    "Ineffective"  . Lipitor [Atorvastatin] Other (See Comments)    Makes the patient "not feel right"  . Xanax [Alprazolam] Other (See Comments)    Excessive sleepiness  . Codeine     Reaction not recalled  . Lortab [Hydrocodone-Acetaminophen] Other (See Comments)    Reaction not recalled  . Oxycodone Other (See Comments)    Reaction not recalled    Review of Systems abdomen no longer painful, no chest pain, no sob  Physical Exam  Well developed female, A&O, NAD CV rrr no m/r/g resp no distress, on n/c Abdomen soft, non tender, +bs Ext able to move all 4, no edema.  Vital Signs: BP 139/62   Pulse 90   Temp 97.8 F (36.6 C) (Oral)   Resp 13   Ht 5' 3" (1.6 m)   Wt 83.9 kg (184 lb 15.5 oz)   SpO2 97%   BMI 32.77 kg/m  Pain Assessment: 0-10   Pain Score: 3    SpO2: SpO2: 97 % O2 Device:SpO2: 97 % O2 Flow Rate: .   IO: Intake/output summary:  Intake/Output Summary (Last 24 hours) at 02/17/17 1359 Last data filed at 02/17/17 1200  Gross per 24 hour  Intake              474 ml  Output             2500 ml  Net            -2026 ml    LBM: Last BM Date: 02/15/17 Baseline Weight: Weight: 79.4 kg (175 lb) Most recent weight: Weight: 83.9 kg (184 lb 15.5 oz)     Palliative Assessment/Data:     Time In: 12:00 Time Out:  2:00 Time Total: 120 min. Greater than 50%  of this time was spent counseling and coordinating care related to the above assessment and plan.  Signed by: Florentina Jenny, PA-C Palliative Medicine Pager: [phone]  Please contact Palliative Medicine Team phone at [phone] for questions and concerns.  For individual provider: See Shea Evans

## 2017-02-17 NOTE — Progress Notes (Addendum)
[facility] Progress Note    Subjective:' Sleeping comfortably. Complains of leg pain this AM. Chest and abdomen ok. She thinks she will decide against surgery.   Objective: Vital signs in last 24 hours: Temp:  [97.5 F (36.4 C)-98.4 F (36.9 C)] 97.8 F (36.6 C) (07/29 0357) Pulse Rate:  [51-65] 54 (07/29 0700) Resp:  [12-26] 12 (07/29 0700) BP: (132-185)/(58-86) 160/70 (07/29 0700) SpO2:  [82 %-100 %] 99 % (07/29 0700) Weight:  [83.9 kg (184 lb 15.5 oz)] 83.9 kg (184 lb 15.5 oz) (07/29 0500) Last BM Date: 02/15/17  Intake/Output from previous day: 07/28 0701 - 07/29 0700 In: 477.5 [I.V.:327.5; IV Piggyback:150] Out: 2500 [Urine:2500] Intake/Output this shift: No intake/output data recorded.  General: No acute distress  Lungs: clear to auscultation  Abd: Soft.  No tenderness  Extremities: No changes  Neuro: Intact.  Completely alert and oriented.  Lab Results:  CBC    Component Value Date/Time   WBC 10.5 02/17/2017 0507   RBC 3.37 (L) 02/17/2017 0507   HGB 9.5 (L) 02/17/2017 0507   HCT 28.5 (L) 02/17/2017 0507   PLT 358 02/17/2017 0507   MCV 84.6 02/17/2017 0507   MCH 28.2 02/17/2017 0507   MCHC 33.3 02/17/2017 0507   RDW 14.8 02/17/2017 0507   LYMPHSABS 1.0 02/12/2017 0539   MONOABS 0.4 02/12/2017 0539   EOSABS 0.1 02/12/2017 0539   BASOSABS 0.0 02/12/2017 0539    )  Recent Labs  02/16/17 0325 02/17/17 0507  NA 134* 134*  K 3.3* 3.2*  CL 103 101  CO2 21* 23  GLUCOSE 110* 108*  BUN 61* 59*  CREATININE 3.51* 3.64*  CALCIUM 9.8 9.8   PT/INR No results for input(s): LABPROT, INR in the last 72 hours. ABG No results for input(s): PHART, HCO3 in the last 72 hours.  Invalid input(s): PCO2, PO2  Studies/Results: Ct Abdomen Pelvis Wo Contrast  Result Date: 02/15/2017 CLINICAL DATA:  Possible free-air on radiograph EXAM: CT ABDOMEN AND PELVIS WITHOUT CONTRAST TECHNIQUE: Multidetector CT imaging of the abdomen and pelvis was performed following the  standard protocol without IV contrast. COMPARISON:  Radiograph 02/15/2017, CT 02/09/2017 FINDINGS: Lower chest: Small left pleural effusion and trace right pleural effusion. Patchy consolidation in the left greater than right lung bases, likely atelectasis. Cardiomegaly with coronary artery calcification. Small pericardial effusion. Hepatobiliary: No focal hepatic abnormality. Dilated gallbladder, measuring up to 4.2 cm. Punctate stones present in the gallbladder. No biliary dilatation. Pancreas: Unremarkable. No pancreatic ductal dilatation or surrounding inflammatory changes. Spleen: Normal in size without focal abnormality. Adrenals/Urinary Tract: Adrenal glands are within normal limits. No hydronephrosis. Probable intrarenal vascular calcifications. Bladder unremarkable. Stomach/Bowel: The stomach is nonenlarged. No dilated small bowel. Sigmoid colon diverticular disease with wall thickening and surrounding inflammation consistent with diverticulitis. Increased size of a focal gas collection posterior to the uterus, measuring 4.6 by 1.8 cm. Interim development of gas and fluid collection anterior to the uterus measuring 4.2 by 4.2 cm, suspicious for an abscess between small bowel loops. The appendix is visualized and appears within normal limits. Vascular/Lymphatic: Aortic atherosclerosis. Scattered retroperitoneal lymph nodes. Reproductive: Large calcified masses in the uterus consistent with fibroids. No adnexal masses. Other: Small amount of free fluid in the right upper quadrant. Multiple pockets of free air in the upper abdomen, dominant air collection in the anterior abdomen. Musculoskeletal: Postsurgical changes at L4-L5 with anterolisthesis of L4 on L5. IMPRESSION: 1. Pneumoperitoneum, consistent with hollow viscus perforation. Likely source is the rectosigmoid colon where there are findings  consistent with diverticulitis. Increased gas and small fluid collection posterior to the uterus, now measuring  4.6 cm suspicious for focal perforation or possible fistula. Interim development of gas and fluid collection anterior to the uterine fundus measuring 4.2 cm, suspicious for an abscess. 2. Negative for bowel obstruction 3. Dilated gallbladder containing small calcified stones 4. Cardiomegaly.  Trace pleural effusions and bibasilar atelectasis. Critical Value/emergent results were called by telephone at the time of interpretation on 02/15/2017 at 2:33 pm to Dr. Bonnielee Haff, who verbally acknowledged these results. Electronically Signed   By: Donavan Foil M.D.   On: 02/15/2017 14:34   Dg Chest 2 View  Result Date: 02/15/2017 CLINICAL DATA:  Shortness of breath.  Chest pain . EXAM: CHEST  2 VIEW COMPARISON:  02/09/2017 . FINDINGS: Cardiomegaly. No evidence of overt pulmonary edema. Mild basilar Basilar atelectasis. Small left pleural effusion cannot be excluded. Free air under the left hemidiaphragm versus gastric distention cannot be excluded. Abdominal series suggested for further evaluation . IMPRESSION: 1. Free air under the left hemidiaphragm versus gastric distention cannot be excluded. Abdominal series suggest for further evaluation. 2. Cardiomegaly. No evidence of overt congestive heart failure . Mild left base atelectasis. Small left pleural effusion. Critical Value/emergent results were called by telephone at the time of interpretation on 02/15/2017 at 8:54 am to nurse Arbie Cookey, who verbally acknowledged these results. Electronically Signed   By: Marcello Moores  Register   On: 02/15/2017 08:57    Anti-infectives: Anti-infectives    Start     Dose/Rate Route Frequency Ordered Stop   02/15/17 1745  piperacillin-tazobactam (ZOSYN) IVPB 2.25 g     2.25 g 100 mL/hr over 30 Minutes Intravenous Every 8 hours 02/15/17 1707     02/13/17 1100  amoxicillin-clavulanate (AUGMENTIN) 875-125 MG per tablet 1 tablet  Status:  Discontinued     1 tablet Oral Every 12 hours 02/13/17 1046 02/13/17 1049   02/13/17 1100   amoxicillin-clavulanate (AUGMENTIN) 500-125 MG per tablet 500 mg  Status:  Discontinued     1 tablet Oral Every 24 hours 02/13/17 1050 02/15/17 1648   02/09/17 2300  piperacillin-tazobactam (ZOSYN) IVPB 2.25 g  Status:  Discontinued     2.25 g 100 mL/hr over 30 Minutes Intravenous Every 8 hours 02/09/17 1454 02/13/17 1046   02/09/17 1400  piperacillin-tazobactam (ZOSYN) IVPB 3.375 g     3.375 g 100 mL/hr over 30 Minutes Intravenous  Once 02/09/17 1355 02/09/17 1512      Assessment/Plan: Perforated diverticulitis CHF CKD  Patient is to decide if she would like to have surgery, and likely will not pursue surgery..  Family is in agreement with whatever the patient wants to do.    Recommend palliative care consult- I have asked them to see her. From surgery standpoint she does not require ICU monitoring- defer to primary team/cardiology.   LOS: 8 days   Clovis Riley MD 02/17/2017

## 2017-02-17 NOTE — Progress Notes (Signed)
[facility] PROGRESS NOTE  Alyssa [name] ZJI:[id number] DOB: 04-Jan-1946 DOA: 02/09/2017  PCP: Haywood Pao, MD  Brief History/Interval Summary: 71 year old female with a past medical history of bipolar disorder, type 2 diabetes, chronic back pain, chronic kidney disease stage III, presented with abdominal pain. Was found to have diverticulitis with microperforation. She started improving. On the morning of 7/27 she complained of chest pain. EKG showed new changes. Cardiology was consulted. CT scan of the abdomen was repeated and showed worsening findings. Patient reevaluated by general surgery and they offered her surgical intervention, which she has declined so far. Subsequently palliative medicine was consulted by general surgery.  Reason for Visit: Acute diverticulitis  Consultants: Gen. surgery. Cardiology. Palliative medicine  Procedures:   Transthoracic echocardiogram Study Conclusions - Left ventricle: The cavity size was normal. Wall thickness was   increased in a pattern of moderate LVH. Systolic function was   normal. The estimated ejection fraction was in the range of 55%   to 60%. Doppler parameters are consistent with abnormal left   ventricular relaxation (grade 1 diastolic dysfunction). The E/e&'   ratio is >20, suggesting markedly elevated LV filling pressure. - Left atrium: The atrium was normal in size. - Inferior vena cava: The vessel was normal in size. The   respirophasic diameter changes were in the normal range (= 50%),   consistent with normal central venous pressure. - Pericardium, extracardiac: A trivial pericardial effusion was   identified posterior to the heart. Impressions:  - Compared to a prior study in 12/2016, the LVEF is higher at   55-60%. There is grade 1 DD with high LV filling pressure. A   trivial posterior pericardial effusion was noted.  Antibiotics: Was on Zosyn. Changed over to Augmentin 7/25. Placed back on Zosyn. On  7/27  Subjective/Interval History: Patient feels well this morning. Denies any chest pain, shortness of breath. Denies any abdominal pain per se. No nausea, vomiting.   ROS: Denies any headaches.  Objective:  Vital Signs  Vitals:   02/17/17 0531 02/17/17 0607 02/17/17 0700 02/17/17 0750  BP: (!) 132/58 (!) 185/71 (!) 160/70   Pulse:   (!) 54   Resp:   12   Temp:    (!) 97.5 F (36.4 C)  TempSrc:    Axillary  SpO2:   99%   Weight:      Height:        Intake/Output Summary (Last 24 hours) at 02/17/17 0752 Last data filed at 02/17/17 0700  Gross per 24 hour  Intake            477.5 ml  Output             2500 ml  Net          -2022.5 ml   Filed Weights   02/15/17 0500 02/16/17 0500 02/17/17 0500  Weight: 87.9 kg (193 lb 12.6 oz) 85.6 kg (188 lb 11.4 oz) 83.9 kg (184 lb 15.5 oz)    General appearance: Awake, alert. In no distress Resp: Clear to auscultation bilaterally. No wheezing, rales or rhonchi Cardio: S1, S2 is normal, regular. No S3, S4. No rubs, murmurs, or bruit GI: Abdomen is soft. Nontender. For the most part. Nondistended. Bowel sounds are present but sluggish. No masses or organomegaly Extremities: No edema Neurologic: No focal deficits  Lab Results:  Data Reviewed: I have personally reviewed following labs and imaging studies  CBC:  Recent Labs Lab 02/11/17 0527 02/12/17 0539 02/13/17 0513 02/14/17  7035 02/15/17 0529 02/16/17 0325 02/17/17 0507  WBC 13.6* 12.1* 8.0 7.8 12.3* 10.0 10.5  NEUTROABS 12.2* 10.6*  --   --   --   --   --   HGB 8.6* 8.9* 8.7* 9.4* 9.8* 9.5* 9.5*  HCT 26.2* 28.1* 27.4* 28.3* 30.1* 29.4* 28.5*  MCV 86.5 87.5 86.2 86.8 85.0 85.5 84.6  PLT 303 337 318 333 380 370 009    Basic Metabolic Panel:  Recent Labs Lab 02/12/17 0539 02/14/17 0513 02/15/17 0529 02/16/17 0325 02/17/17 0507  NA 139 138 138 134* 134*  K 3.5 3.4* 3.3* 3.3* 3.2*  CL 105 108 107 103 101  CO2 21* 20* 20* 21* 23  GLUCOSE 106* 130* 168* 110*  108*  BUN 83* 71* 66* 61* 59*  CREATININE 4.65* 4.10* 3.79* 3.51* 3.64*  CALCIUM 9.8 10.0 10.2 9.8 9.8    GFR: Estimated Creatinine Clearance: 14.3 mL/min (A) (by C-G formula based on SCr of 3.64 mg/dL (H)).  Liver Function Tests:  Recent Labs Lab 02/15/17 0529  AST 24  ALT 33  ALKPHOS 239*  BILITOT 0.9  PROT 5.9*  ALBUMIN 2.6*    CBG:  Recent Labs Lab 02/15/17 2110 02/16/17 0739 02/16/17 1121 02/16/17 1652 02/16/17 2132  GLUCAP 128* 111* 111* 110* 141*     Recent Results (from the past 240 hour(s))  Culture, blood (Routine X 2) w Reflex to ID Panel     Status: None   Collection Time: 02/09/17  3:40 PM  Result Value Ref Range Status   Specimen Description BLOOD RIGHT ANTECUBITAL  Final   Special Requests   Final    BOTTLES DRAWN AEROBIC AND ANAEROBIC Blood Culture adequate volume   Culture NO GROWTH 5 DAYS  Final   Report Status 02/14/2017 FINAL  Final  Culture, blood (Routine X 2) w Reflex to ID Panel     Status: None   Collection Time: 02/09/17  6:35 PM  Result Value Ref Range Status   Specimen Description BLOOD RIGHT HAND  Final   Special Requests   Final    BOTTLES DRAWN AEROBIC AND ANAEROBIC Blood Culture results may not be optimal due to an inadequate volume of blood received in culture bottles   Culture NO GROWTH 5 DAYS  Final   Report Status 02/14/2017 FINAL  Final  MRSA PCR Screening     Status: None   Collection Time: 02/15/17  2:25 PM  Result Value Ref Range Status   MRSA by PCR NEGATIVE NEGATIVE Final    Comment:        The GeneXpert MRSA Assay (FDA approved for NASAL specimens only), is one component of a comprehensive MRSA colonization surveillance program. It is not intended to diagnose MRSA infection nor to guide or monitor treatment for MRSA infections.       Radiology Studies: Ct Abdomen Pelvis Wo Contrast  Result Date: 02/15/2017 CLINICAL DATA:  Possible free-air on radiograph EXAM: CT ABDOMEN AND PELVIS WITHOUT CONTRAST  TECHNIQUE: Multidetector CT imaging of the abdomen and pelvis was performed following the standard protocol without IV contrast. COMPARISON:  Radiograph 02/15/2017, CT 02/09/2017 FINDINGS: Lower chest: Small left pleural effusion and trace right pleural effusion. Patchy consolidation in the left greater than right lung bases, likely atelectasis. Cardiomegaly with coronary artery calcification. Small pericardial effusion. Hepatobiliary: No focal hepatic abnormality. Dilated gallbladder, measuring up to 4.2 cm. Punctate stones present in the gallbladder. No biliary dilatation. Pancreas: Unremarkable. No pancreatic ductal dilatation or surrounding inflammatory changes. Spleen: Normal in  size without focal abnormality. Adrenals/Urinary Tract: Adrenal glands are within normal limits. No hydronephrosis. Probable intrarenal vascular calcifications. Bladder unremarkable. Stomach/Bowel: The stomach is nonenlarged. No dilated small bowel. Sigmoid colon diverticular disease with wall thickening and surrounding inflammation consistent with diverticulitis. Increased size of a focal gas collection posterior to the uterus, measuring 4.6 by 1.8 cm. Interim development of gas and fluid collection anterior to the uterus measuring 4.2 by 4.2 cm, suspicious for an abscess between small bowel loops. The appendix is visualized and appears within normal limits. Vascular/Lymphatic: Aortic atherosclerosis. Scattered retroperitoneal lymph nodes. Reproductive: Large calcified masses in the uterus consistent with fibroids. No adnexal masses. Other: Small amount of free fluid in the right upper quadrant. Multiple pockets of free air in the upper abdomen, dominant air collection in the anterior abdomen. Musculoskeletal: Postsurgical changes at L4-L5 with anterolisthesis of L4 on L5. IMPRESSION: 1. Pneumoperitoneum, consistent with hollow viscus perforation. Likely source is the rectosigmoid colon where there are findings consistent with  diverticulitis. Increased gas and small fluid collection posterior to the uterus, now measuring 4.6 cm suspicious for focal perforation or possible fistula. Interim development of gas and fluid collection anterior to the uterine fundus measuring 4.2 cm, suspicious for an abscess. 2. Negative for bowel obstruction 3. Dilated gallbladder containing small calcified stones 4. Cardiomegaly.  Trace pleural effusions and bibasilar atelectasis. Critical Value/emergent results were called by telephone at the time of interpretation on 02/15/2017 at 2:33 pm to Dr. Bonnielee Haff, who verbally acknowledged these results. Electronically Signed   By: Donavan Foil M.D.   On: 02/15/2017 14:34   Dg Chest 2 View  Result Date: 02/15/2017 CLINICAL DATA:  Shortness of breath.  Chest pain . EXAM: CHEST  2 VIEW COMPARISON:  02/09/2017 . FINDINGS: Cardiomegaly. No evidence of overt pulmonary edema. Mild basilar Basilar atelectasis. Small left pleural effusion cannot be excluded. Free air under the left hemidiaphragm versus gastric distention cannot be excluded. Abdominal series suggested for further evaluation . IMPRESSION: 1. Free air under the left hemidiaphragm versus gastric distention cannot be excluded. Abdominal series suggest for further evaluation. 2. Cardiomegaly. No evidence of overt congestive heart failure . Mild left base atelectasis. Small left pleural effusion. Critical Value/emergent results were called by telephone at the time of interpretation on 02/15/2017 at 8:54 am to nurse Arbie Cookey, who verbally acknowledged these results. Electronically Signed   By: Marcello Moores  Register   On: 02/15/2017 08:57     Medications:  Scheduled: . aspirin  325 mg Oral Daily  . carvedilol  6.25 mg Oral BID WC  . furosemide  80 mg Oral BID  . hydrALAZINE  25 mg Oral Q8H  . insulin aspart  0-15 Units Subcutaneous TID WC  . insulin aspart  0-5 Units Subcutaneous QHS  . insulin glargine  5 Units Subcutaneous QHS  . latanoprost  1 drop  Both Eyes QPM  . levothyroxine  75 mcg Oral QAC breakfast  . pantoprazole  40 mg Oral Q1200  . potassium chloride  20 mEq Oral Once  . timolol  1 drop Both Eyes BID   Continuous: . heparin 1,350 Units/hr (02/17/17 0700)  . nitroGLYCERIN Stopped (02/15/17 2123)  . piperacillin-tazobactam (ZOSYN)  IV Stopped (02/17/17 0601)   ZOX:[id number], diphenhydrAMINE, morphine injection, ondansetron **OR** ondansetron (ZOFRAN) IV, pentafluoroprop-tetrafluoroeth, sodium phosphate, traMADol  Assessment/Plan:  Principal Problem:   Diverticulitis Active Problems:   Hypertension   Bipolar 1 disorder (HCC)   Morbid obesity (BMI 39.21)    Depression, major, in partial remission ([place])  CKD (chronic kidney disease) stage 3, GFR 30-59 ml/min   Hypothyroidism   T2_NIDDM   Anemia   Congestive heart failure (CHF) (HCC)    Acute diverticulitis with perforation and intra-abdominal abscess Patient was hospitalized and started on IV antibiotics. Seen by general surgery. Patient had been improving. Her WBC had improved to normal. She was changed over to oral antibiotics. However, WBC noted to be elevated on 7/27. Free air noted on chest x-ray. CT scan was done which shows pneumoperitoneum along with concern for bowel perforation and abscess. Surgical intervention was recommended by surgery. However, patient has been refusing this intervention. She understands the risks of not undergoing surgery. She is more concerned about the possible complications and prolonged recovery after surgery. She knows about the possibility of death. If her infection were to get worse. She was changed back to Zosyn, which will be continued. Further management per general surgery. They have consulted palliative medicine to speak to the patient. If she changes her mind and does decide to undergo surgery, then she will have to be cleared by cardiology prior to that.  Chest Pain/Unstable Angina Vs NSTEMI Patient was found to have  mildly elevated troponin along with new EKG changes. Patient seen by cardiology. Echocardiogram as above. She was placed on IV heparin and was also on IV nitroglycerin for brief duration. She has stabilized. Chest pain has resolved. Chest pain has resolved. EKG with stable changes. This is a difficult situation. General surgery is recommending surgical intervention for her intra-abdominal abnormalities. She does have chronic kidney disease, any invasive testing will increase the risk of worsening renal function. Patient refusing surgery at this time. Cardiology continues to follow.  Normocytic anemia Hemoglobin is stable. No evidence for overt bleeding.  Chronic kidney disease stage III-hypokalemia Creatinine close to baseline. Patient was recently hospitalized in July for syncope thought to be due to aggressive diuresis. Her dose of Lasix was reduced to 80 twice a day. She has been off of Lasix here. Patient's weight has been increasing. She was placed back on her Lasix. She is diuresing well. Continue to monitor renal function closely. Replace potassium.  Chronic diastolic CHF. Seems euvolemic currently. Continue Lasix.  History of essential hypertension. Stable.  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 Continue to monitor CBGs. Continue Lantus. Holding her oral agents. CBGs are reasonably well controlled.  Hypothyroidism. Continue with levothyroxine  DVT Prophylaxis: SCDs    Code Status: Full code  Family Communication: Discussed with patient. No family at bedside this morning. Disposition Plan: Management as outlined above. Start mobilizing if okay with general surgery. Await palliative medicine input. Okay for transfer to telemetry.    LOS: 8 days   [facility] Pager [phone] 02/17/2017, 7:52 AM  If 7PM-7AM, please contact night-coverage at [email or web address], password [facility]

## 2017-02-18 DIAGNOSIS — R0602 Shortness of breath: Secondary | ICD-10-CM

## 2017-02-18 DIAGNOSIS — I214 Non-ST elevation (NSTEMI) myocardial infarction: Secondary | ICD-10-CM

## 2017-02-18 DIAGNOSIS — N183 Chronic kidney disease, stage 3 (moderate): Secondary | ICD-10-CM

## 2017-02-18 LAB — BASIC METABOLIC PANEL
Anion gap: 10 (ref 5–15)
BUN: 57 mg/dL — AB (ref 6–20)
CALCIUM: 9.9 mg/dL (ref 8.9–10.3)
CO2: 25 mmol/L (ref 22–32)
Chloride: 100 mmol/L — ABNORMAL LOW (ref 101–111)
Creatinine, Ser: 3.66 mg/dL — ABNORMAL HIGH (ref 0.44–1.00)
GFR calc Af Amer: 13 mL/min — ABNORMAL LOW (ref >60)
GFR, EST NON AFRICAN AMERICAN: 11 mL/min — AB (ref >60)
GLUCOSE: 126 mg/dL — AB (ref 65–99)
POTASSIUM: 3.3 mmol/L — AB (ref 3.5–5.1)
SODIUM: 135 mmol/L (ref 135–145)

## 2017-02-18 LAB — HEPARIN LEVEL (UNFRACTIONATED): HEPARIN UNFRACTIONATED: 0.3 [IU]/mL (ref 0.30–0.70)

## 2017-02-18 LAB — CBC
HCT: 29.7 % — ABNORMAL LOW (ref 36.0–46.0)
HEMOGLOBIN: 9.9 g/dL — AB (ref 12.0–15.0)
MCH: 28.3 pg (ref 26.0–34.0)
MCHC: 33.3 g/dL (ref 30.0–36.0)
MCV: 84.9 fL (ref 78.0–100.0)
PLATELETS: 417 10*3/uL — AB (ref 150–400)
RBC: 3.5 MIL/uL — AB (ref 3.87–5.11)
RDW: 14.9 % (ref 11.5–15.5)
WBC: 11.3 10*3/uL — ABNORMAL HIGH (ref 4.0–10.5)

## 2017-02-18 LAB — GLUCOSE, CAPILLARY
GLUCOSE-CAPILLARY: 175 mg/dL — AB (ref 65–99)
GLUCOSE-CAPILLARY: 145 mg/dL — AB (ref 65–99)
Glucose-Capillary: 121 mg/dL — ABNORMAL HIGH (ref 65–99)
Glucose-Capillary: 152 mg/dL — ABNORMAL HIGH (ref 65–99)

## 2017-02-18 MED ORDER — BIOTENE DRY MOUTH MT LIQD
15.0000 mL | OROMUCOSAL | Status: DC | PRN
Start: 2017-02-18 — End: 2017-02-22

## 2017-02-18 MED ORDER — POLYVINYL ALCOHOL 1.4 % OP SOLN: 1.0000 [drp] | Freq: Four times a day (QID) | OPHTHALMIC | Status: DC | PRN

## 2017-02-18 MED ORDER — HYDRALAZINE HCL 50 MG PO TABS
50.0000 mg | ORAL_TABLET | Freq: Three times a day (TID) | ORAL | Status: DC
  Administered 2017-02-18 – 2017-02-22 (×10): 50 mg via ORAL
  Filled 2017-02-18 (×10): qty 1

## 2017-02-18 MED ORDER — PRO-STAT SUGAR FREE PO LIQD
30.0000 mL | Freq: Two times a day (BID) | ORAL | Status: DC
  Administered 2017-02-21: 30 mL via ORAL
  Filled 2017-02-18 (×3): qty 30

## 2017-02-18 MED ORDER — FENTANYL 12 MCG/HR TD PT72
12.5000 ug | MEDICATED_PATCH | TRANSDERMAL | Status: DC
  Administered 2017-02-18: 12.5 ug via TRANSDERMAL
  Filled 2017-02-18: qty 1

## 2017-02-18 MED ORDER — GLYCOPYRROLATE 0.2 MG/ML IJ SOLN: 0.2000 mg | INTRAMUSCULAR | Status: DC | PRN

## 2017-02-18 MED ORDER — AMOXICILLIN-POT CLAVULANATE 500-125 MG PO TABS
1.0000 | ORAL_TABLET | Freq: Two times a day (BID) | ORAL | Status: DC
  Administered 2017-02-18 – 2017-02-22 (×8): 500 mg via ORAL
  Filled 2017-02-18 (×8): qty 1

## 2017-02-18 MED ORDER — BOOST / RESOURCE BREEZE PO LIQD
1.0000 | Freq: Two times a day (BID) | ORAL | Status: DC
  Administered 2017-02-19 (×2): 1 via ORAL

## 2017-02-18 MED ORDER — HALOPERIDOL LACTATE 5 MG/ML IJ SOLN: 0.5000 mg | INTRAMUSCULAR | Status: DC | PRN

## 2017-02-18 MED ORDER — HALOPERIDOL LACTATE 2 MG/ML PO CONC: 0.5000 mg | ORAL | Status: DC | PRN

## 2017-02-18 MED ORDER — LORAZEPAM 2 MG/ML PO CONC
0.2500 mg | Freq: Every day | ORAL | Status: DC
  Administered 2017-02-18 – 2017-02-20 (×3): 0.26 mg via ORAL
  Filled 2017-02-18 (×3): qty 1

## 2017-02-18 MED ORDER — SODIUM CHLORIDE 0.9 % IV SOLN
250.0000 mL | INTRAVENOUS | Status: DC | PRN
Start: 2017-02-18 — End: 2017-02-22

## 2017-02-18 MED ORDER — GLYCOPYRROLATE 1 MG PO TABS: 1.0000 mg | ORAL_TABLET | ORAL | Status: DC | PRN

## 2017-02-18 MED ORDER — HALOPERIDOL 0.5 MG PO TABS
0.5000 mg | ORAL_TABLET | ORAL | Status: DC | PRN
  Filled 2017-02-18: qty 1

## 2017-02-18 MED ORDER — SODIUM CHLORIDE 0.9% FLUSH: 3.0000 mL | INTRAVENOUS | Status: DC | PRN

## 2017-02-18 MED ORDER — POTASSIUM CHLORIDE CRYS ER 20 MEQ PO TBCR
20.0000 meq | EXTENDED_RELEASE_TABLET | Freq: Once | ORAL | Status: AC
  Administered 2017-02-18: 20 meq via ORAL
  Filled 2017-02-18: qty 1

## 2017-02-18 MED ORDER — SODIUM CHLORIDE 0.9% FLUSH
3.0000 mL | Freq: Two times a day (BID) | INTRAVENOUS | Status: DC
  Administered 2017-02-18 – 2017-02-21 (×4): 3 mL via INTRAVENOUS

## 2017-02-18 MED ORDER — CLONAZEPAM 0.125 MG PO TBDP
0.2500 mg | ORAL_TABLET | Freq: Every day | ORAL | Status: DC
  Administered 2017-02-18 – 2017-02-19 (×2): 0.25 mg via ORAL
  Filled 2017-02-18 (×2): qty 2

## 2017-02-18 MED ORDER — PREMIER PROTEIN SHAKE
11.0000 [oz_av] | Freq: Two times a day (BID) | ORAL | Status: DC
  Filled 2017-02-18 (×11): qty 325.31

## 2017-02-18 NOTE — Progress Notes (Addendum)
[facility] PROGRESS NOTE  MARIENA MEARES CHY:[id number] DOB: 1946-02-09 DOA: 02/09/2017  PCP: Haywood Pao, MD  Brief History/Interval Summary: 71 year old female with a past medical history of bipolar disorder, type 2 diabetes, chronic back pain, chronic kidney disease stage III, presented with abdominal pain. Was found to have diverticulitis with microperforation. She started improving. On the morning of 7/27 she complained of chest pain. EKG showed new changes. Cardiology was consulted. CT scan of the abdomen was repeated and showed worsening findings. Patient reevaluated by general surgery and they offered her surgical intervention, which she has declined so far. Subsequently palliative medicine was consulted by general surgery.  Reason for Visit: Acute diverticulitis  Consultants: Gen. surgery. Cardiology. Palliative medicine  Procedures:   Transthoracic echocardiogram Study Conclusions - Left ventricle: The cavity size was normal. Wall thickness was   increased in a pattern of moderate LVH. Systolic function was   normal. The estimated ejection fraction was in the range of 55%   to 60%. Doppler parameters are consistent with abnormal left   ventricular relaxation (grade 1 diastolic dysfunction). The E/e&'   ratio is >20, suggesting markedly elevated LV filling pressure. - Left atrium: The atrium was normal in size. - Inferior vena cava: The vessel was normal in size. The   respirophasic diameter changes were in the normal range (= 50%),   consistent with normal central venous pressure. - Pericardium, extracardiac: A trivial pericardial effusion was   identified posterior to the heart. Impressions:  - Compared to a prior study in 12/2016, the LVEF is higher at   55-60%. There is grade 1 DD with high LV filling pressure. A   trivial posterior pericardial effusion was noted.  Antibiotics: Was on Zosyn. Changed over to Augmentin 7/25. Placed back on Zosyn on  7/27  Subjective/Interval History: Patient had a bowel movement this morning after which she developed pain around her rectum area as well as lower abdomen. She complains of pain allover as well. Seems frustrated and not willing to answer many questions today. Her daughter is at the bedside..   ROS: Denies any headaches.  Objective:  Vital Signs  Vitals:   02/17/17 1940 02/17/17 2000 02/17/17 2207 02/18/17 0528  BP: (!) 142/64  (!) 165/66 (!) 166/75  Pulse:   66 (!) 58  Resp: 13  20 20   Temp:  97.8 F (36.6 C) 98.1 F (36.7 C) 97.6 F (36.4 C)  TempSrc:  Oral Oral Oral  SpO2:   97% 99%  Weight:   83 kg (183 lb)   Height:   5\' 3"  (1.6 m)     Intake/Output Summary (Last 24 hours) at 02/18/17 0952 Last data filed at 02/18/17 0827  Gross per 24 hour  Intake              355 ml  Output                0 ml  Net              355 ml   Filed Weights   02/16/17 0500 02/17/17 0500 02/17/17 2207  Weight: 85.6 kg (188 lb 11.4 oz) 83.9 kg (184 lb 15.5 oz) 83 kg (183 lb)    General appearance: Awake, alert. Appears to be in some discomfort. No distress. Resp: Clear to auscultation bilaterally. No wheezing, rales or rhonchi. Cardio: S1, S2 is normal, regular. S3, S4. No rubs, murmurs or bruits GI: Abdomen soft. Mildly tender in the lower quadrants but  without any rebound, rigidity or guarding. No masses or organomegaly. Sounds are present. Extremities: No edema Neurologic: No focal deficits  Lab Results:  Data Reviewed: I have personally reviewed following labs and imaging studies  CBC:  Recent Labs Lab 02/12/17 0539  02/14/17 0513 02/15/17 0529 02/16/17 0325 02/17/17 0507 02/18/17 0655  WBC 12.1*  < > 7.8 12.3* 10.0 10.5 11.3*  NEUTROABS 10.6*  --   --   --   --   --   --   HGB 8.9*  < > 9.4* 9.8* 9.5* 9.5* 9.9*  HCT 28.1*  < > 28.3* 30.1* 29.4* 28.5* 29.7*  MCV 87.5  < > 86.8 85.0 85.5 84.6 84.9  PLT 337  < > 333 380 370 358 417*  < > = values in this interval not  displayed.  Basic Metabolic Panel:  Recent Labs Lab 02/14/17 0513 02/15/17 0529 02/16/17 0325 02/17/17 0507 02/18/17 0655  NA 138 138 134* 134* 135  K 3.4* 3.3* 3.3* 3.2* 3.3*  CL 108 107 103 101 100*  CO2 20* 20* 21* 23 25  GLUCOSE 130* 168* 110* 108* 126*  BUN 71* 66* 61* 59* 57*  CREATININE 4.10* 3.79* 3.51* 3.64* 3.66*  CALCIUM 10.0 10.2 9.8 9.8 9.9    GFR: Estimated Creatinine Clearance: 14.2 mL/min (A) (by C-G formula based on SCr of 3.66 mg/dL (H)).  Liver Function Tests:  Recent Labs Lab 02/15/17 0529  AST 24  ALT 33  ALKPHOS 239*  BILITOT 0.9  PROT 5.9*  ALBUMIN 2.6*    CBG:  Recent Labs Lab 02/17/17 1215 02/17/17 1508 02/17/17 1744 02/17/17 2125 02/18/17 0803  GLUCAP 122* 153* 151* 208* 121*     Recent Results (from the past 240 hour(s))  Culture, blood (Routine X 2) w Reflex to ID Panel     Status: None   Collection Time: 02/09/17  3:40 PM  Result Value Ref Range Status   Specimen Description BLOOD RIGHT ANTECUBITAL  Final   Special Requests   Final    BOTTLES DRAWN AEROBIC AND ANAEROBIC Blood Culture adequate volume   Culture NO GROWTH 5 DAYS  Final   Report Status 02/14/2017 FINAL  Final  Culture, blood (Routine X 2) w Reflex to ID Panel     Status: None   Collection Time: 02/09/17  6:35 PM  Result Value Ref Range Status   Specimen Description BLOOD RIGHT HAND  Final   Special Requests   Final    BOTTLES DRAWN AEROBIC AND ANAEROBIC Blood Culture results may not be optimal due to an inadequate volume of blood received in culture bottles   Culture NO GROWTH 5 DAYS  Final   Report Status 02/14/2017 FINAL  Final  MRSA PCR Screening     Status: None   Collection Time: 02/15/17  2:25 PM  Result Value Ref Range Status   MRSA by PCR NEGATIVE NEGATIVE Final    Comment:        The GeneXpert MRSA Assay (FDA approved for NASAL specimens only), is one component of a comprehensive MRSA colonization surveillance program. It is not intended to  diagnose MRSA infection nor to guide or monitor treatment for MRSA infections.       Radiology Studies: No results found.   Medications:  Scheduled: . aspirin  325 mg Oral Daily  . carvedilol  6.25 mg Oral BID WC  . furosemide  80 mg Oral BID  . hydrALAZINE  25 mg Oral Q8H  . insulin aspart  0-15  Units Subcutaneous TID WC  . insulin aspart  0-5 Units Subcutaneous QHS  . insulin glargine  5 Units Subcutaneous QHS  . latanoprost  1 drop Both Eyes QPM  . levothyroxine  75 mcg Oral QAC breakfast  . LORazepam  0.26 mg Oral BID  . pantoprazole  40 mg Oral Q1200  . polyethylene glycol  17 g Oral Daily  . timolol  1 drop Both Eyes BID   Continuous: . heparin 1,350 Units/hr (02/17/17 2248)  . nitroGLYCERIN Stopped (02/15/17 2123)  . piperacillin-tazobactam (ZOSYN)  IV 2.25 g (02/18/17 0818)   LGX:[id number], diphenhydrAMINE, LORazepam, morphine injection, ondansetron **OR** ondansetron (ZOFRAN) IV, oxyCODONE, pentafluoroprop-tetrafluoroeth, sodium phosphate, traMADol  Assessment/Plan:  Principal Problem:   Diverticulitis Active Problems:   Hypertension   Bipolar 1 disorder (HCC)   Morbid obesity (BMI 39.21)    Depression, major, in partial remission (HCC)   CKD (chronic kidney disease) stage 3, GFR 30-59 ml/min   Hypothyroidism   T2_NIDDM   Anemia   Congestive heart failure (CHF) (HCC)   Perforation and abscess of large intestine concurrent with and due to diverticulitis   Palliative care encounter   Goals of care, counseling/discussion   DNR (do not resuscitate)    Acute diverticulitis with perforation and intra-abdominal abscess Patient was hospitalized and started on IV antibiotics. Seen by general surgery. Patient had been improving. Her WBC had improved to normal. She was changed over to oral antibiotics. However, WBC noted to be elevated on 7/27. Free air noted on chest x-ray. CT scan was done which shows pneumoperitoneum along with concern for bowel  perforation and abscess. Surgical intervention was recommended by surgery. However, patient has been refusing this intervention. She understands the risks of not undergoing surgery. She is concerned about the possible complications and prolonged recovery after surgery. She knows about the possibility of death if her infection were to get worse. Palliative medicine was consulted. They are in discussions with the patient and family regarding transitioning to hospice. Remains on Zosyn. Discussed with Dr. Rosendo Gros with general surgery. He is okay with her transitioning to oral antibiotics. We'll leave her on Zosyn for now until disposition plan is more concrete. WBC noted to be slightly higher today. She is afebrile. Pain control.  Chest Pain/Unstable Angina Vs NSTEMI Patient was found to have mildly elevated troponin along with new EKG changes. Patient seen by cardiology. Echocardiogram as above. She was placed on IV heparin and was also briefly on IV nitroglycerin. Chest pain has resolved. EKG with stable changes. Cardiology is following. Recommend heparin for 72 hours (started on Friday afternoon). Do not plan any invasive testing as there is a risk of worsening her renal function. Patient has refused surgery for perforated diverticulitis and intra-abdominal abscess at this time.   Normocytic anemia Hemoglobin is stable. No evidence for overt bleeding.  Chronic kidney disease stage III-hypokalemia Creatinine close to baseline. Patient was recently hospitalized in July for syncope thought to be due to aggressive diuresis. Her dose of Lasix was reduced to 80 twice a day. She was initially kept off of Lasix. This was subsequently resumed. Stable.   Chronic diastolic CHF. Seems euvolemic currently. Continue Lasix.  History of essential hypertension. Stable.  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 Continue to monitor CBGs. Continue Lantus. Holding her oral agents. CBGs are reasonably  well controlled.  Hypothyroidism. Continue with levothyroxine  ADDENDUM Discussed with the palliative care team. They're recommending changing to oral antibiotics based on patient's wishes. We will change  to Augmentin. Possible transition to hospice, depending on how the patient does in the next 1-2 days.  DVT Prophylaxis: SCDs    Code Status: Full code  Family Communication: Scheduled patient and daughter. Nursing staff notified to give her pain medications. Disposition Plan: Management as outlined above. Palliative medicine to discuss further with patient and family.    LOS: 9 days   [place] [facility] Pager [phone] 02/18/2017, 9:52 AM  If 7PM-7AM, please contact night-coverage at [email or web address], password [facility]

## 2017-02-18 NOTE — Progress Notes (Signed)
Nutrition Follow-up  DOCUMENTATION CODES:   Obesity unspecified  INTERVENTION:   -Pt would like to try several different supplements at this time (pt does not like "Milky" Boost/Ensure/Glucerna):  Pro-Stat 30 mL BID, each packet contains 100 kcals, 15 g of protein  Premier Protein BID, each supplement provides 160 kcals and 30 g of protein  Boost Breeze po BID, each supplement provides 250 kcal and 9 grams of protein  Yogurt TID on meal trays  NUTRITION DIAGNOSIS:   Increased nutrient needs related to acute illness (diverticulitis) as evidenced by estimated needs.  Continues but being addressed  GOAL:   Patient will meet greater than or equal to 90% of their needs  Progressing  MONITOR:   PO intake, Supplement acceptance, Labs, Weight trends, Skin, I & O's, Diet advancement  REASON FOR ASSESSMENT:   Consult Diet education  ASSESSMENT:   Alyssa [name] is a 71 y.o. female with medical history significant for anemia, bipolar, diabetes, hypertension , hypothyroidism recent diagnosis of chronic kidney disease stage III recently hospitalized for syncope workup presents to the emergency department with the chief complaint abdominal pain nausea with one episode of emesis. Initial evaluation includes CT of the abdomen revealing diverticulitis with microperforation.  Pt with perforated diverticulitis, worsened since admission, surgery recommend surgical intervention which pt has declined, palliative care following  Pt tolerating FL but eating limited amounts. Avoids all sweets like pudding and ice cream and sugar sweetened beverages such as sweet tea and juice. Pt is eating yogurt. Discussed importance of adequate nutrition and discussed options for supplementation  Labs: Creatnine 3.66, potassium 3.3 Meds: lasix, ss novolog, lantus  Diet Order:  Diet full liquid Room service appropriate? Yes; Fluid consistency: Thin  Skin:  Reviewed, no issues  Last BM:  7/27  Height:    Ht Readings from Last 1 Encounters:  02/17/17 5\' 3"  (1.6 m)    Weight:   Wt Readings from Last 1 Encounters:  02/17/17 183 lb (83 kg)    Ideal Body Weight:  52.3 kg  BMI:  Body mass index is 32.42 kg/m.  Estimated Nutritional Needs:   Kcal:  1650-1850  Protein:  90-105 grams  Fluid:  > 1.6 L  EDUCATION NEEDS:   No education needs identified at this time  [place], [place], LDN [phone] Pager  [phone] Weekend/On-Call Pager

## 2017-02-18 NOTE — Progress Notes (Signed)
ANTICOAGULATION CONSULT NOTE - Follow Up Consult  Pharmacy Consult for heparin Indication: chest pain/ACS  Labs:  Recent Labs  02/15/17 1201 02/15/17 1912  02/16/17 0325  02/16/17 1808 02/17/17 0507 02/18/17 0655  HGB  --   --   < > 9.5*  --   --  9.5* 9.9*  HCT  --   --   --  29.4*  --   --  28.5* 29.7*  PLT  --   --   --  370  --   --  358 417*  HEPARINUNFRC  --  1.60*  < >  --   < > 0.44 0.46 0.30  CREATININE  --   --   --  3.51*  --   --  3.64* 3.66*  TROPONINI 0.06* 0.04*  --   --   --   --   --   --   < > = values in this interval not displayed.  Assessment: 71yo female continues on heparin for ACS. HL= 0.30, remains therapeutic at 1350 units/hr. H/H low stable, Plt wnl.  No bleeding noted  Perforated diverticulitis-surgery consulted and notes that patient DOES NOT want surgery at this time.   Goal of Therapy:  Heparin level 0.3-0.7 units/ml   Plan:  Continue heparin at 1350 units/h Daily heparin level and CBC Follow for s/s bleeding   Thank you for allowing pharmacy to be part of this patients care team. Nicole Cella, RPh Clinical Pharmacist Pager: [phone] 8A-4P [phone] 4P-10P [phone] [place] [phone] 02/18/2017 12:15 PM

## 2017-02-18 NOTE — Progress Notes (Signed)
   Subjective/Chief Complaint: Pt with NAE Palliative care assessment noted   Objective: Vital signs in last 24 hours: Temp:  [97.6 F (36.4 C)-98.1 F (36.7 C)] 97.6 F (36.4 C) (07/30 0528) Pulse Rate:  [58-66] 58 (07/30 0528) Resp:  [12-20] 20 (07/30 0528) BP: (124-166)/(62-80) 166/75 (07/30 0528) SpO2:  [97 %-99 %] 99 % (07/30 0528) Weight:  [83 kg (183 lb)] 83 kg (183 lb) (07/29 2207) Last BM Date: 02/15/17  Intake/Output from previous day: 07/29 0701 - 07/30 0700 In: 262 [I.V.:162; IV Piggyback:100] Out: -  Intake/Output this shift: Total I/O In: 120 [P.O.:120] Out: -   General appearance: alert and cooperative GI: soft, ttp lower abdomen  Lab Results:   Recent Labs  02/17/17 0507 02/18/17 0655  WBC 10.5 11.3*  HGB 9.5* 9.9*  HCT 28.5* 29.7*  PLT 358 417*   BMET  Recent Labs  02/17/17 0507 02/18/17 0655  NA 134* 135  K 3.2* 3.3*  CL 101 100*  CO2 23 25  GLUCOSE 108* 126*  BUN 59* 57*  CREATININE 3.64* 3.66*  CALCIUM 9.8 9.9   PT/INR No results for input(s): LABPROT, INR in the last 72 hours. ABG No results for input(s): PHART, HCO3 in the last 72 hours.  Invalid input(s): PCO2, PO2  Studies/Results: No results found.  Anti-infectives: Anti-infectives    Start     Dose/Rate Route Frequency Ordered Stop   02/15/17 1745  piperacillin-tazobactam (ZOSYN) IVPB 2.25 g     2.25 g 100 mL/hr over 30 Minutes Intravenous Every 8 hours 02/15/17 1707     02/13/17 1100  amoxicillin-clavulanate (AUGMENTIN) 875-125 MG per tablet 1 tablet  Status:  Discontinued     1 tablet Oral Every 12 hours 02/13/17 1046 02/13/17 1049   02/13/17 1100  amoxicillin-clavulanate (AUGMENTIN) 500-125 MG per tablet 500 mg  Status:  Discontinued     1 tablet Oral Every 24 hours 02/13/17 1050 02/15/17 1648   02/09/17 2300  piperacillin-tazobactam (ZOSYN) IVPB 2.25 g  Status:  Discontinued     2.25 g 100 mL/hr over 30 Minutes Intravenous Every 8 hours 02/09/17 1454  02/13/17 1046   02/09/17 1400  piperacillin-tazobactam (ZOSYN) IVPB 3.375 g     3.375 g 100 mL/hr over 30 Minutes Intravenous  Once 02/09/17 1355 02/09/17 1512      Assessment/Plan: Perforated diverticulitis CHF CKD  Pt DOES NOT want surgery at this time. Pt on abx for perforated diverticulitis.  Would adv diet as tol and tx with abx x 2 weeks at min. Please call back with any questions     LOS: 9 days    Rosario Jacks., [facility] 02/18/2017

## 2017-02-18 NOTE — Progress Notes (Addendum)
Pharmacy Antibiotic Note  Alyssa [name] is a 71 y.o. female  with intra-abdominal infection.  Pharmacy was consulted on 02/09/17 for Zosyn dosing for perforated diverticulitis. Afebrile, WBC wnl CKD: Scr 3.66,  crcl 14 ml/min   Surgery service has noted on 7/30 that patient DOES NOT want surgery.   Plan: Continue IV Zosyn 2.25gm IVq8h Monitor renal function, clinical status, LOT, ability to de-escalate  Height: 5\' 3"  (160 cm) Weight: 183 lb (83 kg) IBW/kg (Calculated) : 52.4  Temp (24hrs), Avg:97.9 F (36.6 C), Min:97.6 F (36.4 C), Max:98.5 F (36.9 C)   Recent Labs Lab 02/14/17 0513 02/15/17 0529 02/16/17 0325 02/17/17 0507 02/18/17 0655  WBC 7.8 12.3* 10.0 10.5 11.3*  CREATININE 4.10* 3.79* 3.51* 3.64* 3.66*    Estimated Creatinine Clearance: 14.2 mL/min (A) (by C-G formula based on SCr of 3.66 mg/dL (H)).    Allergies  Allergen Reactions  . Ciprofloxacin Hcl Diarrhea  . Cymbalta [Duloxetine Hcl] Other (See Comments)    "Ineffective"  . Lipitor [Atorvastatin] Other (See Comments)    Makes the patient "not feel right"  . Xanax [Alprazolam] Other (See Comments)    Excessive sleepiness  . Codeine     Reaction not recalled  . Lortab [Hydrocodone-Acetaminophen] Other (See Comments)    Reaction not recalled  . Oxycodone Other (See Comments)    Reaction not recalled    Zosyn 7/21 >>7/26, 7/27>> Augmentin 7/27 x3  7/21 BCx: negative [place] 7/27 MRSA PCR neg   Thank you for allowing pharmacy to be a part of this patient's care.  Nicole Cella, RPh Clinical Pharmacist Pager: [phone] 8A-4P [phone] 4P-10P [phone] [place] [phone] 02/18/2017 12:25 PM

## 2017-02-18 NOTE — Progress Notes (Signed)
Pharmacy Antibiotic Note  Alyssa [name] is a 71 y.o. female admitted on 02/09/2017 with perforated diverticulitis.  Pharmacy has been consulted for Augmentin dosing. Patient has been on Zosyn but now wishes to change to oral therapy. CrCl is ~ 14 mL/min.   Plan: Augmentin 500mg  po every 12 hours.  Monitor renal function, clinical status.   Height: 5\' 3"  (160 cm) Weight: 183 lb (83 kg) IBW/kg (Calculated) : 52.4  Temp (24hrs), Avg:97.9 F (36.6 C), Min:97.6 F (36.4 C), Max:98.5 F (36.9 C)   Recent Labs Lab 02/14/17 0513 02/15/17 0529 02/16/17 0325 02/17/17 0507 02/18/17 0655  WBC 7.8 12.3* 10.0 10.5 11.3*  CREATININE 4.10* 3.79* 3.51* 3.64* 3.66*    Estimated Creatinine Clearance: 14.2 mL/min (A) (by C-G formula based on SCr of 3.66 mg/dL (H)).    Allergies  Allergen Reactions  . Ciprofloxacin Hcl Diarrhea  . Cymbalta [Duloxetine Hcl] Other (See Comments)    "Ineffective"  . Lipitor [Atorvastatin] Other (See Comments)    Makes the patient "not feel right"  . Xanax [Alprazolam] Other (See Comments)    Excessive sleepiness  . Codeine     Reaction not recalled  . Lortab [Hydrocodone-Acetaminophen] Other (See Comments)    Reaction not recalled  . Oxycodone Other (See Comments)    Reaction not recalled     Antimicrobials this admission: Zosyn 7/21 >> 7/30 Augmentin 7/30 >>  Dose adjustments this admission:   Microbiology results: 7/27 MRSA PCR - negative 7/21 Blood - negative  Thank you for allowing pharmacy to be a part of this patient's care.  Sloan Leiter, PharmD, BCPS Clinical Pharmacist Clinical Phone 02/18/2017 until 11 PM - [phone] After hours, please call #[phone] 02/18/2017 2:43 PM

## 2017-02-18 NOTE — Progress Notes (Signed)
Progress Note  Patient Name: Alyssa [name] Date of Encounter: 02/18/2017  Primary Cardiologist: Dr. Claiborne Billings  Subjective   Chest pain free.   Inpatient Medications    Scheduled Meds: . amoxicillin-clavulanate  1 tablet Oral BID  . aspirin  325 mg Oral Daily  . carvedilol  6.25 mg Oral BID WC  . clonazepam  0.25 mg Oral QHS  . feeding supplement  1 Container Oral BID  . feeding supplement (PRO-STAT SUGAR FREE 64)  30 mL Oral BID  . fentaNYL  12.5 mcg Transdermal Q72H  . furosemide  80 mg Oral BID  . hydrALAZINE  25 mg Oral Q8H  . insulin aspart  0-15 Units Subcutaneous TID WC  . insulin aspart  0-5 Units Subcutaneous QHS  . insulin glargine  5 Units Subcutaneous QHS  . latanoprost  1 drop Both Eyes QPM  . LORazepam  0.26 mg Oral Daily  . pantoprazole  40 mg Oral Q1200  . polyethylene glycol  17 g Oral Daily  . protein supplement shake  11 oz Oral BID  . sodium chloride flush  3 mL Intravenous Q12H  . timolol  1 drop Both Eyes BID   Continuous Infusions: . sodium chloride     PRN Meds: sodium chloride, acetaminophen, antiseptic oral rinse, diphenhydrAMINE, glycopyrrolate **OR** glycopyrrolate **OR** glycopyrrolate, haloperidol **OR** haloperidol **OR** haloperidol lactate, LORazepam, morphine injection, ondansetron **OR** ondansetron (ZOFRAN) IV, oxyCODONE, pentafluoroprop-tetrafluoroeth, polyvinyl alcohol, sodium chloride flush, sodium phosphate, traMADol   Vital Signs    Vitals:   02/17/17 2207 02/18/17 0528 02/18/17 1159 02/18/17 1206  BP: (!) 165/66 (!) 166/75 (!) 171/66 (!) 148/78  Pulse: 66 (!) 58 (!) 57   Resp: 20 20    Temp: 98.1 F (36.7 C) 97.6 F (36.4 C) 98.5 F (36.9 C)   TempSrc: Oral Oral Oral   SpO2: 97% 99%    Weight: 183 lb (83 kg)     Height: 5\' 3"  (1.6 m)       Intake/Output Summary (Last 24 hours) at 02/18/17 1524 Last data filed at 02/18/17 0827  Gross per 24 hour  Intake            264.5 ml  Output                0 ml  Net             264.5 ml   Filed Weights   02/16/17 0500 02/17/17 0500 02/17/17 2207  Weight: 188 lb 11.4 oz (85.6 kg) 184 lb 15.5 oz (83.9 kg) 183 lb (83 kg)   Telemetry    SR - Personally Reviewed  ECG    SR - Personally Reviewed  Physical Exam   GEN: No acute distress.   Neck: No JVD Cardiac: RRR, no murmurs, rubs, or gallops.  Respiratory: Clear to auscultation bilaterally. GI: Soft, nontender, non-distended  MS: No edema; No deformity. Neuro:  Nonfocal  Psych: Normal affect   Labs    Chemistry Recent Labs Lab 02/15/17 0529 02/16/17 0325 02/17/17 0507 02/18/17 0655  NA 138 134* 134* 135  K 3.3* 3.3* 3.2* 3.3*  CL 107 103 101 100*  CO2 20* 21* 23 25  GLUCOSE 168* 110* 108* 126*  BUN 66* 61* 59* 57*  CREATININE 3.79* 3.51* 3.64* 3.66*  CALCIUM 10.2 9.8 9.8 9.9  PROT 5.9*  --   --   --   ALBUMIN 2.6*  --   --   --   AST 24  --   --   --  ALT 33  --   --   --   ALKPHOS 239*  --   --   --   BILITOT 0.9  --   --   --   GFRNONAA 11* 12* 12* 11*  GFRAA 13* 14* 13* 13*  ANIONGAP 11 10 10 10      Hematology Recent Labs Lab 02/16/17 0325 02/17/17 0507 02/18/17 0655  WBC 10.0 10.5 11.3*  RBC 3.44* 3.37* 3.50*  HGB 9.5* 9.5* 9.9*  HCT 29.4* 28.5* 29.7*  MCV 85.5 84.6 84.9  MCH 27.6 28.2 28.3  MCHC 32.3 33.3 33.3  RDW 14.8 14.8 14.9  PLT 370 358 417*    Cardiac Enzymes Recent Labs Lab 02/15/17 0529 02/15/17 1201 02/15/17 1912  TROPONINI 0.08* 0.06* 0.04*   No results for input(s): TROPIPOC in the last 168 hours.   BNPNo results for input(s): BNP, PROBNP in the last 168 hours.   DDimer No results for input(s): DDIMER in the last 168 hours.   Radiology    No results found.  Cardiac Studies     Patient Profile     71 y.o. female   [place]    1. Unstable angina with significant ECG abnormalities suggesting LAD distribution ischemia. No active angina symptoms in the last 72 hours. Peak troponin I 0.08. Cardiac catheterization is not being  pursued in light of very high risk of contrast nephropathy, minimal troponin elevation and lack of symptoms. Also poor overall prognosis, palliative care follows. She is currently on aspirin, Coreg, hydralazine, Lasix, Heparin will be stopped - completed 72 hours course. LVEF 55-60% by echocardiogram.  2. 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  3. Essential hypertension, recent systolic blood pressures 347Q to 160s. She is on Coreg and hydralazine.  4. History of hyperlipidemia with reported statin allergy.  5. Acute diverticulitis associated with perforation of intra-abdominal abscess. She continues on broad-spectrum antibiotics. She has been seen by the surgical team and has declined surgery at this point. Palliative care consultation pending as well.  6. HTN - increase hydralazine to 50 mg po TID  Signed, Ena Dawley, MD  02/18/2017, 3:24 PM

## 2017-02-18 NOTE — Progress Notes (Signed)
Daily Progress Note   Patient Name: Alyssa [name]       Date: 02/18/2017 DOB: 07/11/1946  Age: 71 y.o. MRN#: [id number] Attending Physician: Bonnielee Haff, MD Primary Care Physician: Haywood Pao, MD Admit Date: 02/09/2017  Reason for Consultation/Follow-up: Establishing goals of care  Subjective: Discussed the "terminality" of a significant intestinal perforation in combination with advanced kidney disease.  Patient is not surprised but was thinking she had at least 6 months left.  We discussed progressing towards all PO medications and turning off IVF and IV medications.  This will allow [name] to get a better sense of how she is really doing.     We discussed hospice at home vs [place].  If she declines quickly in the next 24 - 48 hours she will be very appropriate for [facility].  If she remains stable the best plan will be to go home with Hospice support.  We completed and signed a MOST form.  She wants comfort measures only.  Assessment: Patient accepts that she is near EOL.  Her priority is comfort.  She a very low pain tolerance.   I am very concerned that she will rapidly deteriorate once IV medications are discontinued.   Patient Profile/HPI:  71 y.o. female  with past medical history of chronic back pain, chronic kidney disease, and congestive heart failure who was admitted on 02/09/2017 with diverticulitis with suspected microperforation.  Additionally she had elevated troponin and acute on chronic renal failure.  She was seen by surgery who offered surgery.  She was seen by cardiology who felt she likely had some type of ischemic injury but felt that due to her kidney disease she was not a candidate for a cardiac cath. Further Cardiology state that she was at high risk  for a perioperative cardiac event.  Shortly before I met the patient she made up her mind not to have surgery.  Length of Stay: 9  Current Medications: Scheduled Meds:  . amoxicillin-clavulanate  1 tablet Oral BID  . aspirin  325 mg Oral Daily  . carvedilol  6.25 mg Oral BID WC  . clonazepam  0.25 mg Oral QHS  . feeding supplement  1 Container Oral BID  . feeding supplement (PRO-STAT SUGAR FREE 64)  30 mL Oral BID  . fentaNYL  12.5  mcg Transdermal Q72H  . furosemide  80 mg Oral BID  . hydrALAZINE  25 mg Oral Q8H  . insulin aspart  0-15 Units Subcutaneous TID WC  . insulin aspart  0-5 Units Subcutaneous QHS  . insulin glargine  5 Units Subcutaneous QHS  . latanoprost  1 drop Both Eyes QPM  . LORazepam  0.26 mg Oral Daily  . pantoprazole  40 mg Oral Q1200  . polyethylene glycol  17 g Oral Daily  . protein supplement shake  11 oz Oral BID  . sodium chloride flush  3 mL Intravenous Q12H  . timolol  1 drop Both Eyes BID    Continuous Infusions: . sodium chloride    . heparin 1,350 Units/hr (02/17/17 2248)    PRN Meds: sodium chloride, acetaminophen, antiseptic oral rinse, diphenhydrAMINE, glycopyrrolate **OR** glycopyrrolate **OR** glycopyrrolate, haloperidol **OR** haloperidol **OR** haloperidol lactate, LORazepam, morphine injection, ondansetron **OR** ondansetron (ZOFRAN) IV, oxyCODONE, pentafluoroprop-tetrafluoroeth, polyvinyl alcohol, sodium chloride flush, sodium phosphate, traMADol  Physical Exam       Well developed female with her hair in pig tails. Sitting up in recliner chair.  NAD.  A&O.   Vital Signs: BP (!) 148/78 (BP Location: Right Arm)   Pulse (!) 57   Temp 98.5 F (36.9 C) (Oral)   Resp 20   Ht '5\' 3"'$  (1.6 m)   Wt 83 kg (183 lb)   SpO2 99%   BMI 32.42 kg/m  SpO2: SpO2: 99 % O2 Device: O2 Device: Not Delivered O2 Flow Rate:    Intake/output summary:  Intake/Output Summary (Last 24 hours) at 02/18/17 1451 Last data filed at 02/18/17 0827  Gross per 24  hour  Intake            264.5 ml  Output                0 ml  Net            264.5 ml   LBM: Last BM Date: 02/15/17 Baseline Weight: Weight: 79.4 kg (175 lb) Most recent weight: Weight: 83 kg (183 lb)       Palliative Assessment/Data:      Patient Active Problem List   Diagnosis Date Noted  . Perforation and abscess of large intestine concurrent with and due to diverticulitis   . Palliative care encounter   . Goals of care, counseling/discussion   . DNR (do not resuscitate)   . Diverticulitis 02/09/2017  . Syncope and collapse 02/01/2017  . Syncope 02/01/2017  . 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 Pleural effusion on right 01/24/2017  . Acute renal failure with acute tubular necrosis superimposed on stage 3 chronic kidney disease ([place])   . Acute on chronic diastolic CHF (congestive heart failure) ([place])   . Abnormal liver function   . Anemia 01/04/2017  . Congestive heart failure (CHF) ([place]) 01/04/2017  . T2_NIDDM 03/31/2015  . Actinic keratitis 02/23/2015  . Encounter for Medicare annual wellness exam 02/09/2015  . Medication management 07/14/2014  . Hypothyroidism 07/14/2014  . CKD (chronic kidney disease) stage 3, GFR 30-59 ml/min 06/24/2014  . Gout 06/24/2014  . Acute diastolic congestive heart failure ([place]) 10/13/2013  . Morbid obesity (BMI 39.21)  07/15/2013  . Depression, major, in partial remission ([place]) 07/15/2013  . Vitamin D deficiency   . Osteoporosis   . 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 Hypertension   . Bipolar 1 disorder ([place])   . Hyperlipidemia     Palliative Care Plan    Recommendations/Plan:  MOST form COMPLETED.  COMFORT MEASURES ONLY  At this point patient is still concerned with her CBGs so we will continue checking.    She does not want further lab draws.  If she remains stable on PO meds recommend home with Hospice.  If she declines quickly recommend Hospice House.  This will become clear in 1-2 days.  Goals of Care and  Additional Recommendations:  Limitations on Scope of Treatment: Minimize Medications, No Artificial Feeding, No Hemodialysis, No IV Antibiotics, No IV Fluids, No Lab Draws and No Surgical Procedures  Code Status:  DNR  Prognosis:  Days to weeks.  Discharge Planning:  To Be Determined  Care plan was discussed with attending physician and case management  Thank you for allowing the Palliative Medicine Team to assist in the care of this patient.  Total time spent:  60 min.     Greater than 50%  of this time was spent counseling and coordinating care related to the above assessment and plan.  Florentina Jenny, PA-C Palliative Medicine  Please contact Palliative MedicineTeam phone at [phone] for questions and concerns between 7 am - 7 pm.   Please see AMION for individual provider pager numbers.

## 2017-02-18 NOTE — Progress Notes (Signed)
Patient arrived to 3e22. No complaints of pain. SB on telemetry. A&O x 4. No skin breakdown. Oriented to room and Call system.

## 2017-02-19 LAB — GLUCOSE, CAPILLARY
GLUCOSE-CAPILLARY: 74 mg/dL (ref 65–99)
GLUCOSE-CAPILLARY: 124 mg/dL — AB (ref 65–99)
Glucose-Capillary: 129 mg/dL — ABNORMAL HIGH (ref 65–99)
Glucose-Capillary: 228 mg/dL — ABNORMAL HIGH (ref 65–99)
Glucose-Capillary: 309 mg/dL — ABNORMAL HIGH (ref 65–99)

## 2017-02-19 MED ORDER — HYDROCORTISONE 2.5 % RE CREA
TOPICAL_CREAM | RECTAL | Status: DC | PRN
  Filled 2017-02-19 (×2): qty 28.35

## 2017-02-19 MED ORDER — FENTANYL 25 MCG/HR TD PT72: 25.0000 ug | MEDICATED_PATCH | TRANSDERMAL | Status: DC

## 2017-02-19 NOTE — Consult Note (Signed)
   [facility] Inpatient Consult   02/19/2017  YENTL VERGE [birth year] [id number]   Follow up:  Patient chart reviewed and patient and family declined [facility] services. Notes that patient is currently to discharge to Palliative Care services.  She will receive full care management services. [facility] Care Management will sign off.  Natividad Brood, RN BSN [facility] Liaison  [phone] business mobile phone Toll free office [phone]

## 2017-02-19 NOTE — Plan of Care (Signed)
Problem: Pain Managment: Goal: General experience of comfort will improve Outcome: Progressing Working with palliative on pain control   Problem: Tissue Perfusion: Goal: Risk factors for ineffective tissue perfusion will decrease Outcome: Progressing Maintains oxygen saturation in the 90's on room air   Problem: Activity: Goal: Risk for activity intolerance will decrease Outcome: Progressing Up with walker and standby assist   Problem: Nutrition: Goal: Adequate nutrition will be maintained Outcome: Progressing Eating 100% of full liquid meals and drinking Breeze

## 2017-02-19 NOTE — Progress Notes (Addendum)
Daily Progress Note   Patient Name: Alyssa [name]       Date: 02/19/2017 DOB: 1946/03/28  Age: 71 y.o. MRN#: [id number] Attending Physician: Bonnielee Haff, MD Primary Care Physician: Haywood Pao, MD Admit Date: 02/09/2017  Reason for Consultation/Follow-up: Establishing goals of care  Subjective: Very difficult night due to fall precautions and having to have assistance to the bathroom.  Patient has some pain but does not describe it as severe.  She is eating all full liquids.  She reports ambulating in the hallway with her walker.    Daughter and patient describe emotional upheaval.  With a perforated intestine the expected her to decline very quickly - but she has improved.  Even after eating and transitioning to oral antibiotics.  Dtr states if her mother has any little pain - Amy wonders - is this gas? Or is she about to die from a perforated intestine?   We discussed the complexity of the situation - she has a condition that could take her life quickly , but she is doing surprisingly well.   She does not know how to make plans.  Amy requested a follow up CT.  I discussed the potential benefit of a follow up CT with Dr. Jobe Igo of radiology.  He reviewed her imaging and suggested that a CT with oral contrast may show increased or decreased free air - or changes in the two areas of potential abscess in her colon.      Assessment: Patient with perforated bowel doing surprisingly well.  Eating all fulls.  Walking in hallway.  Patient still does not want surgery.    Patient Profile/HPI:  71 y.o. female with past medical history of chronic back pain, chronic kidney disease stg 4, and congestive heart failure who was admitted on 02/09/2017 with diverticulitis with suspected  microperforation.  Additionally she had elevated troponin and acute on chronic renal failure.  She was seen by surgery who offered surgery.  She was seen by cardiology who felt she likely had some type of ischemic injury but felt that due to her kidney disease she was not a candidate for a cardiac cath.  Further Cardiology state that she was at high risk for a perioperative cardiac event. Shortly before I met the patient she made up her mind not to have surgery.  Length of Stay: 10  Current Medications: Scheduled Meds:  . amoxicillin-clavulanate  1 tablet Oral BID  . aspirin  325 mg Oral Daily  . carvedilol  6.25 mg Oral BID WC  . clonazepam  0.25 mg Oral QHS  . feeding supplement  1 Container Oral BID  . feeding supplement (PRO-STAT SUGAR FREE 64)  30 mL Oral BID  . [START ON 02/21/2017] fentaNYL  25 mcg Transdermal Q72H  . furosemide  80 mg Oral BID  . hydrALAZINE  50 mg Oral Q8H  . insulin aspart  0-15 Units Subcutaneous TID WC  . insulin aspart  0-5 Units Subcutaneous QHS  . insulin glargine  5 Units Subcutaneous QHS  . latanoprost  1 drop Both Eyes QPM  . LORazepam  0.26 mg Oral Daily  . pantoprazole  40 mg Oral Q1200  . polyethylene glycol  17 g Oral Daily  . protein supplement shake  11 oz Oral BID  . sodium chloride flush  3 mL Intravenous Q12H  . timolol  1 drop Both Eyes BID    Continuous Infusions: . sodium chloride      PRN Meds: sodium chloride, acetaminophen, antiseptic oral rinse, diphenhydrAMINE, glycopyrrolate **OR** glycopyrrolate **OR** glycopyrrolate, haloperidol **OR** haloperidol **OR** haloperidol lactate, hydrocortisone, LORazepam, morphine injection, ondansetron **OR** ondansetron (ZOFRAN) IV, oxyCODONE, pentafluoroprop-tetrafluoroeth, polyvinyl alcohol, sodium chloride flush, sodium phosphate  Physical Exam        Well developed female,  A&O, frustrated, cooperative, appreciative.  dtr at bedside.  Vital Signs: BP (!) 144/54 (BP Location: Right Arm)    Pulse 73   Temp 97.7 F (36.5 C) (Oral)   Resp 18   Ht '5\' 3"'$  (1.6 m)   Wt 81.1 kg (178 lb 11.2 oz)   SpO2 98%   BMI 31.66 kg/m  SpO2: SpO2: 98 % O2 Device: O2 Device: Not Delivered O2 Flow Rate:    Intake/output summary:  Intake/Output Summary (Last 24 hours) at 02/19/17 1452 Last data filed at 02/19/17 1300  Gross per 24 hour  Intake              460 ml  Output                0 ml  Net              460 ml   LBM: Last BM Date: 02/18/17 Baseline Weight: Weight: 79.4 kg (175 lb) Most recent weight: Weight: 81.1 kg (178 lb 11.2 oz)       Palliative Assessment/Data:      Patient Active Problem List   Diagnosis Date Noted  . SOB (shortness of breath)   . NSTEMI (non-ST elevated myocardial infarction) ([place])   . Perforation and abscess of large intestine concurrent with and due to diverticulitis   . Palliative care encounter   . Goals of care, counseling/discussion   . DNR (do not resuscitate)   . Diverticulitis 02/09/2017  . Syncope and collapse 02/01/2017  . Syncope 02/01/2017  . 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 Pleural effusion on right 01/24/2017  . Acute renal failure with acute tubular necrosis superimposed on stage 3 chronic kidney disease ([place])   . Acute on chronic diastolic CHF (congestive heart failure) ([place])   . Abnormal liver function   . Anemia 01/04/2017  . Congestive heart failure (CHF) ([place]) 01/04/2017  . T2_NIDDM 03/31/2015  . Actinic keratitis 02/23/2015  . Encounter for Medicare annual wellness exam 02/09/2015  . Medication management 07/14/2014  . Hypothyroidism 07/14/2014  .  CKD (chronic kidney disease) stage 3, GFR 30-59 ml/min 06/24/2014  . Gout 06/24/2014  . Acute diastolic congestive heart failure ([place]) 10/13/2013  . Morbid obesity (BMI 39.21)  07/15/2013  . Depression, major, in partial remission ([place]) 07/15/2013  . Vitamin D deficiency   . Osteoporosis   . 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 Hypertension   . Bipolar 1  disorder ([place])   . Hyperlipidemia     Palliative Care Plan    Recommendations/Plan:  Watchful waiting.  Need to determine if she will decline after being taken off of IV antibiotics.  Follow up CT with oral only contrast and potentially a set of labs in the morning.  If all is positive would offer discharge to CIR or SNF for rehab with Palliative to follow.  Otherwise we need to consider home with hospice and additional help in the home.  At this point she is not a candidate for Hospice House (walking, eating, alert - and improving.)   Goals of Care and Additional Recommendations:  Limitations on Scope of Treatment: No Surgical Procedures  Code Status:  DNR  Prognosis:   Unable to determine   Discharge Planning:  To Be Determined  CIR? SNF? With Palliative  Care plan was discussed with RN, family.  Thank you for allowing the Palliative Medicine Team to assist in the care of this patient.  Total time spent:  60 min.     Greater than 50%  of this time was spent counseling and coordinating care related to the above assessment and plan.  Florentina Jenny, PA-C Palliative Medicine  Please contact Palliative MedicineTeam phone at [phone] for questions and concerns between 7 am - 7 pm.   Please see AMION for individual provider pager numbers.

## 2017-02-19 NOTE — Plan of Care (Signed)
Problem: Safety: Goal: Ability to remain free from injury will improve Outcome: Progressing Bed alarm used; pt. Likes to get up alone when in chair- will need chair alarm.

## 2017-02-19 NOTE — Progress Notes (Signed)
[facility] PROGRESS NOTE  Alyssa [name] ZJQ:[id number] DOB: 10-Dec-1945 DOA: 02/09/2017  PCP: Haywood Pao, MD  Brief History/Interval Summary: 71 year old female with a past medical history of bipolar disorder, type 2 diabetes, chronic back pain, chronic kidney disease stage III, presented with abdominal pain. Was found to have diverticulitis with microperforation. She started improving. On the morning of 7/27 she complained of chest pain. EKG showed new changes. Cardiology was consulted. CT scan of the abdomen was repeated and showed worsening findings. Patient reevaluated by general surgery and they offered her surgical intervention, which she has declined so far. Subsequently palliative medicine was consulted by general surgery. She was transitioned to oral antibiotics. Depending on how she does over the next 1-2 days, she could be transitioned over to hospice.  Reason for Visit: Acute diverticulitis  Consultants: Gen. surgery. Cardiology. Palliative medicine  Procedures:   Transthoracic echocardiogram Study Conclusions - Left ventricle: The cavity size was normal. Wall thickness was   increased in a pattern of moderate LVH. Systolic function was   normal. The estimated ejection fraction was in the range of 55%   to 60%. Doppler parameters are consistent with abnormal left   ventricular relaxation (grade 1 diastolic dysfunction). The E/e&'   ratio is >20, suggesting markedly elevated LV filling pressure. - Left atrium: The atrium was normal in size. - Inferior vena cava: The vessel was normal in size. The   respirophasic diameter changes were in the normal range (= 50%),   consistent with normal central venous pressure. - Pericardium, extracardiac: A trivial pericardial effusion was   identified posterior to the heart. Impressions:  - Compared to a prior study in 12/2016, the LVEF is higher at   55-60%. There is grade 1 DD with high LV filling pressure. A   trivial  posterior pericardial effusion was noted.  Antibiotics: Was on Zosyn. Changed over to Augmentin 7/25. Placed back on Zosyn on 7/27. Back to Augmentin on 7/30.  Subjective/Interval History: Patient feels well this morning. She had a bowel movement earlier today. Denies any discomfort. Daughter is at the bedside and inquiring about repeating a CT scan of her abdomen.   ROS: Denies any headaches.  Objective:  Vital Signs  Vitals:   02/18/17 1949 02/19/17 0629 02/19/17 1019 02/19/17 1024  BP: (!) 160/62 (!) 183/81 (!) 167/82 (!) 158/78  Pulse: 64 68 68   Resp: 18 18    Temp: 98.3 F (36.8 C) 97.6 F (36.4 C)    TempSrc: Oral Oral    SpO2: 96% 97% 98%   Weight:  81.1 kg (178 lb 11.2 oz)    Height:        Intake/Output Summary (Last 24 hours) at 02/19/17 1149 Last data filed at 02/19/17 0837  Gross per 24 hour  Intake              460 ml  Output                0 ml  Net              460 ml   Filed Weights   02/17/17 0500 02/17/17 2207 02/19/17 0629  Weight: 83.9 kg (184 lb 15.5 oz) 83 kg (183 lb) 81.1 kg (178 lb 11.2 oz)    General appearance: Awake, alert. In no distress Resp: Clear to auscultation bilaterally Cardio: S1, S2 is normal, regular GI: Abdomen remains soft. Nontender. Nondistended. No masses or organomegaly. Bowel sounds are present and normal. Extremities:  No edema Neurologic: No focal deficits  Lab Results:  Data Reviewed: I have personally reviewed following labs and imaging studies  CBC:  Recent Labs Lab 02/14/17 0513 02/15/17 0529 02/16/17 0325 02/17/17 0507 02/18/17 0655  WBC 7.8 12.3* 10.0 10.5 11.3*  HGB 9.4* 9.8* 9.5* 9.5* 9.9*  HCT 28.3* 30.1* 29.4* 28.5* 29.7*  MCV 86.8 85.0 85.5 84.6 84.9  PLT 333 380 370 358 417*    Basic Metabolic Panel:  Recent Labs Lab 02/14/17 0513 02/15/17 0529 02/16/17 0325 02/17/17 0507 02/18/17 0655  NA 138 138 134* 134* 135  K 3.4* 3.3* 3.3* 3.2* 3.3*  CL 108 107 103 101 100*  CO2 20* 20* 21*  23 25  GLUCOSE 130* 168* 110* 108* 126*  BUN 71* 66* 61* 59* 57*  CREATININE 4.10* 3.79* 3.51* 3.64* 3.66*  CALCIUM 10.0 10.2 9.8 9.8 9.9    GFR: Estimated Creatinine Clearance: 14 mL/min (A) (by C-G formula based on SCr of 3.66 mg/dL (H)).  Liver Function Tests:  Recent Labs Lab 02/15/17 0529  AST 24  ALT 33  ALKPHOS 239*  BILITOT 0.9  PROT 5.9*  ALBUMIN 2.6*    CBG:  Recent Labs Lab 02/18/17 1156 02/18/17 1656 02/18/17 2249 02/19/17 0734 02/19/17 1121  GLUCAP 175* 145* 152* 129* 228*     Recent Results (from the past 240 hour(s))  Culture, blood (Routine X 2) w Reflex to ID Panel     Status: None   Collection Time: 02/09/17  3:40 PM  Result Value Ref Range Status   Specimen Description BLOOD RIGHT ANTECUBITAL  Final   Special Requests   Final    BOTTLES DRAWN AEROBIC AND ANAEROBIC Blood Culture adequate volume   Culture NO GROWTH 5 DAYS  Final   Report Status 02/14/2017 FINAL  Final  Culture, blood (Routine X 2) w Reflex to ID Panel     Status: None   Collection Time: 02/09/17  6:35 PM  Result Value Ref Range Status   Specimen Description BLOOD RIGHT HAND  Final   Special Requests   Final    BOTTLES DRAWN AEROBIC AND ANAEROBIC Blood Culture results may not be optimal due to an inadequate volume of blood received in culture bottles   Culture NO GROWTH 5 DAYS  Final   Report Status 02/14/2017 FINAL  Final  MRSA PCR Screening     Status: None   Collection Time: 02/15/17  2:25 PM  Result Value Ref Range Status   MRSA by PCR NEGATIVE NEGATIVE Final    Comment:        The GeneXpert MRSA Assay (FDA approved for NASAL specimens only), is one component of a comprehensive MRSA colonization surveillance program. It is not intended to diagnose MRSA infection nor to guide or monitor treatment for MRSA infections.       Radiology Studies: No results found.   Medications:  Scheduled: . amoxicillin-clavulanate  1 tablet Oral BID  . aspirin  325 mg Oral  Daily  . carvedilol  6.25 mg Oral BID WC  . clonazepam  0.25 mg Oral QHS  . feeding supplement  1 Container Oral BID  . feeding supplement (PRO-STAT SUGAR FREE 64)  30 mL Oral BID  . fentaNYL  12.5 mcg Transdermal Q72H  . furosemide  80 mg Oral BID  . hydrALAZINE  50 mg Oral Q8H  . insulin aspart  0-15 Units Subcutaneous TID WC  . insulin aspart  0-5 Units Subcutaneous QHS  . insulin glargine  5 Units  Subcutaneous QHS  . latanoprost  1 drop Both Eyes QPM  . LORazepam  0.26 mg Oral Daily  . pantoprazole  40 mg Oral Q1200  . polyethylene glycol  17 g Oral Daily  . protein supplement shake  11 oz Oral BID  . sodium chloride flush  3 mL Intravenous Q12H  . timolol  1 drop Both Eyes BID   Continuous: . sodium chloride     HCW:CBJSEG chloride, acetaminophen, antiseptic oral rinse, diphenhydrAMINE, glycopyrrolate **OR** glycopyrrolate **OR** glycopyrrolate, haloperidol **OR** haloperidol **OR** haloperidol lactate, hydrocortisone, LORazepam, morphine injection, ondansetron **OR** ondansetron (ZOFRAN) IV, oxyCODONE, pentafluoroprop-tetrafluoroeth, polyvinyl alcohol, sodium chloride flush, sodium phosphate, traMADol  Assessment/Plan:  Principal Problem:   Diverticulitis Active Problems:   Hypertension   Bipolar 1 disorder ([facility])   Morbid obesity (BMI 39.21)    Depression, major, in partial remission ([facility])   CKD (chronic kidney disease) stage 3, GFR 30-59 ml/min   Hypothyroidism   T2_NIDDM   Anemia   Congestive heart failure (CHF) ([facility])   Perforation and abscess of large intestine concurrent with and due to diverticulitis   Palliative care encounter   Goals of care, counseling/discussion   DNR (do not resuscitate)   SOB (shortness of breath)   NSTEMI (non-ST elevated myocardial infarction) ([place])    Acute diverticulitis with perforation and intra-abdominal abscess Patient was hospitalized and started on IV antibiotics. Seen by general surgery. Patient had been improving. Her WBC  had improved to normal. She was changed over to oral antibiotics. However, WBC noted to be elevated on 7/27. Free air noted on chest x-ray. CT scan was done which shows pneumoperitoneum along with concern for bowel perforation and abscess. Surgical intervention was recommended by surgery. However, patient has been refusing this intervention. She understands the risks of not undergoing surgery. She is concerned about the possible complications and prolonged recovery after surgery. She knows about the possibility of death if her infection were to get worse. Palliative medicine was consulted. They are in discussions with the patient and family regarding transitioning to hospice. Patient was transitioned over to Augmentin yesterday after discussions with general surgery, palliative medicine and patient. All labs were discontinued. She seems to be comfortable at this time. Daughter inquiring about repeating CT scan. She was told that this will not add any value at this time and will not change management. I would be reluctant to subject her to another CT scan. They are also inquire about altering her pain medications. I would defer this to palliative medicine.  Chest Pain/Unstable Angina Vs NSTEMI Patient was found to have mildly elevated troponin along with new EKG changes. Patient seen by cardiology. Echocardiogram revealed normal systolic function with grade 1 diastolic dysfunction. She was placed on IV heparin and was also briefly on IV nitroglycerin. Chest pain has resolved. EKG with stable changes. Cardiology is following. Heparin was discontinued after 72 hours. No plan for any invasive testing as there is a risk of worsening her renal function. Patient has refused surgery for perforated diverticulitis and intra-abdominal abscess at this time. Transitioning to hospice.  Normocytic anemia Hemoglobin is stable. No evidence for overt bleeding.  Chronic kidney disease stage III-hypokalemia Creatinine close to  baseline. Patient was recently hospitalized in July for syncope thought to be due to aggressive diuresis. Her dose of Lasix was reduced to 80 twice a day. She was initially kept off of Lasix. This was subsequently resumed. Labs have been discontinued.   Chronic diastolic CHF. Seems euvolemic currently. Continue Lasix.  History  of essential hypertension. Stable.  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 Continue to monitor CBGs. Continue Lantus. Holding her oral agents. CBGs are reasonably well controlled.  Hypothyroidism. She was on levothyroxin. Appears to have been discontinued.   DVT Prophylaxis: SCDs    Code Status: Full code  Family Communication: Discussed with patient and daughter. Disposition Plan: Management as outlined above.    LOS: 10 days   [facility] Pager [phone] 02/19/2017, 11:49 AM  If 7PM-7AM, please contact night-coverage at [email or web address], password [facility]

## 2017-02-19 NOTE — Progress Notes (Signed)
Patient without complaint on 7 a to 7 p shift, took some Tramadol in the morning for abdominal pain, wearing Fentanyl patch but deferred any additional pain medication for the rest of the shift.  Patient walks in the room and hallways with walker and standby assist only.  Eating full liquids, per Dr. Rosendo Gros note can be advanced as she tolerates but she is scared to try anything behind full liquids.  Patient drank 2 [employer] during shift and blood sugar climbed to greater than 300, refuses all other supplements.  Daughter at bedside all shift.

## 2017-02-19 NOTE — Care Management Important Message (Signed)
Important Message  Patient Details  Name: Alyssa [name] MRN: [id number] Date of Birth: 12/19/1945   Medicare Important Message Given:  Yes    Orbie Pyo 02/19/2017, 11:41 AM

## 2017-02-20 ENCOUNTER — Inpatient Hospital Stay (HOSPITAL_COMMUNITY): Payer: Medicare Other

## 2017-02-20 ENCOUNTER — Encounter (HOSPITAL_COMMUNITY): Payer: Self-pay | Admitting: *Deleted

## 2017-02-20 LAB — COMPREHENSIVE METABOLIC PANEL
ALT: 12 U/L — AB (ref 14–54)
ANION GAP: 11 (ref 5–15)
AST: 14 U/L — ABNORMAL LOW (ref 15–41)
Albumin: 2.5 g/dL — ABNORMAL LOW (ref 3.5–5.0)
Alkaline Phosphatase: 117 U/L (ref 38–126)
BUN: 51 mg/dL — AB (ref 6–20)
CHLORIDE: 97 mmol/L — AB (ref 101–111)
CO2: 27 mmol/L (ref 22–32)
CREATININE: 3.4 mg/dL — AB (ref 0.44–1.00)
Calcium: 9.8 mg/dL (ref 8.9–10.3)
GFR calc non Af Amer: 13 mL/min — ABNORMAL LOW (ref >60)
GFR, EST AFRICAN AMERICAN: 14 mL/min — AB (ref >60)
GLUCOSE: 154 mg/dL — AB (ref 65–99)
Potassium: 3.3 mmol/L — ABNORMAL LOW (ref 3.5–5.1)
SODIUM: 135 mmol/L (ref 135–145)
Total Bilirubin: 0.6 mg/dL (ref 0.3–1.2)
Total Protein: 5.7 g/dL — ABNORMAL LOW (ref 6.5–8.1)

## 2017-02-20 LAB — GLUCOSE, CAPILLARY
GLUCOSE-CAPILLARY: 145 mg/dL — AB (ref 65–99)
Glucose-Capillary: 168 mg/dL — ABNORMAL HIGH (ref 65–99)
Glucose-Capillary: 126 mg/dL — ABNORMAL HIGH (ref 65–99)
Glucose-Capillary: 149 mg/dL — ABNORMAL HIGH (ref 65–99)

## 2017-02-20 LAB — CBC
HCT: 29.3 % — ABNORMAL LOW (ref 36.0–46.0)
Hemoglobin: 9.9 g/dL — ABNORMAL LOW (ref 12.0–15.0)
MCH: 28.8 pg (ref 26.0–34.0)
MCHC: 33.8 g/dL (ref 30.0–36.0)
MCV: 85.2 fL (ref 78.0–100.0)
PLATELETS: 430 10*3/uL — AB (ref 150–400)
RBC: 3.44 MIL/uL — AB (ref 3.87–5.11)
RDW: 15.1 % (ref 11.5–15.5)
WBC: 12.4 10*3/uL — ABNORMAL HIGH (ref 4.0–10.5)

## 2017-02-20 MED ORDER — LORAZEPAM 2 MG/ML PO CONC
0.5000 mg | ORAL | Status: DC | PRN
  Administered 2017-02-20 (×2): 0.5 mg via ORAL
  Filled 2017-02-20 (×2): qty 1

## 2017-02-20 MED ORDER — LORAZEPAM 2 MG/ML PO CONC: 0.2500 mg | Freq: Every day | ORAL | Status: DC

## 2017-02-20 MED ORDER — IOPAMIDOL (ISOVUE-300) INJECTION 61%
INTRAVENOUS | Status: AC
  Filled 2017-02-20: qty 50

## 2017-02-20 MED ORDER — FUROSEMIDE 80 MG PO TABS
80.0000 mg | ORAL_TABLET | Freq: Two times a day (BID) | ORAL | Status: DC
  Administered 2017-02-21 – 2017-02-22 (×3): 80 mg via ORAL
  Filled 2017-02-20 (×3): qty 1

## 2017-02-20 MED ORDER — CLONAZEPAM 0.5 MG PO TABS
0.5000 mg | ORAL_TABLET | Freq: Every day | ORAL | Status: DC
  Administered 2017-02-21 – 2017-02-22 (×2): 0.5 mg via ORAL
  Filled 2017-02-20 (×2): qty 1

## 2017-02-20 MED ORDER — HYDROMORPHONE HCL 1 MG/ML PO LIQD
5.0000 mg | ORAL | Status: DC | PRN
  Administered 2017-02-20 (×2): 5 mg via ORAL
  Filled 2017-02-20 (×5): qty 5

## 2017-02-20 MED ORDER — IOPAMIDOL (ISOVUE-300) INJECTION 61%
INTRAVENOUS | Status: AC
  Filled 2017-02-20: qty 30

## 2017-02-20 MED ORDER — FENTANYL 50 MCG/HR TD PT72: 50.0000 ug | MEDICATED_PATCH | TRANSDERMAL | Status: DC

## 2017-02-20 MED ORDER — CLONAZEPAM 0.125 MG PO TBDP
1.0000 mg | ORAL_TABLET | Freq: Every day | ORAL | Status: DC
Start: 2017-02-20 — End: 2017-02-22
  Administered 2017-02-20 – 2017-02-21 (×2): 1 mg via ORAL
  Filled 2017-02-20 (×2): qty 8

## 2017-02-20 MED ORDER — FENTANYL 50 MCG/HR TD PT72
50.0000 ug | MEDICATED_PATCH | TRANSDERMAL | Status: DC
  Administered 2017-02-20: 50 ug via TRANSDERMAL
  Filled 2017-02-20: qty 1

## 2017-02-20 NOTE — Progress Notes (Signed)
Pt offered purwick and/or foley catheter for comfort. Pt ademently refused both. Susie Cassette RN

## 2017-02-20 NOTE — Progress Notes (Addendum)
Daily Progress Note   Patient Name: Alyssa [name]       Date: 02/20/2017 DOB: 08-15-1945  Age: 71 y.o. MRN#: [id number] Attending Physician: Hosie Poisson, MD Primary Care Physician: Haywood Pao, MD Admit Date: 02/09/2017  Reason for Consultation/Follow-up: Establishing goals of care, Non pain symptom management, Pain control and Psychosocial/spiritual support  Subjective: Family called stating that the patient had taken a turn for the worse today - they reported she is freezing code, and in distress.   Before going to see [name] I discussed her CT scan with Dr. Nevada Crane of radiology.  She has two significant fluid collections that were previous perforations.  They have not quite walled off yet.  But her free air has improved. Given that her labs show minimal changes as well - the patient picture is not matching the data.  On exam the patient is moaning in pain and very frustrated/angry.  She yells at me.  I asked if she was having more emotional pain or physical pain and she replied both.  On exam she states her abdomen is non-tender to palpation, she has good bowel sound and the abdomen is soft.    Her daughter in the room is on the verge of tears.   I feel this may be more emotional pain.   Assessment: 71 yo female with a history of bipolar 1 having a very emotionally difficult hospital stay.  Initially she was told by all providers that she was very likely to die due to bowel perforation.  She refused surgery and reported she never wants HD.  She improved and was able to walk the hallways with a rolling walker and consume full liquids.  The quandary shifted to disposition.  Home with care or SNF or [place].  When the patient thought she was dying she decided to go to hospice house.     Today - the patient feels awful and her data is improved (labs generally stable, CT somewhat improved).  Given her advanced kidney disease and multiple fluid collections in her abdomen -  this infection will at some point take her life.  The question is when.    Patient Profile/HPI: 71 y.o. female  with past medical history of chronic back pain, chronic kidney disease, and congestive heart failure who was admitted on  02/09/2017 with diverticulitis with suspected microperforation.  Additionally she had elevated troponin and acute on chronic renal failure.  She was seen by surgery who offered surgery.  She was seen by cardiology who felt she likely had some type of ischemic injury but felt that due to her kidney disease she was not a candidate for a cardiac cath.  Further Cardiology states that she was at high risk for a perioperative cardiac event.  Shortly before I met the patient she made up her mind not to have surgery.   Length of Stay: 11  Current Medications: Scheduled Meds:  . amoxicillin-clavulanate  1 tablet Oral BID  . aspirin  325 mg Oral Daily  . carvedilol  6.25 mg Oral BID WC  . clonazepam  1 mg Oral QHS  . feeding supplement (PRO-STAT SUGAR FREE 64)  30 mL Oral BID  . [START ON 02/21/2017] fentaNYL  50 mcg Transdermal Q72H  . furosemide  80 mg Oral BID  . hydrALAZINE  50 mg Oral Q8H  . insulin aspart  0-15 Units Subcutaneous TID WC  . insulin aspart  0-5 Units Subcutaneous QHS  . insulin glargine  5 Units Subcutaneous QHS  . iopamidol      . iopamidol      . latanoprost  1 drop Both Eyes QPM  . LORazepam  0.26 mg Oral Daily  . pantoprazole  40 mg Oral Q1200  . polyethylene glycol  17 g Oral Daily  . protein supplement shake  11 oz Oral BID  . sodium chloride flush  3 mL Intravenous Q12H  . timolol  1 drop Both Eyes BID    Continuous Infusions: . sodium chloride      PRN Meds: sodium chloride, acetaminophen, antiseptic oral rinse, diphenhydrAMINE, glycopyrrolate  **OR** glycopyrrolate **OR** glycopyrrolate, haloperidol **OR** haloperidol **OR** haloperidol lactate, hydrocortisone, HYDROmorphone HCl, LORazepam, ondansetron **OR** ondansetron (ZOFRAN) IV, pentafluoroprop-tetrafluoroeth, polyvinyl alcohol, sodium chloride flush, sodium phosphate  Physical Exam        Well developed female.  Ill appearing.  Angry/frustrated/in distress moaning and yelling.  Sitting up in her recliner chair tripoding on the arm rests CV rrr resp cta Abdomen soft, nt, nd, +bs   Vital Signs: BP (!) 160/73 (BP Location: Right Arm)   Pulse 64   Temp (!) 97.4 F (36.3 C) (Oral)   Resp 18   Ht '5\' 3"'$  (1.6 m)   Wt 80.2 kg (176 lb 11.2 oz)   SpO2 100%   BMI 31.30 kg/m  SpO2: SpO2: 100 % O2 Device: O2 Device: Not Delivered O2 Flow Rate:    Intake/output summary:  Intake/Output Summary (Last 24 hours) at 02/20/17 1611 Last data filed at 02/20/17 0200  Gross per 24 hour  Intake              720 ml  Output                0 ml  Net              720 ml   LBM: Last BM Date: 02/12/17 Baseline Weight: Weight: 79.4 kg (175 lb) Most recent weight: Weight: 80.2 kg (176 lb 11.2 oz)       Palliative Assessment/Data:      Patient Active Problem List   Diagnosis Date Noted  . SOB (shortness of breath)   . NSTEMI (non-ST elevated myocardial infarction) ([place])   . Perforation and abscess of large intestine concurrent with and due to diverticulitis   . Palliative care  encounter   . Goals of care, counseling/discussion   . DNR (do not resuscitate)   . Diverticulitis 02/09/2017  . Syncope and collapse 02/01/2017  . Syncope 02/01/2017  . 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 Pleural effusion on right 01/24/2017  . Acute renal failure with acute tubular necrosis superimposed on stage 3 chronic kidney disease ([place])   . Acute on chronic diastolic CHF (congestive heart failure) ([place])   . Abnormal liver function   . Anemia 01/04/2017  . Congestive heart  failure (CHF) ([place]) 01/04/2017  . T2_NIDDM 03/31/2015  . Actinic keratitis 02/23/2015  . Encounter for Medicare annual wellness exam 02/09/2015  . Medication management 07/14/2014  . Hypothyroidism 07/14/2014  . CKD (chronic kidney disease) stage 3, GFR 30-59 ml/min 06/24/2014  . Gout 06/24/2014  . Acute diastolic congestive heart failure ([place]) 10/13/2013  . Morbid obesity (BMI 39.21)  07/15/2013  . Depression, major, in partial remission ([place]) 07/15/2013  . Vitamin D deficiency   . Osteoporosis   . 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 Hypertension   . Bipolar 1 disorder ([place])   . Hyperlipidemia     Palliative Care Plan    Recommendations/Plan: Patient wants to live as long as possible as well as possible without surgery or hemodialysis. Comfort is her priority.  She has a low pain threshold. She insisted her IV access be removed.   She has had a difficult stay due to fall precautions   For existential pain and anxiety:  Klonopin 1 mg QHS and 0.5 mg q am.     PRN ativan for break thru anxiety symptoms.  For physical pain (back, abdomen, leg) Fentanyl patch 50 mcg.    (started at 12.5 -> 25 -> now 50 mcg)  PRN dilaudid solution for break thru pain. (patient does not do well with oxy or morphine)  I will restart her full liquid diet.  Patient needs discharge ASAP when her symptoms are managed.  She is emotionally fried  Recommend home with Hospice services when symptoms are stable/managed.  Family may well need additional privately hired support.   Patient refuses SNF.  Goals of Care and Additional Recommendations:  Limitations on Scope of Treatment: Full Comfort Care, Minimize Medications, No IV Fluids and No Lab Draws  Code Status:  DNR  Prognosis:   < 6 weeks given fluid collections in her abdomen, advanced kidney disease and desire for comfort.   Discharge Planning:  Home with Hospice.  If she declines suddenly she will likely be eligible for hospice house.  Care plan was  discussed with [facility] attending physician  Thank you for allowing the Palliative Medicine Team to assist in the care of this patient.  Total time spent:  60 min.     Greater than 50%  of this time was spent counseling and coordinating care related to the above assessment and plan.  Florentina Jenny, PA-C Palliative Medicine  Please contact Palliative MedicineTeam phone at [phone] for questions and concerns between 7 am - 7 pm.   Please see AMION for individual provider pager numbers.

## 2017-02-20 NOTE — Progress Notes (Signed)
PT Cancellation Note  Patient Details Name: KEYLEN UZELAC MRN: [id number] DOB: [birth year]   Cancelled Treatment:    Reason Eval/Treat Not Completed: Other (comment) Upon entry, pt's daughter shaking head no to PT.  Pt having to drink contrast and states she does not want PT and won't want it later today.  Will check back tomorrow.   Galen Manila 02/20/2017, 12:31 PM

## 2017-02-20 NOTE — Progress Notes (Signed)
PT Cancellation Note  Patient Details Name: Alyssa [name] MRN: [id number] DOB: May 27, 1946   Cancelled Treatment:    Reason Eval/Treat Not Completed: Patient declined citing a poor night of sleep and needing to rest.  Will check back later.  Spoke with mobility tech who reports pt did walk with him yesterday.   Galen Manila 02/20/2017, 9:45 AM

## 2017-02-20 NOTE — Progress Notes (Signed)
PROGRESS NOTE    Alyssa [name]  WTU:[id number] DOB: 1946-06-09 DOA: 02/09/2017 PCP: Haywood Pao, MD    Brief Narrative: 71 year old female with a past medical history of bipolar disorder, type 2 diabetes, chronic back pain, chronic kidney disease stage III, presented with abdominal pain. Was found to have diverticulitis with microperforation. She started improving. On the morning of 7/27 she complained of chest pain. EKG showed new changes. Cardiology was consulted. CT scan of the abdomen was repeated and showed worsening findings. Patient reevaluated by general surgery and they offered her surgical intervention, which she has declined so far. Subsequently palliative medicine was consulted by general surgery. She was transitioned to oral antibiotics. Depending on how she does over the next 1-2 days, she could be transitioned over to hospice.  Assessment & Plan:   Principal Problem:   Diverticulitis Active Problems:   Hypertension   Bipolar 1 disorder (HCC)   Morbid obesity (BMI 39.21)    Depression, major, in partial remission (HCC)   CKD (chronic kidney disease) stage 3, GFR 30-59 ml/min   Hypothyroidism   T2_NIDDM   Anemia   Congestive heart failure (CHF) (HCC)   Perforation and abscess of large intestine concurrent with and due to diverticulitis   Palliative care encounter   Goals of care, counseling/discussion   DNR (do not resuscitate)   SOB (shortness of breath)   NSTEMI (non-ST elevated myocardial infarction) ([place])  Acute diverticulosis with intra abdominal abscess and perforation: Started on IV antibiotics and transitioned to oral antibiotics to complete the course.  Surgery recommended surgical intervention as she has free air but patient and family refused and are looking forward to home with hospice.  Palliative care consulted and pending recommendations.  She was ordered a repeat CT ABD without IV contrast for further evaluation.  Pain control by palliative  medicine team.    NSTEMI:  Elevated troponins with EKG changes.  Cardiology consulted and recommendations given.  No plan for any invasive testing at this time for fear of worsening renal function. Conservative management.  Transition to hospice as per palliative care team.    Normocytic anemia:  No evidence of bleeding, hemoglobin stable around 9.   Stage 3 CKD:  PT is adamant about not doing labs.  Creatinine is stable around 3.4   Hypertension:  Stable.    Diabetes mellitus:  CBG (last 3)   Recent Labs  02/19/17 2223 02/20/17 0728 02/20/17 1141  GLUCAP 124* 145* 168*    Resume SSI.  Holding oral agents.    Hypothyroidism:      DVT prophylaxis: scd's Code Status: (DNR Family Communication:DISCUSSED THE PLAN WITH PT AND DAUGHTER AT BEDSIDE.  Disposition Plan: eventually home with hospice .   Consultants:   Palliative care  Cardiology.   gen surgery   Procedures: CT abdomen without contrast.  Echocardiogram.    Antimicrobials: Augmentin.    Subjective: Complaints of multiple blood draws and tests. Reports multiple episodes of diarrhea.   Objective: Vitals:   02/19/17 1534 02/19/17 1936 02/20/17 0514 02/20/17 1254  BP: 132/72 (!) 180/89 (!) 178/72 (!) 160/73  Pulse:  73 65 64  Resp:  20 20 18   Temp:  98.1 F (36.7 C) 98.3 F (36.8 C) (!) 97.4 F (36.3 C)  TempSrc:  Oral Oral Oral  SpO2:  99% 97% 100%  Weight:   80.2 kg (176 lb 11.2 oz)   Height:        Intake/Output Summary (Last 24 hours) at 02/20/17 1357  Last data filed at 02/20/17 0200  Gross per 24 hour  Intake              720 ml  Output                0 ml  Net              720 ml   Filed Weights   02/17/17 2207 02/19/17 0629 02/20/17 0514  Weight: 83 kg (183 lb) 81.1 kg (178 lb 11.2 oz) 80.2 kg (176 lb 11.2 oz)    Examination:  General exam: Appears frustrated, angry.  Respiratory system: Clear to auscultation. Respiratory effort normal. Cardiovascular system:  S1 & S2 heard, RRR. No JVD, murmurs, rubs, gallops or clicks.  Gastrointestinal system: Abdomen is nondistended, soft and generalized tenderness. No organomegaly or masses felt. Normal bowel sounds heard. Central nervous system: Alert and oriented. No focal neurological deficits. Extremities: Symmetric 5 x 5 power. Skin: No rashes, lesions or ulcers Psychiatry: anxious, angry and frustrated.     Data Reviewed: I have personally reviewed following labs and imaging studies  CBC:  Recent Labs Lab 02/15/17 0529 02/16/17 0325 02/17/17 0507 02/18/17 0655 02/20/17 0710  WBC 12.3* 10.0 10.5 11.3* 12.4*  HGB 9.8* 9.5* 9.5* 9.9* 9.9*  HCT 30.1* 29.4* 28.5* 29.7* 29.3*  MCV 85.0 85.5 84.6 84.9 85.2  PLT 380 370 358 417* 203*   Basic Metabolic Panel:  Recent Labs Lab 02/15/17 0529 02/16/17 0325 02/17/17 0507 02/18/17 0655 02/20/17 0710  NA 138 134* 134* 135 135  K 3.3* 3.3* 3.2* 3.3* 3.3*  CL 107 103 101 100* 97*  CO2 20* 21* 23 25 27   GLUCOSE 168* 110* 108* 126* 154*  BUN 66* 61* 59* 57* 51*  CREATININE 3.79* 3.51* 3.64* 3.66* 3.40*  CALCIUM 10.2 9.8 9.8 9.9 9.8   GFR: Estimated Creatinine Clearance: 15 mL/min (A) (by C-G formula based on SCr of 3.4 mg/dL (H)). Liver Function Tests:  Recent Labs Lab 02/15/17 0529 02/20/17 0710  AST 24 14*  ALT 33 12*  ALKPHOS 239* 117  BILITOT 0.9 0.6  PROT 5.9* 5.7*  ALBUMIN 2.6* 2.5*   No results for input(s): LIPASE, AMYLASE in the last 168 hours. No results for input(s): AMMONIA in the last 168 hours. Coagulation Profile: No results for input(s): INR, PROTIME in the last 168 hours. Cardiac Enzymes:  Recent Labs Lab 02/15/17 0529 02/15/17 1201 02/15/17 1912  TROPONINI 0.08* 0.06* 0.04*   BNP (last 3 results) No results for input(s): PROBNP in the last 8760 hours. HbA1C: No results for input(s): HGBA1C in the last 72 hours. CBG:  Recent Labs Lab 02/19/17 1638 02/19/17 2110 02/19/17 2223 02/20/17 0728  02/20/17 1141  GLUCAP 309* 74 124* 145* 168*   Lipid Profile: No results for input(s): CHOL, HDL, LDLCALC, TRIG, CHOLHDL, LDLDIRECT in the last 72 hours. Thyroid Function Tests: No results for input(s): TSH, T4TOTAL, FREET4, T3FREE, THYROIDAB in the last 72 hours. Anemia Panel: No results for input(s): VITAMINB12, FOLATE, FERRITIN, TIBC, IRON, RETICCTPCT in the last 72 hours. Sepsis Labs: No results for input(s): PROCALCITON, LATICACIDVEN in the last 168 hours.  Recent Results (from the past 240 hour(s))  MRSA PCR Screening     Status: None   Collection Time: 02/15/17  2:25 PM  Result Value Ref Range Status   MRSA by PCR NEGATIVE NEGATIVE Final    Comment:        The GeneXpert MRSA Assay (FDA approved for NASAL specimens only), is  one component of a comprehensive MRSA colonization surveillance program. It is not intended to diagnose MRSA infection nor to guide or monitor treatment for MRSA infections.          Radiology Studies: No results found.      Scheduled Meds: . amoxicillin-clavulanate  1 tablet Oral BID  . aspirin  325 mg Oral Daily  . carvedilol  6.25 mg Oral BID WC  . clonazepam  0.25 mg Oral QHS  . feeding supplement  1 Container Oral BID  . feeding supplement (PRO-STAT SUGAR FREE 64)  30 mL Oral BID  . [START ON 02/21/2017] fentaNYL  25 mcg Transdermal Q72H  . furosemide  80 mg Oral BID  . hydrALAZINE  50 mg Oral Q8H  . insulin aspart  0-15 Units Subcutaneous TID WC  . insulin aspart  0-5 Units Subcutaneous QHS  . insulin glargine  5 Units Subcutaneous QHS  . iopamidol      . iopamidol      . latanoprost  1 drop Both Eyes QPM  . LORazepam  0.26 mg Oral Daily  . pantoprazole  40 mg Oral Q1200  . polyethylene glycol  17 g Oral Daily  . protein supplement shake  11 oz Oral BID  . sodium chloride flush  3 mL Intravenous Q12H  . timolol  1 drop Both Eyes BID   Continuous Infusions: . sodium chloride       LOS: 11 days    Time spent: 45  minutes.     Hosie Poisson, MD [facility] Pager [phone]   If 7PM-7AM, please contact night-coverage [email or web address] Password [email or web address] 02/20/2017, 1:57 PM

## 2017-02-21 DIAGNOSIS — R627 Adult failure to thrive: Secondary | ICD-10-CM

## 2017-02-21 LAB — GLUCOSE, CAPILLARY
GLUCOSE-CAPILLARY: 158 mg/dL — AB (ref 65–99)
GLUCOSE-CAPILLARY: 247 mg/dL — AB (ref 65–99)
Glucose-Capillary: 131 mg/dL — ABNORMAL HIGH (ref 65–99)
Glucose-Capillary: 275 mg/dL — ABNORMAL HIGH (ref 65–99)

## 2017-02-21 MED ORDER — FENTANYL 50 MCG/HR TD PT72: 50.0000 ug | MEDICATED_PATCH | TRANSDERMAL | 0 refills | Status: AC

## 2017-02-21 MED ORDER — AMOXICILLIN-POT CLAVULANATE 500-125 MG PO TABS: 1.0000 | ORAL_TABLET | Freq: Two times a day (BID) | ORAL | 0 refills | Status: AC

## 2017-02-21 MED ORDER — LOPERAMIDE HCL 2 MG PO CAPS
2.0000 mg | ORAL_CAPSULE | ORAL | Status: DC | PRN
  Administered 2017-02-21: 2 mg via ORAL
  Filled 2017-02-21: qty 1

## 2017-02-21 MED ORDER — ONDANSETRON HCL 4 MG PO TABS: 4.0000 mg | ORAL_TABLET | Freq: Four times a day (QID) | ORAL | 0 refills | Status: AC | PRN

## 2017-02-21 MED ORDER — CLONAZEPAM 1 MG PO TBDP: 1.0000 mg | ORAL_TABLET | Freq: Every day | ORAL | 0 refills | Status: AC

## 2017-02-21 MED ORDER — PRO-STAT SUGAR FREE PO LIQD: 30.0000 mL | Freq: Two times a day (BID) | ORAL | 0 refills | Status: AC

## 2017-02-21 MED ORDER — HYDROCORTISONE 2.5 % RE CREA: TOPICAL_CREAM | RECTAL | 0 refills | Status: AC | PRN

## 2017-02-21 MED ORDER — CLONAZEPAM 0.5 MG PO TABS: 0.5000 mg | ORAL_TABLET | Freq: Every day | ORAL | 0 refills | Status: AC

## 2017-02-21 MED ORDER — LOPERAMIDE HCL 2 MG PO CAPS: 2.0000 mg | ORAL_CAPSULE | ORAL | 0 refills | Status: AC | PRN

## 2017-02-21 MED ORDER — HYDROMORPHONE HCL 1 MG/ML PO LIQD: 5.0000 mg | ORAL | 0 refills | Status: AC | PRN

## 2017-02-21 MED ORDER — PANTOPRAZOLE SODIUM 40 MG PO TBEC: 40.0000 mg | DELAYED_RELEASE_TABLET | Freq: Every day | ORAL | 0 refills | Status: AC

## 2017-02-21 NOTE — Care Management Important Message (Signed)
Important Message  Patient Details  Name: Alyssa [name] MRN: [id number] Date of Birth: 1946/03/05   Medicare Important Message Given:  Yes    Nathen May 02/21/2017, 10:13 AM

## 2017-02-21 NOTE — Care Management Note (Signed)
Case Management Note  Patient Details  Name: Alyssa [name] MRN: [id number] Date of Birth: 1945-12-02  Action/Plan: CM talked to patient with her daughter present for Home Hospice choices, they chose [facility] [place]; referral made as requested  Expected Discharge Date:   possibly 02/21/2017               Expected Discharge Plan:  Home w Hospice Care  In-House Referral:   Palliative Care  Discharge planning Services  CM Consult  Choice offered to:  Patient, Adult Children  [place]:  [facility] [place]  Status of Service:  In process, will continue to follow  Sherrilyn Rist [phone] 02/21/2017, 10:56 AM

## 2017-02-21 NOTE — Progress Notes (Signed)
[facility]) Hospital Liaison:  RN visit  Notified by Hassan Rowan [facility], of patient/family request for [facility] services at home after discharge.  Chart and patient information are under review by [facility] physician.  Hospice eligibility pending at this time.  Writer spoke with patient and daughter, Amy, at bedside to initiate education related to hospice philosophy, services and team approach to care.  Patient/family verbalized understanding of the information given.  Per discussion, plan is for discharge home 02/21/17 or 02/22/17 to home by private vehicle.  Please send signed and completed DNR form home with patient/family.  Patient will need prescriptions for discharge comfort medications.   DME needs have been discussed, patient currently has the following equipment in her home:  Stair lift and walker.  Patient/family have declined further DME needs at this time.    [facility] aware of the above.  Completed discharge summary will need to be faxed to [facility] at [phone] when final.  Please notify [facility] when patient is ready to leave the unit at discharge.  Call [phone] or [phone] after 5pm.  [facility] information and contact numbers given to Amy, daughter, during this visit.  Above information will be shared with Hassan Rowan, [facility].  Please call with any hospice related questions.  Thank you for the referral.  Edyth Gunnels, RN, BSN [facility] Liaison [phone]  All hospital liaisons are now on [place].

## 2017-02-21 NOTE — Progress Notes (Signed)
Daily Progress Note   Patient Name: Alyssa [name]       Date: 02/21/2017 DOB: Jan 31, 1946  Age: 71 y.o. MRN#: [id number] Attending Physician: Hosie Poisson, MD Primary Care Physician: Haywood Pao, MD Admit Date: 02/09/2017  Reason for Consultation/Follow-up: Establishing goals of care and Psychosocial/spiritual support  Subjective: Meeting patient for the first time for hospice vs SNF vs home. Goals of care previously solidified. Patient sitting in bedside chair with daughter present in room. She voices frustration with the fluidity of her care thus far. When discussing discharge planning, the patient stated that she did not want to return home " but what else am I going to do?" She stated her husband had died 7 months before, and that she lives alone with her cat. She states that it is now emotionally difficult to be home alone, especially at night. She further states she does not wish to go to a SNF. The daughter has offered for her to live with her once a home remodel is completed, in apporx 3 weeks per the daughter. The patient is amenable to discharge home with initiation of a out of pocket care giver until the daughter is able to take her to live at her residence.       Length of Stay: 12  Current Medications: Scheduled Meds:  . amoxicillin-clavulanate  1 tablet Oral BID  . aspirin  325 mg Oral Daily  . carvedilol  6.25 mg Oral BID WC  . clonazepam  1 mg Oral QHS  . clonazePAM  0.5 mg Oral Daily  . feeding supplement (PRO-STAT SUGAR FREE 64)  30 mL Oral BID  . fentaNYL  50 mcg Transdermal Q72H  . furosemide  80 mg Oral BID  . hydrALAZINE  50 mg Oral Q8H  . insulin aspart  0-15 Units Subcutaneous TID WC  . insulin aspart  0-5 Units Subcutaneous QHS  . insulin glargine  5 Units  Subcutaneous QHS  . latanoprost  1 drop Both Eyes QPM  . pantoprazole  40 mg Oral Q1200  . protein supplement shake  11 oz Oral BID  . sodium chloride flush  3 mL Intravenous Q12H  . timolol  1 drop Both Eyes BID    Continuous Infusions: . sodium chloride      PRN Meds: sodium chloride, acetaminophen, antiseptic  oral rinse, diphenhydrAMINE, hydrocortisone, HYDROmorphone HCl, loperamide, ondansetron **OR** ondansetron (ZOFRAN) IV, pentafluoroprop-tetrafluoroeth, polyvinyl alcohol, sodium chloride flush, sodium phosphate  Physical Exam  Constitutional: She is oriented to person, place, and time. She appears well-developed.  Eyes: EOM are normal.  Pulmonary/Chest: Effort normal.  Musculoskeletal:  No deformities noted.   Neurological: She is alert and oriented to person, place, and time.  Skin: Skin is warm and dry.  Psychiatric: Thought content normal.            Vital Signs: BP (!) 117/49 (BP Location: Right Arm)   Pulse (!) 58   Temp 98 F (36.7 C) (Oral)   Resp 20   Ht 5\' 3"  (1.6 m)   Wt 80.2 kg (176 lb 11.2 oz)   SpO2 96%   BMI 31.30 kg/m  SpO2: SpO2: 96 % O2 Device: O2 Device: Not Delivered O2 Flow Rate:    Intake/output summary:  Intake/Output Summary (Last 24 hours) at 02/21/17 1553 Last data filed at 02/20/17 1700  Gross per 24 hour  Intake              220 ml  Output                0 ml  Net              220 ml   LBM: Last BM Date: 02/21/17 Baseline Weight: Weight: 79.4 kg (175 lb) Most recent weight: Weight: 80.2 kg (176 lb 11.2 oz)       Palliative Assessment/Data: 50%     Patient Active Problem List   Diagnosis Date Noted  . SOB (shortness of breath)   . NSTEMI (non-ST elevated myocardial infarction) ([place])   . Perforation and abscess of large intestine concurrent with and due to diverticulitis   . Palliative care encounter   . Goals of care, counseling/discussion   . DNR (do not resuscitate)   . Diverticulitis 02/09/2017  . Syncope and  collapse 02/01/2017  . Syncope 02/01/2017  . 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 Pleural effusion on right 01/24/2017  . Acute renal failure with acute tubular necrosis superimposed on stage 3 chronic kidney disease ([place])   . Acute on chronic diastolic CHF (congestive heart failure) ([place])   . Abnormal liver function   . Anemia 01/04/2017  . Congestive heart failure (CHF) ([place]) 01/04/2017  . T2_NIDDM 03/31/2015  . Actinic keratitis 02/23/2015  . Encounter for Medicare annual wellness exam 02/09/2015  . Medication management 07/14/2014  . Hypothyroidism 07/14/2014  . CKD (chronic kidney disease) stage 3, GFR 30-59 ml/min 06/24/2014  . Gout 06/24/2014  . Acute diastolic congestive heart failure ([place]) 10/13/2013  . Morbid obesity (BMI 39.21)  07/15/2013  . Depression, major, in partial remission ([place]) 07/15/2013  . Vitamin D deficiency   . Osteoporosis   . 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 Hypertension   . Bipolar 1 disorder ([place])   . Hyperlipidemia     Palliative Care Assessment & Plan   Assessment: Patient is frustrated with fluidity of care plans thus far. She was faced with immanent death, decisions were made to decline life prolonging interventions. However over the last week, she has remained stable to improving, and her disposition options have been limited. She faces emotional, existential, spiritual, and physical crises and fear of anticipatory care needs. This is a difficult situation for Alyssa [name].   Recommendations/Plan:  At her guidance, she wants to go home with out of pocket care giver until daughter's home  can be moved into.   Goals of Care and Additional Recommendations:  Focus on comfort and dignity.  Code Status:    Code Status Orders        Start     Ordered   02/18/17 1451  Do not attempt resuscitation (DNR)  Continuous    Question Answer Comment  In the event of cardiac or respiratory ARREST Do not call a "code blue"   In the event of  cardiac or respiratory ARREST Do not perform Intubation, CPR, defibrillation or ACLS   In the event of cardiac or respiratory ARREST Use medication by any route, position, wound care, and other measures to relive pain and suffering. May use oxygen, suction and manual treatment of airway obstruction as needed for comfort.      02/18/17 1450    Code Status History    Date Active Date Inactive Code Status Order ID Comments User Context   02/17/2017  1:57 PM 02/18/2017  2:50 PM DNR [id number]  Fuller Canada, PA-C Inpatient   02/09/2017  2:32 PM 02/17/2017  1:57 PM Full Code [id number]  Radene Gunning, NP ED   02/01/2017  2:06 PM 02/02/2017  5:55 PM Full Code [id number]  Rondel Jumbo, PA-C ED   01/04/2017 11:30 PM 01/12/2017  3:46 PM Full Code [id number]  Benito Mccreedy, MD Inpatient   10/12/2013  6:29 PM 10/16/2013 10:32 PM Full Code [id number]  Charlynne Cousins, MD Inpatient       Prognosis:   Unable to determine -Patient appears to be improving.   Discharge Planning:  Home with hospice  Care plan was discussed with Dr. Karleen Hampshire and case management.    Thank you for allowing the Palliative Medicine Team to assist in the care of this patient.   Time In: 10:30 Time Out: 11:30 Total Time 60 Prolonged Time Billed  no       Greater than 50%  of this time was spent counseling and coordinating care related to the above assessment and plan.  Asencion Gowda, NP/ Wadie Lessen, NP  Please contact Palliative Medicine Team phone at [phone] for questions and concerns.

## 2017-02-21 NOTE — Progress Notes (Signed)
PROGRESS NOTE    Alyssa [name]  JZP:[id number] DOB: 1945-10-02 DOA: 02/09/2017 PCP: Haywood Pao, MD    Brief Narrative: 71 year old female with a past medical history of bipolar disorder, type 2 diabetes, chronic back pain, chronic kidney disease stage III, presented with abdominal pain. Was found to have diverticulitis with microperforation. She started improving. On the morning of 7/27 she complained of chest pain. EKG showed new changes. Cardiology was consulted. CT scan of the abdomen was repeated and showed worsening findings. Patient reevaluated by general surgery and they offered her surgical intervention, which she has declined so far. Subsequently palliative medicine was consulted by general surgery. She was transitioned to oral antibiotics.  Transitioned to hospice for comfort and dignity as per the patient's and daughter's wishes.   Assessment & Plan:   Principal Problem:   Diverticulitis Active Problems:   Hypertension   Bipolar 1 disorder (HCC)   Morbid obesity (BMI 39.21)    Depression, major, in partial remission (HCC)   CKD (chronic kidney disease) stage 3, GFR 30-59 ml/min   Hypothyroidism   T2_NIDDM   Anemia   Congestive heart failure (CHF) (HCC)   Perforation and abscess of large intestine concurrent with and due to diverticulitis   Palliative care encounter   Goals of care, counseling/discussion   DNR (do not resuscitate)   SOB (shortness of breath)   NSTEMI (non-ST elevated myocardial infarction) ([place])   Adult failure to thrive  Acute diverticulosis with intra abdominal abscess and perforation: Started on IV antibiotics and transitioned to oral antibiotics to complete the course.  Surgery recommended surgical intervention as she has free air but patient and family refused and are looking forward to home with hospice.  Palliative care consulted and recommendations given.  Pain control by palliative medicine team.    NSTEMI:  Elevated troponins with  EKG changes.  Cardiology consulted and recommendations given.  No plan for any invasive testing at this time for fear of worsening renal function. Conservative management.  Transition to hospice as per palliative care team.    Normocytic anemia:  No evidence of bleeding, hemoglobin stable around 9.   Stage 3 CKD:  PT is adamant about not doing labs.  Creatinine is stable around 3.4   Hypertension:  Stable.    Diabetes mellitus:  CBG (last 3)   Recent Labs  02/21/17 0725 02/21/17 1107 02/21/17 1645  GLUCAP 131* 275* 158*    Holding checking cbg's for now.    Hypothyroidism:   Recurrent episodes of diarrhea int he last 1 to2 days. Possibly from oral contrast.  - ordered imodium and add on lomotil if needed.      DVT prophylaxis: scd's Code Status: (DNR Family Communication:DISCUSSED THE PLAN WITH PT AND DAUGHTER AT BEDSIDE.  Disposition Plan: home with home hospice in am  .   Consultants:   Palliative care  Cardiology.   gen surgery   Procedures: CT abdomen without contrast.  Echocardiogram.    Antimicrobials: Augmentin.    Subjective: Diarrhea hasn't improved.   Objective: Vitals:   02/20/17 1254 02/20/17 1953 02/21/17 1013 02/21/17 1109  BP: (!) 160/73 129/62 (!) 140/56 (!) 117/49  Pulse: 64 70 65 (!) 58  Resp: 18 20  20   Temp: (!) 97.4 F (36.3 C) 98 F (36.7 C)  98 F (36.7 C)  TempSrc: Oral Oral  Oral  SpO2: 100% 96%  96%  Weight:      Height:       No intake or  output data in the 24 hours ending 02/21/17 1706 Filed Weights   02/17/17 2207 02/19/17 0629 02/20/17 0514  Weight: 83 kg (183 lb) 81.1 kg (178 lb 11.2 oz) 80.2 kg (176 lb 11.2 oz)    Examination:  General exam: comfortable. But appears exhausted  Respiratory system: Clear to auscultation. Respiratory effort normal. No wheezing or rhonchi.  Cardiovascular system: S1 & S2 heard, RRR. No JVD, murmurs, rubs, gallops or clicks.  Gastrointestinal system: Abdomen is  nondistended, soft and generalized tenderness in the abdomen. . No organomegaly or masses felt. Normal bowel sounds heard. Central nervous system: Alert and oriented. No focal neurological deficits. Extremities: Symmetric 5 x 5 power. Skin: No rashes, lesions or ulcers Psychiatry: depressed affect.     Data Reviewed: I have personally reviewed following labs and imaging studies  CBC:  Recent Labs Lab 02/15/17 0529 02/16/17 0325 02/17/17 0507 02/18/17 0655 02/20/17 0710  WBC 12.3* 10.0 10.5 11.3* 12.4*  HGB 9.8* 9.5* 9.5* 9.9* 9.9*  HCT 30.1* 29.4* 28.5* 29.7* 29.3*  MCV 85.0 85.5 84.6 84.9 85.2  PLT 380 370 358 417* 151*   Basic Metabolic Panel:  Recent Labs Lab 02/15/17 0529 02/16/17 0325 02/17/17 0507 02/18/17 0655 02/20/17 0710  NA 138 134* 134* 135 135  K 3.3* 3.3* 3.2* 3.3* 3.3*  CL 107 103 101 100* 97*  CO2 20* 21* 23 25 27   GLUCOSE 168* 110* 108* 126* 154*  BUN 66* 61* 59* 57* 51*  CREATININE 3.79* 3.51* 3.64* 3.66* 3.40*  CALCIUM 10.2 9.8 9.8 9.9 9.8   GFR: Estimated Creatinine Clearance: 15 mL/min (A) (by C-G formula based on SCr of 3.4 mg/dL (H)). Liver Function Tests:  Recent Labs Lab 02/15/17 0529 02/20/17 0710  AST 24 14*  ALT 33 12*  ALKPHOS 239* 117  BILITOT 0.9 0.6  PROT 5.9* 5.7*  ALBUMIN 2.6* 2.5*   No results for input(s): LIPASE, AMYLASE in the last 168 hours. No results for input(s): AMMONIA in the last 168 hours. Coagulation Profile: No results for input(s): INR, PROTIME in the last 168 hours. Cardiac Enzymes:  Recent Labs Lab 02/15/17 0529 02/15/17 1201 02/15/17 1912  TROPONINI 0.08* 0.06* 0.04*   BNP (last 3 results) No results for input(s): PROBNP in the last 8760 hours. HbA1C: No results for input(s): HGBA1C in the last 72 hours. CBG:  Recent Labs Lab 02/20/17 1639 02/20/17 2140 02/21/17 0725 02/21/17 1107 02/21/17 1645  GLUCAP 126* 149* 131* 275* 158*   Lipid Profile: No results for input(s): CHOL, HDL,  LDLCALC, TRIG, CHOLHDL, LDLDIRECT in the last 72 hours. Thyroid Function Tests: No results for input(s): TSH, T4TOTAL, FREET4, T3FREE, THYROIDAB in the last 72 hours. Anemia Panel: No results for input(s): VITAMINB12, FOLATE, FERRITIN, TIBC, IRON, RETICCTPCT in the last 72 hours. Sepsis Labs: No results for input(s): PROCALCITON, LATICACIDVEN in the last 168 hours.  Recent Results (from the past 240 hour(s))  MRSA PCR Screening     Status: None   Collection Time: 02/15/17  2:25 PM  Result Value Ref Range Status   MRSA by PCR NEGATIVE NEGATIVE Final    Comment:        The GeneXpert MRSA Assay (FDA approved for NASAL specimens only), is one component of a comprehensive MRSA colonization surveillance program. It is not intended to diagnose MRSA infection nor to guide or monitor treatment for MRSA infections.          Radiology Studies: Ct Abdomen Pelvis Wo Contrast  Result Date: 02/20/2017 CLINICAL DATA:  71 year old female status post bowel perforation on July 21st, initially contained but with a small volume of pneumoperitoneum follow-up CT on on 02/15/2017. Managed non-operatively. Underlying diverticulosis and renal insufficiency. EXAM: CT ABDOMEN AND PELVIS WITHOUT CONTRAST TECHNIQUE: Multidetector CT imaging of the abdomen and pelvis was performed following the standard protocol without IV contrast. COMPARISON:  CT Abdomen and Pelvis 02/15/2017, 02/09/2017, 10/05/2014 FINDINGS: Lower chest: Decrease cardiomegaly since 02/15/2017. No pericardial effusion. Stable small layering left pleural effusion. No other lung base opacity. Severe calcified coronary artery atherosclerosis. Hepatobiliary: Stable small volume of free fluid about the right liver contour. Stable noncontrast liver and gallbladder, with continued mild to moderate gallbladder distention and probable cholelithiasis. Pancreas: Negative. Spleen: Negative. Adrenals/Urinary Tract: Normal adrenal glands. Stable noncontrast  appearance of both kidneys. Mild perinephric stranding, but no hydronephrosis or hydroureter. There is retroperitoneal inflammation along the course of the left ureter related to the sigmoid colon. Diminutive urinary bladder, which does not appear directly involved by the sigmoid inflammation. Stomach/Bowel: Extraluminal air in fluid collection situated between the uterine fundus and distal small bowel loops persists measuring 43 x 36 by 42 mm today (AP by transverse by CC), stable. The extraluminal gas collection posterior to the uterine fundus persists measuring 21 x 32 by 33 mm today, stable. Intervening fibroid uterus with coarse dystrophic calcifications. The left lateral margin of the uterus is inseparable from the sigmoid colon which appears indistinct throughout its course, with severe diverticulosis. No bowel obstruction, but there is luminal narrowing in the sigmoid beginning at the junction with the descending colon on series 3, image 66. Stable rectum. Stable descending colon, with moderate to severe diverticulosis, and no active inflammation other than at the junction of the descending and sigmoid colon. Stable transverse colon. Stable right colon. No dilated small bowel. Oral contrast has reached the distal large bowel today. The stomach is largely decompressed. There is a small duodenum diverticulum. The duodenum is otherwise within normal limits. No residual pneumoperitoneum. Vascular/Lymphatic: Severe to use calcified atherosclerosis of all major vessels throughout the abdomen and pelvis. Vascular patency is not evaluated in the absence of IV contrast. Reproductive: Normal left ovary. The right is poorly delineated. The uterus is described above. Other: Stable mild presacral stranding.  No pelvic free fluid. Musculoskeletal: Previous lumbar fusion. No acute osseous abnormality identified. IMPRESSION: 1. Resolved free intraperitoneal air since 02/15/2017. 2. Two contained bowel perforations as seen  on 07/27 are stable and are situated between the sigmoid colon, the distal small bowel mesentery, and the uterus. Continued inflammation in and about the sigmoid colon compatible with acute diverticulitis/colitis. Probable inflammatory adhesion of the sigmoid colon to the uterus. 3. Stable small left pleural effusion and perihepatic free fluid. 4. No new abnormality identified in the lower chest, abdomen, or pelvis. 5. Severe generalized calcified atherosclerosis, including involvement of the aorta and coronary arteries. Salient findings on this exam were discussed by telephone with Provider Florentina Jenny on 02/20/2017 at 15:32 . Electronically Signed   By: Genevie Ann M.D.   On: 02/20/2017 15:39        Scheduled Meds: . amoxicillin-clavulanate  1 tablet Oral BID  . aspirin  325 mg Oral Daily  . carvedilol  6.25 mg Oral BID WC  . clonazepam  1 mg Oral QHS  . clonazePAM  0.5 mg Oral Daily  . feeding supplement (PRO-STAT SUGAR FREE 64)  30 mL Oral BID  . fentaNYL  50 mcg Transdermal Q72H  . furosemide  80 mg Oral BID  .  hydrALAZINE  50 mg Oral Q8H  . insulin aspart  0-15 Units Subcutaneous TID WC  . insulin aspart  0-5 Units Subcutaneous QHS  . insulin glargine  5 Units Subcutaneous QHS  . latanoprost  1 drop Both Eyes QPM  . pantoprazole  40 mg Oral Q1200  . protein supplement shake  11 oz Oral BID  . sodium chloride flush  3 mL Intravenous Q12H  . timolol  1 drop Both Eyes BID   Continuous Infusions: . sodium chloride       LOS: 12 days    Time spent: 45 minutes.     Hosie Poisson, MD [facility] Pager [phone]   If 7PM-7AM, please contact night-coverage [email or web address] Password [email or web address] 02/21/2017, 5:06 PM

## 2017-02-22 DIAGNOSIS — K5720 Diverticulitis of large intestine with perforation and abscess without bleeding: Principal | ICD-10-CM

## 2017-02-22 LAB — GLUCOSE, CAPILLARY
GLUCOSE-CAPILLARY: 251 mg/dL — AB (ref 65–99)
Glucose-Capillary: 162 mg/dL — ABNORMAL HIGH (ref 65–99)

## 2017-02-22 MED ORDER — HYDROMORPHONE HCL 1 MG/ML PO LIQD: 2.0000 mg | ORAL | Status: DC | PRN

## 2017-02-22 NOTE — Progress Notes (Signed)
CM called [facility] for discharge home today as requested; Aneta Mins [phone]

## 2017-02-22 NOTE — Progress Notes (Signed)
[facility] Liaison:   Spoke with Hassan Rowan, [facility] who states patient is currently being discharged to home.    Thank you,  [facility] Liaison  [phone]    All hospital liaisons are on [place].

## 2017-02-22 NOTE — Care Management Important Message (Signed)
Important Message  Patient Details  Name: Alyssa [name] MRN: [id number] Date of Birth: Sep 06, 1945   Medicare Important Message Given:  Yes    Orbie Pyo 02/22/2017, 12:45 PM

## 2017-02-22 NOTE — Progress Notes (Signed)
Discharged to home. D/c instructions and follow up appointments discussed with patient. Daughter at bedside.

## 2017-02-22 NOTE — Progress Notes (Signed)
Prior to discharge, RD provided written and verbal diet education to pt and her daughter. Reviewed current [place] diet, typical progression to Soft diet as tolerated. Encouraged pt to introduce 1 new food at a time. Provided education for nutritional supplementation. Also provided high calorie, high protein recipes. All questions answered at that time. Pt discharged to home  [facility] [place], [place], [place] [phone] Pager  [phone] Weekend/On-Call Pager

## 2017-02-25 NOTE — Discharge Summary (Signed)
Physician Discharge Summary  Alyssa [name] DIB:[id number] DOB: 06/17/1946 DOA: 02/09/2017  PCP: Gaspar Garbe, MD  Admit date: 02/09/2017 Discharge date: 02/22/2017  Admitted From: Home.  Disposition:  *Home hospice.   Recommendations for Outpatient Follow-up:  1. Follow up with PCP in 1-2 weeks 2. Please obtain BMP/CBC in one week 3. Please follow up with hospice MD as needed.     Discharge Condition: hospice.  CODE STATUS:DNR Diet recommendation: Heart Healthy  Brief/Interim Summary: 71 year old female with a past medical history of bipolar disorder, type 2 diabetes, chronic back pain, chronic kidney disease stage III, presented with abdominal pain. Was found to have diverticulitis with microperforation. She started improving. On the morning of 7/27 she complained of chest pain. EKG showed new changes. Cardiology was consulted. CT scan of the abdomen was repeated and showed worsening findings. Patient reevaluated by general surgery and they offered her surgical intervention, which she has declined so far. Subsequently palliative medicine was consulted by general surgery. She was transitioned to oral antibiotics.  Transitioned to hospice for comfort and dignity as per the patient's and daughter's wishes.   Discharge Diagnoses:  Principal Problem:   Diverticulitis Active Problems:   Hypertension   Bipolar 1 disorder (HCC)   Morbid obesity (BMI 39.21)    Depression, major, in partial remission (HCC)   CKD (chronic kidney disease) stage 3, GFR 30-59 ml/min   Hypothyroidism   T2_NIDDM   Anemia   Congestive heart failure (CHF) (HCC)   Perforation and abscess of large intestine concurrent with and due to diverticulitis   Palliative care encounter   Goals of care, counseling/discussion   DNR (do not resuscitate)   SOB (shortness of breath)   NSTEMI (non-ST elevated myocardial infarction) (HCC)   Adult failure to thrive  Acute diverticulosis with intra abdominal abscess and  perforation: Started on IV antibiotics and transitioned to oral antibiotics to complete the course.  Surgery recommended surgical intervention as she has free air but patient and family refused and are looking forward to home with hospice.  Palliative care consulted and recommendations given.  Pain control by palliative medicine team.    NSTEMI:  Elevated troponins with EKG changes.  Cardiology consulted and recommendations given.  No plan for any invasive testing at this time for fear of worsening renal function. Conservative management.  Transition to hospice as per palliative care team.    Normocytic anemia:  No evidence of bleeding, hemoglobin stable around 9.   Stage 3 CKD:  PT is adamant about not doing labs.  Creatinine is stable around 3.4   Hypertension:  Stable.    Diabetes mellitus:   no checkign cbg's as per patient.    Recurrent episodes of diarrhea int he last 1 to2 days. Possibly from oral contrast.  - ordered imodium and add on lomotil if needed.  Her diarrhea improved.      Discharge Instructions  Discharge Instructions    Diet - low sodium heart healthy    Complete by:  As directed    Discharge instructions    Complete by:  As directed    Home with home hospice.  Please follow up with hospice MD as needed.     Allergies as of 02/22/2017      Reactions   Ciprofloxacin Hcl Diarrhea   Cymbalta [duloxetine Hcl] Other (See Comments)   "Ineffective"   Lipitor [atorvastatin] Other (See Comments)   Makes the patient "not feel right"   Xanax [alprazolam] Other (See Comments)  Excessive sleepiness   Codeine    Reaction not recalled   Lortab [hydrocodone-acetaminophen] Other (See Comments)   Reaction not recalled   Oxycodone Other (See Comments)   Reaction not recalled       Medication List    STOP taking these medications   aspirin 81 MG tablet   docusate sodium 100 MG capsule Commonly known as:  COLACE   ferrous sulfate 325  (65 FE) MG tablet   Fish Oil 1000 MG Caps   freestyle lancets   glipiZIDE 5 MG tablet Commonly known as:  GLUCOTROL   isosorbide-hydrALAZINE 20-37.5 MG tablet Commonly known as:  BIDIL   levothyroxine 75 MCG tablet Commonly known as:  SYNTHROID, LEVOTHROID   polyethylene glycol packet Commonly known as:  MIRALAX / GLYCOLAX     TAKE these medications   ACCU-CHEK AVIVA PLUS test strip Generic drug:  glucose blood USE TO TEST BLOOD SUGAR THREE TIMES DAILY   ACCU-CHEK AVIVA PLUS w/Device Kit   acetaminophen 650 MG CR tablet Commonly known as:  TYLENOL Take 1,300-1,950 mg by mouth 2 (two) times daily as needed for pain.   amoxicillin-clavulanate 500-125 MG tablet Commonly known as:  AUGMENTIN Take 1 tablet (500 mg total) by mouth 2 (two) times daily.   carvedilol 3.125 MG tablet Commonly known as:  COREG Take 1 tablet (3.125 mg total) by mouth 2 (two) times daily with a meal.   clonazePAM 1 MG disintegrating tablet Commonly known as:  KLONOPIN Take 1 tablet (1 mg total) by mouth at bedtime.   clonazePAM 0.5 MG tablet Commonly known as:  KLONOPIN Take 1 tablet (0.5 mg total) by mouth daily.   feeding supplement (PRO-STAT SUGAR FREE 64) Liqd Take 30 mLs by mouth 2 (two) times daily.   fentaNYL 50 MCG/HR Commonly known as:  DURAGESIC - dosed mcg/hr Place 1 patch (50 mcg total) onto the skin every 3 (three) days.   furosemide 40 MG tablet Commonly known as:  LASIX Take 2 tablets (80 mg total) by mouth 2 (two) times daily.   hydrocortisone 2.5 % rectal cream Commonly known as:  ANUSOL-HC Place rectally as needed for hemorrhoids or anal itching.   HYDROmorphone HCl 1 MG/ML Liqd Commonly known as:  DILAUDID Take 5 mLs (5 mg total) by mouth every 2 (two) hours as needed for severe pain.   insulin glargine 100 UNIT/ML injection Commonly known as:  LANTUS Inject 0.05 mLs (5 Units total) into the skin at bedtime.   latanoprost 0.005 % ophthalmic solution Commonly  known as:  XALATAN Place 1 drop into both eyes every evening.   loperamide 2 MG capsule Commonly known as:  IMODIUM Take 1 capsule (2 mg total) by mouth as needed for diarrhea or loose stools.   loratadine 10 MG tablet Commonly known as:  CLARITIN Take 10 mg by mouth every evening.   minoxidil 10 MG tablet Commonly known as:  LONITEN TAKE 1/2 TO 1 TABLET BY MOUTH DAILY AS DIRECTED FOR BLOOD PRESSURE What changed:  See the new instructions.   multivitamin tablet Take 1 tablet by mouth daily.   ondansetron 4 MG tablet Commonly known as:  ZOFRAN Take 1 tablet (4 mg total) by mouth every 6 (six) hours as needed for nausea.   pantoprazole 40 MG tablet Commonly known as:  PROTONIX Take 1 tablet (40 mg total) by mouth daily at 12 noon.   timolol 0.5 % ophthalmic solution Commonly known as:  TIMOPTIC INSTILL 1 DROP IN BOTH EYES TWICE DAILY  VITAMIN B 12 PO Take 1,000 mcg by mouth every morning.   VITAMIN D-3 PO Take 5,000 Units by mouth every evening.      Follow-up Information    [facility] Follow up.   Specialty:  Hospice and Palliative Medicine Why:  They will do your home hospice at your home Contact information: [place] [address] [phone]        Tisovec, Fransico Him, MD. Schedule an appointment as soon as possible for a visit in 1 week.   Specialty:  Internal Medicine Why:  Message left with Dr Osborne Casco secretary to call and make appt for patient Contact information: [address] [place] [address] [phone]          Allergies  Allergen Reactions  . Ciprofloxacin Hcl Diarrhea  . Cymbalta [Duloxetine Hcl] Other (See Comments)    "Ineffective"  . Lipitor [Atorvastatin] Other (See Comments)    Makes the patient "not feel right"  . Xanax [Alprazolam] Other (See Comments)    Excessive sleepiness  . Codeine     Reaction not recalled  . Lortab [Hydrocodone-Acetaminophen] Other (See Comments)    Reaction  not recalled  . Oxycodone Other (See Comments)    Reaction not recalled     Consultations:  Palliative care.   Nephrology  cardiology   Procedures/Studies: Ct Abdomen Pelvis Wo Contrast  Result Date: 02/20/2017 CLINICAL DATA:  71 year old female status post bowel perforation on July 21st, initially contained but with a small volume of pneumoperitoneum follow-up CT on on 02/15/2017. Managed non-operatively. Underlying diverticulosis and renal insufficiency. EXAM: CT ABDOMEN AND PELVIS WITHOUT CONTRAST TECHNIQUE: Multidetector CT imaging of the abdomen and pelvis was performed following the standard protocol without IV contrast. COMPARISON:  CT Abdomen and Pelvis 02/15/2017, 02/09/2017, 10/05/2014 FINDINGS: Lower chest: Decrease cardiomegaly since 02/15/2017. No pericardial effusion. Stable small layering left pleural effusion. No other lung base opacity. Severe calcified coronary artery atherosclerosis. Hepatobiliary: Stable small volume of free fluid about the right liver contour. Stable noncontrast liver and gallbladder, with continued mild to moderate gallbladder distention and probable cholelithiasis. Pancreas: Negative. Spleen: Negative. Adrenals/Urinary Tract: Normal adrenal glands. Stable noncontrast appearance of both kidneys. Mild perinephric stranding, but no hydronephrosis or hydroureter. There is retroperitoneal inflammation along the course of the left ureter related to the sigmoid colon. Diminutive urinary bladder, which does not appear directly involved by the sigmoid inflammation. Stomach/Bowel: Extraluminal air in fluid collection situated between the uterine fundus and distal small bowel loops persists measuring 43 x 36 by 42 mm today (AP by transverse by CC), stable. The extraluminal gas collection posterior to the uterine fundus persists measuring 21 x 32 by 33 mm today, stable. Intervening fibroid uterus with coarse dystrophic calcifications. The left lateral margin of the uterus  is inseparable from the sigmoid colon which appears indistinct throughout its course, with severe diverticulosis. No bowel obstruction, but there is luminal narrowing in the sigmoid beginning at the junction with the descending colon on series 3, image 66. Stable rectum. Stable descending colon, with moderate to severe diverticulosis, and no active inflammation other than at the junction of the descending and sigmoid colon. Stable transverse colon. Stable right colon. No dilated small bowel. Oral contrast has reached the distal large bowel today. The stomach is largely decompressed. There is a small duodenum diverticulum. The duodenum is otherwise within normal limits. No residual pneumoperitoneum. Vascular/Lymphatic: Severe to use calcified atherosclerosis of all major vessels throughout the abdomen and pelvis. Vascular patency is not evaluated  in the absence of IV contrast. Reproductive: Normal left ovary. The right is poorly delineated. The uterus is described above. Other: Stable mild presacral stranding.  No pelvic free fluid. Musculoskeletal: Previous lumbar fusion. No acute osseous abnormality identified. IMPRESSION: 1. Resolved free intraperitoneal air since 02/15/2017. 2. Two contained bowel perforations as seen on 07/27 are stable and are situated between the sigmoid colon, the distal small bowel mesentery, and the uterus. Continued inflammation in and about the sigmoid colon compatible with acute diverticulitis/colitis. Probable inflammatory adhesion of the sigmoid colon to the uterus. 3. Stable small left pleural effusion and perihepatic free fluid. 4. No new abnormality identified in the lower chest, abdomen, or pelvis. 5. Severe generalized calcified atherosclerosis, including involvement of the aorta and coronary arteries. Salient findings on this exam were discussed by telephone with Provider Florentina Jenny on 02/20/2017 at 15:32 . Electronically Signed   By: Genevie Ann M.D.   On: 02/20/2017 15:39    Ct Abdomen Pelvis Wo Contrast  Result Date: 02/15/2017 CLINICAL DATA:  Possible free-air on radiograph EXAM: CT ABDOMEN AND PELVIS WITHOUT CONTRAST TECHNIQUE: Multidetector CT imaging of the abdomen and pelvis was performed following the standard protocol without IV contrast. COMPARISON:  Radiograph 02/15/2017, CT 02/09/2017 FINDINGS: Lower chest: Small left pleural effusion and trace right pleural effusion. Patchy consolidation in the left greater than right lung bases, likely atelectasis. Cardiomegaly with coronary artery calcification. Small pericardial effusion. Hepatobiliary: No focal hepatic abnormality. Dilated gallbladder, measuring up to 4.2 cm. Punctate stones present in the gallbladder. No biliary dilatation. Pancreas: Unremarkable. No pancreatic ductal dilatation or surrounding inflammatory changes. Spleen: Normal in size without focal abnormality. Adrenals/Urinary Tract: Adrenal glands are within normal limits. No hydronephrosis. Probable intrarenal vascular calcifications. Bladder unremarkable. Stomach/Bowel: The stomach is nonenlarged. No dilated small bowel. Sigmoid colon diverticular disease with wall thickening and surrounding inflammation consistent with diverticulitis. Increased size of a focal gas collection posterior to the uterus, measuring 4.6 by 1.8 cm. Interim development of gas and fluid collection anterior to the uterus measuring 4.2 by 4.2 cm, suspicious for an abscess between small bowel loops. The appendix is visualized and appears within normal limits. Vascular/Lymphatic: Aortic atherosclerosis. Scattered retroperitoneal lymph nodes. Reproductive: Large calcified masses in the uterus consistent with fibroids. No adnexal masses. Other: Small amount of free fluid in the right upper quadrant. Multiple pockets of free air in the upper abdomen, dominant air collection in the anterior abdomen. Musculoskeletal: Postsurgical changes at L4-L5 with anterolisthesis of L4 on L5. IMPRESSION:  1. Pneumoperitoneum, consistent with hollow viscus perforation. Likely source is the rectosigmoid colon where there are findings consistent with diverticulitis. Increased gas and small fluid collection posterior to the uterus, now measuring 4.6 cm suspicious for focal perforation or possible fistula. Interim development of gas and fluid collection anterior to the uterine fundus measuring 4.2 cm, suspicious for an abscess. 2. Negative for bowel obstruction 3. Dilated gallbladder containing small calcified stones 4. Cardiomegaly.  Trace pleural effusions and bibasilar atelectasis. Critical Value/emergent results were called by telephone at the time of interpretation on 02/15/2017 at 2:33 pm to Dr. Bonnielee Haff, who verbally acknowledged these results. Electronically Signed   By: Donavan Foil M.D.   On: 02/15/2017 14:34   Ct Abdomen Pelvis Wo Contrast  Result Date: 02/09/2017 CLINICAL DATA:  Cramping abdominal pain since 0900 hours, constipation for 7 days, no relief with laxatives, diabetes mellitus, hypertension, abnormal renal function with low GFR EXAM: CT ABDOMEN AND PELVIS WITHOUT CONTRAST TECHNIQUE: Multidetector CT imaging of the  abdomen and pelvis was performed following the standard protocol without IV contrast. Sagittal and coronal MPR images reconstructed from axial data set. Oral contrast was not administered. COMPARISON:  10/05/2014 FINDINGS: Lower chest: Lung bases clear Hepatobiliary: Distended gallbladder with dependent calculi. No definite pericholecystic infiltration. Liver unremarkable. Pancreas: Atrophic pancreas without mass Spleen: Normal appearance Adrenals/Urinary Tract: Adrenal thickening without discrete mass. Unremarkable kidneys without mass or hydronephrosis. No ureteral calcification or dilatation definitely visualized. Bladder contains only a small amount of urine. Stomach/Bowel: Normal appendix. Diffuse colonic diverticulosis. Wall thickening of sigmoid colon with adjacent  infiltrative changes suspicious for acute diverticulitis. Inflammatory changes extend adjacent to the uterus. No abscess. Small collection of gas is seen posterior to the uterus on the RIGHT could potentially represent extraluminal gas arising from the sigmoid colon inflammatory process. Stomach and remaining bowel loops unremarkable. No abnormal retained stool burden identified. Vascular/Lymphatic: Extensive atherosclerotic calcifications aorta and abdominal vessels without aneurysm. Coronary arterial calcifications. Minimal pericardial fluid. Reproductive: Calcified leiomyomata within uterus. Ovaries unremarkable. Other: No free intraperitoneal air or fluid.  No hernia. Musculoskeletal: Osseous demineralization with prior lower lumbar fusion. IMPRESSION: Diffuse colonic diverticulosis with sigmoid wall thickening and pericolic and treat changes consistent with acute diverticulitis. Edematous changes extend adjacent to the uterus with a small focal gas collection is seen posterior to the uterus, could represent extraluminal gas arising from perforated sigmoid diverticulitis but no abscess collection is identified. Calcified uterine leiomyomata. Aortic Atherosclerosis (ICD10-I70.0). Electronically Signed   By: Lavonia Dana M.D.   On: 02/09/2017 13:46   Dg Chest 2 View  Result Date: 02/15/2017 CLINICAL DATA:  Shortness of breath.  Chest pain . EXAM: CHEST  2 VIEW COMPARISON:  02/09/2017 . FINDINGS: Cardiomegaly. No evidence of overt pulmonary edema. Mild basilar Basilar atelectasis. Small left pleural effusion cannot be excluded. Free air under the left hemidiaphragm versus gastric distention cannot be excluded. Abdominal series suggested for further evaluation . IMPRESSION: 1. Free air under the left hemidiaphragm versus gastric distention cannot be excluded. Abdominal series suggest for further evaluation. 2. Cardiomegaly. No evidence of overt congestive heart failure . Mild left base atelectasis. Small left  pleural effusion. Critical Value/emergent results were called by telephone at the time of interpretation on 02/15/2017 at 8:54 am to nurse Arbie Cookey, who verbally acknowledged these results. Electronically Signed   By: Marcello Moores  Register   On: 02/15/2017 08:57   Dg Chest 2 View  Result Date: 02/01/2017 CLINICAL DATA:  witnessed syncopal episode while at renal office. Pt was seated, did not fall; out < 30 seconds; per EMS, pt was pale VSS, no orthostatic changes; CBG: 232 Pt was admitted x 1 week ago for low hemoglobin. EXAM: CHEST - 2 VIEW COMPARISON:  01/10/2017 FINDINGS: Lungs are clear. Heart size upper limits normal.  Atheromatous aorta. No effusion.  No pneumothorax. Degenerative changes in bilateral shoulders. Anterior vertebral endplate spurring at multiple levels in the mid and lower thoracic spine. IMPRESSION: Mild cardiomegaly.  No acute findings. Electronically Signed   By: Lucrezia Europe M.D.   On: 02/01/2017 12:28   Ct Head Wo Contrast  Result Date: 02/01/2017 CLINICAL DATA:  Syncope EXAM: CT HEAD WITHOUT CONTRAST TECHNIQUE: Contiguous axial images were obtained from the base of the skull through the vertex without intravenous contrast. COMPARISON:  Brain MRI and January 13, 2014 and head CT January 04, 2017 FINDINGS: Brain: The ventricles are normal in size and configuration. There is moderate frontal atrophy bilaterally, more severe on the left than on the right, stable. There  is also moderate temporal lobe atrophy bilaterally, stable. There is no intracranial mass, hemorrhage, extra-axial fluid collection, or midline shift. There is patchy small vessel disease in the centra semiovale bilaterally. Elsewhere gray-white compartments appear normal. No acute infarct evident. Vascular: No hyperdense vessel. There is calcification in each distal vertebral artery as well as in each carotid siphon region. Skull: The bony calvarium appears intact. Sinuses/Orbits: There is mucosal thickening in several ethmoid air cells  bilaterally. Visualized orbits appear symmetric bilaterally. Other: Mastoid air cells are clear. IMPRESSION: Stable atrophy with patchy supratentorial small vessel disease. No intracranial mass, hemorrhage, or extra-axial fluid collection. No acute infarct evident. Areas of arterial vascular calcification. Mucosal thickening in several ethmoid air cells bilaterally. Electronically Signed   By: Lowella Grip III M.D.   On: 02/01/2017 12:51   Dg Chest Port 1 View  Result Date: 02/09/2017 CLINICAL DATA:  Abdominal pain 5 days with vomiting this morning. EXAM: PORTABLE CHEST 1 VIEW COMPARISON:  02/01/2017, 01/11/2015 and 10/09/2013 as well as chest CT 01/04/2017 FINDINGS: Lungs are adequately inflated without focal consolidation or effusion. Stable prominence of the right hilum. Cardiac silhouette is within normal. Prominence of the main pulmonary segment unchanged. Mild calcified plaque over the thoracic aorta. Degenerative change of the spine and shoulders. IMPRESSION: No acute cardiopulmonary disease. Findings suggesting pulmonary arterial hypertension. Aortic Atherosclerosis (ICD10-I70.0). Electronically Signed   By: Marin Olp M.D.   On: 02/09/2017 15:17       Subjective:  Wants to be discharged as soon as possible.  Discharge Exam: Vitals:   02/21/17 1900 02/22/17 0519  BP: (!) 149/73 (!) 148/65  Pulse: 73 62  Resp: 20 20  Temp: 97.7 F (36.5 C) (!) 97.5 F (36.4 C)   Vitals:   02/21/17 1013 02/21/17 1109 02/21/17 1900 02/22/17 0519  BP: (!) 140/56 (!) 117/49 (!) 149/73 (!) 148/65  Pulse: 65 (!) 58 73 62  Resp:  '20 20 20  '$ Temp:  98 F (36.7 C) 97.7 F (36.5 C) (!) 97.5 F (36.4 C)  TempSrc:  Oral Oral Oral  SpO2:  96% 95% 97%  Weight:      Height:        General: Pt is alert, awake, not in acute distress Cardiovascular: RRR, S1/S2 +, no rubs, no gallops Respiratory: CTA bilaterally, no wheezing, no rhonchi Abdominal: Soft, NT, ND, bowel sounds + Extremities: no  edema, no cyanosis    The results of significant diagnostics from this hospitalization (including imaging, microbiology, ancillary and laboratory) are listed below for reference.     Microbiology: Recent Results (from the past 240 hour(s))  MRSA PCR Screening     Status: None   Collection Time: 02/15/17  2:25 PM  Result Value Ref Range Status   MRSA by PCR NEGATIVE NEGATIVE Final    Comment:        The GeneXpert MRSA Assay (FDA approved for NASAL specimens only), is one component of a comprehensive MRSA colonization surveillance program. It is not intended to diagnose MRSA infection nor to guide or monitor treatment for MRSA infections.      Labs: BNP (last 3 results)  Recent Labs  01/04/17 1744 01/05/17 0357  BNP 899.1* 2,706.2*   Basic Metabolic Panel:  Recent Labs Lab 02/20/17 0710  NA 135  K 3.3*  CL 97*  CO2 27  GLUCOSE 154*  BUN 51*  CREATININE 3.40*  CALCIUM 9.8   Liver Function Tests:  Recent Labs Lab 02/20/17 0710  AST 14*  ALT  12*  ALKPHOS 117  BILITOT 0.6  PROT 5.7*  ALBUMIN 2.5*   No results for input(s): LIPASE, AMYLASE in the last 168 hours. No results for input(s): AMMONIA in the last 168 hours. CBC:  Recent Labs Lab 02/20/17 0710  WBC 12.4*  HGB 9.9*  HCT 29.3*  MCV 85.2  PLT 430*   Cardiac Enzymes: No results for input(s): CKTOTAL, CKMB, CKMBINDEX, TROPONINI in the last 168 hours. BNP: Invalid input(s): POCBNP CBG:  Recent Labs Lab 02/21/17 1107 02/21/17 1645 02/21/17 2104 02/22/17 0816 02/22/17 1158  GLUCAP 275* 158* 247* 162* 251*   D-Dimer No results for input(s): DDIMER in the last 72 hours. Hgb A1c No results for input(s): HGBA1C in the last 72 hours. Lipid Profile No results for input(s): CHOL, HDL, LDLCALC, TRIG, CHOLHDL, LDLDIRECT in the last 72 hours. Thyroid function studies No results for input(s): TSH, T4TOTAL, T3FREE, THYROIDAB in the last 72 hours.  Invalid input(s): FREET3 Anemia work  up No results for input(s): VITAMINB12, FOLATE, FERRITIN, TIBC, IRON, RETICCTPCT in the last 72 hours. Urinalysis    Component Value Date/Time   COLORURINE YELLOW 02/10/2017 0535   APPEARANCEUR CLOUDY (A) 02/10/2017 0535   LABSPEC 1.014 02/10/2017 0535   PHURINE 5.0 02/10/2017 0535   GLUCOSEU NEGATIVE 02/10/2017 0535   HGBUR NEGATIVE 02/10/2017 0535   BILIRUBINUR NEGATIVE 02/10/2017 0535   KETONESUR NEGATIVE 02/10/2017 0535   PROTEINUR 100 (A) 02/10/2017 0535   UROBILINOGEN 0.2 02/14/2015 1157   NITRITE NEGATIVE 02/10/2017 0535   LEUKOCYTESUR MODERATE (A) 02/10/2017 0535   Sepsis Labs Invalid input(s): PROCALCITONIN,  WBC,  LACTICIDVEN Microbiology Recent Results (from the past 240 hour(s))  MRSA PCR Screening     Status: None   Collection Time: 02/15/17  2:25 PM  Result Value Ref Range Status   MRSA by PCR NEGATIVE NEGATIVE Final    Comment:        The GeneXpert MRSA Assay (FDA approved for NASAL specimens only), is one component of a comprehensive MRSA colonization surveillance program. It is not intended to diagnose MRSA infection nor to guide or monitor treatment for MRSA infections.      Time coordinating discharge: Over 30 minutes  SIGNED:   Hosie Poisson, MD  [facility] 02/25/2017, 9:57 AM Pager   If 7PM-7AM, please contact night-coverage [email or web address] Password [email or web address]

## 2017-03-08 ENCOUNTER — Ambulatory Visit: Payer: Medicare Other | Admitting: Cardiovascular Disease

## 2017-03-18 ENCOUNTER — Other Ambulatory Visit: Payer: Self-pay | Admitting: Physician Assistant

## 2017-03-23 DEATH — deceased

## 2019-03-23 IMAGING — CT CT CHEST W/O CM
3 of 7 series · 14 of 36 positions shown, 16 images · non-contrast
Comparison: None.

CLINICAL DATA: Dizziness.  Abdominal pain with nausea and vomiting.

EXAM:
CT CHEST, ABDOMEN AND PELVIS WITHOUT CONTRAST
TECHNIQUE: Multidetector CT imaging of the chest, abdomen and pelvis was
performed following the standard protocol without IV contrast.

[Series 3: cap wo 5.0 i31f 2 · axial · 0.80mm/px · z∈[-737,-287]mm · 7 of 120 slices shown, 9 images]
[im 15/120  mediastinal]
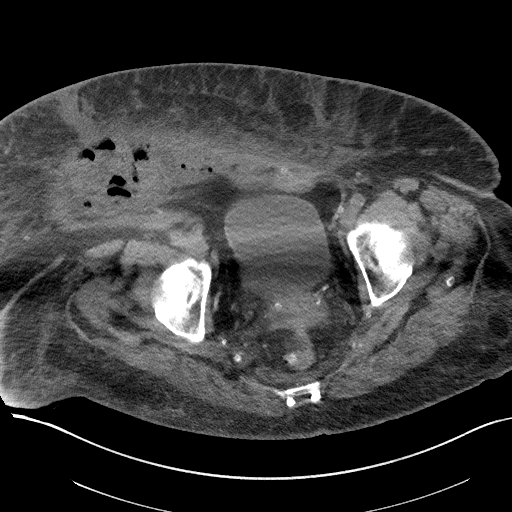
[im 15/120  lung]
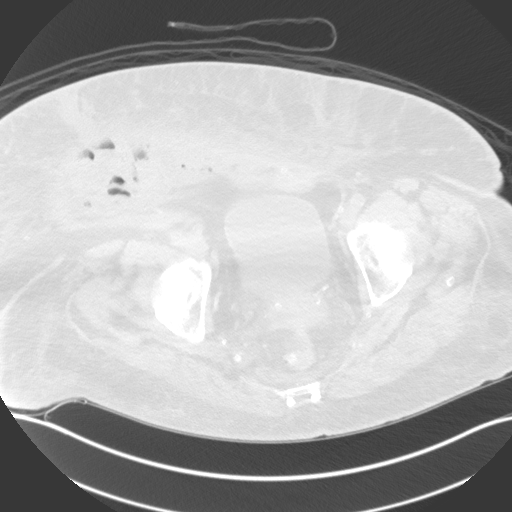
[im 30/120  lung]
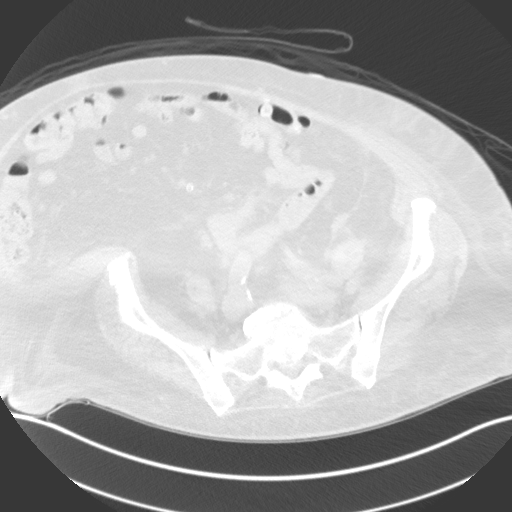
[im 45/120  lung]
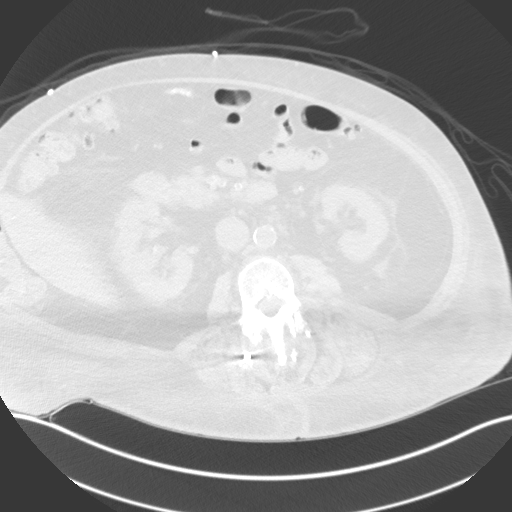
[im 60/120  lung]
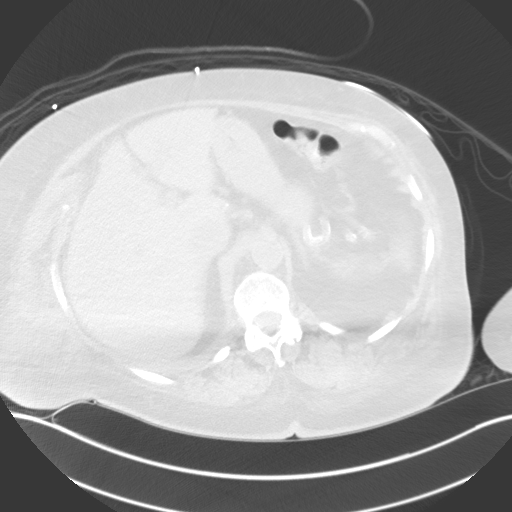
[im 75/120  mediastinal]
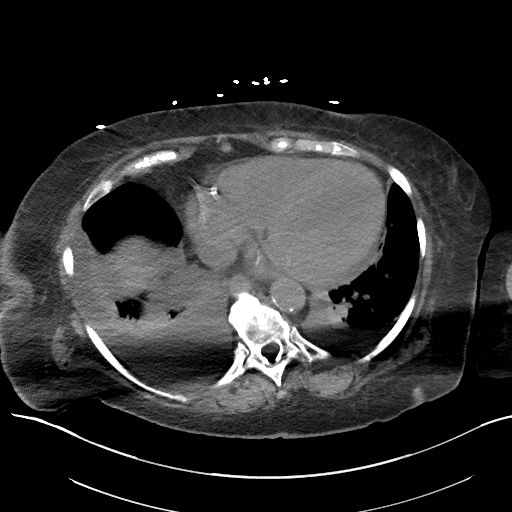
[im 75/120  lung]
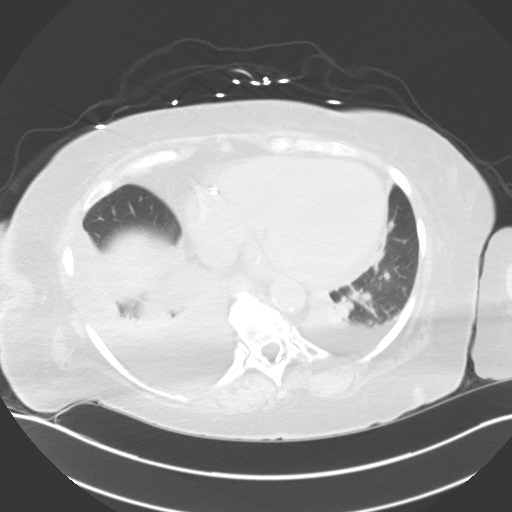
[im 90/120  lung]
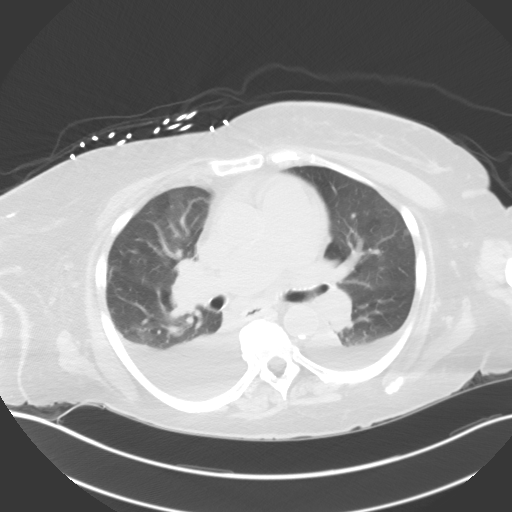
[im 105/120  lung]
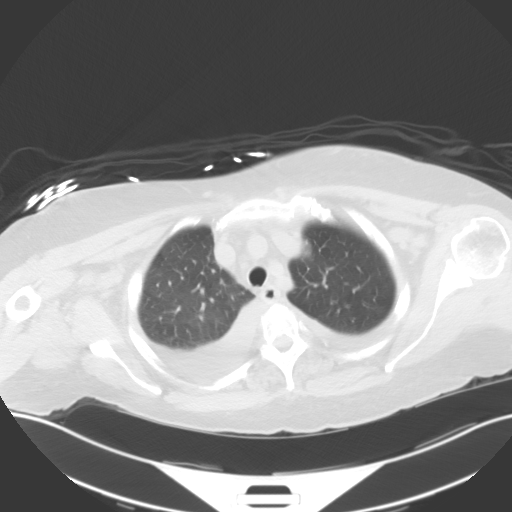

[Series 6: coronal · coronal · 0.83mm/px · 1 of 144 slices shown]
[im 72/144  lung]
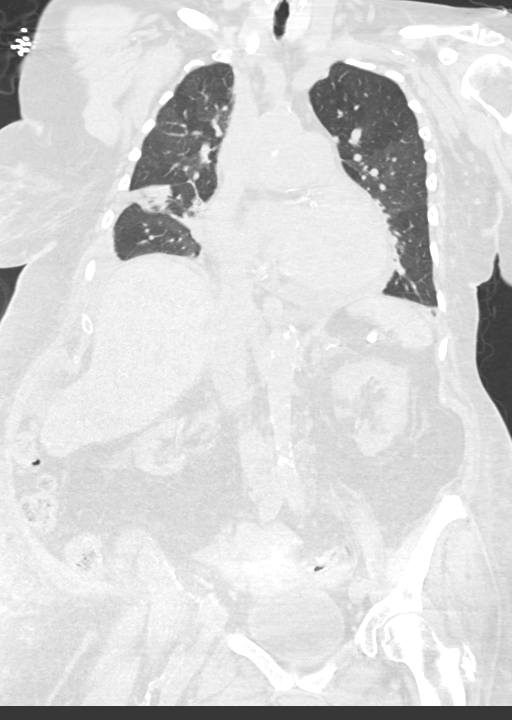

[Series 10: cap wo 5.0 mpr ax · axial · 0.70mm/px · z∈[-724,-299]mm · 6 of 119 slices shown]
[im 17/119  lung]
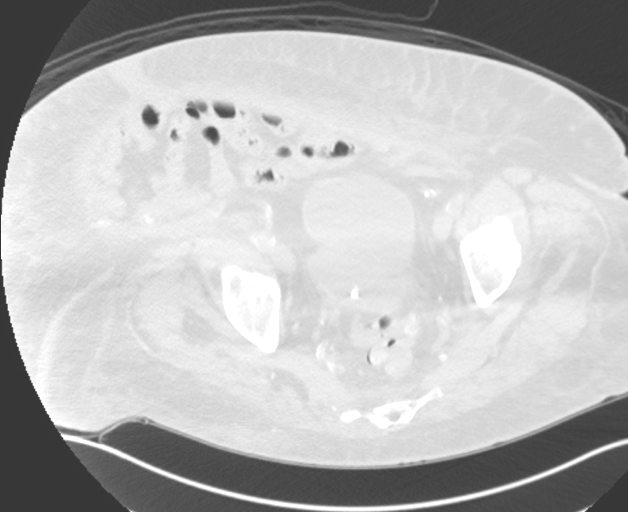
[im 34/119  lung]
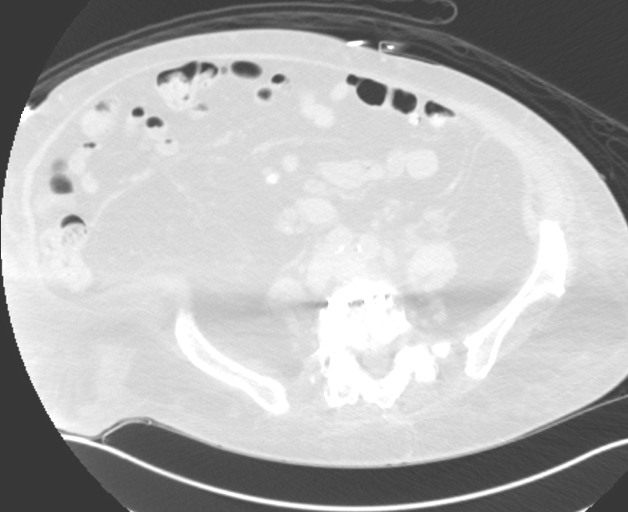
[im 51/119  lung]
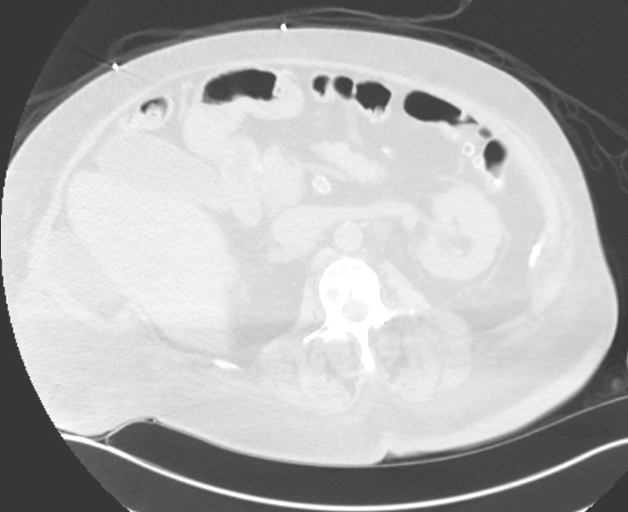
[im 68/119  lung]
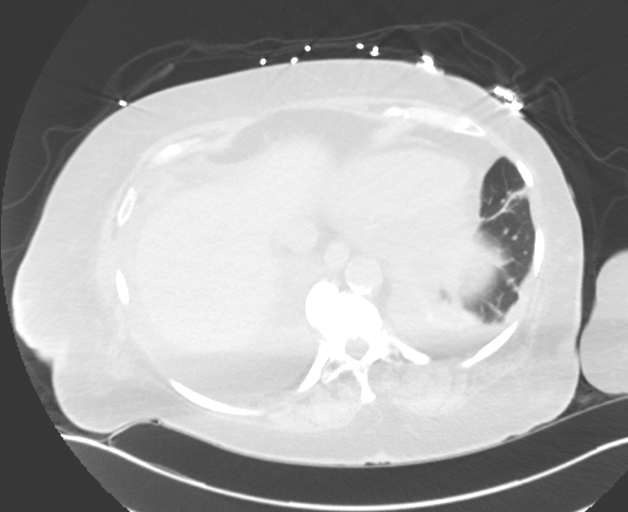
[im 85/119  lung]
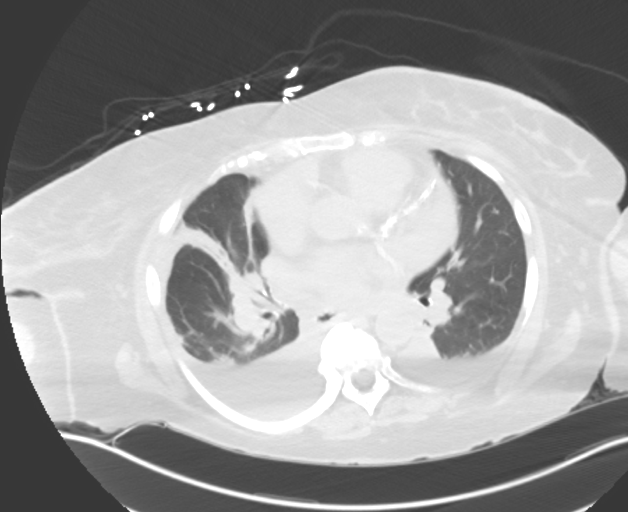
[im 102/119  lung]
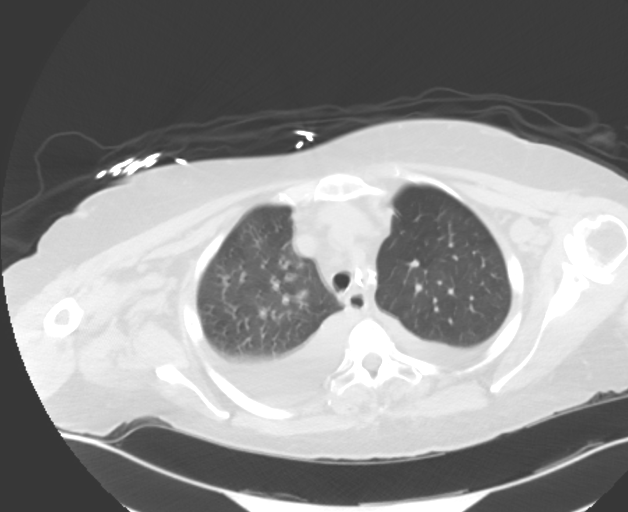

[14 of 36 positions shown; findings below may reference images not displayed]

FINDINGS: CT CHEST FINDINGS

Cardiovascular: Cardiomegaly with small volume pericardial fluid.
Enlarged main pulmonary artery at 4 cm, usually seen with pulmonary
hypertension. Diffuse atherosclerotic calcification of the
coronaries. Multifocal aortic and great vessel calcification. No
acute vascular finding.

Mediastinum/Nodes: Hilar enlargement it appears vascular. No
adenopathy noted. Negative esophagus.

Lungs/Pleura: Moderate layering pleural effusions, greater on the
right. Multi segment atelectasis. The aerated lung shows no edema.
No pneumothorax.

Musculoskeletal: See below

CT ABDOMEN PELVIS FINDINGS

Hepatobiliary: No focal liver abnormality.Subtle layering calculi.
No inflammatory changes in the biliary tree. Normal common bile duct
diameter.

Pancreas: Unremarkable.

Spleen: Unremarkable.

Adrenals/Urinary Tract: Negative adrenals. No hydronephrosis or
stone. Hilar calcifications appear atherosclerotic. Unremarkable
bladder.

Stomach/Bowel: No obstruction. No appendicitis. Extensive sigmoid
diverticulosis.

Vascular/Lymphatic: Diffuse atherosclerotic calcification. No noted
adenopathy. No mass or adenopathy.

Reproductive:Fibroid uterus with 4 cm calcified/ hyalinized fibroid.

Other: No ascites or pneumoperitoneum.  Anasarca.

Musculoskeletal: No acute finding. Severe lumbar facet arthropathy.
Status post L4-5 fusion. L5-S1 ankylosis or fusion. Extensive
thoracic spondylosis with multi-level ankylosis. Advanced
glenohumeral osteoarthritis.

Due to patient size, some of the abdominal wall is not visible.
IMPRESSION: 1. Cardiomegaly with volume overload. Moderate right and small left
pleural effusions with multi segment atelectasis. Extensive
anasarca.
2. Probable pulmonary hypertension.
3. Extensive colonic diverticulosis.
4. Cholelithiasis.

## 2019-03-26 IMAGING — CR DG CHEST 2V
2 series · 2 of 2 positions shown · non-contrast
Comparison: CT chest 01/04/2017 and chest radiograph 01/11/2015.

CLINICAL DATA: Chronic diastolic congestive heart failure.

EXAM:
CHEST  2 VIEW

[chest lat]
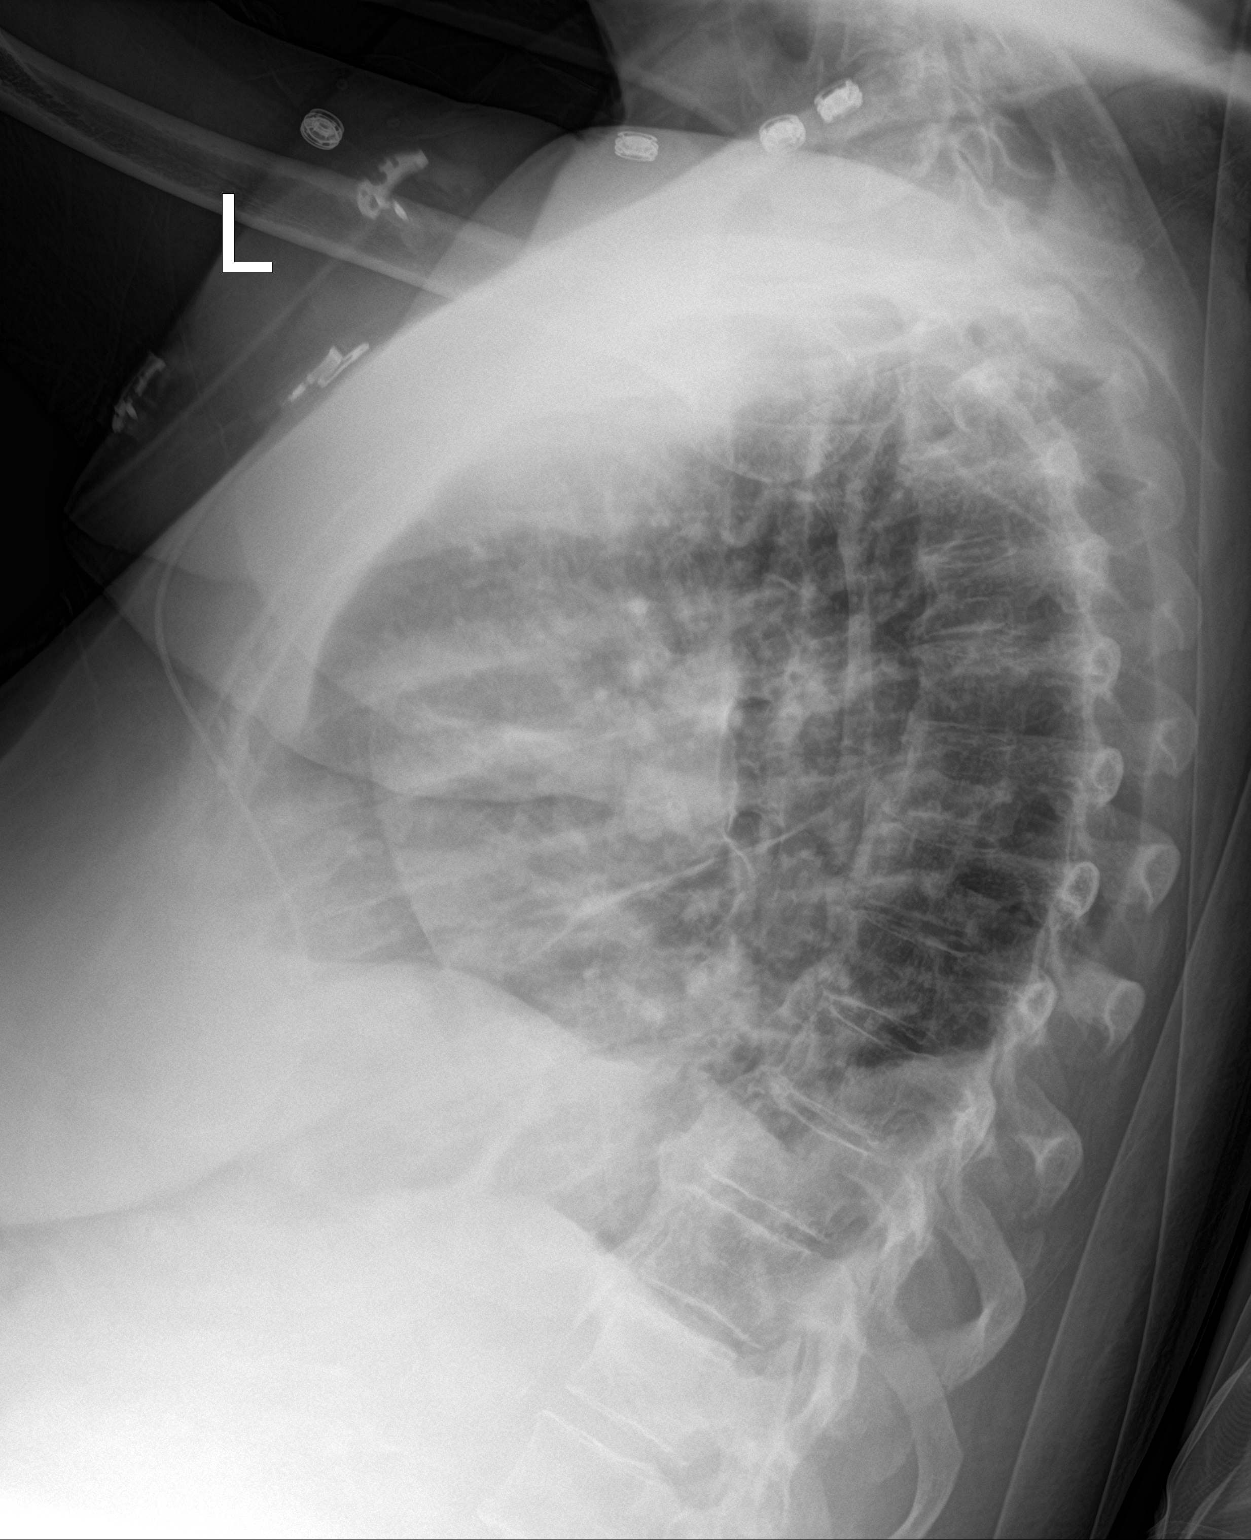

[chest ap]
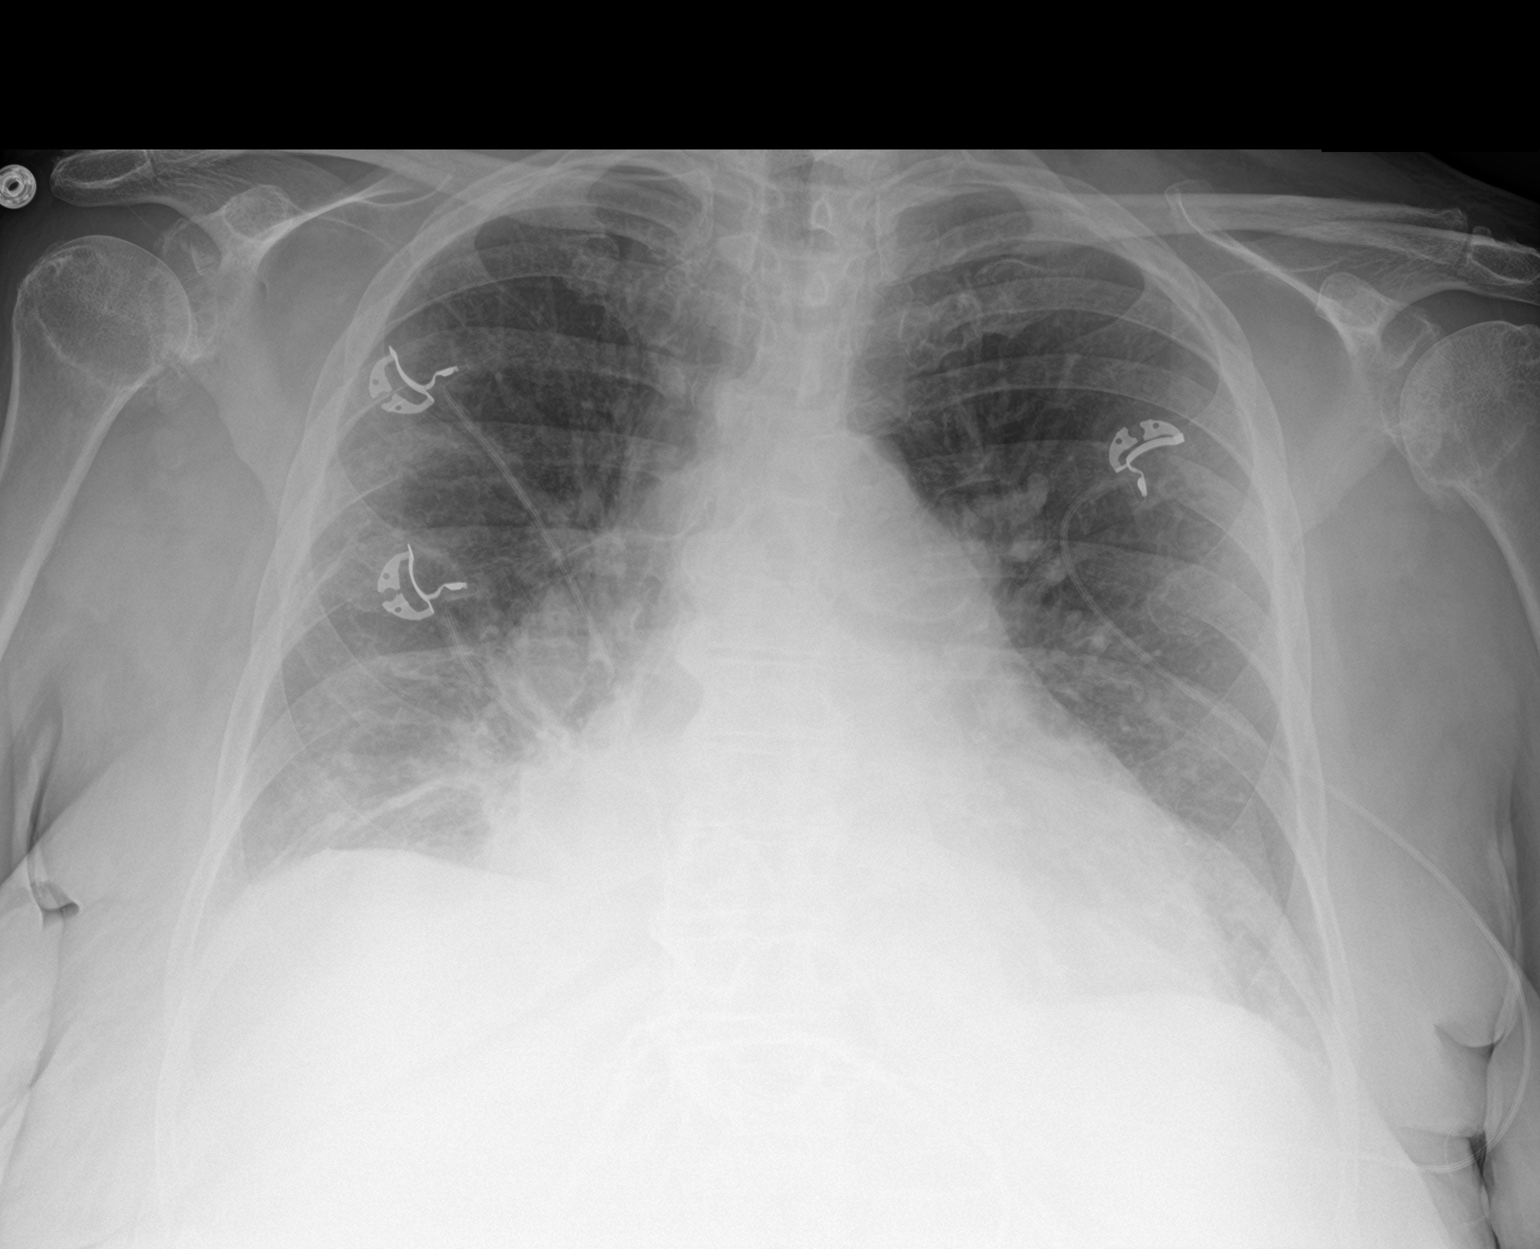

[2 of 2 positions shown; findings below may reference images not displayed]

FINDINGS: Trachea is midline. Heart is enlarged. Basilar dependent mixed
interstitial and airspace opacification with consolidation in the
right lower lobe. Small bilateral pleural effusions. Degenerative
changes in shoulders.
IMPRESSION: 1. Congestive heart failure.
2. Consolidation in the right lower lobe. Pneumonia cannot be
excluded.

## 2019-06-11 IMAGING — US US ABDOMEN LIMITED
1 series · 14 of 25 positions shown · non-contrast
Comparison: CT scan October 05, 2014

CLINICAL DATA: Abnormal liver functions

EXAM:
ULTRASOUND ABDOMEN LIMITED RIGHT UPPER QUADRANT

[Series 1: us abdomen limited · 0.33mm/px · 14 of 48 slices shown]
[im 1/48]
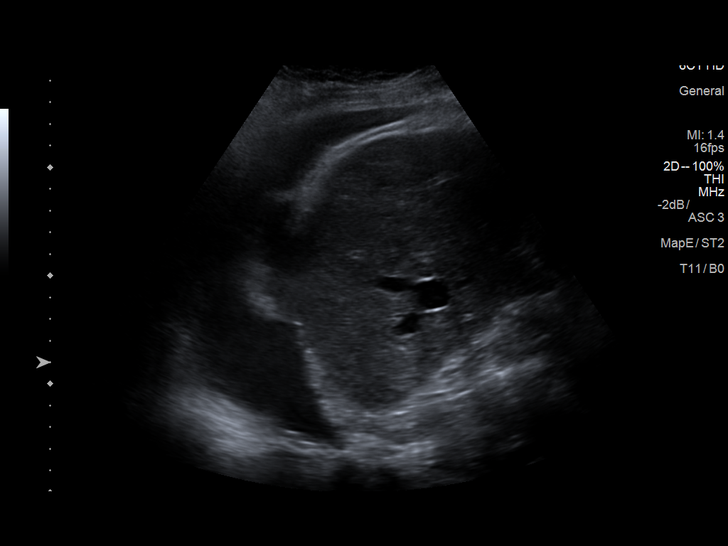
[im 4/48]
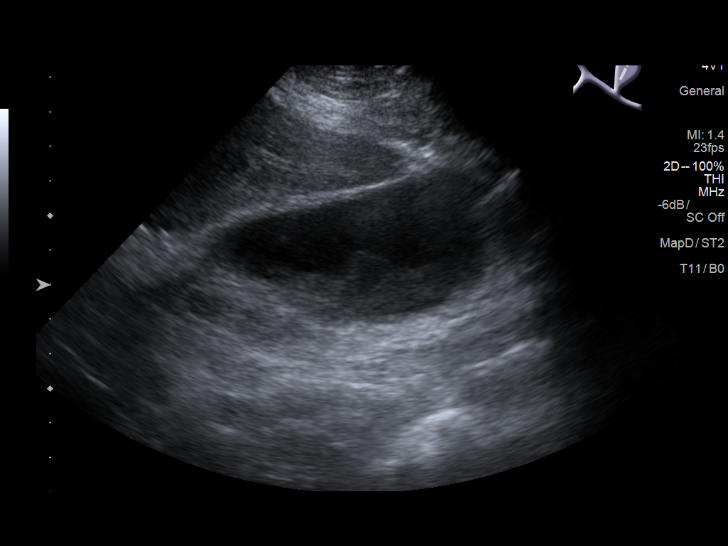
[im 8/48]
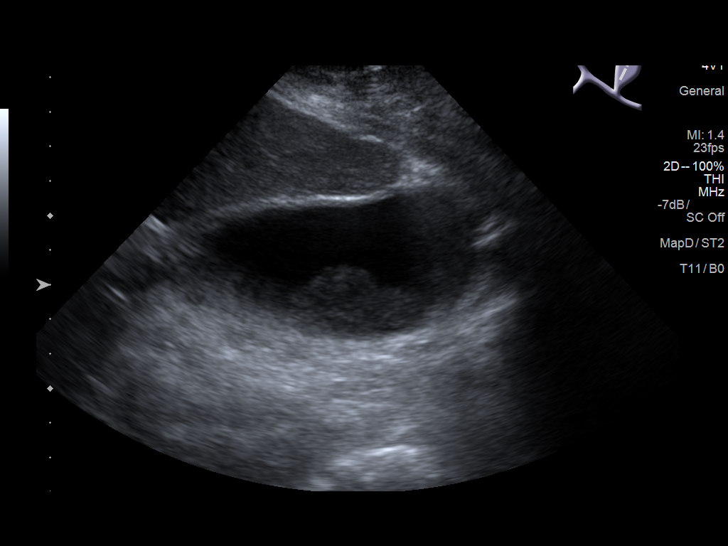
[im 12/48]
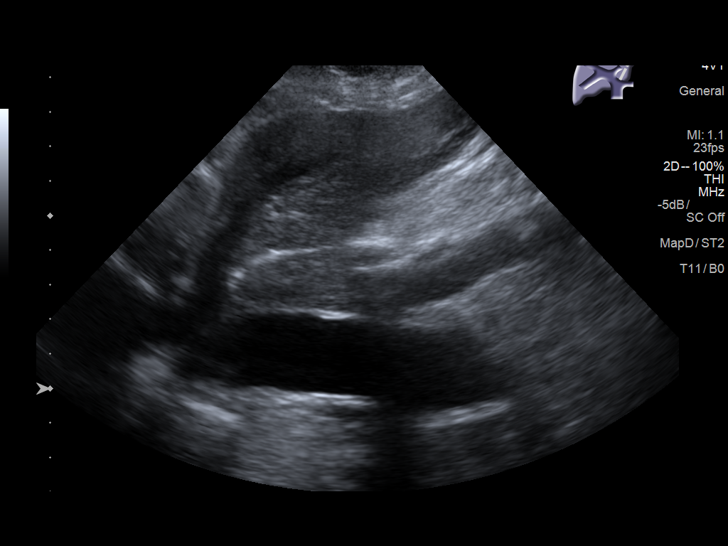
[im 16/48]
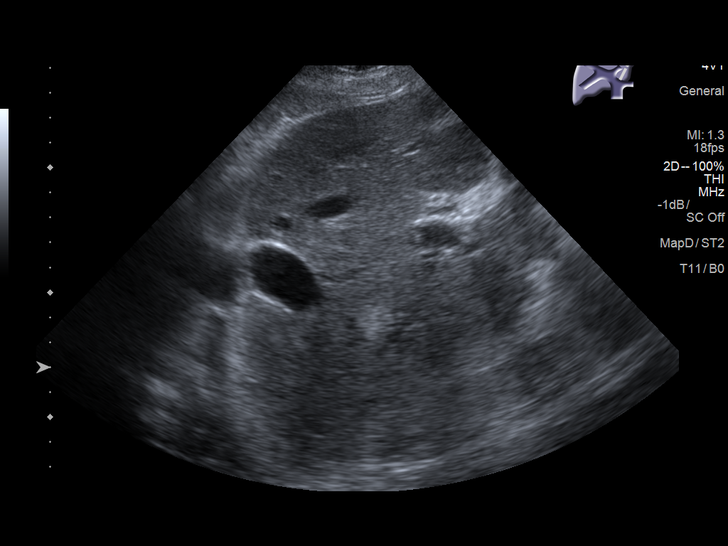
[im 18/48]
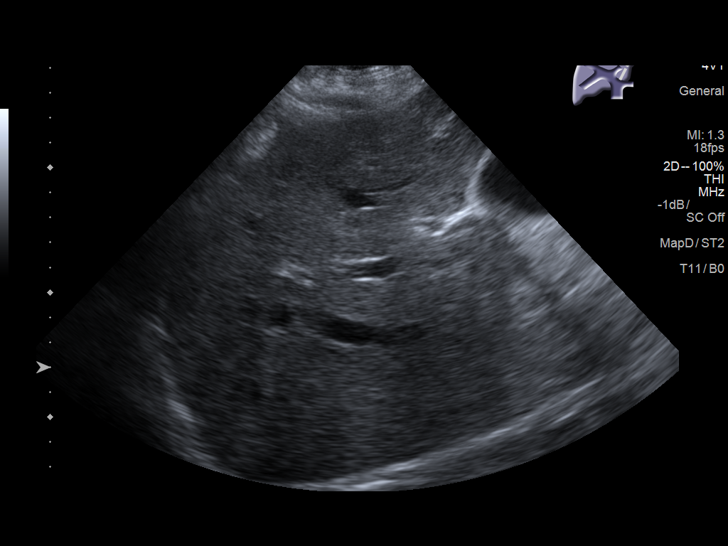
[im 22/48]
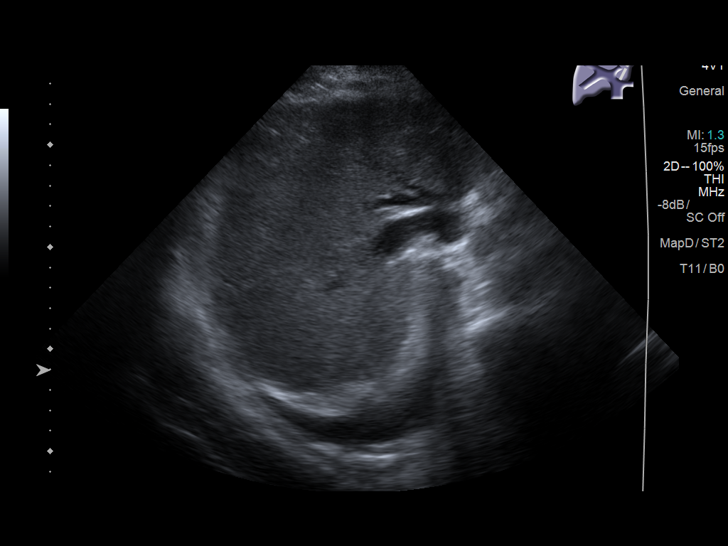
[im 26/48]
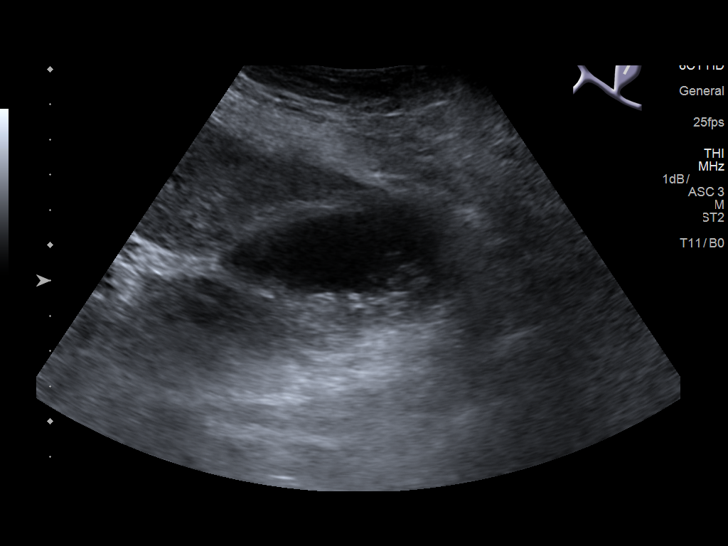
[im 30/48]
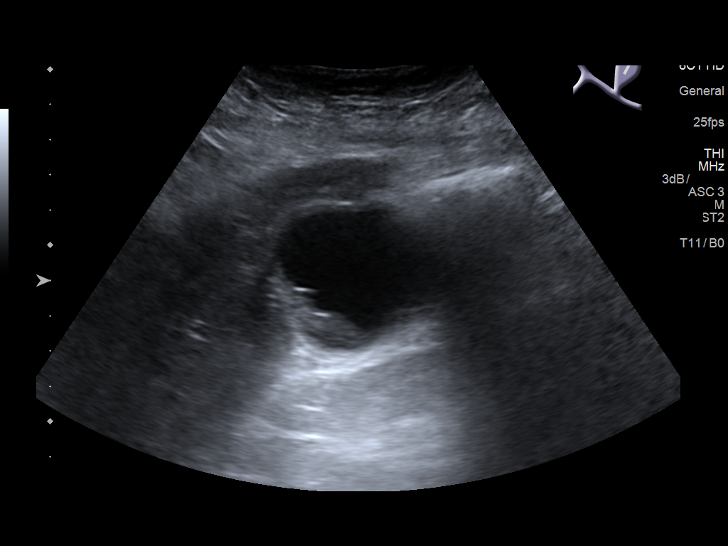
[im 32/48]
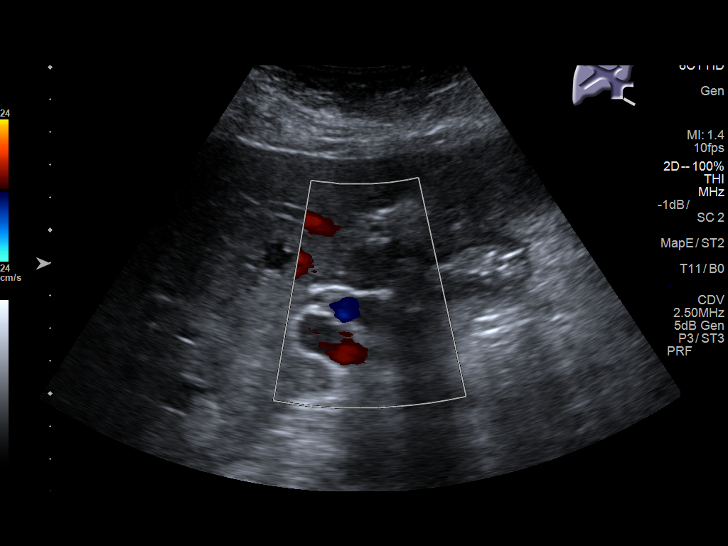
[im 36/48]
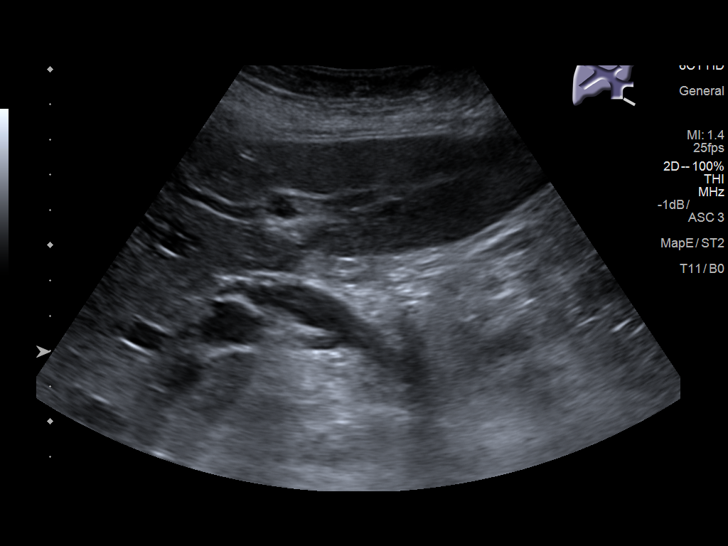
[im 40/48]
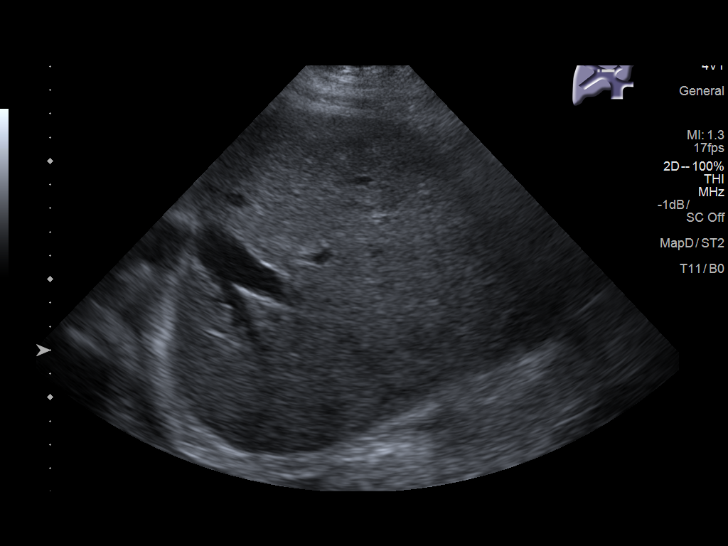
[im 44/48]
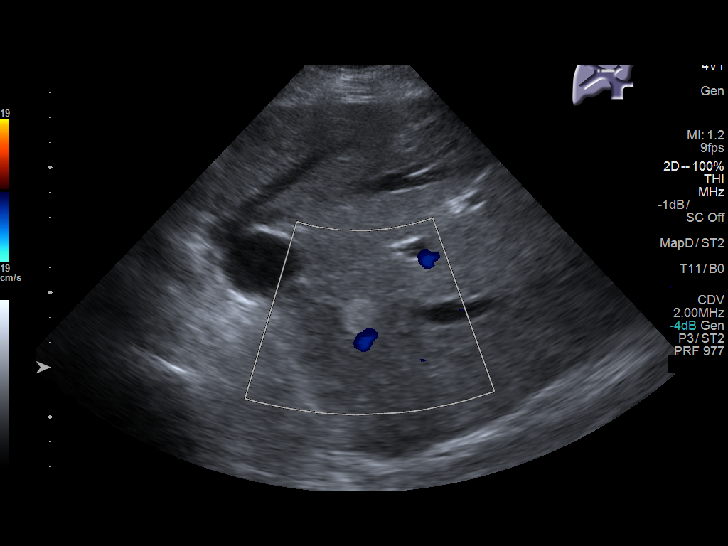
[im 48/48]
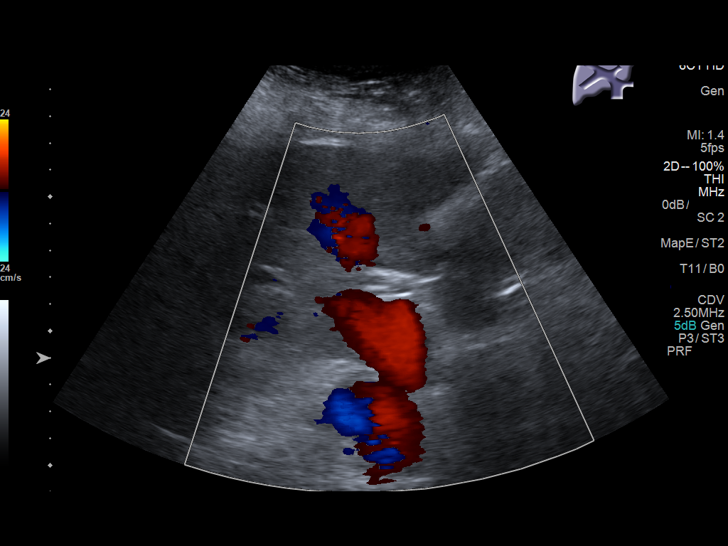

[14 of 25 positions shown; findings below may reference images not displayed]

FINDINGS: Gallbladder:

Sludge is seen in the gallbladder with no shadowing to definitively
suggest stones. No Murphy's sign or pericholecystic fluid. The
gallbladder wall measures up to 3.5 mm.

Common bile duct:

Diameter: 9 mm

Liver:

An echogenic mass in the liver measures up to 16 mm. No definitive
correlate on the October 05, 2014 CT scan.

Incidentally, a right pleural effusion is identified.
IMPRESSION: 1. Significant sludge in the gallbladder. No definitive stones are
visualized today but suspected small stones were seen on a CT scan
in 7530. The gallbladder wall is mildly thickened. No Murphy's sign
or pericholecystic fluid identified.
2. The common bile duct is dilated measuring up to 9 mm. Consider an
MRCP to evaluate for choledocholithiasis.
